# Patient Record
Sex: Female | Born: 1960
Health system: Southern US, Community
[De-identification: ages and names within clinical notes are randomized; demographics above are authoritative.]

## PROBLEM LIST (undated history)

## (undated) DIAGNOSIS — I4719 Other supraventricular tachycardia: Secondary | ICD-10-CM

## (undated) DIAGNOSIS — T7840XA Allergy, unspecified, initial encounter: Secondary | ICD-10-CM

## (undated) DIAGNOSIS — M545 Low back pain, unspecified: Secondary | ICD-10-CM

## (undated) DIAGNOSIS — E785 Hyperlipidemia, unspecified: Secondary | ICD-10-CM

## (undated) DIAGNOSIS — R739 Hyperglycemia, unspecified: Secondary | ICD-10-CM

## (undated) DIAGNOSIS — I341 Nonrheumatic mitral (valve) prolapse: Secondary | ICD-10-CM

## (undated) DIAGNOSIS — M2550 Pain in unspecified joint: Secondary | ICD-10-CM

## (undated) DIAGNOSIS — R7303 Prediabetes: Secondary | ICD-10-CM

## (undated) DIAGNOSIS — I77819 Aortic ectasia, unspecified site: Secondary | ICD-10-CM

## (undated) DIAGNOSIS — R252 Cramp and spasm: Secondary | ICD-10-CM

## (undated) DIAGNOSIS — G4730 Sleep apnea, unspecified: Secondary | ICD-10-CM

## (undated) DIAGNOSIS — I509 Heart failure, unspecified: Secondary | ICD-10-CM

## (undated) DIAGNOSIS — K219 Gastro-esophageal reflux disease without esophagitis: Secondary | ICD-10-CM

## (undated) DIAGNOSIS — R079 Chest pain, unspecified: Secondary | ICD-10-CM

## (undated) DIAGNOSIS — F32A Depression, unspecified: Secondary | ICD-10-CM

## (undated) DIAGNOSIS — I77810 Thoracic aortic ectasia: Secondary | ICD-10-CM

## (undated) DIAGNOSIS — E119 Type 2 diabetes mellitus without complications: Secondary | ICD-10-CM

## (undated) DIAGNOSIS — R0600 Dyspnea, unspecified: Secondary | ICD-10-CM

## (undated) DIAGNOSIS — M25569 Pain in unspecified knee: Secondary | ICD-10-CM

## (undated) DIAGNOSIS — F419 Anxiety disorder, unspecified: Secondary | ICD-10-CM

## (undated) DIAGNOSIS — I340 Nonrheumatic mitral (valve) insufficiency: Secondary | ICD-10-CM

## (undated) DIAGNOSIS — F329 Major depressive disorder, single episode, unspecified: Secondary | ICD-10-CM

## (undated) DIAGNOSIS — I10 Essential (primary) hypertension: Secondary | ICD-10-CM

## (undated) DIAGNOSIS — I359 Nonrheumatic aortic valve disorder, unspecified: Secondary | ICD-10-CM

## (undated) DIAGNOSIS — I272 Pulmonary hypertension, unspecified: Secondary | ICD-10-CM

## (undated) DIAGNOSIS — M1990 Unspecified osteoarthritis, unspecified site: Secondary | ICD-10-CM

## (undated) HISTORY — DX: Other supraventricular tachycardia: I47.19

## (undated) HISTORY — DX: Hyperglycemia, unspecified: R73.9

## (undated) HISTORY — PX: COLONOSCOPY: SHX174

## (undated) HISTORY — DX: Prediabetes: R73.03

## (undated) HISTORY — DX: Major depressive disorder, single episode, unspecified: F32.9

## (undated) HISTORY — PX: TUBAL LIGATION: SHX77

## (undated) HISTORY — DX: Pain in unspecified joint: M25.50

## (undated) HISTORY — DX: Low back pain: M54.5

## (undated) HISTORY — DX: Nonrheumatic mitral (valve) prolapse: I34.1

## (undated) HISTORY — PX: BREAST BIOPSY: SHX20

## (undated) HISTORY — DX: Type 2 diabetes mellitus without complications: E11.9

## (undated) HISTORY — DX: Gastro-esophageal reflux disease without esophagitis: K21.9

## (undated) HISTORY — DX: Allergy, unspecified, initial encounter: T78.40XA

## (undated) HISTORY — DX: Unspecified osteoarthritis, unspecified site: M19.90

## (undated) HISTORY — DX: Essential (primary) hypertension: I10

## (undated) HISTORY — DX: Hyperlipidemia, unspecified: E78.5

## (undated) HISTORY — DX: Aortic ectasia, unspecified site: I77.819

## (undated) HISTORY — DX: Heart failure, unspecified: I50.9

## (undated) HISTORY — DX: Anxiety disorder, unspecified: F41.9

## (undated) HISTORY — DX: Chest pain, unspecified: R07.9

## (undated) HISTORY — PX: BACK SURGERY: SHX140

## (undated) HISTORY — DX: Thoracic aortic ectasia: I77.810

## (undated) HISTORY — DX: Depression, unspecified: F32.A

## (undated) HISTORY — DX: Low back pain, unspecified: M54.50

## (undated) HISTORY — DX: Pulmonary hypertension, unspecified: I27.20

## (undated) HISTORY — DX: Pain in unspecified knee: M25.569

## (undated) HISTORY — DX: Nonrheumatic aortic valve disorder, unspecified: I35.9

## (undated) HISTORY — DX: Cramp and spasm: R25.2

## (undated) HISTORY — DX: Nonrheumatic mitral (valve) insufficiency: I34.0

---

## 1997-04-21 ENCOUNTER — Other Ambulatory Visit: Admission: RE | Admit: 1997-04-21 | Discharge: 1997-04-21 | Payer: Self-pay | Admitting: Obstetrics and Gynecology

## 1997-05-14 ENCOUNTER — Inpatient Hospital Stay (HOSPITAL_COMMUNITY): Admission: AD | Admit: 1997-05-14 | Discharge: 1997-05-14 | Payer: Self-pay | Admitting: Obstetrics & Gynecology

## 1997-05-17 ENCOUNTER — Inpatient Hospital Stay (HOSPITAL_COMMUNITY): Admission: AD | Admit: 1997-05-17 | Discharge: 1997-05-20 | Payer: Self-pay | Admitting: Obstetrics and Gynecology

## 1997-08-16 ENCOUNTER — Other Ambulatory Visit: Admission: RE | Admit: 1997-08-16 | Discharge: 1997-08-16 | Payer: Self-pay | Admitting: Obstetrics and Gynecology

## 2000-02-20 ENCOUNTER — Other Ambulatory Visit: Admission: RE | Admit: 2000-02-20 | Discharge: 2000-02-20 | Payer: Self-pay | Admitting: Obstetrics and Gynecology

## 2000-08-12 ENCOUNTER — Other Ambulatory Visit: Admission: RE | Admit: 2000-08-12 | Discharge: 2000-08-12 | Payer: Self-pay | Admitting: Obstetrics and Gynecology

## 2001-09-30 ENCOUNTER — Other Ambulatory Visit: Admission: RE | Admit: 2001-09-30 | Discharge: 2001-09-30 | Payer: Self-pay | Admitting: Obstetrics and Gynecology

## 2001-10-07 ENCOUNTER — Encounter: Payer: Self-pay | Admitting: Obstetrics and Gynecology

## 2001-10-07 ENCOUNTER — Encounter: Admission: RE | Admit: 2001-10-07 | Discharge: 2001-10-07 | Payer: Self-pay | Admitting: Obstetrics and Gynecology

## 2001-10-08 ENCOUNTER — Ambulatory Visit (HOSPITAL_COMMUNITY): Admission: RE | Admit: 2001-10-08 | Discharge: 2001-10-08 | Payer: Self-pay | Admitting: Obstetrics and Gynecology

## 2001-10-08 ENCOUNTER — Encounter: Payer: Self-pay | Admitting: Obstetrics and Gynecology

## 2002-10-09 ENCOUNTER — Encounter: Admission: RE | Admit: 2002-10-09 | Discharge: 2002-10-09 | Payer: Self-pay | Admitting: Obstetrics and Gynecology

## 2002-10-09 ENCOUNTER — Encounter: Payer: Self-pay | Admitting: Obstetrics and Gynecology

## 2003-04-07 ENCOUNTER — Other Ambulatory Visit: Admission: RE | Admit: 2003-04-07 | Discharge: 2003-04-07 | Payer: Self-pay | Admitting: Obstetrics and Gynecology

## 2003-11-05 ENCOUNTER — Encounter: Admission: RE | Admit: 2003-11-05 | Discharge: 2003-11-05 | Payer: Self-pay | Admitting: Obstetrics and Gynecology

## 2004-01-27 ENCOUNTER — Ambulatory Visit: Payer: Self-pay | Admitting: Internal Medicine

## 2004-02-10 ENCOUNTER — Ambulatory Visit: Payer: Self-pay | Admitting: Internal Medicine

## 2004-05-04 ENCOUNTER — Other Ambulatory Visit: Admission: RE | Admit: 2004-05-04 | Discharge: 2004-05-04 | Payer: Self-pay | Admitting: Obstetrics and Gynecology

## 2004-11-10 ENCOUNTER — Encounter: Admission: RE | Admit: 2004-11-10 | Discharge: 2004-11-10 | Payer: Self-pay | Admitting: Obstetrics and Gynecology

## 2004-11-17 ENCOUNTER — Ambulatory Visit: Payer: Self-pay | Admitting: Internal Medicine

## 2004-12-26 ENCOUNTER — Ambulatory Visit: Payer: Self-pay | Admitting: Internal Medicine

## 2005-01-18 ENCOUNTER — Ambulatory Visit: Payer: Self-pay | Admitting: Internal Medicine

## 2005-01-25 ENCOUNTER — Ambulatory Visit: Payer: Self-pay | Admitting: Internal Medicine

## 2005-05-08 ENCOUNTER — Other Ambulatory Visit: Admission: RE | Admit: 2005-05-08 | Discharge: 2005-05-08 | Payer: Self-pay | Admitting: Obstetrics and Gynecology

## 2005-11-07 ENCOUNTER — Emergency Department (HOSPITAL_COMMUNITY): Admission: EM | Admit: 2005-11-07 | Discharge: 2005-11-07 | Payer: Self-pay | Admitting: Family Medicine

## 2005-11-23 ENCOUNTER — Encounter: Admission: RE | Admit: 2005-11-23 | Discharge: 2005-11-23 | Payer: Self-pay | Admitting: Obstetrics and Gynecology

## 2006-04-19 ENCOUNTER — Ambulatory Visit (HOSPITAL_COMMUNITY): Admission: RE | Admit: 2006-04-19 | Discharge: 2006-04-20 | Payer: Self-pay | Admitting: Orthopedic Surgery

## 2006-09-05 DIAGNOSIS — G43909 Migraine, unspecified, not intractable, without status migrainosus: Secondary | ICD-10-CM | POA: Insufficient documentation

## 2006-09-05 DIAGNOSIS — J309 Allergic rhinitis, unspecified: Secondary | ICD-10-CM | POA: Insufficient documentation

## 2006-09-05 DIAGNOSIS — E669 Obesity, unspecified: Secondary | ICD-10-CM

## 2006-11-26 ENCOUNTER — Encounter: Admission: RE | Admit: 2006-11-26 | Discharge: 2006-11-26 | Payer: Self-pay | Admitting: Obstetrics and Gynecology

## 2006-12-02 LAB — CONVERTED CEMR LAB: Pap Smear: NORMAL

## 2007-01-13 ENCOUNTER — Ambulatory Visit: Payer: Self-pay | Admitting: Internal Medicine

## 2007-01-13 LAB — CONVERTED CEMR LAB
BUN: 7 mg/dL (ref 6–23)
Basophils Absolute: 0.1 10*3/uL (ref 0.0–0.1)
Basophils Relative: 1.4 % — ABNORMAL HIGH (ref 0.0–1.0)
Bilirubin, Direct: 0.2 mg/dL (ref 0.0–0.3)
Calcium: 9.5 mg/dL (ref 8.4–10.5)
Chloride: 106 meq/L (ref 96–112)
Cholesterol: 185 mg/dL (ref 0–200)
GFR calc Af Amer: 87 mL/min
HCT: 37.9 % (ref 36.0–46.0)
HDL: 43.2 mg/dL (ref >39.0)
Ketones, urine, test strip: NEGATIVE
LDL Cholesterol: 127 mg/dL — ABNORMAL HIGH (ref 0–99)
MCV: 87.7 fL (ref 78.0–100.0)
Monocytes Absolute: 0.3 10*3/uL (ref 0.2–0.7)
Neutro Abs: 3 10*3/uL (ref 1.4–7.7)
Neutrophils Relative %: 55.5 % (ref 43.0–77.0)
Nitrite: NEGATIVE
RBC: 4.32 M/uL (ref 3.87–5.11)
Specific Gravity, Urine: 1.025
TSH: 2.53 microintl units/mL (ref 0.35–5.50)
Total Bilirubin: 0.8 mg/dL (ref 0.3–1.2)
Total CHOL/HDL Ratio: 4.3
VLDL: 15 mg/dL (ref 0–40)
WBC: 5.4 10*3/uL (ref 4.5–10.5)
pH: 6

## 2007-01-20 ENCOUNTER — Ambulatory Visit: Payer: Self-pay | Admitting: Internal Medicine

## 2007-01-20 DIAGNOSIS — I10 Essential (primary) hypertension: Secondary | ICD-10-CM

## 2007-04-28 ENCOUNTER — Ambulatory Visit: Payer: Self-pay | Admitting: Internal Medicine

## 2007-08-25 ENCOUNTER — Ambulatory Visit: Payer: Self-pay | Admitting: Internal Medicine

## 2007-09-23 ENCOUNTER — Ambulatory Visit: Payer: Self-pay | Admitting: Internal Medicine

## 2007-09-23 DIAGNOSIS — M549 Dorsalgia, unspecified: Secondary | ICD-10-CM | POA: Insufficient documentation

## 2007-12-01 ENCOUNTER — Encounter: Admission: RE | Admit: 2007-12-01 | Discharge: 2007-12-01 | Payer: Self-pay | Admitting: Obstetrics and Gynecology

## 2007-12-09 ENCOUNTER — Telehealth: Payer: Self-pay | Admitting: Internal Medicine

## 2007-12-12 ENCOUNTER — Telehealth: Payer: Self-pay | Admitting: Internal Medicine

## 2008-02-17 ENCOUNTER — Ambulatory Visit: Payer: Self-pay | Admitting: Internal Medicine

## 2008-02-18 LAB — CONVERTED CEMR LAB
BUN: 10 mg/dL (ref 6–23)
Chloride: 109 meq/L (ref 96–112)
Glucose, Bld: 107 mg/dL — ABNORMAL HIGH (ref 70–99)
Sodium: 141 meq/L (ref 135–145)

## 2008-03-12 ENCOUNTER — Telehealth: Payer: Self-pay | Admitting: Internal Medicine

## 2008-03-23 ENCOUNTER — Ambulatory Visit: Payer: Self-pay | Admitting: Internal Medicine

## 2008-05-03 ENCOUNTER — Telehealth: Payer: Self-pay | Admitting: Internal Medicine

## 2008-08-04 ENCOUNTER — Encounter: Admission: RE | Admit: 2008-08-04 | Discharge: 2008-08-04 | Payer: Self-pay | Admitting: Interventional Radiology

## 2008-08-04 ENCOUNTER — Encounter: Payer: Self-pay | Admitting: Internal Medicine

## 2008-08-11 ENCOUNTER — Ambulatory Visit (HOSPITAL_COMMUNITY): Admission: RE | Admit: 2008-08-11 | Discharge: 2008-08-11 | Payer: Self-pay | Admitting: Interventional Radiology

## 2008-08-27 ENCOUNTER — Ambulatory Visit (HOSPITAL_COMMUNITY): Admission: RE | Admit: 2008-08-27 | Discharge: 2008-08-28 | Payer: Self-pay | Admitting: Interventional Radiology

## 2008-09-22 ENCOUNTER — Encounter: Admission: RE | Admit: 2008-09-22 | Discharge: 2008-09-22 | Payer: Self-pay | Admitting: Interventional Radiology

## 2008-12-01 ENCOUNTER — Encounter: Admission: RE | Admit: 2008-12-01 | Discharge: 2008-12-01 | Payer: Self-pay | Admitting: Obstetrics and Gynecology

## 2009-03-01 ENCOUNTER — Ambulatory Visit (HOSPITAL_COMMUNITY): Admission: RE | Admit: 2009-03-01 | Discharge: 2009-03-01 | Payer: Self-pay | Admitting: Interventional Radiology

## 2009-03-01 ENCOUNTER — Encounter: Admission: RE | Admit: 2009-03-01 | Discharge: 2009-03-01 | Payer: Self-pay | Admitting: Interventional Radiology

## 2009-10-03 ENCOUNTER — Ambulatory Visit: Payer: Self-pay | Admitting: Internal Medicine

## 2009-12-02 ENCOUNTER — Encounter: Admission: RE | Admit: 2009-12-02 | Discharge: 2009-12-02 | Payer: Self-pay | Admitting: Obstetrics and Gynecology

## 2010-01-21 ENCOUNTER — Encounter: Payer: Self-pay | Admitting: Internal Medicine

## 2010-01-22 ENCOUNTER — Encounter: Payer: Self-pay | Admitting: Obstetrics and Gynecology

## 2010-01-23 ENCOUNTER — Encounter: Payer: Self-pay | Admitting: Interventional Radiology

## 2010-01-31 NOTE — Assessment & Plan Note (Signed)
Summary: BACK PAIN // RS   Vital Signs:  Patient profile:   50 year old female Weight:      262 pounds Temp:     99.2 degrees F oral BP sitting:   140 / 100  (left arm) Cuff size:   large  Vitals Entered By: Sid Falcon LPN (October 03, 2009 11:26 AM)  History of Present Illness: LOW BACK PAIN---ONSET SEVERAL DAYS AGO yesterday pain was severe--much better today minmal r leg pain associated with LBP  no weakness she has a hx of LBP--- hx of back surgery 2008 (dr brooks)  All other systems reviewed and were negative   Current Problems (verified): 1)  Back Pain  (ICD-724.5) 2)  Elevated Blood Pressure Without Diagnosis of Hypertension  (ICD-796.2) 3)  Preventive Health Care  (ICD-V70.0) 4)  Migraine Headache  (ICD-346.90) 5)  Obesity  (ICD-278.00) 6)  Allergic Rhinitis  (ICD-477.9)  Current Medications (verified): 1)  None  Allergies (verified): No Known Drug Allergies  Past History:  Past Medical History: Last updated: 09/05/2006 Allergic rhinitis obese migraine headache SBE prophylaxis--rescued by CV  Past Surgical History: Last updated: 01/20/2007 Tubal ligation CB X1--C-section breast sx--biopsy, bilateral back surgery-4/1/8-pt's report (dr brooks).  Family History: Last updated: 01/20/2007 Family History High cholesterol-mother, brother Family History Hypertension mother , brother Family History of Stroke M 1st degree relative <50-father deceased age 86  Social History: Last updated: 03/23/2008 Married Former Smoker Alcohol use-yes works at [employer]  Risk Factors: Smoking Status: quit (01/20/2007)  Physical Exam  General:  alert and well-developed.   Head:  normocephalic and atraumatic.   Eyes:  pupils equal and pupils round.   Ears:  R ear normal and L ear normal.   Neck:  No deformities, masses, or tenderness noted. Lungs:  normal respiratory effort and no intercostal retractions.   Heart:  normal rate and regular rhythm.     Abdomen:  soft and non-tender.   Msk:  SLR negative  Neurologic:  dtrs at ankles and knees are -2/-2 but symmetric Skin:  turgor normal and color normal.   Psych:  good eye contact and not anxious appearing.     Impression & Recommendations:  Problem # 1:  BACK PAIN (ICD-724.5) suspect muscle spasm---will try NSAIDS muscle relaxer  call if sxs persist  instructions for back exercises  Complete Medication List: 1)  Meloxicam 15 Mg Tabs (Meloxicam) .... One by mouth daily with food 2)  Cyclobenzaprine Hcl 10 Mg Tabs (Cyclobenzaprine hcl) .Marland Kitchen.. 1 by mouth 2 times daily as needed for back pain  Patient Instructions: 1)  . Prescriptions: CYCLOBENZAPRINE HCL 10 MG  TABS (CYCLOBENZAPRINE HCL) 1 by mouth 2 times daily as needed for back pain  #20 x 0   Entered and Authorized by:   Birdie Sons MD   Signed by:   Birdie Sons MD on 10/03/2009   Method used:   [facility]        [facility] [address] [phone]* (retail)       [address]       [place], [place]  [address]       Ph: [phone] or [phone]       Fax: [phone]   RxID:   [id number] MELOXICAM 15 MG TABS (MELOXICAM) one by mouth daily with food  #30 x 0   Entered and Authorized by:   Birdie Sons MD   Signed by:   Birdie Sons MD  on 10/03/2009   Method used:   [facility]        [facility] [address] [phone]* (retail)       [address]       [place], [place]  [address]       Ph: [phone] or [phone]       Fax: [phone]   RxID:   [id number]

## 2010-04-08 LAB — CREATININE, SERUM
Creatinine, Ser: 0.81 mg/dL (ref 0.4–1.2)
GFR calc Af Amer: >60 mL/min (ref >60)
GFR calc non Af Amer: >60 mL/min (ref >60)

## 2010-04-08 LAB — HCG, SERUM, QUALITATIVE: Preg, Serum: NEGATIVE

## 2010-04-08 LAB — CBC
Hemoglobin: 12.6 g/dL (ref 12.0–15.0)
Platelets: 265 10*3/uL (ref 150–400)

## 2010-05-19 NOTE — Op Note (Signed)
[name], [name]                 ACCOUNT NO.:  [id number]   MEDICAL RECORD NO.:  [id number]          PATIENT TYPE:  OIB   LOCATION:  1518                         FACILITY:  [facility]   PHYSICIAN:  Alvy Beal, MD    DATE OF BIRTH:  17-May-1960   DATE OF PROCEDURE:  04/19/2006  DATE OF DISCHARGE:                               OPERATIVE REPORT   PREOPERATIVE DIAGNOSIS:  L5-S1 posterolateral right disc herniation, CPT  code 6030.   POSTOPERATIVE DIAGNOSIS:  L5-S1 posterolateral right disc herniation,  CPT code 6030.   HISTORY:  This is a very pleasant 50 year old woman who presented to the  office with complaints of severe right leg pain.  Her clinical exam was  consistent with an S1 radiculopathy and her MRI confirmed the L5-S1  right sided disc herniation.  After discussing treatment options, she  consented to surgery.  All appropriate risks, benefits, and alternatives  were discussed with the patient and her husband.  The patient was seen  in the preoperative holding area.  The back and right leg were signed by  myself as was the consent.  I then reviewed the risks, benefits, and  alternatives to the surgery with the patient, again, confirming she  signed the consent form.   OPERATIVE NOTE:  The patient was brought to the operating room and  placed supine on the operating table.  After the successful induction of  general anesthesia and endotracheal intubation, SCDs were applied.  The  patient was turned prone onto the Wilson frame.  Her arms were placed  overhead.  All bony prominences are well padded.  The back was prepped  and draped in a standard fashion.  Two 18 gauge needles were placed into  the skin and a pre-incision x-ray was taken for localization.  A 4 inch  incision was made in the midline of the spine.  Sharp dissection was  carried out down to and through the deep subcutaneous tissue.  I was  able to palpate the deep fascia.  I then incised the deep fascia with  the Bovie and then, using a Cobb elevator, stripped the paraspinous  muscles off the spinous process of L5 and S1 and exposed the L5-S1  intervertebral space.  I dissected out to the level of the facet  capsule.  I then placed a Penfield 4 underneath the lamina of L5 and  took an interoperative x-ray to confirm the appropriate level.  The  first level was at the L4 lamina, and so I extended my exposure  inferiorly and then placed the Penfield 4 at the L5 lamina.  I then took  another x-ray and confirmed I was at the L5-S1 disc space.   At this point in time, a Taylor retractor was placed onto the lateral  aspect of the facet complex and I was able to retract the soft tissue.  A high speed bur was then used to perform a laminotomy of L5 on the  right hand side.  I then used pickups and a 15 blade knife to incise the  ligamentum flavum. Once the  ligamentum flavum was incised, I used a  small curved curet to develop a plane between the ligamentum flavum and  the underlying dura.  Then, using 2 mm and 3 mm Kerrison rongeurs, I  removed the ligamentum flavum to expose the underlying epidural fat and  dura.  I then carried out this dissection laterally and just removed a  small portion of the medial aspect of the L5 inferior facet.  At this  point in time, I was able to visualize the traversing nerve root.  I  then swept the nerve root and dura medially to expose the large  posterolateral disc herniation.  At this point, I used a nerve root  retractor to protect the dura and nerve root and then removed the  fragment of disc material with a pituitary rongeur.  At this point, the  annular injury was visualized and I used a micro-pituitary rongeur to  gently insert into the actual disc space to remove any remaining free  fragments of disc material that were there.  I then swept  circumferentially with a [facility] as well as a nerve hook and  removed a second large inferior fragment of disc  material.  Once I had  removed the entire amount of disc material, I was able to sweep in a 360  degree fashion superiorly, medially, inferiorly, and laterally,  confirming that there were no further free fragments of disc material.  I then used a Public house manager to sweep out the neural foramen to insure  that the nerve root was under no ongoing pressure.   Once we had an adequate decompression and discectomy performed, I  copiously irrigated the wound with normal saline and used bipolar  electrocautery to obtain hemostasis.  To maintain it, I used FloSeal.  I  then closed the deep fascia with interrupted #1 Vicryl sutures, the  superficial with 2-0 Vicryl sutures, and the skin with 3-0 Monocryl.  Steri-Strips and a dry dressing were applied and the patient was  extubated and transferred to the PACU without incident.  At the end of  the case, all needle and sponge counts were correct.  In the PACU, the  patient was neurologically intact with no pain with straight leg  maneuvering on the right side.   The patient, after discharge, will follow up with me in the office for a  wound check in two weeks.      Alvy Beal, MD  Electronically Signed     DDB/MEDQ  D:  04/19/2006  T:  04/20/2006  Job:  [id number]

## 2010-07-02 LAB — HM PAP SMEAR

## 2010-07-31 ENCOUNTER — Encounter: Payer: Self-pay | Admitting: Internal Medicine

## 2010-08-01 ENCOUNTER — Ambulatory Visit (INDEPENDENT_AMBULATORY_CARE_PROVIDER_SITE_OTHER): Payer: BC Managed Care – PPO | Admitting: Internal Medicine

## 2010-08-01 ENCOUNTER — Encounter: Payer: Self-pay | Admitting: Internal Medicine

## 2010-08-01 VITALS — BP 138/108 | HR 76 | Temp 98.1°F | Ht 70.0 in | Wt 282.0 lb

## 2010-08-01 DIAGNOSIS — Z13220 Encounter for screening for lipoid disorders: Secondary | ICD-10-CM

## 2010-08-01 DIAGNOSIS — Z0000 Encounter for general adult medical examination without abnormal findings: Secondary | ICD-10-CM

## 2010-08-01 DIAGNOSIS — K219 Gastro-esophageal reflux disease without esophagitis: Secondary | ICD-10-CM

## 2010-08-01 DIAGNOSIS — R030 Elevated blood-pressure reading, without diagnosis of hypertension: Secondary | ICD-10-CM

## 2010-08-01 LAB — CBC WITH DIFFERENTIAL/PLATELET
Basophils Absolute: 0 10*3/uL (ref 0.0–0.1)
Eosinophils Absolute: 0.3 10*3/uL (ref 0.0–0.7)
Hemoglobin: 12.7 g/dL (ref 12.0–15.0)
MCHC: 32.9 g/dL (ref 30.0–36.0)
MCV: 89.3 fl (ref 78.0–100.0)
Monocytes Relative: 6.2 % (ref 3.0–12.0)
Neutro Abs: 3.1 10*3/uL (ref 1.4–7.7)
RBC: 4.32 Mil/uL (ref 3.87–5.11)
WBC: 5.7 10*3/uL (ref 4.5–10.5)

## 2010-08-01 LAB — HEPATIC FUNCTION PANEL
AST: 14 U/L (ref 0–37)
Albumin: 3.8 g/dL (ref 3.5–5.2)
Total Protein: 6.8 g/dL (ref 6.0–8.3)

## 2010-08-01 LAB — BASIC METABOLIC PANEL
Chloride: 107 mEq/L (ref 96–112)
Potassium: 3.7 mEq/L (ref 3.5–5.1)
Sodium: 139 mEq/L (ref 135–145)

## 2010-08-01 LAB — POCT URINALYSIS DIPSTICK
Bilirubin, UA: NEGATIVE
Glucose, UA: NEGATIVE
Leukocytes, UA: NEGATIVE
pH, UA: 7

## 2010-08-01 LAB — LIPID PANEL
Cholesterol: 160 mg/dL (ref 0–200)
HDL: 51 mg/dL (ref >39.00)
Total CHOL/HDL Ratio: 3
Triglycerides: 134 mg/dL (ref 0.0–149.0)

## 2010-08-01 MED ORDER — OMEPRAZOLE 20 MG PO CPDR: 20.0000 mg | DELAYED_RELEASE_CAPSULE | Freq: Every day | ORAL | Status: DC

## 2010-08-01 NOTE — Progress Notes (Signed)
  Subjective:    Patient ID: Jamie [name], female    DOB: 1960-10-02, 50 y.o.   MRN: [id number]  HPI cpx  New problem--gerd. She complians of 1-2 months hx of gerd sxs. "bad acid taste" associated with chest pain. Typically sxs occur at rest and nocturnal.  BP---she denies any sxs. Reviewed previous BPs---have not been this high before BP Readings from Last 3 Encounters:  08/01/10 164/104  10/03/09 140/100  03/23/08 122/80    Past Medical History  Diagnosis Date  . Allergy   . Migraine   . SBE (subacute bacterial endocarditis)    Past Surgical History  Procedure Date  . Tubal ligation   . Cesarean section   . Brain surgery     biopsy  . Back surgery     reports that she quit smoking about 14 years ago. Her smoking use included Cigarettes. She does not have any smokeless tobacco history on file. She reports that she drinks alcohol. Her drug history not on file. family history includes Hyperlipidemia in her brother and mother; Hypertension in her brother and mother; and Stroke in her father. No Known Allergies     Review of Systems  patient denies, shortness of breath, orthopnea. Denies lower extremity edema, abdominal pain, change in appetite, change in bowel movements. Patient denies rashes, musculoskeletal complaints. No other specific complaints in a complete review of systems.      Objective:   Physical Exam  Well-developed well-nourished female in no acute distress. HEENT exam atraumatic, normocephalic, extraocular muscles are intact. Neck is supple. No jugular venous distention no thyromegaly. Chest clear to auscultation without increased work of breathing. Cardiac exam S1 and S2 are regular. Abdominal exam active bowel sounds, soft, nontender, obese. Extremities no edema. Neurologic exam she is alert without any motor sensory deficits. Gait is normal.        Assessment & Plan:  Well visit Health maint utd

## 2010-08-01 NOTE — Assessment & Plan Note (Signed)
New complaint Start ppi Will check EKG -- doubt sxs are cardiac

## 2010-08-01 NOTE — Patient Instructions (Signed)
aciphex 1 daily until you run out and then start omeprazole [facility] mg daily ([facility])

## 2010-08-01 NOTE — Assessment & Plan Note (Signed)
i'm concerned with BP i've asked her to monitor BP regularly I'll see her in 4-6 weeks

## 2010-08-03 ENCOUNTER — Telehealth: Payer: Self-pay | Admitting: Internal Medicine

## 2010-08-03 NOTE — Telephone Encounter (Signed)
Requesting lab results and advise about her meds. Thanks!!!

## 2010-08-07 NOTE — Telephone Encounter (Signed)
Change to omeprazole 20 mg po qd. Send for stress only myoview...indication: chest pain with multiple cardiac risk facotrs

## 2010-08-07 NOTE — Telephone Encounter (Signed)
Pt aware, order placed 

## 2010-08-07 NOTE — Telephone Encounter (Signed)
Aciphex not working.  Wants something

## 2010-08-08 ENCOUNTER — Other Ambulatory Visit: Payer: Self-pay | Admitting: Internal Medicine

## 2010-08-09 ENCOUNTER — Ambulatory Visit (HOSPITAL_COMMUNITY): Payer: BC Managed Care – PPO | Attending: Internal Medicine | Admitting: Radiology

## 2010-08-09 DIAGNOSIS — R55 Syncope and collapse: Secondary | ICD-10-CM | POA: Insufficient documentation

## 2010-08-09 DIAGNOSIS — R9439 Abnormal result of other cardiovascular function study: Secondary | ICD-10-CM | POA: Insufficient documentation

## 2010-08-09 DIAGNOSIS — R030 Elevated blood-pressure reading, without diagnosis of hypertension: Secondary | ICD-10-CM | POA: Insufficient documentation

## 2010-08-09 DIAGNOSIS — R5383 Other fatigue: Secondary | ICD-10-CM | POA: Insufficient documentation

## 2010-08-09 DIAGNOSIS — R000 Tachycardia, unspecified: Secondary | ICD-10-CM | POA: Insufficient documentation

## 2010-08-09 DIAGNOSIS — R61 Generalized hyperhidrosis: Secondary | ICD-10-CM | POA: Insufficient documentation

## 2010-08-09 DIAGNOSIS — R0609 Other forms of dyspnea: Secondary | ICD-10-CM | POA: Insufficient documentation

## 2010-08-09 DIAGNOSIS — R079 Chest pain, unspecified: Secondary | ICD-10-CM | POA: Insufficient documentation

## 2010-08-09 DIAGNOSIS — R42 Dizziness and giddiness: Secondary | ICD-10-CM | POA: Insufficient documentation

## 2010-08-09 DIAGNOSIS — R0989 Other specified symptoms and signs involving the circulatory and respiratory systems: Secondary | ICD-10-CM | POA: Insufficient documentation

## 2010-08-09 DIAGNOSIS — R5381 Other malaise: Secondary | ICD-10-CM | POA: Insufficient documentation

## 2010-08-09 DIAGNOSIS — R0602 Shortness of breath: Secondary | ICD-10-CM | POA: Insufficient documentation

## 2010-08-09 DIAGNOSIS — Z87891 Personal history of nicotine dependence: Secondary | ICD-10-CM | POA: Insufficient documentation

## 2010-08-09 DIAGNOSIS — R110 Nausea: Secondary | ICD-10-CM | POA: Insufficient documentation

## 2010-08-09 DIAGNOSIS — R0789 Other chest pain: Secondary | ICD-10-CM

## 2010-08-09 MED ORDER — TECHNETIUM TC 99M TETROFOSMIN IV KIT
33.0000 | PACK | Freq: Once | INTRAVENOUS | Status: AC | PRN
  Administered 2010-08-09: 33 via INTRAVENOUS

## 2010-08-09 NOTE — Progress Notes (Signed)
[facility] SITE 3 NUCLEAR MED [address] [place] [address] [phone]  Cardiology Nuclear Med Study  Jamie [name] is a 50 y.o. female [id number] 1960-11-05   Nuclear Med Background Indication for Stress Test:  Evaluation for Ischemia History:  No previous documented CAD Cardiac Risk Factors: History of Smoking; New recent elevated High BP (no HTN medication)  Symptoms:  Chest Pain, Chest Pressure and Chest tightness  (last date of chest tightness 3/10 at present), Diaphoresis, Dizziness, DOE, Fatigue, Fatigue with Exertion, Light-Headedness, Nausea, Near Syncope, Palpitations, Rapid HR and SOB   Nuclear Pre-Procedure Caffeine/Decaff Intake:  None NPO After: 7:00pm   Lungs:  Clear IV 0.9% NS with Angio Cath:  20g  IV Site: R Antecubital  IV Started by:  Irean Hong, RN  Chest Size (in):  44 Cup Size: G  Height: 5\' 10"  (1.778 m)  Weight:  278 lb (126.1 kg)  BMI:  Body mass index is 39.89 kg/(m^2). Tech Comments:  n/a    Nuclear Med Study 1 or 2 day study: 2 day  Stress Test Type:  Stress  Reading MD:  Olga Millers MD Order Authorizing Provider:  Birdie Sons, MD  Resting Radionuclide: Technetium 89m Tetrofosmin  Resting Radionuclide Dose: 33 mCi   Stress Radionuclide:  Technetium 3m Tetrofosmin  Stress Radionuclide Dose: 33 mCi           Stress Protocol Rest HR: 71 Stress HR: 157  Rest BP: 127/82 Stress BP: 218/76  Exercise Time (min): 7:00 METS: 8.7   Predicted Max HR: 172 bpm % Max HR: 91.28 bpm Rate Pressure Product: 43329   Dose of Adenosine (mg):  n/a Dose of Lexiscan: n/a mg  Dose of Atropine (mg): n/a Dose of Dobutamine: n/a mcg/kg/min (at max HR)  Stress Test Technologist: Irean Hong, RN  Nuclear Technologist:  Leonia Corona, RT-N     Rest Procedure:  Myocardial perfusion imaging was performed at rest 45 minutes following the intravenous administration of Technetium 19m Tetrofosmin. Rest ECG: NSR  Stress Procedure:  The  patient exercised for 7 minutes, RPE=15.  The patient stopped due to DOE and had baseline chest tightness 3/10 since yesterday.  There were nonspecific ST-T wave changes with exercise, but no change in chest tightness 3/10. There was a marked hypertensive response to exercise. There was 5 beat VT with exercise.  Technetium 75m Tetrofosmin was injected at peak exercise and myocardial perfusion imaging was performed after a brief delay. Stress ECG: Insignificant upsloping ST segment depression.  QPS Raw Data Images:  There is a breast shadow. Stress Images:  There is decreased uptake in the inferolateral wall. Rest Images:  Normal homogeneous uptake in all areas of the myocardium. Subtraction (SDS):  These findings are consistent with mild inferolateral ischemia. Transient Ischemic Dilatation (Normal <1.22):  1.03 Lung/Heart Ratio (Normal <0.45):  .35  Quantitative Gated Spect Images QGS EDV:  102 ml QGS ESV:  35 ml QGS cine images:  NL LV Function; NL Wall Motion QGS EF: 65%  Impression Exercise Capacity:  Fair exercise capacity. BP Response:  Hypertensive blood pressure response. Clinical Symptoms:  No chest pain. ECG Impression:  Insignificant upsloping ST segment depression. Comparison with Prior Nuclear Study: No previous nuclear study performed  Overall Impression:  Abnormal stress nuclear study.    Olga Millers

## 2010-08-14 ENCOUNTER — Ambulatory Visit (HOSPITAL_COMMUNITY): Payer: BC Managed Care – PPO | Attending: Internal Medicine | Admitting: Radiology

## 2010-08-14 DIAGNOSIS — R0989 Other specified symptoms and signs involving the circulatory and respiratory systems: Secondary | ICD-10-CM

## 2010-08-14 MED ORDER — TECHNETIUM TC 99M TETROFOSMIN IV KIT
33.0000 | PACK | Freq: Once | INTRAVENOUS | Status: AC | PRN
  Administered 2010-08-14: 33 via INTRAVENOUS

## 2010-08-15 NOTE — Progress Notes (Signed)
nuc med report routed to Dr.Swords 08/15/10 Domenic Polite

## 2010-08-22 ENCOUNTER — Telehealth: Payer: Self-pay | Admitting: *Deleted

## 2010-08-22 NOTE — Telephone Encounter (Signed)
Requesting cardiolite results from last week

## 2010-08-23 ENCOUNTER — Telehealth: Payer: Self-pay

## 2010-08-23 ENCOUNTER — Other Ambulatory Visit: Payer: Self-pay | Admitting: *Deleted

## 2010-08-23 NOTE — Telephone Encounter (Signed)
Per dr swords less blood flow in heart - see cardiology- pt informed and order sent to terri and she received

## 2010-08-23 NOTE — Telephone Encounter (Signed)
Pt c/o mole on leg bleeding today. She notes that the mole was discussed at he cpe appointment and would like advise on what to do

## 2010-08-24 NOTE — Telephone Encounter (Signed)
Probably needs to be excised Refer to dr Terri Piedra

## 2010-08-24 NOTE — Telephone Encounter (Signed)
Pt declined referral to Dr. Terri Piedra and requested the office number to call if she changes her mind

## 2010-08-28 ENCOUNTER — Ambulatory Visit (INDEPENDENT_AMBULATORY_CARE_PROVIDER_SITE_OTHER): Payer: BC Managed Care – PPO | Admitting: Cardiovascular Disease

## 2010-08-28 ENCOUNTER — Encounter: Payer: Self-pay | Admitting: Cardiovascular Disease

## 2010-08-28 VITALS — BP 152/94 | HR 73 | Resp 12 | Ht 71.0 in | Wt 281.0 lb

## 2010-08-28 DIAGNOSIS — R030 Elevated blood-pressure reading, without diagnosis of hypertension: Secondary | ICD-10-CM

## 2010-08-28 DIAGNOSIS — R9431 Abnormal electrocardiogram [ECG] [EKG]: Secondary | ICD-10-CM

## 2010-08-28 DIAGNOSIS — E669 Obesity, unspecified: Secondary | ICD-10-CM

## 2010-08-28 DIAGNOSIS — K219 Gastro-esophageal reflux disease without esophagitis: Secondary | ICD-10-CM

## 2010-08-28 MED ORDER — LOSARTAN POTASSIUM 50 MG PO TABS: 50.0000 mg | ORAL_TABLET | Freq: Every day | ORAL | Status: DC

## 2010-08-28 NOTE — Progress Notes (Signed)
Obese black female referred by Swords for SSCP.  She describes indigestion.  Not totally improved with H2 blocker.  Had 2 day stress myovue.  Reviewed.  HTN response to exercise.  No ECG changes Some SSCP. Nulcear images totally normal with EF 65%.  Dont think indigestion is anginal equivalent.  Needs to be on BP med.  Will start ARB and reevaluate.  Possible cardiac CT if "indigestion persists with better BP control.  NO DM  Too much salt in diet.  Nonsmoker.  Feels palpitations when laying on left side.  No associated diaphoresis, or syncope.  Indigestion is improved.  No dysphagia history of ulcer but overweight and high cab diet likely leading to persistance  ROS: Denies fever, malais, weight loss, blurry vision, decreased visual acuity, cough, sputum, SOB, hemoptysis, pleuritic pain, palpitaitons, heartburn, abdominal pain, melena, lower extremity edema, claudication, or rash.  All other systems reviewed and negative   General: Affect appropriate Healthy:  appears stated age HEENT: normal Neck supple with no adenopathy JVP normal no bruits no thyromegaly Lungs clear with no wheezing and good diaphragmatic motion Heart:  S1/S2 no murmur,rub, gallop or click PMI normal Abdomen: benighn, BS positve, no tenderness, no AAA no bruit.  No HSM or HJR Distal pulses intact with no bruits No edema Neuro non-focal Skin warm and dry No muscular weakness  Medications Current Outpatient Prescriptions  Medication Sig Dispense Refill  . calcium carbonate (OS-CAL) 600 MG TABS Take 600 mg by mouth 2 (two) times daily with a meal.        . cyclobenzaprine (FLEXERIL) 10 MG tablet Take 10 mg by mouth 2 (two) times daily as needed.        Marland Kitchen glucosamine-chondroitin 500-400 MG tablet Take 1 tablet by mouth 3 (three) times daily.        Marland Kitchen ibuprofen (ADVIL,MOTRIN) 200 MG tablet Take 200 mg by mouth every 6 (six) hours as needed.        Marland Kitchen omeprazole (PRILOSEC) 20 MG capsule Take 1 capsule (20 mg total) by  mouth daily.  30 capsule  1  . losartan (COZAAR) 50 MG tablet Take 1 tablet (50 mg total) by mouth daily.  30 tablet  12    Allergies Review of patient's allergies indicates no known allergies.  Family History: Family History  Problem Relation Age of Onset  . Hyperlipidemia Mother   . Hypertension Mother   . Stroke Father   . Hyperlipidemia Brother   . Hypertension Brother     Social History: History   Social History  . Marital Status: Married    Spouse Name: N/A    Number of Children: N/A  . Years of Education: N/A   Occupational History  . Not on file.   Social History Main Topics  . Smoking status: Former Smoker    Types: Cigarettes    Quit date: 07/31/1996  . Smokeless tobacco: Not on file  . Alcohol Use: Yes  . Drug Use: Not on file  . Sexually Active: Not on file   Other Topics Concern  . Not on file   Social History Narrative  . No narrative on file    Electrocardiogram:  NSR normal ECG rate 73  Assessment and Plan

## 2010-08-28 NOTE — Patient Instructions (Signed)
Your physician recommends that you schedule a follow-up appointment in: 3 MONTHS  START LOSARTAN 50MG  ONCE DAILY

## 2010-08-28 NOTE — Assessment & Plan Note (Signed)
SSCP/GERD clinically more likely GI>  Needs to continue PPI and ? Endoscopy or EGD if still persistant  Will F/U heart but normal exam, normal ECG and normal myovue images

## 2010-08-28 NOTE — Assessment & Plan Note (Signed)
Start Cozaar F/U in 3 months.  Low sodium intake

## 2010-08-28 NOTE — Assessment & Plan Note (Signed)
Ok to exercise.  Low carb diet

## 2010-10-13 ENCOUNTER — Other Ambulatory Visit: Payer: Self-pay | Admitting: Internal Medicine

## 2010-10-16 ENCOUNTER — Other Ambulatory Visit: Payer: Self-pay | Admitting: Obstetrics and Gynecology

## 2010-10-16 DIAGNOSIS — Z1231 Encounter for screening mammogram for malignant neoplasm of breast: Secondary | ICD-10-CM

## 2010-11-09 ENCOUNTER — Ambulatory Visit: Payer: BC Managed Care – PPO | Admitting: Cardiovascular Disease

## 2010-12-13 ENCOUNTER — Ambulatory Visit: Payer: BC Managed Care – PPO

## 2011-01-22 ENCOUNTER — Ambulatory Visit (INDEPENDENT_AMBULATORY_CARE_PROVIDER_SITE_OTHER): Payer: BC Managed Care – PPO | Admitting: Family

## 2011-01-22 ENCOUNTER — Encounter: Payer: Self-pay | Admitting: Family

## 2011-01-22 VITALS — BP 138/96 | Temp 99.3°F | Wt 280.0 lb

## 2011-01-22 DIAGNOSIS — R05 Cough: Secondary | ICD-10-CM

## 2011-01-22 DIAGNOSIS — R51 Headache: Secondary | ICD-10-CM

## 2011-01-22 DIAGNOSIS — J069 Acute upper respiratory infection, unspecified: Secondary | ICD-10-CM

## 2011-01-22 MED ORDER — AMOXICILLIN-POT CLAVULANATE 875-125 MG PO TABS: 1.0000 | ORAL_TABLET | Freq: Two times a day (BID) | ORAL | Status: AC

## 2011-01-22 NOTE — Progress Notes (Signed)
Subjective:    Patient ID: Jamie [name], female    DOB: 10-Oct-1960, 51 y.o.   MRN: [id number]  HPI 51 year old [place] American female, patient of Dr. Waymond Cera is in today with complaints of sore throat, swollen glands, headache, sneezing, cough that's productive of green phlegm production that's been going on for 2 weeks. Her symptoms are worsening. She can take over-the-counter Mucinex with little to no relief at this point. She is a nonsmoker. Denies any sick contacts.   Review of Systems  Constitutional: Positive for fever and fatigue.  HENT: Positive for ear pain, congestion, rhinorrhea, postnasal drip and sinus pressure.   Eyes: Negative.   Respiratory: Positive for cough.   Cardiovascular: Negative.   Genitourinary: Negative.   Musculoskeletal: Negative.   Skin: Negative.   Neurological: Negative.   Hematological: Negative.   Psychiatric/Behavioral: Negative.        Past Medical History  Diagnosis Date  . Allergy   . Migraine   . SBE (subacute bacterial endocarditis)     History   Social History  . Marital Status: Married    Spouse Name: N/A    Number of Children: N/A  . Years of Education: N/A   Occupational History  . Not on file.   Social History Main Topics  . Smoking status: Former Smoker    Types: Cigarettes    Quit date: 07/31/1996  . Smokeless tobacco: Not on file  . Alcohol Use: Yes  . Drug Use: Not on file  . Sexually Active: Not on file   Other Topics Concern  . Not on file   Social History Narrative  . No narrative on file    Past Surgical History  Procedure Date  . Tubal ligation   . Cesarean section   . Brain surgery     biopsy  . Back surgery     Family History  Problem Relation Age of Onset  . Hyperlipidemia Mother   . Hypertension Mother   . Stroke Father   . Hyperlipidemia Brother   . Hypertension Brother     No Known Allergies  Current Outpatient Prescriptions on File Prior to Visit  Medication Sig Dispense Refill   . calcium carbonate (OS-CAL) 600 MG TABS Take 600 mg by mouth 2 (two) times daily with a meal.        . cyclobenzaprine (FLEXERIL) 10 MG tablet Take 10 mg by mouth 2 (two) times daily as needed.        Marland Kitchen glucosamine-chondroitin 500-400 MG tablet Take 1 tablet by mouth 3 (three) times daily.        Marland Kitchen ibuprofen (ADVIL,MOTRIN) 200 MG tablet Take 200 mg by mouth every 6 (six) hours as needed.        Marland Kitchen losartan (COZAAR) 50 MG tablet Take 1 tablet (50 mg total) by mouth daily.  30 tablet  12  . omeprazole (PRILOSEC) 20 MG capsule TAKE ONE CAPSULE BY MOUTH EVERY DAY  30 capsule  1    BP 138/96  Temp(Src) 99.3 F (37.4 C) (Oral)  Wt 280 lb (127.007 kg)chart Objective:   Physical Exam  Constitutional: She is oriented to person, place, and time. She appears well-developed and well-nourished.  HENT:  Right Ear: External ear normal.  Left Ear: External ear normal.  Mouth/Throat: Oropharynx is clear and moist.       Parents moderately red. Maxillary sinus tenderness to palpation. Right anterior cervical lymphadenopathy noted to palpation. Nasal turbinates are red and boggy.  Neck: Normal range of  motion. Neck supple.  Cardiovascular: Normal rate, regular rhythm and normal heart sounds.   Pulmonary/Chest: Effort normal and breath sounds normal.  Lymphadenopathy:    She has cervical adenopathy.  Neurological: She is alert and oriented to person, place, and time.  Skin: Skin is warm and dry.  Psychiatric: She has a normal mood and affect.          Assessment & Plan:  Assessment: Upper respiratory infection, cough  Plan: Augmentin 875 one by mouth twice a day x10 days. Coricidin HBP over-the-counter as directed. Rest. Drink plenty of fluids. Call the office if symptoms worsen or persist. Recheck as scheduled, and when necessary.

## 2011-01-22 NOTE — Patient Instructions (Signed)

## 2011-02-05 ENCOUNTER — Telehealth: Payer: Self-pay | Admitting: Cardiovascular Disease

## 2011-02-05 MED ORDER — LOSARTAN POTASSIUM 50 MG PO TABS: 50.0000 mg | ORAL_TABLET | Freq: Every day | ORAL | Status: DC

## 2011-02-05 NOTE — Telephone Encounter (Signed)
losartan 50 mg 30 day supply will cost her the same as a  90 day supply, would it called into [facility] for [facility] day supply, pt out, needed today

## 2011-03-02 ENCOUNTER — Telehealth: Payer: Self-pay | Admitting: Internal Medicine

## 2011-03-02 MED ORDER — OMEPRAZOLE 20 MG PO CPDR: 20.0000 mg | DELAYED_RELEASE_CAPSULE | Freq: Every day | ORAL | Status: DC

## 2011-03-02 NOTE — Telephone Encounter (Signed)
rx sent in electronically 

## 2011-03-02 NOTE — Telephone Encounter (Signed)
Patient called stating that she would like a 3 mth supply of her heart burn meds called into her pharmacy so that she may get it cheaper. Please advise.

## 2011-03-16 ENCOUNTER — Ambulatory Visit (INDEPENDENT_AMBULATORY_CARE_PROVIDER_SITE_OTHER): Payer: BC Managed Care – PPO | Admitting: Obstetrics and Gynecology

## 2011-03-16 DIAGNOSIS — Z01419 Encounter for gynecological examination (general) (routine) without abnormal findings: Secondary | ICD-10-CM

## 2011-03-16 DIAGNOSIS — N760 Acute vaginitis: Secondary | ICD-10-CM

## 2011-03-16 DIAGNOSIS — Z124 Encounter for screening for malignant neoplasm of cervix: Secondary | ICD-10-CM

## 2011-03-26 ENCOUNTER — Other Ambulatory Visit: Payer: Self-pay | Admitting: Internal Medicine

## 2011-03-26 MED ORDER — OMEPRAZOLE 20 MG PO CPDR: 20.0000 mg | DELAYED_RELEASE_CAPSULE | Freq: Every day | ORAL | Status: DC

## 2011-03-26 NOTE — Telephone Encounter (Signed)
Pt needs refill omeprazole 20 mg #[facility]

## 2011-03-26 NOTE — Telephone Encounter (Signed)
rx sent in electronically 

## 2011-07-09 ENCOUNTER — Telehealth: Payer: Self-pay | Admitting: Internal Medicine

## 2011-07-09 NOTE — Telephone Encounter (Signed)
Caller: Shantese/Patient; PCP: Birdie Sons; CB#: [phone];  Call regarding Hypertension; Onset 07/08/11.  Afebrile.  Noted pitting ankle edema in past 2 wks. Intermittent headaches and mild lightheadedness.  [facility] and 180/105 at [facility] 07/08/11.  LMP:07/05/11.  BTL.  Reports spots and blurred vision X 2 weeks.  Advised to go to ED now for new onset of visual disturbance per Diagnosed or Suspected HTN Guideline.  [place]; advised to go to [facility] ED now.

## 2011-07-17 ENCOUNTER — Encounter: Payer: Self-pay | Admitting: Internal Medicine

## 2011-07-17 ENCOUNTER — Ambulatory Visit (INDEPENDENT_AMBULATORY_CARE_PROVIDER_SITE_OTHER): Payer: BC Managed Care – PPO | Admitting: Internal Medicine

## 2011-07-17 VITALS — BP 134/90 | HR 70 | Temp 97.6°F | Wt 279.0 lb

## 2011-07-17 DIAGNOSIS — I10 Essential (primary) hypertension: Secondary | ICD-10-CM

## 2011-07-17 MED ORDER — LOSARTAN POTASSIUM 50 MG PO TABS: 100.0000 mg | ORAL_TABLET | Freq: Every day | ORAL | Status: DC

## 2011-07-17 NOTE — Progress Notes (Signed)
Patient ID: Jamie [name], female   DOB: [birth year], 51 y.o.   MRN: [id number]  [facility]. BP range from 140-180/90-110.  She was given triamteren/hctz-- had significant leg cramping-- still trying to use the medication.  Back Pain  Past Medical History  Diagnosis Date  . Allergy   . Migraine   . SBE (subacute bacterial endocarditis)     History   Social History  . Marital Status: Married    Spouse Name: N/A    Number of Children: N/A  . Years of Education: N/A   Occupational History  . Not on file.   Social History Main Topics  . Smoking status: Former Smoker    Types: Cigarettes    Quit date: 07/31/1996  . Smokeless tobacco: Not on file  . Alcohol Use: Yes  . Drug Use: Not on file  . Sexually Active: Not on file   Other Topics Concern  . Not on file   Social History Narrative  . No narrative on file    Past Surgical History  Procedure Date  . Tubal ligation   . Cesarean section   . Brain surgery     biopsy  . Back surgery     Family History  Problem Relation Age of Onset  . Hyperlipidemia Mother   . Hypertension Mother   . Stroke Father   . Hyperlipidemia Brother   . Hypertension Brother     No Known Allergies  Current Outpatient Prescriptions on File Prior to Visit  Medication Sig Dispense Refill  . calcium carbonate (OS-CAL) 600 MG TABS Take 600 mg by mouth 2 (two) times daily with a meal.        . glucosamine-chondroitin 500-400 MG tablet Take 1 tablet by mouth 3 (three) times daily.        Marland Kitchen losartan (COZAAR) 50 MG tablet Take 1 tablet (50 mg total) by mouth daily.  90 tablet  3  . omeprazole (PRILOSEC) 20 MG capsule Take 1 capsule (20 mg total) by mouth daily.  90 capsule  1  . triamterene-hydrochlorothiazide (MAXZIDE) 75-50 MG per tablet Take 1 tablet by mouth daily.         patient denies chest pain, shortness of breath, orthopnea. Denies lower extremity edema, abdominal pain, change in appetite, change in bowel  movements. Patient denies rashes, musculoskeletal complaints. No other specific complaints in a complete review of systems.   BP 126/82  Pulse 70  Temp 97.6 F (36.4 C) (Oral)  Wt 279 lb (126.554 kg)  Well-developed well-nourished female in no acute distress. HEENT exam atraumatic, normocephalic, extraocular muscles are intact. Neck is supple. No jugular venous distention no thyromegaly. Chest clear to auscultation without increased work of breathing. Cardiac exam S1 and S2 are regular. Abdominal exam active bowel sounds, soft, nontender. Extremities no edema.

## 2011-07-17 NOTE — Assessment & Plan Note (Signed)
She has side effects with triam/hctz-- will stop  Increase losartan to 100 mg

## 2011-07-24 ENCOUNTER — Ambulatory Visit (INDEPENDENT_AMBULATORY_CARE_PROVIDER_SITE_OTHER): Payer: BC Managed Care – PPO | Admitting: Internal Medicine

## 2011-07-24 VITALS — BP 162/108

## 2011-07-24 DIAGNOSIS — I10 Essential (primary) hypertension: Secondary | ICD-10-CM

## 2011-07-24 MED ORDER — AMLODIPINE BESYLATE 5 MG PO TABS: 5.0000 mg | ORAL_TABLET | Freq: Every day | ORAL | Status: DC

## 2011-07-24 MED ORDER — LOSARTAN POTASSIUM 50 MG PO TABS: 100.0000 mg | ORAL_TABLET | Freq: Every day | ORAL | Status: DC

## 2011-07-24 NOTE — Patient Instructions (Signed)
Schedule 1 month BP check with nurse

## 2011-08-17 ENCOUNTER — Ambulatory Visit
Admission: RE | Admit: 2011-08-17 | Discharge: 2011-08-17 | Disposition: A | Discharge status: Home / Self Care | Payer: BC Managed Care – PPO | Source: Ambulatory Visit | Attending: Obstetrics and Gynecology | Admitting: Obstetrics and Gynecology

## 2011-08-17 DIAGNOSIS — Z1231 Encounter for screening mammogram for malignant neoplasm of breast: Secondary | ICD-10-CM

## 2011-08-24 ENCOUNTER — Ambulatory Visit (INDEPENDENT_AMBULATORY_CARE_PROVIDER_SITE_OTHER): Payer: BC Managed Care – PPO | Admitting: Internal Medicine

## 2011-08-24 VITALS — BP 146/102 | Wt 277.0 lb

## 2011-08-24 DIAGNOSIS — I10 Essential (primary) hypertension: Secondary | ICD-10-CM

## 2011-09-07 ENCOUNTER — Ambulatory Visit: Payer: BC Managed Care – PPO | Admitting: *Deleted

## 2011-09-11 ENCOUNTER — Ambulatory Visit: Payer: BC Managed Care – PPO | Admitting: Internal Medicine

## 2011-09-11 ENCOUNTER — Ambulatory Visit (INDEPENDENT_AMBULATORY_CARE_PROVIDER_SITE_OTHER): Payer: BC Managed Care – PPO | Admitting: *Deleted

## 2011-09-11 VITALS — BP 166/92

## 2011-09-11 DIAGNOSIS — I10 Essential (primary) hypertension: Secondary | ICD-10-CM

## 2011-09-11 MED ORDER — HYDROCHLOROTHIAZIDE 25 MG PO TABS: 25.0000 mg | ORAL_TABLET | Freq: Every day | ORAL | Status: DC

## 2011-09-11 NOTE — Progress Notes (Signed)
Patient came in today for a bp check.  Note forwarded to Dr Cato Mulligan for recommendations.

## 2011-09-11 NOTE — Progress Notes (Signed)
Call patient-- needs more medications-- start taking hctz 25 mg po qd Recheck BP in one month

## 2011-09-11 NOTE — Addendum Note (Signed)
Addended by: Alfred Levins D on: 09/11/2011 02:13 PM   Modules accepted: Orders

## 2011-09-13 ENCOUNTER — Telehealth: Payer: Self-pay | Admitting: Internal Medicine

## 2011-09-13 NOTE — Telephone Encounter (Signed)
Caller: Aerie/Patient; Phone: [phone]; Reason for Call: Patient was seen in the office on Tuesday, states she spoke with the nurse about getting something for back pain.  Calling today to see if the nurse had a chance to speak with Dr.  Cato Mulligan about prescribing something for the pain.  Please call her back.  Thanks

## 2011-09-13 NOTE — Telephone Encounter (Signed)
Pls advise.  

## 2011-09-14 NOTE — Telephone Encounter (Signed)
Recommend, tylenol 650 mg po with each meal If that does not work then refer to PT

## 2011-09-17 NOTE — Telephone Encounter (Signed)
L/m on cell phone with Dr Cato Mulligan instructions

## 2011-09-25 ENCOUNTER — Telehealth: Payer: Self-pay | Admitting: Internal Medicine

## 2011-09-25 MED ORDER — AMLODIPINE BESYLATE 5 MG PO TABS: 10.0000 mg | ORAL_TABLET | Freq: Every day | ORAL | Status: DC

## 2011-09-25 NOTE — Telephone Encounter (Signed)
On med list it states to take bid so I sent in new rx electronically

## 2011-09-25 NOTE — Telephone Encounter (Signed)
Caller: Davonda/Patient; Patient Name: Jamie [name]; PCP: Birdie Sons (Adults only); Best Callback Phone Number: [phone].  Patient calling about prescription refill for Amlodipine 5mg ; states she is out.  States was told to double up on her tablets, and that made her run out early and pharmacy cannot fill more until new Rx written by MD.  States is to take two 5mg  tabs daily.   [facility] [address] May reach patient at [phone].

## 2011-10-04 ENCOUNTER — Encounter: Payer: Self-pay | Admitting: Family Medicine

## 2011-10-04 ENCOUNTER — Ambulatory Visit (INDEPENDENT_AMBULATORY_CARE_PROVIDER_SITE_OTHER): Payer: BC Managed Care – PPO | Admitting: Family Medicine

## 2011-10-04 ENCOUNTER — Telehealth: Payer: Self-pay | Admitting: Internal Medicine

## 2011-10-04 VITALS — BP 118/78 | Temp 98.7°F | Wt 276.0 lb

## 2011-10-04 DIAGNOSIS — G43909 Migraine, unspecified, not intractable, without status migrainosus: Secondary | ICD-10-CM

## 2011-10-04 DIAGNOSIS — I10 Essential (primary) hypertension: Secondary | ICD-10-CM

## 2011-10-04 NOTE — Telephone Encounter (Signed)
Pt req to change pcp from Dr Cato Mulligan to Dr. Selena Batten, due to avail. Pls advise if ok?

## 2011-10-04 NOTE — Telephone Encounter (Signed)
Ok with me if ok with PCP - though I did tell pt at her visit when she asked about this that my visit times will likely be just as short and possibly as limited as Dr. Cato Mulligan in the future once practice is established.

## 2011-10-04 NOTE — Telephone Encounter (Signed)
Ok with me 

## 2011-10-04 NOTE — Progress Notes (Addendum)
Chief Complaint  Patient presents with  . bp check and dizzy    HPI:   Here for concerns about blood pressure. -takes medications per med list - recent changes to BP meds per review of notes and per pt taking 10mg  norvasc, 100mg  daily, HCTZ 25 mg daily - her for recheck -for about 1 week does notice occ lightheaded feeling when she stands since changing medications -denies: CP, palpations, SOB, change in headache, swelling, vision changes  Migraines: -long hx of headaches and migraines -has had one of her headaches last few days -wonders if should keep taking motrin daily as she has been doing for years -feels like this is not helping much -headaches have not changed    ROS: See pertinent positives and negatives per HPI.  Past Medical History  Diagnosis Date  . Allergy   . Migraine   . SBE (subacute bacterial endocarditis)     Family History  Problem Relation Age of Onset  . Hyperlipidemia Mother   . Hypertension Mother   . Stroke Father   . Hyperlipidemia Brother   . Hypertension Brother     History   Social History  . Marital Status: Married    Spouse Name: N/A    Number of Children: N/A  . Years of Education: N/A   Social History Main Topics  . Smoking status: Former Smoker    Types: Cigarettes    Quit date: 07/31/1996  . Smokeless tobacco: None  . Alcohol Use: Yes  . Drug Use: None  . Sexually Active: None   Other Topics Concern  . None   Social History Narrative  . None    Current outpatient prescriptions:amLODipine (NORVASC) 5 MG tablet, Take 2 tablets (10 mg total) by mouth daily., Disp: 60 tablet, Rfl: 5;  calcium carbonate (OS-CAL) 600 MG TABS, Take 600 mg by mouth 2 (two) times daily with a meal.  , Disp: , Rfl: ;  glucosamine-chondroitin 500-400 MG tablet, Take 1 tablet by mouth 3 (three) times daily.  , Disp: , Rfl:  hydrochlorothiazide (HYDRODIURIL) 25 MG tablet, Take 1 tablet (25 mg total) by mouth daily., Disp: 90 tablet, Rfl: 1;   losartan (COZAAR) 50 MG tablet, Take 2 tablets (100 mg total) by mouth daily., Disp: 180 tablet, Rfl: 3;  omeprazole (PRILOSEC) 20 MG capsule, Take 1 capsule (20 mg total) by mouth daily., Disp: 90 capsule, Rfl: 1  EXAM:  Filed Vitals:   10/04/11 0807  BP: 118/78  Temp: 98.7 F (37.1 C)    There is no height on file to calculate BMI.  GENERAL: vitals reviewed and listed below, alert, oriented, appears well hydrated and in no acute distress  HEENT: atraumatic, conjucntiva clear, no obvious abnormalities on inspection of external nose and ears  NECK: no masses on inspection  LUNGS: clear to auscultation bilaterally, no wheezes, rales or rhonchi, good air movement  CV: HRRR, tr peripheral edema  MS: moves all extremities without noticeable abnormality  PSYCH: pleasant and cooperative, no obvious depression or anxiety  NEURO: CN II-XII grossly intact  ASSESSMENT AND PLAN:  Discussed the following assessment and plan:  1. MIGRAINE HEADACHE   2. Hypertension   Suggested nightime dosing of some BP medications if continued lightheadedness with standing. Discussed chronic daily rebound headaches associated with frequent analgesic use and treatments for frequent migraines/tension headaches. No alarm symptoms. Headaches chronically with no change. Opted for options below for now with follow up. >25 minutes spent in face to face counseling with this patient.  No orders of the defined types were placed in this encounter.    Patient Instructions  Continue blood pressure medications and be careful when standing up suddenly. Please see your doctor if you continue to have any trouble with feeling lightheaded in 2-3 weeks.  For your headaches: -please try to wean off of any types of pain medications such as motrin (do not take more then once per week) -use tiger balm or topical over the counter cream or balm that contains menthol instead - do not get in eyes -think about the  medications we discussed for migraine prophylaxis -you can try 1-5mg  of melatonin nightly or butterbur (only the type that is PA free - petadolex start at 50mg  twice daily - can go up to 10mg  twice daily)  Follow up in 1 month    Return to clinic immediately if symptoms worsen or persist or new concerns.  Return in about 1 month (around 11/04/2011) for headaches and HTN.  KIM, HANNAH R.

## 2011-10-04 NOTE — Patient Instructions (Signed)
Continue blood pressure medications and be careful when standing up suddenly. Please see your doctor if you continue to have any trouble with feeling lightheaded in 2-3 weeks.  For your headaches: -please try to wean off of any types of pain medications such as motrin (do not take more then once per week) -use tiger balm or topical over the counter cream or balm that contains menthol instead - do not get in eyes -think about the medications we discussed for migraine prophylaxis -you can try 1-5mg  of melatonin nightly or butterbur (only the type that is PA free - petadolex start at 50mg  twice daily - can go up to 10mg  twice daily)  Follow up in 1 month

## 2011-10-05 NOTE — Telephone Encounter (Signed)
Jamie [name], please see notes below. OK to change PCPC and schedule.

## 2011-10-05 NOTE — Telephone Encounter (Signed)
Called pt and notified her that both drs have agreed to pcp change. Pt coming in for ov with Dr Selena Batten on 11/05/11.

## 2011-10-05 NOTE — Telephone Encounter (Signed)
Please change the appropriate things

## 2011-11-05 ENCOUNTER — Ambulatory Visit (INDEPENDENT_AMBULATORY_CARE_PROVIDER_SITE_OTHER): Payer: BC Managed Care – PPO | Admitting: Family Medicine

## 2011-11-05 ENCOUNTER — Encounter: Payer: Self-pay | Admitting: Family Medicine

## 2011-11-05 ENCOUNTER — Telehealth: Payer: Self-pay | Admitting: Family Medicine

## 2011-11-05 ENCOUNTER — Ambulatory Visit: Payer: BC Managed Care – PPO | Admitting: Family Medicine

## 2011-11-05 VITALS — BP 112/80 | Temp 98.7°F | Ht 71.0 in | Wt 274.0 lb

## 2011-11-05 DIAGNOSIS — I10 Essential (primary) hypertension: Secondary | ICD-10-CM

## 2011-11-05 DIAGNOSIS — Z131 Encounter for screening for diabetes mellitus: Secondary | ICD-10-CM

## 2011-11-05 DIAGNOSIS — G43909 Migraine, unspecified, not intractable, without status migrainosus: Secondary | ICD-10-CM

## 2011-11-05 DIAGNOSIS — K219 Gastro-esophageal reflux disease without esophagitis: Secondary | ICD-10-CM

## 2011-11-05 DIAGNOSIS — Z13220 Encounter for screening for lipoid disorders: Secondary | ICD-10-CM

## 2011-11-05 LAB — BASIC METABOLIC PANEL
BUN: 13 mg/dL (ref 6–23)
Chloride: 103 mEq/L (ref 96–112)
Creatinine, Ser: 1 mg/dL (ref 0.4–1.2)

## 2011-11-05 LAB — LIPID PANEL
HDL: 46.6 mg/dL (ref >39.00)
Total CHOL/HDL Ratio: 4
Triglycerides: 127 mg/dL (ref 0.0–149.0)
VLDL: 25.4 mg/dL (ref 0.0–40.0)

## 2011-11-05 MED ORDER — OMEPRAZOLE 20 MG PO CPDR: 20.0000 mg | DELAYED_RELEASE_CAPSULE | Freq: Every day | ORAL | Status: DC

## 2011-11-05 NOTE — Telephone Encounter (Signed)
Labs look ok. Potassium is very mildly low.  Eat foods rich in potassium daily (white beans, leafy greens, orange juice, mushrooms, avocados, sweet potatoes, bananas, dried apricots).  Follow up with me as scheduled.

## 2011-11-05 NOTE — Progress Notes (Addendum)
Chief Complaint  Patient presents with  . Transfer/ Est Care    HPI: Jamie [name] is here to establish care.  Has the following chronic problems and concerns today:  HTN: -on amlodipine 5, HCTZ 25, losartan 50 per med list -stable -denies CP, SOB, Vision Changes, swelling, palpitations  GERD: -takes omeprazole 20 mg daily - ran out several months ago -occ symptoms off this medication which are relieved with tums  Migraines/possible analgesic related daily HAs: -seen 10/04/11, weaning off analgesics -taking motrin almost daily  Chronic Low Back Pain: -had disc surgery 4-5 years ago -only flares up occ and when it does she takes motrin -has HEP   Other Providers: -GYN, Dr. Pennie [name] - goes to her for yearly exams -    ROS: See pertinent positives and negatives per HPI.  Past Medical History  Diagnosis Date  . Allergy   . Migraine   . SBE (subacute bacterial endocarditis)     Family History  Problem Relation Age of Onset  . Hyperlipidemia Mother   . Hypertension Mother   . Stroke Father   . Hyperlipidemia Brother   . Hypertension Brother     History   Social History  . Marital Status: Married    Spouse Name: N/A    Number of Children: N/A  . Years of Education: N/A   Social History Main Topics  . Smoking status: Former Smoker    Types: Cigarettes    Quit date: 07/31/1996  . Smokeless tobacco: None  . Alcohol Use: Yes  . Drug Use: None  . Sexually Active: None   Other Topics Concern  . None   Social History Narrative  . None    Current outpatient prescriptions:amLODipine (NORVASC) 5 MG tablet, Take 2 tablets (10 mg total) by mouth daily., Disp: 60 tablet, Rfl: 5;  calcium carbonate (OS-CAL) 600 MG TABS, Take 600 mg by mouth 2 (two) times daily with a meal.  , Disp: , Rfl: ;  glucosamine-chondroitin 500-400 MG tablet, Take 1 tablet by mouth 3 (three) times daily.  , Disp: , Rfl:  hydrochlorothiazide (HYDRODIURIL) 25 MG tablet, Take 1 tablet (25 mg  total) by mouth daily., Disp: 90 tablet, Rfl: 1;  losartan (COZAAR) 50 MG tablet, Take 2 tablets (100 mg total) by mouth daily., Disp: 180 tablet, Rfl: 3  EXAM:  Filed Vitals:   11/05/11 0811  BP: 112/80  Temp: 98.7 F (37.1 C)    Body mass index is 38.22 kg/(m^2).  GENERAL: vitals reviewed and listed above, alert, oriented, appears well hydrated and in no acute distress  HEENT: atraumatic, conjunttiva clear, no obvious abnormalities on inspection of external nose and ears  NECK: no obvious masses on inspection  LUNGS: clear to auscultation bilaterally, no wheezes, rales or rhonchi, good air movement  CV: HRRR, no peripheral edema  MS: moves all extremities without noticeable abnormality  PSYCH: pleasant and cooperative, no obvious depression or anxiety  ASSESSMENT AND PLAN:  Discussed the following assessment and plan:  1. Hypertension  BMP with eGFR  2. GERD (gastroesophageal reflux disease)    3. MIGRAINE HEADACHE    4. Screening for diabetes mellitus  Hemoglobin A1c  5. Screening for hyperlipidemia  Lipid Panel   -We reviewed the PMH, PSH, FH, SH, Meds and Allergies. -GERD improved - advised DC prilosec -We provided refills for any medications we will prescribe as needed. -We addressed current concerns per orders and patient instructions. -We have asked for records for pertinent exams, studies, vaccines and  notes from previous providers. -We have advised patient to follow up per instructions below. -Influenza vaccine given today  -Patient advised to return or notify a doctor immediately if symptoms worsen or persist or new concerns arise.  Patient Instructions   -use heat and topical sport creams (biofreeze) for intermittent back pain - try to use ibuprofen as sparingly as possible due to potential adverse effects we talked about  -stop the prilosec  -We have ordered labs or studies at this visit. It can take up to 1-2 weeks for results and processing. We will  contact you with instructions IF your results are abnormal. Normal results will be released to your [facility]. If you have not heard from [place] or can not find your results in [facility] in 2 weeks please contact our office.  -PLEASE SIGN UP FOR MYCHART TODAY   We recommend the following healthy lifestyle measures: - eat a healthy diet consisting of lots of vegetables, fruits, beans, nuts, seeds, healthy meats such as white chicken and fish and whole grains.  - avoid fried foods, fast food, processed foods, sodas, red meet and other fattening foods.  - get a least 150 minutes of aerobic exercise per week.   Follow up in: 2-3 months      Jamie [name], Jamie [name].  ADDENDUM: from phone notes and discussion with CNA whom has spoken with pt seems pt had been taking 10mg  of her norvasc (2 tablets) daily for a long time. However, this was never updated on her med list.

## 2011-11-05 NOTE — Addendum Note (Signed)
Addended by: Rita Ohara R on: 11/05/2011 08:41 AM   Modules accepted: Orders

## 2011-11-05 NOTE — Patient Instructions (Addendum)
-  use heat and topical sport creams (biofreeze) for intermittent back pain - try to use ibuprofen as sparingly as possible due to potential adverse effects we talked about  -stop the prilosec  -We have ordered labs or studies at this visit. It can take up to 1-2 weeks for results and processing. We will contact you with instructions IF your results are abnormal. Normal results will be released to your [facility]. If you have not heard from [place] or can not find your results in [facility] in 2 weeks please contact our office.  -PLEASE SIGN UP FOR MYCHART TODAY   We recommend the following healthy lifestyle measures: - eat a healthy diet consisting of lots of vegetables, fruits, beans, nuts, seeds, healthy meats such as white chicken and fish and whole grains.  - avoid fried foods, fast food, processed foods, sodas, red meet and other fattening foods.  - get a least 150 minutes of aerobic exercise per week.   Follow up in: 2-3 months

## 2011-11-19 ENCOUNTER — Other Ambulatory Visit: Payer: Self-pay | Admitting: Family Medicine

## 2011-11-19 MED ORDER — AMLODIPINE BESYLATE 5 MG PO TABS: 10.0000 mg | ORAL_TABLET | Freq: Every day | ORAL | Status: DC

## 2011-11-19 MED ORDER — LOSARTAN POTASSIUM 50 MG PO TABS: 100.0000 mg | ORAL_TABLET | Freq: Every day | ORAL | Status: DC

## 2011-11-19 NOTE — Telephone Encounter (Signed)
Patient calling to get all of her blood pressure medication refilled. She would prefer to get this in a 90 day supply if she is able to. Please use pharmacy on file for patient. Contact her if you have any questions. Thanks

## 2011-11-19 NOTE — Telephone Encounter (Signed)
Rx sent to pharmacy for 90 day supply.  

## 2011-11-22 ENCOUNTER — Telehealth: Payer: Self-pay | Admitting: Family Medicine

## 2011-11-22 MED ORDER — AMLODIPINE BESYLATE 5 MG PO TABS: 5.0000 mg | ORAL_TABLET | Freq: Every day | ORAL | Status: DC

## 2011-11-22 NOTE — Telephone Encounter (Signed)
Called pharmacy and spoke with pharmacist and pt has been on one tablet daily.  Rx sent to pharmacy for qd.

## 2011-11-22 NOTE — Telephone Encounter (Signed)
Jamie [name] is calling in regarding her amLODipine (NORVASC) 5 MG tablet 180 tablet 1 11/19/2011 11/18/2012 Sig - Route: Take 2 tablets (10 mg total) by mouth daily. - Oral.  She states she is requesting 90 day supply. Her pharmacy called her and told her she cannot get it refilled until 12/01/11, due to the way the script was written.  Patient states it does not appear to the pharmacy she is taking the medication "twice daily" and is over dosing.  She needs someone to contact her pharmacy and confirm script.  90 days supply is $15.00 (same as thirty day supply).  [facility] [address]. PLEASE CONTACT PHARMACY TO CONFIRM PATIENT SCRIPT AND 90 DAYS SUPPLY.

## 2011-11-22 NOTE — Telephone Encounter (Signed)
Called and left a message that rx had been corrected.

## 2011-11-23 ENCOUNTER — Other Ambulatory Visit: Payer: Self-pay | Admitting: Family Medicine

## 2011-11-23 DIAGNOSIS — I10 Essential (primary) hypertension: Secondary | ICD-10-CM

## 2011-11-23 MED ORDER — HYDROCHLOROTHIAZIDE 25 MG PO TABS: 25.0000 mg | ORAL_TABLET | Freq: Every day | ORAL | Status: DC

## 2011-11-23 MED ORDER — AMLODIPINE BESYLATE 10 MG PO TABS: 10.0000 mg | ORAL_TABLET | Freq: Every day | ORAL | Status: DC

## 2011-11-23 NOTE — Telephone Encounter (Signed)
Called the pharmacy and spoke with Caryn Bee and he states the losartan was ready for pick.  Pharmacy states that pt had norvasc on 09/29/11 and is not due until the 30th.  On the hctz pt was last given this on 09/11/11 and is not due at this time.   Per patient Dr.Swords had pt on two norvasc a day.  Pt states she is having symptoms of blurred vision and headaches.  Pt states she has been out of medication since 1.5 weeks going on 2 weeks.  Pt states he hctz was cancelled out because pt was given the wrong medication by the pharmacy.  Pt states she was given back a refund and told pt she could be given the correct medication when she needed it.  Pt states she currently has 3 more days of this medication.    Advised Dr. Selena Batten of what is going on and she advises to call the pharmacy back.

## 2011-11-23 NOTE — Telephone Encounter (Signed)
Seems there was a mistake on med list in the past and not updated. Changed to 10mg  tablets of norvasc and sent to pharmacy. They are filling Norvasc 10mg  to take once daily - please let her know!  Patient is saying she picked up HCTZ but when the pills looked different she returned them to the pharmacy and was given a refund.   I called pharmacy. Pharmacy reports HCTZ and losartan are ready to pick up.   Please let her know - if HAs are same as those going on for months more her chronic daily HAs. If she is worried HAs are acute different she needs to be seen. [facility] in am or urgent care or ED.  I am unsure why patient did not mention any of this at her appointment.

## 2011-11-23 NOTE — Telephone Encounter (Signed)
Ok to refill to all see office visit note for current meds.

## 2011-11-23 NOTE — Addendum Note (Signed)
Addended by: Azucena Freed on: 11/23/2011 04:11 PM   Modules accepted: Orders

## 2011-11-23 NOTE — Telephone Encounter (Signed)
Called the pharmacy back and spoke with Nettie Elm who has sent pt's most recent medication list and history faxed to office.

## 2011-11-23 NOTE — Telephone Encounter (Signed)
Pt was calling to get a refill on her BP medications and diuretic.  Per EPIC, Cozaar and Norvasc were recently refilled.  [phone] that said they did not have any RX information.  RN gave RX directions per EPIC to [place], [place].  Pt states she also needs the Hydrodiuril refilled.  Pt requests 90 supply for insurance reasons.  OFFICE PLEASE FOLLOW UP WITH PRESCRIPTION REFILL REQUEST.  TY

## 2011-11-23 NOTE — Telephone Encounter (Signed)
Called and spoke with pt and pt is aware.  Pt states she will go the ED or urgent if needed but she has been going through this for some time and she can take a lot of pain.  Advised pt to come in for an ov next week if she is not feeling better.  Pt states she understands.  Pt is aware and understands to take 1 tablet of Norvasc 10 mg.  Pt aware all 3 rx are ready for pick up at pharmacy.

## 2011-11-23 NOTE — Addendum Note (Signed)
Addended by: Terressa Koyanagi on: 11/23/2011 05:01 PM   Modules accepted: Orders

## 2011-12-06 ENCOUNTER — Telehealth: Payer: Self-pay | Admitting: Family Medicine

## 2011-12-06 ENCOUNTER — Other Ambulatory Visit: Payer: Self-pay | Admitting: Family Medicine

## 2011-12-06 NOTE — Telephone Encounter (Signed)
[facility] Triage Call Report Triage Record Num: [id number] Operator: Albertine Grates Patient Name: Jamie [name] Call Date & Time: 12/05/2011 5:04:40PM Patient Phone: [phone] PCP: Evelena Peat Patient Gender: Female PCP Fax : [phone] Patient DOB: 1960-06-26 [facility] Name: [facility] Reason for Call: Caller: [name]; PCP: Kriste Basque (Family Practice); CB#: [phone]; States MD advised to wean off "heartburn" medicine 1 month ago. Has had indigestion since 11-30 and is wanting MD to refill medicine. Denies burning in chest but has intermittent "gas pain" in chest and back. Wants MD to prescribe medicine. Medicine not listed in EPIC. Advised to follow up with MD 12-5. Protocol(s) Used: Heartburn Recommended Outcome per Protocol: See Provider within 24 hours Reason for Outcome: Regularly taking nonsteroidal anti-inflammatory medications for other conditions Care Advice: Do not take aspirin, ibuprofen, ketoprofen, naproxen, etc., or other pain relieving medications until consulting with provider. ~ Heartburn Relief (Dietary): - Avoid overeating. - Eat smaller, more frequent meals and chew each bite thoroughly. - Avoid high fat, spicy or gas-producing foods. - Limit liquids/foods that are gastric irritants (fruit juices, caffeinated, carbonated or alcoholic beverages, chocolate and dairy products). - Eat at least three hours before bedtime. ~ 12/

## 2011-12-12 NOTE — Telephone Encounter (Signed)
Pt needs refill on PRILOSEC, NOT the losartan.. Pt has heartburn. Used to take, but needs to go on again.  [facility] [address]

## 2011-12-14 ENCOUNTER — Encounter: Payer: Self-pay | Admitting: Family Medicine

## 2011-12-14 ENCOUNTER — Ambulatory Visit (INDEPENDENT_AMBULATORY_CARE_PROVIDER_SITE_OTHER): Payer: BC Managed Care – PPO | Admitting: Family Medicine

## 2011-12-14 VITALS — BP 120/90 | HR 83

## 2011-12-14 DIAGNOSIS — M545 Low back pain: Secondary | ICD-10-CM

## 2011-12-14 MED ORDER — TRAMADOL HCL 50 MG PO TABS: 50.0000 mg | ORAL_TABLET | Freq: Three times a day (TID) | ORAL | Status: DC | PRN

## 2011-12-14 MED ORDER — CYCLOBENZAPRINE HCL 5 MG PO TABS: 5.0000 mg | ORAL_TABLET | Freq: Three times a day (TID) | ORAL | Status: DC | PRN

## 2011-12-14 NOTE — Patient Instructions (Signed)
-  heat for 15 minutes 2-3 times dialy  -topical sports creams can help  -use pain medication and muscle relaxer only as proscribed as needed  -home exercise program

## 2011-12-14 NOTE — Progress Notes (Signed)
Chief Complaint  Patient presents with  . back thrown out    HPI:  Acute visit for Back Pain: -started yesterday -overdid it yesterday bending and lifting a bunch at work -now with pain in R low back, mod-severe sharp pain -worse with standing and certain movements, better a little with heat and motrin -denies: weakness, numbness, radiation, bowel and bladder dysfunction -long hx of low back pain, hx of back surgery, report followed by Dr. Shon Baton in ortho  ROS: See pertinent positives and negatives per HPI.  Past Medical History  Diagnosis Date  . Allergy   . Migraine   . SBE (subacute bacterial endocarditis)     Family History  Problem Relation Age of Onset  . Hyperlipidemia Mother   . Hypertension Mother   . Stroke Father   . Hyperlipidemia Brother   . Hypertension Brother     History   Social History  . Marital Status: Married    Spouse Name: N/A    Number of Children: N/A  . Years of Education: N/A   Social History Main Topics  . Smoking status: Former Smoker    Types: Cigarettes    Quit date: 07/31/1996  . Smokeless tobacco: None  . Alcohol Use: Yes  . Drug Use: None  . Sexually Active: None   Other Topics Concern  . None   Social History Narrative  . None    Current outpatient prescriptions:amLODipine (NORVASC) 10 MG tablet, Take 1 tablet (10 mg total) by mouth daily., Disp: 90 tablet, Rfl: 3;  calcium carbonate (OS-CAL) 600 MG TABS, Take 600 mg by mouth 2 (two) times daily with a meal.  , Disp: , Rfl: ;  glucosamine-chondroitin 500-400 MG tablet, Take 1 tablet by mouth 3 (three) times daily.  , Disp: , Rfl:  hydrochlorothiazide (HYDRODIURIL) 25 MG tablet, Take 1 tablet (25 mg total) by mouth daily., Disp: 90 tablet, Rfl: 1;  losartan (COZAAR) 50 MG tablet, Take 2 tablets (100 mg total) by mouth daily., Disp: 180 tablet, Rfl: 1;  omeprazole (PRILOSEC) 20 MG capsule, TAKE 1 CAPSULE (20 MG TOTAL) BY MOUTH DAILY., Disp: 90 capsule, Rfl: 0 cyclobenzaprine  (FLEXERIL) 5 MG tablet, Take 1 tablet (5 mg total) by mouth 3 (three) times daily as needed for muscle spasms., Disp: 15 tablet, Rfl: 0;  traMADol (ULTRAM) 50 MG tablet, Take 1 tablet (50 mg total) by mouth every 8 (eight) hours as needed for pain., Disp: 20 tablet, Rfl: 0  EXAM:  Filed Vitals:   12/14/11 0856  BP: 120/90  Pulse: 83    There is no height or weight on file to calculate BMI.  GENERAL: vitals reviewed and listed above, alert, oriented, appears well hydrated and in no acute distress  HEENT: atraumatic, conjunttiva clear, no obvious abnormalities on inspection of external nose and ears  NECK: no obvious masses on inspection  LUNGS: clear to auscultation bilaterally, no wheezes, rales or rhonchi, good air movement  CV: HRRR, no peripheral edema  MS: moves all extremities without noticeable abnormality Antalgic gait Normal inspection of back, no obvious scoliosis or leg length descrepancy No bony TTP Soft tissue TTP at: R>L lower lumbar paraspinal musculature, R PSIS area -/+ tests: neg trendelenburg,-facet loading, -SLRT, -CLRT, +FABER bilat, -FADIR Normal muscle strength, sensation to light touch and DTRs in LEs bilaterally  PSYCH: pleasant and cooperative, no obvious depression or anxiety, but tearful on exam  ASSESSMENT AND PLAN:  Discussed the following assessment and plan:  1. Low back pain  traMADol (  ULTRAM) 50 MG tablet, cyclobenzaprine (FLEXERIL) 5 MG tablet   -likely lumbar strain given normal muscle strength, sensation and DTRs, but given degree of pain advised she also call her ortho doctor for an appointment in case not improving -advised supportive care and she has follow up with ortho if not improving as expected -Patient advised to return or notify a doctor immediately if symptoms worsen or persist or new concerns arise.  There are no Patient Instructions on file for this visit.   Kriste Basque R.

## 2012-01-07 ENCOUNTER — Encounter: Payer: Self-pay | Admitting: Family Medicine

## 2012-01-07 ENCOUNTER — Ambulatory Visit (INDEPENDENT_AMBULATORY_CARE_PROVIDER_SITE_OTHER): Payer: BC Managed Care – PPO | Admitting: Family Medicine

## 2012-01-07 VITALS — BP 120/86 | HR 95 | Temp 98.6°F | Wt 273.0 lb

## 2012-01-07 DIAGNOSIS — K922 Gastrointestinal hemorrhage, unspecified: Secondary | ICD-10-CM

## 2012-01-07 DIAGNOSIS — K219 Gastro-esophageal reflux disease without esophagitis: Secondary | ICD-10-CM

## 2012-01-07 DIAGNOSIS — G43909 Migraine, unspecified, not intractable, without status migrainosus: Secondary | ICD-10-CM

## 2012-01-07 DIAGNOSIS — M545 Low back pain, unspecified: Secondary | ICD-10-CM

## 2012-01-07 DIAGNOSIS — K5900 Constipation, unspecified: Secondary | ICD-10-CM

## 2012-01-07 DIAGNOSIS — I10 Essential (primary) hypertension: Secondary | ICD-10-CM

## 2012-01-07 DIAGNOSIS — E669 Obesity, unspecified: Secondary | ICD-10-CM

## 2012-01-07 DIAGNOSIS — Z23 Encounter for immunization: Secondary | ICD-10-CM

## 2012-01-07 MED ORDER — OMEPRAZOLE 40 MG PO CPDR: 40.0000 mg | DELAYED_RELEASE_CAPSULE | Freq: Every day | ORAL | Status: DC

## 2012-01-07 NOTE — Patient Instructions (Signed)
-  start walking for 30 minutes 3-4 times per week  -take 40mg  daily of prilosec  -eat more fruits and veggies  -take a fiber supplement dairy such as metaeucil  -We placed a referral for you as discussed  To the GASTROENTEROLOGIST. It usually takes about 1-2 weeks to process and schedule this referral. If you have not heard from [place] regarding this appointment in 2 weeks please contact our office.

## 2012-01-07 NOTE — Progress Notes (Signed)
Chief Complaint  Patient presents with  . 2 month follow up    bp     HPI:  Here for follow up:  HTN: -stable -takes norvasc, hctz and losartan -denies: CP, SOB, HA, swelling, palpitations -labs done 11/2011: mildly low K, borderline HgbA1c -Lifestyle:  Not doing good with this - wants to start walking 30 minute 3-4 times per week, strengthening exercises, drinking more water, eat more fruits and vegetables  GERD/Constipation: -prilosec DCd last visit as symptoms extremely rare eve off med -restarted prilosec as was having some heartburn - still having heartburn a few days per week, reflux, acid in mouth -denies: nausea, dysphagia, weight loss, fevers -sometimes strains and then hard stool with soreness in rectum and blood on stool, BM not every day - goes several days without going  Migraines/?daily analgesic HAs: -weaning of daily analgesics -very rarely uses motrin -has noticed headaches have improved  Chronic Low Back Pain: -seen for flare after extensive lifting at work on 12/14/11 -doing HEP - much better! -followed by Dr. Shon Baton in ortho  ROS: See pertinent positives and negatives per HPI.  Past Medical History  Diagnosis Date  . Allergy   . Migraine   . SBE (subacute bacterial endocarditis)     Family History  Problem Relation Age of Onset  . Hyperlipidemia Mother   . Hypertension Mother   . Stroke Father   . Hyperlipidemia Brother   . Hypertension Brother     History   Social History  . Marital Status: Married    Spouse Name: N/A    Number of Children: N/A  . Years of Education: N/A   Social History Main Topics  . Smoking status: Former Smoker    Types: Cigarettes    Quit date: 07/31/1996  . Smokeless tobacco: None  . Alcohol Use: Yes  . Drug Use: None  . Sexually Active: None   Other Topics Concern  . None   Social History Narrative  . None    Current outpatient prescriptions:amLODipine (NORVASC) 10 MG tablet, Take 1 tablet (10 mg  total) by mouth daily., Disp: 90 tablet, Rfl: 3;  calcium carbonate (OS-CAL) 600 MG TABS, Take 600 mg by mouth 2 (two) times daily with a meal.  , Disp: , Rfl: ;  hydrochlorothiazide (HYDRODIURIL) 25 MG tablet, Take 1 tablet (25 mg total) by mouth daily., Disp: 90 tablet, Rfl: 1 losartan (COZAAR) 50 MG tablet, Take 2 tablets (100 mg total) by mouth daily., Disp: 180 tablet, Rfl: 1;  omeprazole (PRILOSEC) 40 MG capsule, Take 1 capsule (40 mg total) by mouth daily., Disp: 90 capsule, Rfl: 1  EXAM:  Filed Vitals:   01/07/12 0832  BP: 120/86  Pulse: 95  Temp: 98.6 F (37 C)    There is no height on file to calculate BMI.  GENERAL: vitals reviewed and listed above, alert, oriented, appears well hydrated and in no acute distress  HEENT: atraumatic, conjunttiva clear, no obvious abnormalities on inspection of external nose and ears  NECK: no obvious masses on inspection  LUNGS: clear to auscultation bilaterally, no wheezes, rales or rhonchi, good air movement  CV: HRRR, no peripheral edema  ABD: soft, NTTP, BS +  MS: moves all extremities without noticeable abnormality  PSYCH: pleasant and cooperative, no obvious depression or anxiety  ASSESSMENT AND PLAN:  Discussed the following assessment and plan:  1. Hypertension  stable  2. GERD (gastroesophageal reflux disease)  omeprazole (PRILOSEC) 40 MG capsule, Ambulatory referral to Gastroenterology, lifestyle changes  3. OBESITY  Lifestyle changes  4. GI bleeding  Likely for straining with hard stools from constipation.Ambulatory referral to Gastroenterology  5. Constipation  GI referral - needs colonoscopy, uncontrolled GERD and constipation and GI bleeding. Daily fiber supplement.  6. MIGRAINE HEADACHE  Improved  7. Low back pain  resolved   -follow up in 3 months -Patient advised to return or notify a doctor immediately if symptoms worsen or persist or new concerns arise.  Patient Instructions  -start walking for 30 minutes 3-4  times per week  -take 40mg  daily of prilosec  -eat more fruits and veggies  -take a fiber supplement dairy such as metaeucil  -We placed a referral for you as discussed  To the GASTROENTEROLOGIST. It usually takes about 1-2 weeks to process and schedule this referral. If you have not heard from [place] regarding this appointment in 2 weeks please contact our office.          Kriste Basque R.

## 2012-02-13 ENCOUNTER — Telehealth: Payer: Self-pay | Admitting: Family Medicine

## 2012-02-13 DIAGNOSIS — M545 Low back pain: Secondary | ICD-10-CM

## 2012-02-13 MED ORDER — TRAMADOL HCL 50 MG PO TABS: 50.0000 mg | ORAL_TABLET | Freq: Three times a day (TID) | ORAL | Status: DC | PRN

## 2012-02-13 MED ORDER — CYCLOBENZAPRINE HCL 5 MG PO TABS: 5.0000 mg | ORAL_TABLET | Freq: Two times a day (BID) | ORAL | Status: DC | PRN

## 2012-02-13 NOTE — Telephone Encounter (Signed)
Called and spoke with pt and pt is aware.  

## 2012-02-13 NOTE — Telephone Encounter (Signed)
Pt would like refill of the following: traMADol (ULTRAM) 50 MG cyclobenzaprine (FLEXERIL) 5 MG  Pt is having back spasms again from turning the  wrong way Pharm: [facility]/ [address]

## 2012-02-13 NOTE — Telephone Encounter (Signed)
Sent in a few of each. If pain not resolving or worsening or persists she should see Dr. Shon Baton.

## 2012-02-13 NOTE — Telephone Encounter (Signed)
Pls advise.  

## 2012-03-12 ENCOUNTER — Other Ambulatory Visit: Payer: Self-pay | Admitting: Family Medicine

## 2012-03-13 NOTE — Telephone Encounter (Signed)
I thought she was taking 40mg . But if doing better can cut back to 20mg  - Rx sent. Should schedule follow up in the next few months.

## 2012-03-14 NOTE — Telephone Encounter (Signed)
Pt returned call and states that she sometimes feels she needs to take 2 or 3 omeprazole.  She states that heartburn had stopped but returned.  Advised pt to get an appt with GI.  Pt states at last visit Dr. Selena Batten told her to call and make an appt but she was hesitant due to needing a colonoscopy. Advised pt to get an appt with GI and follow up with is as scheduled on 4.7.14

## 2012-03-14 NOTE — Telephone Encounter (Signed)
Left a message for pt to return call 

## 2012-03-25 ENCOUNTER — Encounter: Payer: Self-pay | Admitting: Gastroenterology

## 2012-04-07 ENCOUNTER — Encounter: Payer: Self-pay | Admitting: Family Medicine

## 2012-04-07 ENCOUNTER — Ambulatory Visit (INDEPENDENT_AMBULATORY_CARE_PROVIDER_SITE_OTHER): Payer: BC Managed Care – PPO | Admitting: Family Medicine

## 2012-04-07 VITALS — BP 124/82 | HR 98 | Temp 99.9°F | Wt 273.0 lb

## 2012-04-07 DIAGNOSIS — I10 Essential (primary) hypertension: Secondary | ICD-10-CM

## 2012-04-07 DIAGNOSIS — G43909 Migraine, unspecified, not intractable, without status migrainosus: Secondary | ICD-10-CM

## 2012-04-07 DIAGNOSIS — E669 Obesity, unspecified: Secondary | ICD-10-CM

## 2012-04-07 DIAGNOSIS — M545 Low back pain: Secondary | ICD-10-CM

## 2012-04-07 DIAGNOSIS — K219 Gastro-esophageal reflux disease without esophagitis: Secondary | ICD-10-CM

## 2012-04-07 MED ORDER — TRAMADOL HCL 50 MG PO TABS: 50.0000 mg | ORAL_TABLET | Freq: Three times a day (TID) | ORAL | Status: DC | PRN

## 2012-04-07 MED ORDER — CYCLOBENZAPRINE HCL 5 MG PO TABS: 5.0000 mg | ORAL_TABLET | Freq: Two times a day (BID) | ORAL | Status: DC | PRN

## 2012-04-07 NOTE — Patient Instructions (Addendum)
BACK PAIN: -heat for 15 minutes twice daily  -topical products - capsacin or menthol -ibuprofen or tylenol prn per instructions -do the home exercises  Make sure to see the gastroenterologist as scheduled  We recommend the following healthy lifestyle measures: - eat a healthy diet consisting of lots of vegetables, fruits, beans, nuts, seeds, healthy meats such as white chicken and fish and whole grains.  - avoid fried foods, fast food, processed foods, sodas, red meet and other fattening foods.  - get a least 150 minutes of aerobic exercise per week.   Follow up in 4-6 months

## 2012-04-07 NOTE — Progress Notes (Signed)
Chief Complaint  Patient presents with  . Follow-up    HPI:  Follow up:  HTN/Prediabetes: -takes norvasc, hctz, losartan -denies: CP, SOB, swelling -lifestyle: goal to start walking 3-4 times per week and improve diet last visit  GERD/Constipation/sm amount BRBPR las visit: -recs for prilosec 40mg  daily and fiber sup last visit and referred to GI - scheduled for next week -continues to have terrible acid reflux despite PPI daily - seeing GI for this  Migraines: -improved last vist  Back Pain: -thru out last week but doing better -R lower back pain -no numbness, radiation, weakness, bowel or bladder dysfunction  ROS: See pertinent positives and negatives per HPI.  Past Medical History  Diagnosis Date  . Allergy   . Migraine   . SBE (subacute bacterial endocarditis)     Family History  Problem Relation Age of Onset  . Hyperlipidemia Mother   . Hypertension Mother   . Stroke Father   . Hyperlipidemia Brother   . Hypertension Brother     History   Social History  . Marital Status: Married    Spouse Name: N/A    Number of Children: N/A  . Years of Education: N/A   Social History Main Topics  . Smoking status: Former Smoker    Types: Cigarettes    Quit date: 07/31/1996  . Smokeless tobacco: None  . Alcohol Use: Yes  . Drug Use: None  . Sexually Active: None   Other Topics Concern  . None   Social History Narrative  . None    Current outpatient prescriptions:amLODipine (NORVASC) 10 MG tablet, Take 1 tablet (10 mg total) by mouth daily., Disp: 90 tablet, Rfl: 3;  calcium carbonate (OS-CAL) 600 MG TABS, Take 600 mg by mouth 2 (two) times daily with a meal.  , Disp: , Rfl: ;  cyclobenzaprine (FLEXERIL) 5 MG tablet, Take 1 tablet (5 mg total) by mouth 2 (two) times daily as needed for muscle spasms., Disp: 10 tablet, Rfl: 0 hydrochlorothiazide (HYDRODIURIL) 25 MG tablet, Take 1 tablet (25 mg total) by mouth daily., Disp: 90 tablet, Rfl: 1;  losartan (COZAAR)  50 MG tablet, Take 2 tablets (100 mg total) by mouth daily., Disp: 180 tablet, Rfl: 1;  omeprazole (PRILOSEC) 20 MG capsule, TAKE 1 CAPSULE (20 MG TOTAL) BY MOUTH DAILY., Disp: 90 capsule, Rfl: 0 traMADol (ULTRAM) 50 MG tablet, Take 1 tablet (50 mg total) by mouth every 8 (eight) hours as needed for pain., Disp: 10 tablet, Rfl: 0  EXAM:  Filed Vitals:   04/07/12 0840  BP: 124/82  Pulse: 98  Temp: 99.9 F (37.7 C)    Body mass index is 38.09 kg/(m^2).  GENERAL: vitals reviewed and listed above, alert, oriented, appears well hydrated and in no acute distress  HEENT: atraumatic, conjunttiva clear, no obvious abnormalities on inspection of external nose and ears  NECK: no obvious masses on inspection  LUNGS: clear to auscultation bilaterally, no wheezes, rales or rhonchi, good air movement  CV: HRRR, no peripheral edema  MS: moves all extremities without noticeable abnormality -normal gait -ttp R lumbar paraspinal muscles -neg facet loading, no loss of sensation, normal muscle strength in LE bilat  PSYCH: pleasant and cooperative, no obvious depression or anxiety  ASSESSMENT AND PLAN:  Discussed the following assessment and plan:  Low back pain - Plan: cyclobenzaprine (FLEXERIL) 5 MG tablet, traMADol (ULTRAM) 50 MG tablet  OBESITY  MIGRAINE HEADACHE  Hypertension  GERD (gastroesophageal reflux disease)  -Tx for back pain per orders  and instructions - risks discussed -BP stable -advised does need to see GI for ongoing GERD despite PPI - likely need EGD -follow up in 4-6 months -Patient advised to return or notify a doctor immediately if symptoms worsen or persist or new concerns arise.  Patient Instructions  BACK PAIN: -heat for 15 minutes twice daily  -topical products - capsacin or menthol -ibuprofen or tylenol prn per instructions -do the home exercises  Make sure to see the gastroenterologist as scheduled  We recommend the following healthy lifestyle  measures: - eat a healthy diet consisting of lots of vegetables, fruits, beans, nuts, seeds, healthy meats such as white chicken and fish and whole grains.  - avoid fried foods, fast food, processed foods, sodas, red meet and other fattening foods.  - get a least 150 minutes of aerobic exercise per week.   Follow up in 4-6 months     KIM, HANNAH R.

## 2012-04-16 ENCOUNTER — Encounter: Payer: Self-pay | Admitting: Gastroenterology

## 2012-04-16 ENCOUNTER — Ambulatory Visit (INDEPENDENT_AMBULATORY_CARE_PROVIDER_SITE_OTHER): Payer: BC Managed Care – PPO | Admitting: Gastroenterology

## 2012-04-16 VITALS — BP 124/80 | HR 94 | Ht 71.0 in | Wt 281.0 lb

## 2012-04-16 DIAGNOSIS — K5900 Constipation, unspecified: Secondary | ICD-10-CM

## 2012-04-16 DIAGNOSIS — R079 Chest pain, unspecified: Secondary | ICD-10-CM

## 2012-04-16 DIAGNOSIS — K921 Melena: Secondary | ICD-10-CM

## 2012-04-16 MED ORDER — PEG-KCL-NACL-NASULF-NA ASC-C 100 G PO SOLR: 1.0000 | Freq: Once | ORAL | Status: DC

## 2012-04-16 NOTE — Progress Notes (Addendum)
History of Present Illness: This is a 52 year old female who relates recurrent episodes of chest pain, upper back pain and shoulder pain that tend to last for one to 2 days at that time. They are not brought on by her impacted by meals exertion or certain time of day. She has had no response to [employer] and Prilosec. She also notes mild constipation since beginning medications for hypertension. Occasionally when she has a hard bowel movement she'll notice a small amount of rectal bleeding.  Review of Systems: Pertinent positive and negative review of systems were noted in the above HPI section. All other review of systems were otherwise negative.  Current Medications, Allergies, Past Medical History, Past Surgical History, Family History and Social History were reviewed in [employer] record.  Physical Exam: General: Well developed , well nourished, no acute distress Head: Normocephalic and atraumatic Eyes:  sclerae anicteric, EOMI Ears: Normal auditory acuity Mouth: No deformity or lesions Neck: Supple, no masses or thyromegaly Lungs: Clear throughout to auscultation, no chest wall tenderness, no back tenderness Heart: Regular rate and rhythm; no murmurs, rubs or bruits Abdomen: Soft, non tender and non distended. No masses, hepatosplenomegaly or hernias noted. Normal Bowel sounds Rectal: Deferred to colonoscopy at patient's request Musculoskeletal: Symmetrical with no gross deformities  Skin: No lesions on visible extremities Pulses:  Normal pulses noted Extremities: No clubbing, cyanosis, edema or deformities noted Neurological: Alert oriented x 4, grossly nonfocal Cervical Nodes:  No significant cervical adenopathy Inguinal Nodes: No significant inguinal adenopathy Psychological:  Alert and cooperative. Normal mood and affect  Assessment and Recommendations:  1. Intermittent chest, shoulder and back pain. Her symptoms do not appear to be related to a GI disorder.  Schedule abdominal ultrasound to rule out cholelithiasis. Schedule upper endoscopy to rule out GERD, ulcer disease and other disorders. The risks, benefits, and alternatives to endoscopy with possible biopsy and possible dilation were discussed with the patient and they consent to proceed.  2. Small-volume hematochezia and constipation. Increase dietary fiber and water intake. Schedule colonoscopy to rule out colorectal neoplasms, hemorrhoids, proctitis and other disorders. The risks, benefits, and alternatives to colonoscopy with possible biopsy and possible polypectomy were discussed with the patient and they consent to proceed.

## 2012-04-16 NOTE — Patient Instructions (Addendum)
You have been scheduled for an abdominal ultrasound at [facility] (1st floor of hospital) on 04/22/12 at 8:00am. Please arrive 15 minutes prior to your appointment for registration. Make certain not to have anything to eat or drink 6 hours prior to your appointment. Should you need to reschedule your appointment, please contact radiology at [phone]. This test typically takes about 30 minutes to perform.  You have been scheduled for an endoscopy and colonoscopy with propofol. Please follow the written instructions given to you at your visit today. Please pick up your prep at the pharmacy within the next 1-3 days. If you use inhalers (even only as needed), please bring them with you on the day of your procedure.  You have been given a High fiber diet and constipation instructions.   Thank you for choosing me and [facility].  Venita Lick. Pleas Koch., MD., [facility] Graham

## 2012-04-22 ENCOUNTER — Ambulatory Visit (HOSPITAL_COMMUNITY): Payer: BC Managed Care – PPO

## 2012-04-22 ENCOUNTER — Ambulatory Visit (HOSPITAL_COMMUNITY)
Admission: RE | Admit: 2012-04-22 | Discharge: 2012-04-22 | Disposition: A | Discharge status: Home / Self Care | Payer: BC Managed Care – PPO | Source: Ambulatory Visit | Attending: Gastroenterology | Admitting: Gastroenterology

## 2012-04-22 DIAGNOSIS — K5900 Constipation, unspecified: Secondary | ICD-10-CM

## 2012-04-22 DIAGNOSIS — M549 Dorsalgia, unspecified: Secondary | ICD-10-CM | POA: Insufficient documentation

## 2012-04-22 DIAGNOSIS — R079 Chest pain, unspecified: Secondary | ICD-10-CM

## 2012-04-22 DIAGNOSIS — K921 Melena: Secondary | ICD-10-CM

## 2012-05-27 ENCOUNTER — Encounter: Payer: BC Managed Care – PPO | Admitting: Gastroenterology

## 2012-06-12 ENCOUNTER — Other Ambulatory Visit: Payer: Self-pay | Admitting: Family Medicine

## 2012-06-23 ENCOUNTER — Other Ambulatory Visit: Payer: Self-pay | Admitting: Family Medicine

## 2012-07-14 ENCOUNTER — Telehealth: Payer: Self-pay | Admitting: *Deleted

## 2012-07-14 ENCOUNTER — Telehealth: Payer: Self-pay | Admitting: Gastroenterology

## 2012-07-14 NOTE — Telephone Encounter (Signed)
I spoke with the patient, and I told her to buy two bottles of mag citrate.  She is aware that she needs to take both before her prep tonight.   Then she needs to take her prep as directed tomorrow.  She was told again not to eat anything solid today nor tomorrow.  We reviewed her pre-visit instructions again.   Patient did agree to buy the mag citrate and do the prep as Dr. Russella Dar instructed.

## 2012-07-14 NOTE — Telephone Encounter (Signed)
Patient states that she has eaten pancakes and pizza today.  States that she forgot that she was supposed to be on only clear liquids.   Patient states that she can still come if she takes her prep.   I explained to her that the clear liquids were for a reason, and that she probably would not be cleaned out.   I told her that if she weren't cleaned out that we might miss something.   She also asked about the cancellation fee for late cancellation.  She said that she didn't have a job.  She was told not to eat anything else until I called her back.

## 2012-07-14 NOTE — Telephone Encounter (Signed)
She can take 2 bottles of Mag Citrate before the prep and push clear liquids.  That should be adequate to get a good prep.

## 2012-07-15 ENCOUNTER — Ambulatory Visit (AMBULATORY_SURGERY_CENTER): Payer: BC Managed Care – PPO | Admitting: Gastroenterology

## 2012-07-15 ENCOUNTER — Encounter: Payer: Self-pay | Admitting: Gastroenterology

## 2012-07-15 VITALS — BP 126/87 | HR 66 | Temp 98.1°F | Resp 13 | Ht 71.0 in | Wt 281.0 lb

## 2012-07-15 DIAGNOSIS — R079 Chest pain, unspecified: Secondary | ICD-10-CM

## 2012-07-15 DIAGNOSIS — D126 Benign neoplasm of colon, unspecified: Secondary | ICD-10-CM

## 2012-07-15 DIAGNOSIS — K921 Melena: Secondary | ICD-10-CM

## 2012-07-15 MED ORDER — SODIUM CHLORIDE 0.9 % IV SOLN: 500.0000 mL | INTRAVENOUS | Status: DC

## 2012-07-15 NOTE — Progress Notes (Signed)
Patient did not experience any of the following events: a burn prior to discharge; a fall within the facility; wrong site/side/patient/procedure/implant event; or a hospital transfer or hospital admission upon discharge from the facility. (G8907) Patient did not have preoperative order for IV antibiotic SSI prophylaxis. (G8918)  

## 2012-07-15 NOTE — Op Note (Signed)
[facility] [address] [place], [address]   ENDOSCOPY PROCEDURE REPORT  PATIENT: Jamie [name], Jamie [name]  MR#: [id number] BIRTHDATE: Mar 26, 1960 , 52  yrs. old GENDER: Female ENDOSCOPIST: Meryl Dare, MD, [facility] REFERRED BY:  Kriste Basque, D.O. PROCEDURE DATE:  07/15/2012 PROCEDURE:  EGD, diagnostic ASA CLASS:     Class II INDICATIONS:  Chest pain. MEDICATIONS: There was residual sedation effect present from prior procedure, MAC sedation, administered by CRNA, and propofol (Diprivan) 200mg  IV TOPICAL ANESTHETIC: none DESCRIPTION OF PROCEDURE: After the risks benefits and alternatives of the procedure were thoroughly explained, informed consent was obtained.  The LB [id number] endoscope was introduced through the mouth and advanced to the second portion of the duodenum without limitations.  The instrument was slowly withdrawn as the mucosa was fully examined.  ESOPHAGUS: The mucosa of the esophagus appeared normal. STOMACH: The mucosa of the stomach appeared normal. DUODENUM: The duodenal mucosa showed no abnormalities in the bulb and second portion of the duodenum. Retroflexed views revealed no abnormalities.  The scope was then withdrawn from the patient and the procedure completed.  COMPLICATIONS: There were no complications.  ENDOSCOPIC IMPRESSION: 1.   The EGD appeared normal  RECOMMENDATIONS: 1. Call to schedule a follow-up appointment with primary MD as planned   eSigned:  Meryl Dare, MD, [facility] 07/15/2012 9:37 AM

## 2012-07-15 NOTE — Op Note (Signed)
[facility] [address] [place], [address]   COLONOSCOPY PROCEDURE REPORT  PATIENT: Jamie [name], Jamie [name]  MR#: [id number] BIRTHDATE: Apr 04, 1960 , 52  yrs. old GENDER: Female ENDOSCOPIST: Meryl Dare, MD, [facility] REFERRED HQ:[facility] Selena Batten, D.O. PROCEDURE DATE:  07/15/2012 PROCEDURE:   Colonoscopy with snare polypectomy ASA CLASS:   Class II INDICATIONS:hematochezia. MEDICATIONS: MAC sedation, administered by CRNA and propofol (Diprivan) 350mg  IV DESCRIPTION OF PROCEDURE:   After the risks benefits and alternatives of the procedure were thoroughly explained, informed consent was obtained.  A digital rectal exam revealed no abnormalities of the rectum.   The LB [id number]  endoscope was introduced through the anus and advanced to the cecum, which was identified by both the appendix and ileocecal valve. No adverse events experienced.   The quality of the prep was excellent, using MoviPrep  The instrument was then slowly withdrawn as the colon was fully examined.  COLON FINDINGS: Two sessile polyps ranging between 5-7 mm in size were found in the transverse colon.  A polypectomy was performed with a cold snare.  The resection was complete and the polyp tissue was completely retrieved.   The colon was otherwise normal.  There was no diverticulosis, inflammation, polyps or cancers unless previously stated.  Retroflexed views revealed small internal hemorrhoids. The time to cecum=3 minutes 25 seconds.  Withdrawal time=10 minutes 45 seconds.  The scope was withdrawn and the procedure completed.  COMPLICATIONS: There were no complications.  ENDOSCOPIC IMPRESSION: 1.   Two sessile polyps 5-7 mm in the transverse colon; polypectomy performed with a cold snare 2.   Small internal hemorrhoids  RECOMMENDATIONS: 1.  Await pathology results 2.  Repeat colonoscopy in 5 years if polyp adenomatous; otherwise 10 years   eSigned:  Meryl Dare, MD, [facility] 07/15/2012  9:28 AM

## 2012-07-15 NOTE — Patient Instructions (Addendum)
YOU HAD AN ENDOSCOPIC PROCEDURE TODAY AT THE [facility]: Refer to the procedure report that was given to you for any specific questions about what was found during the examination.  If the procedure report does not answer your questions, please call your gastroenterologist to clarify.  If you requested that your care partner not be given the details of your procedure findings, then the procedure report has been included in a sealed envelope for you to review at your convenience later.  YOU SHOULD EXPECT: Some feelings of bloating in the abdomen. Passage of more gas than usual.  Walking can help get rid of the air that was put into your GI tract during the procedure and reduce the bloating. If you had a lower endoscopy (such as a colonoscopy or flexible sigmoidoscopy) you may notice spotting of blood in your stool or on the toilet paper. If you underwent a bowel prep for your procedure, then you may not have a normal bowel movement for a few days.  DIET: Your first meal following the procedure should be a light meal and then it is ok to progress to your normal diet.  A half-sandwich or bowl of soup is an example of a good first meal.  Heavy or fried foods are harder to digest and may make you feel nauseous or bloated.  Likewise meals heavy in dairy and vegetables can cause extra gas to form and this can also increase the bloating.  Drink plenty of fluids but you should avoid alcoholic beverages for 24 hours.  ACTIVITY: Your care partner should take you home directly after the procedure.  You should plan to take it easy, moving slowly for the rest of the day.  You can resume normal activity the day after the procedure however you should NOT DRIVE or use heavy machinery for 24 hours (because of the sedation medicines used during the test).    SYMPTOMS TO REPORT IMMEDIATELY: A gastroenterologist can be reached at any hour.  During normal business hours, 8:30 AM to 5:00 PM Monday through Friday,  call [phone].  After hours and on weekends, please call the GI answering service at [phone] who will take a message and have the physician on call contact you.   Following lower endoscopy (colonoscopy or flexible sigmoidoscopy):  Excessive amounts of blood in the stool  Significant tenderness or worsening of abdominal pains  Swelling of the abdomen that is new, acute  Fever of 100F or higher  Following upper endoscopy (EGD)  Vomiting of blood or coffee ground material  New chest pain or pain under the shoulder blades  Painful or persistently difficult swallowing  New shortness of breath  Fever of 100F or higher  Black, tarry-looking stools  FOLLOW UP: If any biopsies were taken you will be contacted by phone or by letter within the next 1-3 weeks.  Call your gastroenterologist if you have not heard about the biopsies in 3 weeks.  Our staff will call the home number listed on your records the next business day following your procedure to check on you and address any questions or concerns that you may have at that time regarding the information given to you following your procedure. This is a courtesy call and so if there is no answer at the home number and we have not heard from you through the emergency physician on call, we will assume that you have returned to your regular daily activities without incident.  SIGNATURES/CONFIDENTIALITY: You and/or your care   partner have signed paperwork which will be entered into your electronic medical record.  These signatures attest to the fact that that the information above on your After Visit Summary has been reviewed and is understood.  Full responsibility of the confidentiality of this discharge information lies with you and/or your care-partner.  Resume medications. Information given on polyps, hemorrhoids and high fiber diet with discharge instructions. 

## 2012-07-15 NOTE — Progress Notes (Signed)
Called to room to assist during endoscopic procedure.  Patient ID and intended procedure confirmed with present staff. Received instructions for my participation in the procedure from the performing physician.  

## 2012-07-15 NOTE — Progress Notes (Signed)
Lidocaine-40mg IV prior to Propofol InductionPropofol given over incremental dosages 

## 2012-07-16 ENCOUNTER — Telehealth: Payer: Self-pay | Admitting: *Deleted

## 2012-07-16 NOTE — Telephone Encounter (Signed)
  Follow up Call-  Call back number 07/15/2012  Post procedure Call Back phone  # [phone]  Permission to leave phone message Yes     Patient questions:  Do you have a fever, pain , or abdominal swelling? no Pain Score  0 *  Have you tolerated food without any problems? yes  Have you been able to return to your normal activities? yes  Do you have any questions about your discharge instructions: Diet   no Medications  no Follow up visit  no  Do you have questions or concerns about your Care? no  Actions: * If pain score is 4 or above: No action needed, pain <4.

## 2012-07-21 ENCOUNTER — Encounter: Payer: Self-pay | Admitting: Gastroenterology

## 2012-07-29 NOTE — Telephone Encounter (Signed)
See other note 07/14/12.

## 2012-08-07 ENCOUNTER — Other Ambulatory Visit: Payer: Self-pay

## 2012-08-07 DIAGNOSIS — Z1231 Encounter for screening mammogram for malignant neoplasm of breast: Secondary | ICD-10-CM

## 2012-08-21 ENCOUNTER — Other Ambulatory Visit: Payer: Self-pay | Admitting: Family Medicine

## 2012-08-22 ENCOUNTER — Ambulatory Visit: Payer: BC Managed Care – PPO

## 2012-09-02 ENCOUNTER — Ambulatory Visit
Admission: RE | Admit: 2012-09-02 | Discharge: 2012-09-02 | Disposition: A | Discharge status: Home / Self Care | Payer: BC Managed Care – PPO | Source: Ambulatory Visit

## 2012-09-02 DIAGNOSIS — Z1231 Encounter for screening mammogram for malignant neoplasm of breast: Secondary | ICD-10-CM

## 2012-09-08 ENCOUNTER — Ambulatory Visit (INDEPENDENT_AMBULATORY_CARE_PROVIDER_SITE_OTHER): Payer: BC Managed Care – PPO | Admitting: Family Medicine

## 2012-09-08 ENCOUNTER — Encounter: Payer: Self-pay | Admitting: Family Medicine

## 2012-09-08 VITALS — BP 110/82 | Temp 98.7°F | Wt 282.0 lb

## 2012-09-08 DIAGNOSIS — E669 Obesity, unspecified: Secondary | ICD-10-CM

## 2012-09-08 DIAGNOSIS — I10 Essential (primary) hypertension: Secondary | ICD-10-CM

## 2012-09-08 DIAGNOSIS — Z23 Encounter for immunization: Secondary | ICD-10-CM

## 2012-09-08 DIAGNOSIS — K219 Gastro-esophageal reflux disease without esophagitis: Secondary | ICD-10-CM

## 2012-09-08 DIAGNOSIS — R7309 Other abnormal glucose: Secondary | ICD-10-CM

## 2012-09-08 DIAGNOSIS — R7303 Prediabetes: Secondary | ICD-10-CM

## 2012-09-08 LAB — BASIC METABOLIC PANEL
BUN: 12 mg/dL (ref 6–23)
CO2: 27 mEq/L (ref 19–32)
Calcium: 9.1 mg/dL (ref 8.4–10.5)
Creatinine, Ser: 0.9 mg/dL (ref 0.4–1.2)
GFR: 82.87 mL/min (ref >60.00)
Glucose, Bld: 98 mg/dL (ref 70–99)

## 2012-09-08 LAB — LIPID PANEL
Cholesterol: 177 mg/dL (ref 0–200)
HDL: 44.2 mg/dL (ref >39.00)
Triglycerides: 136 mg/dL (ref 0.0–149.0)
VLDL: 27.2 mg/dL (ref 0.0–40.0)

## 2012-09-08 LAB — HEMOGLOBIN A1C: Hgb A1c MFr Bld: 6.2 % (ref 4.6–6.5)

## 2012-09-08 MED ORDER — LOSARTAN POTASSIUM 50 MG PO TABS: 50.0000 mg | ORAL_TABLET | Freq: Every day | ORAL | Status: DC

## 2012-09-08 NOTE — Addendum Note (Signed)
Addended by: Azucena Freed on: 09/08/2012 08:50 AM   Modules accepted: Orders

## 2012-09-08 NOTE — Progress Notes (Signed)
Chief Complaint  Patient presents with  . 4-6 month follow up    HPI:  Follow up:  1-2)HTN/Prediabetes: -takes HCTZ 25,norvasc 10 , losartan 100mg  -stable - but thinks on too much medicine - occ dizzy when stands up too fast -denies: CP, SOB, palpitations -lifestyle: no regular exercise; diet is poor -last labs 11/2011  GERD: -takes omeprazole 20mg  - and zantac and still has heartburn -saw GI  HM: -flu vaccine offered today  ROS: See pertinent positives and negatives per HPI.  Past Medical History  Diagnosis Date  . Allergy   . Migraine   . Mitral valve prolapse   . Arthritis     in back  . GERD (gastroesophageal reflux disease)   . Hyperlipidemia   . Hypertension     Past Surgical History  Procedure Laterality Date  . Tubal ligation    . Cesarean section    . Back surgery      Family History  Problem Relation Age of Onset  . Hyperlipidemia Mother   . Hypertension Mother   . Stroke Father   . Hyperlipidemia Brother     x2  . Hypertension Brother     x2  . Liver cancer Maternal Aunt     History   Social History  . Marital Status: Married    Spouse Name: N/A    Number of Children: 1  . Years of Education: N/A   Occupational History  .     Social History Main Topics  . Smoking status: Former Smoker    Types: Cigarettes    Quit date: 07/31/1996  . Smokeless tobacco: Never Used  . Alcohol Use: Yes     Comment: very little  . Drug Use: No  . Sexual Activity: None   Other Topics Concern  . None   Social History Narrative  . None    Current outpatient prescriptions:amLODipine (NORVASC) 10 MG tablet, Take 1 tablet (10 mg total) by mouth daily., Disp: 90 tablet, Rfl: 3;  calcium carbonate (OS-CAL) 600 MG TABS, Take 600 mg by mouth 2 (two) times daily with a meal.  , Disp: , Rfl: ;  cyclobenzaprine (FLEXERIL) 5 MG tablet, Take 1 tablet (5 mg total) by mouth 2 (two) times daily as needed for muscle spasms., Disp: 10 tablet, Rfl:  0 hydrochlorothiazide (HYDRODIURIL) 25 MG tablet, TAKE 1 TABLET (25 MG TOTAL) BY MOUTH DAILY., Disp: 90 tablet, Rfl: 1;  losartan (COZAAR) 50 MG tablet, Take 1 tablet (50 mg total) by mouth daily., Disp: 90 tablet, Rfl: 3;  omeprazole (PRILOSEC) 20 MG capsule, TAKE 1 CAPSULE (20 MG TOTAL) BY MOUTH DAILY., Disp: 90 capsule, Rfl: 3;  ranitidine (ZANTAC) 150 MG tablet, Take 150 mg by mouth 2 (two) times daily., Disp: , Rfl:  traMADol (ULTRAM) 50 MG tablet, Take 1 tablet (50 mg total) by mouth every 8 (eight) hours as needed for pain., Disp: 10 tablet, Rfl: 0;  valACYclovir (VALTREX) 500 MG tablet, , Disp: , Rfl:   EXAM:  Filed Vitals:   09/08/12 0804  BP: 110/82  Temp: 98.7 F (37.1 C)    Body mass index is 39.35 kg/(m^2).  GENERAL: vitals reviewed and listed above, alert, oriented, appears well hydrated and in no acute distress  HEENT: atraumatic, conjunttiva clear, no obvious abnormalities on inspection of external nose and ears  NECK: no obvious masses on inspection  LUNGS: clear to auscultation bilaterally, no wheezes, rales or rhonchi, good air movement  CV: HRRR, no peripheral edema  MS: moves  all extremities without noticeable abnormality  PSYCH: pleasant and cooperative, no obvious depression or anxiety  ASSESSMENT AND PLAN:  Discussed the following assessment and plan:  OBESITY - Plan: Hemoglobin A1c, Lipid Panel  Hypertension - Plan: Basic metabolic panel  GERD (gastroesophageal reflux disease)  Prediabetes  -flu vaccine -FASTING LABS -advised regular exercise and healthy diet -decreased losartan to 50mg  daily -increased omeprazole to 40mg  dailay and reflux diet - she is to follow up with GI if GERD persists -follow up with me in 3 months -rx for BP cuff given -Patient advised to return or notify a doctor immediately if symptoms worsen or persist or new concerns arise.  Patient Instructions  -increase omeprazole to 40mg  daily and follow reflux diet and start  exercise and diet  -We recommend the following healthy lifestyle measures: - eat a healthy diet consisting of lots of vegetables, fruits, beans, nuts, seeds, healthy meats such as white chicken and fish and whole grains.  - avoid fried foods, fast food, processed foods, sodas, red meet and other fattening foods.  - get a least 150 minutes of aerobic exercise per week.   -We have ordered labs or studies at this visit. It can take up to 1-2 weeks for results and processing. We will contact you with instructions IF your results are abnormal. Normal results will be released to your [facility]. If you have not heard from [place] or can not find your results in [facility] in 2 weeks please contact our office.  -decrease losartan to once daily  -check blood pressure a f few times per week after sitting for five minutes, feet flat on floor, no talking and relaxed when checking and keep log - if running over 150/90s let us know  -Follow up in December   Diet for Gastroesophageal Reflux Disease, Adult Reflux (acid reflux) is when acid from your stomach flows up into the esophagus. When acid comes in contact with the esophagus, the acid causes irritation and soreness (inflammation) in the esophagus. When reflux happens often or so severely that it causes damage to the esophagus, it is called gastroesophageal reflux disease (GERD). Nutrition therapy can help ease the discomfort of GERD. FOODS OR DRINKS TO AVOID OR LIMIT  Smoking or chewing tobacco. Nicotine is one of the most potent stimulants to acid production in the gastrointestinal tract.  Caffeinated and decaffeinated coffee and black tea.  Regular or low-calorie carbonated beverages or energy drinks (caffeine-free carbonated beverages are allowed).   Strong spices, such as black pepper, white pepper, red pepper, cayenne, curry powder, and chili powder.  Peppermint or spearmint.  Chocolate.  High-fat foods, including meats and fried foods. Extra added  fats including oils, butter, salad dressings, and nuts. Limit these to less than 8 tsp per day.  Fruits and vegetables if they are not tolerated, such as citrus fruits or tomatoes.  Alcohol.  Any food that seems to aggravate your condition. If you have questions regarding your diet, call your caregiver or a registered dietitian. OTHER THINGS THAT MAY HELP GERD INCLUDE:   Eating your meals slowly, in a relaxed setting.  Eating 5 to 6 small meals per day instead of 3 large meals.  Eliminating food for a period of time if it causes distress.  Not lying down until 3 hours after eating a meal.  Keeping the head of your bed raised 6 to 9 inches (15 to 23 cm) by using a foam wedge or blocks under the legs of the bed. Lying flat may  make symptoms worse.  Being physically active. Weight loss may be helpful in reducing reflux in overweight or obese adults.  Wear loose fitting clothing EXAMPLE MEAL PLAN This meal plan is approximately 2,000 calories based on https://www.[facility].org/ meal planning guidelines. Breakfast   cup cooked oatmeal.  1 cup strawberries.  1 cup low-fat milk.  1 oz almonds. Snack  1 cup cucumber slices.  6 oz yogurt (made from low-fat or fat-free milk). Lunch  2 slice whole-wheat bread.  2 oz sliced [place].  2 tsp mayonnaise.  1 cup blueberries.  1 cup snap peas. Snack  6 whole-wheat crackers.  1 oz string cheese. Dinner   cup brown rice.  1 cup mixed veggies.  1 tsp olive oil.  3 oz grilled fish. Document Released: 12/18/2004 Document Revised: 03/12/2011 Document Reviewed: 11/03/2010 [facility] Patient Information 2014 [place], Lona Kettle, Dahlia Client R.

## 2012-09-08 NOTE — Patient Instructions (Addendum)
-increase omeprazole to 40mg  daily and follow reflux diet and start exercise and diet  -We recommend the following healthy lifestyle measures: - eat a healthy diet consisting of lots of vegetables, fruits, beans, nuts, seeds, healthy meats such as white chicken and fish and whole grains.  - avoid fried foods, fast food, processed foods, sodas, red meet and other fattening foods.  - get a least 150 minutes of aerobic exercise per week.   -We have ordered labs or studies at this visit. It can take up to 1-2 weeks for results and processing. We will contact you with instructions IF your results are abnormal. Normal results will be released to your [facility]. If you have not heard from [place] or can not find your results in [facility] in 2 weeks please contact our office.  -decrease losartan to once daily  -check blood pressure a f few times per week after sitting for five minutes, feet flat on floor, no talking and relaxed when checking and keep log - if running over 150/90s let us know  -Follow up in December   Diet for Gastroesophageal Reflux Disease, Adult Reflux (acid reflux) is when acid from your stomach flows up into the esophagus. When acid comes in contact with the esophagus, the acid causes irritation and soreness (inflammation) in the esophagus. When reflux happens often or so severely that it causes damage to the esophagus, it is called gastroesophageal reflux disease (GERD). Nutrition therapy can help ease the discomfort of GERD. FOODS OR DRINKS TO AVOID OR LIMIT  Smoking or chewing tobacco. Nicotine is one of the most potent stimulants to acid production in the gastrointestinal tract.  Caffeinated and decaffeinated coffee and black tea.  Regular or low-calorie carbonated beverages or energy drinks (caffeine-free carbonated beverages are allowed).   Strong spices, such as black pepper, white pepper, red pepper, cayenne, curry powder, and chili powder.  Peppermint or  spearmint.  Chocolate.  High-fat foods, including meats and fried foods. Extra added fats including oils, butter, salad dressings, and nuts. Limit these to less than 8 tsp per day.  Fruits and vegetables if they are not tolerated, such as citrus fruits or tomatoes.  Alcohol.  Any food that seems to aggravate your condition. If you have questions regarding your diet, call your caregiver or a registered dietitian. OTHER THINGS THAT MAY HELP GERD INCLUDE:   Eating your meals slowly, in a relaxed setting.  Eating 5 to 6 small meals per day instead of 3 large meals.  Eliminating food for a period of time if it causes distress.  Not lying down until 3 hours after eating a meal.  Keeping the head of your bed raised 6 to 9 inches (15 to 23 cm) by using a foam wedge or blocks under the legs of the bed. Lying flat may make symptoms worse.  Being physically active. Weight loss may be helpful in reducing reflux in overweight or obese adults.  Wear loose fitting clothing EXAMPLE MEAL PLAN This meal plan is approximately 2,000 calories based on https://www.[facility]nard.org/ meal planning guidelines. Breakfast   cup cooked oatmeal.  1 cup strawberries.  1 cup low-fat milk.  1 oz almonds. Snack  1 cup cucumber slices.  6 oz yogurt (made from low-fat or fat-free milk). Lunch  2 slice whole-wheat bread.  2 oz sliced [place].  2 tsp mayonnaise.  1 cup blueberries.  1 cup snap peas. Snack  6 whole-wheat crackers.  1 oz string cheese. Dinner   cup brown rice.  1  cup mixed veggies.  1 tsp olive oil.  3 oz grilled fish. Document Released: 12/18/2004 Document Revised: 03/12/2011 Document Reviewed: 11/03/2010 [facility] Patient Information 2014 [place], [place].

## 2012-09-10 ENCOUNTER — Telehealth: Payer: Self-pay | Admitting: Family Medicine

## 2012-09-10 NOTE — Progress Notes (Signed)
Quick Note:  Left a message for pt to return call. Released to mychart as well. ______ 

## 2012-09-10 NOTE — Telephone Encounter (Signed)
Pt returned your call. pls call on that cell phone

## 2012-09-11 NOTE — Progress Notes (Signed)
Quick Note:  Called and spoke with pt and pt is aware. ______ 

## 2012-09-11 NOTE — Telephone Encounter (Signed)
Called and spoke with pt and pt is aware.  

## 2012-09-17 ENCOUNTER — Other Ambulatory Visit: Payer: Self-pay

## 2012-09-17 MED ORDER — OMEPRAZOLE 40 MG PO CPDR: 40.0000 mg | DELAYED_RELEASE_CAPSULE | Freq: Every day | ORAL | Status: DC

## 2012-09-17 NOTE — Telephone Encounter (Signed)
Dose of omeprazole increased to 40 mg.  New rx sent to pharmacy.

## 2012-11-20 ENCOUNTER — Other Ambulatory Visit: Payer: Self-pay | Admitting: Family Medicine

## 2012-12-05 ENCOUNTER — Encounter: Payer: Self-pay | Admitting: *Deleted

## 2012-12-08 ENCOUNTER — Ambulatory Visit (INDEPENDENT_AMBULATORY_CARE_PROVIDER_SITE_OTHER): Payer: BC Managed Care – PPO | Admitting: Family Medicine

## 2012-12-08 VITALS — BP 120/86 | Temp 98.8°F | Wt 288.0 lb

## 2012-12-08 DIAGNOSIS — R7303 Prediabetes: Secondary | ICD-10-CM

## 2012-12-08 DIAGNOSIS — K5900 Constipation, unspecified: Secondary | ICD-10-CM | POA: Insufficient documentation

## 2012-12-08 DIAGNOSIS — R7309 Other abnormal glucose: Secondary | ICD-10-CM

## 2012-12-08 DIAGNOSIS — I10 Essential (primary) hypertension: Secondary | ICD-10-CM

## 2012-12-08 DIAGNOSIS — K219 Gastro-esophageal reflux disease without esophagitis: Secondary | ICD-10-CM

## 2012-12-08 NOTE — Progress Notes (Signed)
Pre visit review using our clinic review tool, if applicable. No additional management support is needed unless otherwise documented below in the visit note. 

## 2012-12-08 NOTE — Patient Instructions (Signed)
For Constipation: -fiber supplement every morning -if not at goal take 1 capuful in liquid daily for 3 days in a row, if not successful then increase to twice a day until soft bowel movements   Follow up in 3-4 months

## 2012-12-08 NOTE — Progress Notes (Signed)
Chief Complaint  Patient presents with  . Follow-up    HPI:  Follow up:  HTN: -decreased losartan last visit due to dizziness -reports: reports feels better on decreased dose -denies:  GERD: -increased PPI and advised lifestyle recs last visit -still having some symptoms  Prediabetes/HLD -lifestyle recs advised -lifestyle: is going to start exercising, diet is ok  Chronic constipation: -straining, hard stools, pellets -chronic -denies: fevers, weight loss, vomiting, blood in stools -had colonoscopy recently was fine  ROS: See pertinent positives and negatives per HPI.  Past Medical History  Diagnosis Date  . Allergy   . Migraine   . Mitral valve prolapse   . Arthritis     in back  . GERD (gastroesophageal reflux disease)   . Hyperlipidemia   . Hypertension     Past Surgical History  Procedure Laterality Date  . Tubal ligation    . Cesarean section    . Back surgery      Family History  Problem Relation Age of Onset  . Hyperlipidemia Mother   . Hypertension Mother   . Stroke Father   . Hyperlipidemia Brother     x2  . Hypertension Brother     x2  . Liver cancer Maternal Aunt     History   Social History  . Marital Status: Married    Spouse Name: N/A    Number of Children: 1  . Years of Education: N/A   Occupational History  .     Social History Main Topics  . Smoking status: Former Smoker    Types: Cigarettes    Quit date: 07/31/1996  . Smokeless tobacco: Never Used  . Alcohol Use: Yes     Comment: very little  . Drug Use: No  . Sexual Activity: Not on file   Other Topics Concern  . Not on file   Social History Narrative  . No narrative on file    Current outpatient prescriptions:amLODipine (NORVASC) 10 MG tablet, TAKE 1 TABLET DAILY., Disp: 90 tablet, Rfl: 3;  calcium carbonate (OS-CAL) 600 MG TABS, Take 600 mg by mouth 2 (two) times daily with a meal.  , Disp: , Rfl: ;  hydrochlorothiazide (HYDRODIURIL) 25 MG tablet, TAKE 1  TABLET (25 MG TOTAL) BY MOUTH DAILY., Disp: 90 tablet, Rfl: 1;  losartan (COZAAR) 50 MG tablet, Take 1 tablet (50 mg total) by mouth daily., Disp: 90 tablet, Rfl: 3 omeprazole (PRILOSEC) 40 MG capsule, Take 1 capsule (40 mg total) by mouth daily., Disp: 90 capsule, Rfl: 2;  ranitidine (ZANTAC) 150 MG tablet, Take 150 mg by mouth 2 (two) times daily., Disp: , Rfl: ;  valACYclovir (VALTREX) 500 MG tablet, , Disp: , Rfl:   EXAM:  Filed Vitals:   12/08/12 0808  BP: 120/86  Temp: 98.8 F (37.1 C)    Body mass index is 40.19 kg/(m^2).  GENERAL: vitals reviewed and listed above, alert, oriented, appears well hydrated and in no acute distress  HEENT: atraumatic, conjunttiva clear, no obvious abnormalities on inspection of external nose and ears  NECK: no obvious masses on inspection  LUNGS: clear to auscultation bilaterally, no wheezes, rales or rhonchi, good air movement  CV: HRRR, no peripheral edema  MS: moves all extremities without noticeable abnormality  PSYCH: pleasant and cooperative, no obvious depression or anxiety  ASSESSMENT AND PLAN:  Discussed the following assessment and plan:  Hypertension  GERD (gastroesophageal reflux disease)  Prediabetes  Constipation  -doing well with BP -constipation recs provided and she is  to follow up if not improving -stil has some back pain from time to time- advised if persists to call and will have her see PMR -follow up in 3 months or as needed -Patient advised to return or notify a doctor immediately if symptoms worsen or persist or new concerns arise.  Patient Instructions  For Constipation: -fiber supplement every morning -if not at goal take 1 capuful in liquid daily for 3 days in a row, if not successful then increase to twice a day until soft bowel movements   Follow up in 3-4 months     KIM, HANNAH R.

## 2013-01-04 ENCOUNTER — Other Ambulatory Visit: Payer: Self-pay | Admitting: Family Medicine

## 2013-02-03 ENCOUNTER — Ambulatory Visit (INDEPENDENT_AMBULATORY_CARE_PROVIDER_SITE_OTHER): Payer: BC Managed Care – PPO | Admitting: Family Medicine

## 2013-02-03 ENCOUNTER — Encounter: Payer: Self-pay | Admitting: Family Medicine

## 2013-02-03 VITALS — BP 110/84 | Temp 98.1°F | Wt 281.0 lb

## 2013-02-03 DIAGNOSIS — M549 Dorsalgia, unspecified: Secondary | ICD-10-CM

## 2013-02-03 MED ORDER — TRAMADOL HCL 50 MG PO TABS: 50.0000 mg | ORAL_TABLET | Freq: Three times a day (TID) | ORAL | Status: DC | PRN

## 2013-02-03 NOTE — Progress Notes (Signed)
Pre visit review using our clinic review tool, if applicable. No additional management support is needed unless otherwise documented below in the visit note. 

## 2013-02-03 NOTE — Patient Instructions (Signed)
-  do the exercises 4 times per week  -tylenol and topical sports creams as directed and ultram if needed  -follow up with your back doctor or with [place] in 4 weeks if not resolved or if worsening

## 2013-02-03 NOTE — Progress Notes (Signed)
Chief Complaint  Patient presents with  . Back Pain    HPI:  Acute visit for:  Back Pain: -long hx intermittent back pain, reports hx of back disc surgery, followed by Dr. Rolena Infante in ortho in the past per her report - last episode of pain about 1 year ago -this flare started 3 days ago -can't think of inciting event -lower thoracic and lumbar paraspinal, constant, non-progressive achy pain -stiff in the morning then better during the day but if moves a certain way worsens -denies: fevers, malaise, bowel or bladder incontinence, numbness or weakness in legs -has been doing zumba and walking and has lost weight   ROS: See pertinent positives and negatives per HPI.  Past Medical History  Diagnosis Date  . Allergy   . Migraine   . Mitral valve prolapse   . Arthritis     in back  . GERD (gastroesophageal reflux disease)   . Hyperlipidemia   . Hypertension     Past Surgical History  Procedure Laterality Date  . Tubal ligation    . Cesarean section    . Back surgery      Family History  Problem Relation Age of Onset  . Hyperlipidemia Mother   . Hypertension Mother   . Stroke Father   . Hyperlipidemia Brother     x2  . Hypertension Brother     x2  . Liver cancer Maternal Aunt     History   Social History  . Marital Status: Married    Spouse Name: N/A    Number of Children: 1  . Years of Education: N/A   Occupational History  .     Social History Main Topics  . Smoking status: Former Smoker    Types: Cigarettes    Quit date: 07/31/1996  . Smokeless tobacco: Never Used  . Alcohol Use: Yes     Comment: very little  . Drug Use: No  . Sexual Activity: None   Other Topics Concern  . None   Social History Narrative  . None    Current outpatient prescriptions:amLODipine (NORVASC) 10 MG tablet, TAKE 1 TABLET DAILY., Disp: 90 tablet, Rfl: 3;  calcium carbonate (OS-CAL) 600 MG TABS, Take 600 mg by mouth 2 (two) times daily with a meal.  , Disp: , Rfl: ;   hydrochlorothiazide (HYDRODIURIL) 25 MG tablet, TAKE 1 TABLET (25 MG TOTAL) BY MOUTH DAILY., Disp: 90 tablet, Rfl: 1;  losartan (COZAAR) 50 MG tablet, Take 1 tablet (50 mg total) by mouth daily., Disp: 90 tablet, Rfl: 3 omeprazole (PRILOSEC) 40 MG capsule, Take 1 capsule (40 mg total) by mouth daily., Disp: 90 capsule, Rfl: 2;  ranitidine (ZANTAC) 150 MG tablet, Take 150 mg by mouth 2 (two) times daily., Disp: , Rfl: ;  valACYclovir (VALTREX) 500 MG tablet, , Disp: , Rfl: ;  traMADol (ULTRAM) 50 MG tablet, Take 1 tablet (50 mg total) by mouth every 8 (eight) hours as needed., Disp: 20 tablet, Rfl: 0  EXAM:  Filed Vitals:   02/03/13 1103  BP: 110/84  Temp: 98.1 F (36.7 C)    Body mass index is 39.21 kg/(m^2).  GENERAL: vitals reviewed and listed above, alert, oriented, appears well hydrated and in no acute distress  HEENT: atraumatic, conjunttiva clear, no obvious abnormalities on inspection of external nose and ears  NECK: no obvious masses on inspection  LUNGS: clear to auscultation bilaterally, no wheezes, rales or rhonchi, good air movement  CV: HRRR, no peripheral edema  MS: moves  all extremities without noticeable abnormality Normal Gait Normal inspection of back, no obvious scoliosis or leg length descrepancy No bony TTP Soft tissue TTP at: QL on L -/+ tests: neg trendelenburg,+facet loading on L, -SLRT, -CLRT, -FABER, -FADIR Normal muscle strength, sensation to light touch and DTRs in LEs bilaterally  PSYCH: pleasant and cooperative, no obvious depression or anxiety  ASSESSMENT AND PLAN:  Discussed the following assessment and plan:  Back pain - Plan: traMADol (ULTRAM) 50 MG tablet  -we discussed possible serious and likely etiologies, workup and treatment, treatment risks and return precautions -after this discussion, Jamie [name] opted for conservative tx and follow up as needed or if not resolved in 4 weeks with me or with her back doctor -of course, we advised Jamie [name]  to  return or notify a doctor immediately if symptoms worsen or persist or new concerns arise.  -Patient advised to return or notify a doctor immediately if symptoms worsen or persist or new concerns arise.  Patient Instructions  -do the exercises 4 times per week  -tylenol and topical sports creams as directed and ultram if needed  -follow up with your back doctor or with [place] in 4 weeks if not resolved or if worsening     KIM, HANNAH R.

## 2013-04-06 ENCOUNTER — Ambulatory Visit (INDEPENDENT_AMBULATORY_CARE_PROVIDER_SITE_OTHER): Payer: BC Managed Care – PPO | Admitting: Family Medicine

## 2013-04-06 ENCOUNTER — Ambulatory Visit (INDEPENDENT_AMBULATORY_CARE_PROVIDER_SITE_OTHER)
Admission: RE | Admit: 2013-04-06 | Discharge: 2013-04-06 | Disposition: A | Discharge status: Home / Self Care | Payer: BC Managed Care – PPO | Source: Ambulatory Visit | Attending: Family Medicine | Admitting: Family Medicine

## 2013-04-06 ENCOUNTER — Encounter: Payer: Self-pay | Admitting: Family Medicine

## 2013-04-06 VITALS — BP 110/74 | Temp 98.9°F | Wt 284.0 lb

## 2013-04-06 DIAGNOSIS — R7303 Prediabetes: Secondary | ICD-10-CM

## 2013-04-06 DIAGNOSIS — M25561 Pain in right knee: Secondary | ICD-10-CM

## 2013-04-06 DIAGNOSIS — K219 Gastro-esophageal reflux disease without esophagitis: Secondary | ICD-10-CM

## 2013-04-06 DIAGNOSIS — M25569 Pain in unspecified knee: Secondary | ICD-10-CM

## 2013-04-06 DIAGNOSIS — R7309 Other abnormal glucose: Secondary | ICD-10-CM

## 2013-04-06 DIAGNOSIS — M549 Dorsalgia, unspecified: Secondary | ICD-10-CM

## 2013-04-06 DIAGNOSIS — I10 Essential (primary) hypertension: Secondary | ICD-10-CM

## 2013-04-06 DIAGNOSIS — E669 Obesity, unspecified: Secondary | ICD-10-CM

## 2013-04-06 LAB — HEMOGLOBIN A1C: HEMOGLOBIN A1C: 6.2 % (ref 4.6–6.5)

## 2013-04-06 LAB — BASIC METABOLIC PANEL
BUN: 11 mg/dL (ref 6–23)
CHLORIDE: 104 meq/L (ref 96–112)
CO2: 29 meq/L (ref 19–32)
CREATININE: 0.9 mg/dL (ref 0.4–1.2)
Calcium: 8.8 mg/dL (ref 8.4–10.5)
GFR: 88.19 mL/min (ref >60.00)
GLUCOSE: 88 mg/dL (ref 70–99)
Potassium: 3.7 mEq/L (ref 3.5–5.1)
Sodium: 139 mEq/L (ref 135–145)

## 2013-04-06 LAB — LIPID PANEL
CHOLESTEROL: 158 mg/dL (ref 0–200)
HDL: 42.7 mg/dL (ref >39.00)
LDL CALC: 89 mg/dL (ref 0–99)
TRIGLYCERIDES: 130 mg/dL (ref 0.0–149.0)
Total CHOL/HDL Ratio: 4
VLDL: 26 mg/dL (ref 0.0–40.0)

## 2013-04-06 NOTE — Progress Notes (Signed)
Chief Complaint  Patient presents with  . Follow-up    HPI:  Follow up:  Low back pain: -hx disc surgery, Dr. Rolena Infante -opted for trial of HEP, heat, pain management last visit -reports: doing much better -denies: persistent pain, weakness, numbness  HTN: -stable on norvasc 10mg , losartan 50mg  and HCTZ 25mg   GERD: -takes omeprazole and ranitidine  Obesity/Prediabetes: -working on weight loss, knee pain is limiting the exercise and diet  R knee pain: -on and off chronically -worse in recent months since doing zumba -worse with walking, sitting, even bothers her at night  -sometimes swells -better with motrin and ice packs -has never had xrays -denies: giving out, weakness, numbness, redness    ROS: See pertinent positives and negatives per HPI.  Past Medical History  Diagnosis Date  . Allergy   . Migraine   . Mitral valve prolapse   . Arthritis     in back  . GERD (gastroesophageal reflux disease)   . Hyperlipidemia   . Hypertension     Past Surgical History  Procedure Laterality Date  . Tubal ligation    . Cesarean section    . Back surgery      Family History  Problem Relation Age of Onset  . Hyperlipidemia Mother   . Hypertension Mother   . Stroke Father   . Hyperlipidemia Brother     x2  . Hypertension Brother     x2  . Liver cancer Maternal Aunt     History   Social History  . Marital Status: Married    Spouse Name: N/A    Number of Children: 1  . Years of Education: N/A   Occupational History  .     Social History Main Topics  . Smoking status: Former Smoker    Types: Cigarettes    Quit date: 07/31/1996  . Smokeless tobacco: Never Used  . Alcohol Use: Yes     Comment: very little  . Drug Use: No  . Sexual Activity: None   Other Topics Concern  . None   Social History Narrative  . None    Current outpatient prescriptions:amLODipine (NORVASC) 10 MG tablet, TAKE 1 TABLET DAILY., Disp: 90 tablet, Rfl: 3;  calcium carbonate  (OS-CAL) 600 MG TABS, Take 600 mg by mouth 2 (two) times daily with a meal.  , Disp: , Rfl: ;  hydrochlorothiazide (HYDRODIURIL) 25 MG tablet, TAKE 1 TABLET (25 MG TOTAL) BY MOUTH DAILY., Disp: 90 tablet, Rfl: 1;  losartan (COZAAR) 50 MG tablet, Take 1 tablet (50 mg total) by mouth daily., Disp: 90 tablet, Rfl: 3 omeprazole (PRILOSEC) 40 MG capsule, Take 1 capsule (40 mg total) by mouth daily., Disp: 90 capsule, Rfl: 2;  ranitidine (ZANTAC) 150 MG tablet, Take 150 mg by mouth 2 (two) times daily., Disp: , Rfl: ;  traMADol (ULTRAM) 50 MG tablet, Take 1 tablet (50 mg total) by mouth every 8 (eight) hours as needed., Disp: 20 tablet, Rfl: 0  EXAM:  Filed Vitals:   04/06/13 0835  BP: 110/74  Temp: 98.9 F (37.2 C)    Body mass index is 39.63 kg/(m^2).  GENERAL: vitals reviewed and listed above, alert, oriented, appears well hydrated and in no acute distress  HEENT: atraumatic, conjunttiva clear, no obvious abnormalities on inspection of external nose and ears  NECK: no obvious masses on inspection  LUNGS: clear to auscultation bilaterally, no wheezes, rales or rhonchi, good air movement  CV: HRRR, no peripheral edema  MS: moves all extremities without  noticeable abnormality Normal Gait  Normal inspection of back, no obvious scoliosis or leg length descrepancy  No bony TTP  Soft tissue TTP at: QL on L  -/+ tests: neg trendelenburg,+facet loading on L, -SLRT, -CLRT, -FABER, -FADIR  Normal muscle strength, sensation to light touch and DTRs in LEs bilaterally R knee - no swelling or erythema -mild TTP along medial jt R knee and along lat edge of patella, neg j sign, neg mcmurry, nec lachman, neg val/var stress, + single leg squat, NV intact distally  PSYCH: pleasant and cooperative, no obvious depression or anxiety  ASSESSMENT AND PLAN:  Discussed the following assessment and plan:  Hypertension - Plan: Basic metabolic panel  Prediabetes - Plan: Hemoglobin A1c  GERD  (gastroesophageal reflux disease)  OBESITY - Plan: Lipid Panel  Back pain  Right knee pain - Plan: DG Knee Complete 4 Views Right  -for knee pain, suspect OA or PFS - discussed etiologies and options, opted for HEP, heat, plain films and follow up in one month if OA may do inj then -back pain resolved -FASTING labs today -Patient advised to return or notify a doctor immediately if symptoms worsen or persist or new concerns arise.  There are no Patient Instructions on file for this visit.   Colin Benton R.

## 2013-04-06 NOTE — Progress Notes (Signed)
Pre visit review using our clinic review tool, if applicable. No additional management support is needed unless otherwise documented below in the visit note. 

## 2013-04-06 NOTE — Patient Instructions (Signed)
-  We have ordered labs or studies at this visit. It can take up to 1-2 weeks for results and processing. We will contact you with instructions IF your results are abnormal. Normal results will be released to your [facility]. If you have not heard from [place] or can not find your results in [facility] in 2 weeks please contact our office.  -For knee:home exercises 4-5 days per week, water aerobics or cycling, follow up 1 month  We recommend the following healthy lifestyle measures: - eat a healthy diet consisting of lots of vegetables, fruits, beans, nuts, seeds, healthy meats such as white chicken and fish and whole grains.  - avoid fried foods, fast food, processed foods, sodas, red meet and other fattening foods.  - get a least 150 minutes of aerobic exercise per week.

## 2013-04-23 ENCOUNTER — Telehealth: Payer: Self-pay | Admitting: Family Medicine

## 2013-04-27 ENCOUNTER — Encounter: Payer: Self-pay | Admitting: Family Medicine

## 2013-04-27 NOTE — Progress Notes (Signed)
Pt request for home health for knee brace, signed and given to assistant to fax.

## 2013-05-06 ENCOUNTER — Encounter: Payer: Self-pay | Admitting: Family Medicine

## 2013-05-06 ENCOUNTER — Ambulatory Visit (INDEPENDENT_AMBULATORY_CARE_PROVIDER_SITE_OTHER): Payer: BC Managed Care – PPO | Admitting: Family Medicine

## 2013-05-06 VITALS — BP 120/84 | HR 82 | Temp 99.3°F | Ht 71.0 in | Wt 282.0 lb

## 2013-05-06 DIAGNOSIS — M1711 Unilateral primary osteoarthritis, right knee: Secondary | ICD-10-CM

## 2013-05-06 DIAGNOSIS — IMO0002 Reserved for concepts with insufficient information to code with codable children: Secondary | ICD-10-CM

## 2013-05-06 DIAGNOSIS — M1710 Unilateral primary osteoarthritis, unspecified knee: Secondary | ICD-10-CM

## 2013-05-06 MED ORDER — METHYLPREDNISOLONE ACETATE 40 MG/ML IJ SUSP
40.0000 mg | Freq: Once | INTRAMUSCULAR | Status: AC
  Administered 2013-05-06: 40 mg via INTRA_ARTICULAR

## 2013-05-06 NOTE — Patient Instructions (Signed)
Knee Injection Joint injections are shots. Your caregiver will place a needle into your knee joint. The needle is used to put medicine into the joint. These shots can be used to help treat different painful knee conditions such as osteoarthritis, bursitis, local flare-ups of rheumatoid arthritis, and pseudogout. Anti-inflammatory medicines such as corticosteroids and anesthetics are the most common medicines used for joint and soft tissue injections.  PROCEDURE  The skin over the kneecap will be cleaned with an antiseptic solution.  Your caregiver will inject a small amount of a local anesthetic (a medicine like Novocaine) just under the skin in the area that was cleaned.  After the area becomes numb, a second injection is done. This second injection usually includes an anesthetic and an anti-inflammatory medicine called a steroid or cortisone. The needle is carefully placed in between the kneecap and the knee, and the medicine is injected into the joint space.  After the injection is done, the needle is removed. Your caregiver may place a bandage over the injection site. The whole procedure takes no more than a couple of minutes. BEFORE THE PROCEDURE  Wash all of the skin around the entire knee area. Try to remove any loose, scaling skin. There is no other specific preparation necessary unless advised otherwise by your caregiver. LET YOUR CAREGIVER KNOW ABOUT:   Allergies.  Medications taken including herbs, eye drops, over the counter medications, and creams.  Use of steroids (by mouth or creams).  Possible pregnancy, if applicable.  Previous problems with anesthetics or Novocaine.  History of blood clots (thrombophlebitis).  History of bleeding or blood problems.  Previous surgery.  Other health problems. RISKS AND COMPLICATIONS Side effects from cortisone shots are rare. They include:   Slight bruising of the skin.  Shrinkage of the normal fatty tissue under the skin where  the shot was given.  Increase in pain after the shot.  Infection.  Weakening of tendons or tendon rupture.  Allergic reaction to the medicine.  Diabetics may have a temporary increase in their blood sugar after a shot.  Cortisone can temporarily weaken the immune system. While receiving these shots, you should not get certain vaccines. Also, avoid contact with anyone who has chickenpox or measles. Especially if you have never had these diseases or have not been previously immunized. Your immune system may not be strong enough to fight off the infection while the cortisone is in your system. AFTER THE PROCEDURE   You can go home after the procedure.  You may need to put ice on the joint 15-20 minutes every 3 or 4 hours until the pain goes away.  You may need to put an elastic bandage on the joint. HOME CARE INSTRUCTIONS   Only take over-the-counter or prescription medicines for pain, discomfort, or fever as directed by your caregiver.  You should avoid stressing the joint. Unless advised otherwise, avoid activities that put a lot of pressure on a knee joint, such as:  Jogging.  Bicycling.  Recreational climbing.  Hiking.  Laying down and elevating the leg/knee above the level of your heart can help to minimize swelling. SEEK MEDICAL CARE IF:   You have repeated or worsening swelling.  There is drainage from the puncture area.  You develop red streaking that extends above or below the site where the needle was inserted. SEEK IMMEDIATE MEDICAL CARE IF:   You develop a fever.  You have pain that gets worse even though you are taking pain medicine.  The area is   red and warm, and you have trouble moving the joint. MAKE SURE YOU:   Understand these instructions.  Will watch your condition.  Will get help right away if you are not doing well or get worse. Document Released: 03/11/2006 Document Revised: 03/12/2011 Document Reviewed: 12/06/2006 [facility] Patient  Information 2014 [place], [place]. Wear and Tear Disorders of the Knee (Arthritis, Osteoarthritis) Everyone will experience wear and tear injuries (arthritis, osteoarthritis) of the knee. These are the changes we all get as we age. They come from the joint stress of daily living. The amount of cartilage damage in your knee and your symptoms determine if you need surgery. Mild problems require approximately two months recovery time. More severe problems take several months to recover. With mild problems, your surgeon may find worn and rough cartilage surfaces. With severe changes, your surgeon may find cartilage that has completely worn away and exposed the bone. Loose bodies of bone and cartilage, bone spurs (excess bone growth), and injuries to the menisci (cushions between the large bones of your leg) are also common. All of these problems can cause pain. For a mild wear and tear problem, rough cartilage may simply need to be shaved and smoothed. For more severe problems with areas of exposed bone, your surgeon may use an instrument for roughing up the bone surfaces to stimulate new cartilage growth. Loose bodies are usually removed. Torn menisci may be trimmed or repaired. ABOUT THE ARTHROSCOPIC PROCEDURE Arthroscopy is a surgical technique. It allows your orthopedic surgeon to diagnose and treat your knee injury with accuracy. The surgeon looks into your knee through a small scope. The scope is like a small (pencil-sized) telescope. Arthroscopy is less invasive than open knee surgery. You can expect a more rapid recovery. After the procedure, you will be moved to a recovery area until most of the effects of the medication have worn off. Your caregiver will discuss the test results with you. RECOVERY The severity of the arthritis and the type of procedure performed will determine recovery time. Other important factors include age, physical condition, medical conditions, and the type of rehabilitation  program. Strengthening your muscles after arthroscopy helps guarantee a better recovery. Follow your caregiver's instructions. Use crutches, rest, elevate, ice, and do knee exercises as instructed. Your caregivers will help you and instruct you with exercises and other physical therapy required to regain your mobility, muscle strength, and functioning following surgery. Only take over-the-counter or prescription medicines for pain, discomfort, or fever as directed by your caregiver.  SEEK MEDICAL CARE IF:   There is increased bleeding (more than a small spot) from the wound.  You notice redness, swelling, or increasing pain in the wound.  Pus is coming from wound.  You develop an unexplained oral temperature above 102 F (38.9 C) , or as your caregiver suggests.  You notice a foul smell coming from the wound or dressing.  You have severe pain with motion of the knee. SEEK IMMEDIATE MEDICAL CARE IF:   You develop a rash.  You have difficulty breathing.  You have any allergic problems. MAKE SURE YOU:   Understand these instructions.  Will watch your condition.  Will get help right away if you are not doing well or get worse. Document Released: 12/16/1999 Document Revised: 03/12/2011 Document Reviewed: 05/14/2007 [facility] Patient Information 2014 [place], [place].

## 2013-05-06 NOTE — Addendum Note (Signed)
Addended by: Monico Blitz T on: 05/06/2013 10:06 AM   Modules accepted: Orders

## 2013-05-06 NOTE — Progress Notes (Signed)
Pre visit review using our clinic review tool, if applicable. No additional management support is needed unless otherwise documented below in the visit note. 

## 2013-05-06 NOTE — Progress Notes (Signed)
No chief complaint on file.   HPI:  OA R Knee: -has not really done the exercises -wants steroid injection -denies: fevers, redness, weakness, numbness  ROS: See pertinent positives and negatives per HPI.  Past Medical History  Diagnosis Date  . Allergy   . Migraine   . Mitral valve prolapse   . Arthritis     in back  . GERD (gastroesophageal reflux disease)   . Hyperlipidemia   . Hypertension     Past Surgical History  Procedure Laterality Date  . Tubal ligation    . Cesarean section    . Back surgery      Family History  Problem Relation Age of Onset  . Hyperlipidemia Mother   . Hypertension Mother   . Stroke Father   . Hyperlipidemia Brother     x2  . Hypertension Brother     x2  . Liver cancer Maternal Aunt     History   Social History  . Marital Status: Married    Spouse Name: N/A    Number of Children: 1  . Years of Education: N/A   Occupational History  .     Social History Main Topics  . Smoking status: Former Smoker    Types: Cigarettes    Quit date: 07/31/1996  . Smokeless tobacco: Never Used  . Alcohol Use: Yes     Comment: very little  . Drug Use: No  . Sexual Activity: None   Other Topics Concern  . None   Social History Narrative  . None    Current outpatient prescriptions:amLODipine (NORVASC) 10 MG tablet, TAKE 1 TABLET DAILY., Disp: 90 tablet, Rfl: 3;  calcium carbonate (OS-CAL) 600 MG TABS, Take 600 mg by mouth 2 (two) times daily with a meal.  , Disp: , Rfl: ;  hydrochlorothiazide (HYDRODIURIL) 25 MG tablet, TAKE 1 TABLET (25 MG TOTAL) BY MOUTH DAILY., Disp: 90 tablet, Rfl: 1;  losartan (COZAAR) 50 MG tablet, Take 1 tablet (50 mg total) by mouth daily., Disp: 90 tablet, Rfl: 3 omeprazole (PRILOSEC) 40 MG capsule, Take 1 capsule (40 mg total) by mouth daily., Disp: 90 capsule, Rfl: 2;  ranitidine (ZANTAC) 150 MG tablet, Take 150 mg by mouth 2 (two) times daily., Disp: , Rfl: ;  traMADol (ULTRAM) 50 MG tablet, Take 1 tablet (50  mg total) by mouth every 8 (eight) hours as needed., Disp: 20 tablet, Rfl: 0  EXAM:  Filed Vitals:   05/06/13 0808  BP: 120/84  Pulse: 82  Temp: 99.3 F (37.4 C)    Body mass index is 39.35 kg/(m^2).  GENERAL: vitals reviewed and listed above, alert, oriented, appears well hydrated and in no acute distress  HEENT: atraumatic, conjunttiva clear, no obvious abnormalities on inspection of external nose and ears  NECK: no obvious masses on inspection  MS: moves all extremities without noticeable abnormality, R knee with jt line TTP, no erythema, swelling or skin rash.  PSYCH: pleasant and cooperative, no obvious depression or anxiety  ASSESSMENT AND PLAN:  Discussed the following assessment and plan:  Osteoarthritis of right knee  Knee Injection: -discussed risks and procedure, informed consent obtained, time out. Tol well. Return precautions. -Patient advised to return or notify a doctor immediately if symptoms worsen or persist or new concerns arise.  Patient Instructions  Knee Injection Joint injections are shots. Your caregiver will place a needle into your knee joint. The needle is used to put medicine into the joint. These shots can be used to help  treat different painful knee conditions such as osteoarthritis, bursitis, local flare-ups of rheumatoid arthritis, and pseudogout. Anti-inflammatory medicines such as corticosteroids and anesthetics are the most common medicines used for joint and soft tissue injections.  PROCEDURE  The skin over the kneecap will be cleaned with an antiseptic solution.  Your caregiver will inject a small amount of a local anesthetic (a medicine like Novocaine) just under the skin in the area that was cleaned.  After the area becomes numb, a second injection is done. This second injection usually includes an anesthetic and an anti-inflammatory medicine called a steroid or cortisone. The needle is carefully placed in between the kneecap and the  knee, and the medicine is injected into the joint space.  After the injection is done, the needle is removed. Your caregiver may place a bandage over the injection site. The whole procedure takes no more than a couple of minutes. BEFORE THE PROCEDURE  Wash all of the skin around the entire knee area. Try to remove any loose, scaling skin. There is no other specific preparation necessary unless advised otherwise by your caregiver. LET YOUR CAREGIVER KNOW ABOUT:   Allergies.  Medications taken including herbs, eye drops, over the counter medications, and creams.  Use of steroids (by mouth or creams).  Possible pregnancy, if applicable.  Previous problems with anesthetics or Novocaine.  History of blood clots (thrombophlebitis).  History of bleeding or blood problems.  Previous surgery.  Other health problems. RISKS AND COMPLICATIONS Side effects from cortisone shots are rare. They include:   Slight bruising of the skin.  Shrinkage of the normal fatty tissue under the skin where the shot was given.  Increase in pain after the shot.  Infection.  Weakening of tendons or tendon rupture.  Allergic reaction to the medicine.  Diabetics may have a temporary increase in their blood sugar after a shot.  Cortisone can temporarily weaken the immune system. While receiving these shots, you should not get certain vaccines. Also, avoid contact with anyone who has chickenpox or measles. Especially if you have never had these diseases or have not been previously immunized. Your immune system may not be strong enough to fight off the infection while the cortisone is in your system. AFTER THE PROCEDURE   You can go home after the procedure.  You may need to put ice on the joint 15-20 minutes every 3 or 4 hours until the pain goes away.  You may need to put an elastic bandage on the joint. HOME CARE INSTRUCTIONS   Only take over-the-counter or prescription medicines for pain, discomfort,  or fever as directed by your caregiver.  You should avoid stressing the joint. Unless advised otherwise, avoid activities that put a lot of pressure on a knee joint, such as:  Jogging.  Bicycling.  Recreational climbing.  Hiking.  Laying down and elevating the leg/knee above the level of your heart can help to minimize swelling. SEEK MEDICAL CARE IF:   You have repeated or worsening swelling.  There is drainage from the puncture area.  You develop red streaking that extends above or below the site where the needle was inserted. SEEK IMMEDIATE MEDICAL CARE IF:   You develop a fever.  You have pain that gets worse even though you are taking pain medicine.  The area is red and warm, and you have trouble moving the joint. MAKE SURE YOU:   Understand these instructions.  Will watch your condition.  Will get help right away if you are  not doing well or get worse. Document Released: 03/11/2006 Document Revised: 03/12/2011 Document Reviewed: 12/06/2006 [facility] Patient Information 2014 [place], [place]. Wear and Tear Disorders of the Knee (Arthritis, Osteoarthritis) Everyone will experience wear and tear injuries (arthritis, osteoarthritis) of the knee. These are the changes we all get as we age. They come from the joint stress of daily living. The amount of cartilage damage in your knee and your symptoms determine if you need surgery. Mild problems require approximately two months recovery time. More severe problems take several months to recover. With mild problems, your surgeon may find worn and rough cartilage surfaces. With severe changes, your surgeon may find cartilage that has completely worn away and exposed the bone. Loose bodies of bone and cartilage, bone spurs (excess bone growth), and injuries to the menisci (cushions between the large bones of your leg) are also common. All of these problems can cause pain. For a mild wear and tear problem, rough cartilage may simply need  to be shaved and smoothed. For more severe problems with areas of exposed bone, your surgeon may use an instrument for roughing up the bone surfaces to stimulate new cartilage growth. Loose bodies are usually removed. Torn menisci may be trimmed or repaired. ABOUT THE ARTHROSCOPIC PROCEDURE Arthroscopy is a surgical technique. It allows your orthopedic surgeon to diagnose and treat your knee injury with accuracy. The surgeon looks into your knee through a small scope. The scope is like a small (pencil-sized) telescope. Arthroscopy is less invasive than open knee surgery. You can expect a more rapid recovery. After the procedure, you will be moved to a recovery area until most of the effects of the medication have worn off. Your caregiver will discuss the test results with you. RECOVERY The severity of the arthritis and the type of procedure performed will determine recovery time. Other important factors include age, physical condition, medical conditions, and the type of rehabilitation program. Strengthening your muscles after arthroscopy helps guarantee a better recovery. Follow your caregiver's instructions. Use crutches, rest, elevate, ice, and do knee exercises as instructed. Your caregivers will help you and instruct you with exercises and other physical therapy required to regain your mobility, muscle strength, and functioning following surgery. Only take over-the-counter or prescription medicines for pain, discomfort, or fever as directed by your caregiver.  SEEK MEDICAL CARE IF:   There is increased bleeding (more than a small spot) from the wound.  You notice redness, swelling, or increasing pain in the wound.  Pus is coming from wound.  You develop an unexplained oral temperature above 102 F (38.9 C) , or as your caregiver suggests.  You notice a foul smell coming from the wound or dressing.  You have severe pain with motion of the knee. SEEK IMMEDIATE MEDICAL CARE IF:   You develop a  rash.  You have difficulty breathing.  You have any allergic problems. MAKE SURE YOU:   Understand these instructions.  Will watch your condition.  Will get help right away if you are not doing well or get worse. Document Released: 12/16/1999 Document Revised: 03/12/2011 Document Reviewed: 05/14/2007 [facility] Patient Information 2014 [place], [place].      Jamie [name]

## 2013-06-02 ENCOUNTER — Encounter: Payer: Self-pay | Admitting: Family Medicine

## 2013-06-02 ENCOUNTER — Ambulatory Visit (INDEPENDENT_AMBULATORY_CARE_PROVIDER_SITE_OTHER): Payer: BC Managed Care – PPO | Admitting: Family Medicine

## 2013-06-02 VITALS — BP 116/80 | HR 84 | Temp 99.0°F | Ht 71.0 in | Wt 280.0 lb

## 2013-06-02 DIAGNOSIS — R0789 Other chest pain: Secondary | ICD-10-CM

## 2013-06-02 NOTE — Progress Notes (Addendum)
No chief complaint on file.   HPI:  Acute visit for:  1) Atypical Chest Pain: -saw GI for this last year and had EGD and colonoscopy for ? GERD - EGD was ok, GERD medications not helping -reports: pain in L chest and shoulder daily and constant for the last few weeks - has had this on and off for years and reports told gerd in the past, but ger medications don't work, occ tingling in L arm -she is worried about her heart  -not worse with activity, nothing makes it better -denies: nausea, vomiting, SOB, wheezing, jaw pain, weakness in arm, acid in throat, reflux, swallowing issues, fevers, malaise, cough  ROS: See pertinent positives and negatives per HPI.  Past Medical History  Diagnosis Date  . Allergy   . Migraine   . Mitral valve prolapse   . Arthritis     in back  . GERD (gastroesophageal reflux disease)   . Hyperlipidemia   . Hypertension     Past Surgical History  Procedure Laterality Date  . Tubal ligation    . Cesarean section    . Back surgery      Family History  Problem Relation Age of Onset  . Hyperlipidemia Mother   . Hypertension Mother   . Stroke Father   . Hyperlipidemia Brother     x2  . Hypertension Brother     x2  . Liver cancer Maternal Aunt     History   Social History  . Marital Status: Married    Spouse Name: N/A    Number of Children: 1  . Years of Education: N/A   Occupational History  .     Social History Main Topics  . Smoking status: Former Smoker    Types: Cigarettes    Quit date: 07/31/1996  . Smokeless tobacco: Never Used  . Alcohol Use: Yes     Comment: very little  . Drug Use: No  . Sexual Activity: None   Other Topics Concern  . None   Social History Narrative  . None    Current outpatient prescriptions:amLODipine (NORVASC) 10 MG tablet, TAKE 1 TABLET DAILY., Disp: 90 tablet, Rfl: 3;  hydrochlorothiazide (HYDRODIURIL) 25 MG tablet, TAKE 1 TABLET (25 MG TOTAL) BY MOUTH DAILY., Disp: 90 tablet, Rfl: 1;   losartan (COZAAR) 50 MG tablet, Take 1 tablet (50 mg total) by mouth daily., Disp: 90 tablet, Rfl: 3;  omeprazole (PRILOSEC) 40 MG capsule, Take 1 capsule (40 mg total) by mouth daily., Disp: 90 capsule, Rfl: 2 ranitidine (ZANTAC) 150 MG tablet, Take 150 mg by mouth 2 (two) times daily., Disp: , Rfl: ;  traMADol (ULTRAM) 50 MG tablet, Take 1 tablet (50 mg total) by mouth every 8 (eight) hours as needed., Disp: 20 tablet, Rfl: 0  EXAM:  Filed Vitals:   06/02/13 1606  BP: 116/80  Pulse: 84  Temp: 99 F (37.2 C)    Body mass index is 39.07 kg/(m^2).  GENERAL: vitals reviewed and listed above, alert, oriented, appears well hydrated and in no acute distress  HEENT: atraumatic, conjunttiva clear, no obvious abnormalities on inspection of external nose and ears  NECK: no obvious masses on inspection, normal ROM and no bony TTP, spurling neg  LUNGS: clear to auscultation bilaterally, no wheezes, rales or rhonchi, good air movement  CV: HRRR, no peripheral edema  MS: moves all extremities without noticeable abnormality - no chest wall or nect or shoulder abnormalities on exam and no TTP  PSYCH: pleasant and  cooperative, no obvious depression or anxiety  ASSESSMENT AND PLAN:  Discussed the following assessment and plan:  Atypical chest pain - Plan: Ambulatory referral to Cardiology, EKG 12-Lead -we discussed possible serious and likely etiologies, workup and treatment, treatment risks and return precautions -this does not sound like reflux, may be musculoskeletal/radicular -after this discussion, Azusena opted for EKG today which looks ok and referral to cardologist per her request, workup/tx for other causes after cardiology evaluation -follow up advised with me after eval with cardiology -of course, we advised Damonica  to return or notify a doctor immediately if symptoms worsen or persist or new concerns arise.  -Patient advised to return or notify a doctor immediately if symptoms worsen  or persist or new concerns arise.  Patient Instructions  -We placed a referral for you as discussed to the cardiologist for your chest pain. It usually takes about 1-2 weeks to process and schedule this referral. If you have not heard from [place] regarding this appointment in 2 weeks please contact our office.  -follow up with me in 1 month      Lucretia Kern

## 2013-06-02 NOTE — Patient Instructions (Signed)
-  We placed a referral for you as discussed to the cardiologist for your chest pain. It usually takes about 1-2 weeks to process and schedule this referral. If you have not heard from [place] regarding this appointment in 2 weeks please contact our office.  -follow up with me in 1 month

## 2013-06-02 NOTE — Progress Notes (Signed)
Pre visit review using our clinic review tool, if applicable. No additional management support is needed unless otherwise documented below in the visit note. 

## 2013-06-04 ENCOUNTER — Ambulatory Visit (INDEPENDENT_AMBULATORY_CARE_PROVIDER_SITE_OTHER): Payer: BC Managed Care – PPO | Admitting: Cardiovascular Disease

## 2013-06-04 ENCOUNTER — Encounter: Payer: Self-pay | Admitting: Cardiovascular Disease

## 2013-06-04 VITALS — BP 138/93 | HR 77 | Ht 71.0 in | Wt 276.4 lb

## 2013-06-04 DIAGNOSIS — R9431 Abnormal electrocardiogram [ECG] [EKG]: Secondary | ICD-10-CM

## 2013-06-04 DIAGNOSIS — K219 Gastro-esophageal reflux disease without esophagitis: Secondary | ICD-10-CM

## 2013-06-04 DIAGNOSIS — I10 Essential (primary) hypertension: Secondary | ICD-10-CM

## 2013-06-04 DIAGNOSIS — R079 Chest pain, unspecified: Secondary | ICD-10-CM | POA: Insufficient documentation

## 2013-06-04 NOTE — Assessment & Plan Note (Signed)
Continue H2 blocker  Discussed weight loss and low carb diet

## 2013-06-04 NOTE — Progress Notes (Signed)
Patient ID: Jamie [name], female   DOB: May 05, 1960, 53 y.o.   MRN: [id number]  53 yo referred by Dr Maudie Mercury.  Has not been seen in over 3 years  2012 had atypical chest pain with normal myovue . Had 2 day stress myovue. Reviewed. HTN response to exercise. No ECG changes Some SSCP. Nulcear images totally normal with EF 65%. Did not  think indigestion is anginal equivalent. Started on BP meds  No DM Too much salt in diet. Nonsmoker. Feels palpitations when laying on left side. No associated diaphoresis, or syncope. Has high carb diet  Chest pain has been almost constant since 5/23.  No trauma or arthritis.  Mamogram ok  She does have some radiation of numbness to left hand worse after sleep.  No known cervical spine issues.  Pain is not worse with exertion Usually compliant with meds but did not take them this am.      ROS: Denies fever, malais, weight loss, blurry vision, decreased visual acuity, cough, sputum, SOB, hemoptysis, pleuritic pain, palpitaitons, heartburn, abdominal pain, melena, lower extremity edema, claudication, or rash.  All other systems reviewed and negative   General: Affect appropriate Overweight black female  HEENT: normal Neck supple with no adenopathy JVP normal no bruits no thyromegaly Lungs clear with no wheezing and good diaphragmatic motion Heart:  S1/S2 no murmur,rub, gallop or click PMI normal Abdomen: benighn, BS positve, no tenderness, no AAA no bruit.  No HSM or HJR Distal pulses intact with no bruits No edema Neuro non-focal Skin warm and dry No muscular weakness  Medications Current Outpatient Prescriptions  Medication Sig Dispense Refill  . amLODipine (NORVASC) 10 MG tablet TAKE 1 TABLET DAILY.  90 tablet  3  . hydrochlorothiazide (HYDRODIURIL) 25 MG tablet TAKE 1 TABLET (25 MG TOTAL) BY MOUTH DAILY.  90 tablet  1  . losartan (COZAAR) 50 MG tablet Take 1 tablet (50 mg total) by mouth daily.  90 tablet  3  . omeprazole (PRILOSEC) 40 MG capsule Take 1  capsule (40 mg total) by mouth daily.  90 capsule  2  . ranitidine (ZANTAC) 150 MG tablet Take 150 mg by mouth 2 (two) times daily.      . traMADol (ULTRAM) 50 MG tablet Take 1 tablet (50 mg total) by mouth every 8 (eight) hours as needed.  20 tablet  0   No current facility-administered medications for this visit.    Allergies Review of patient's allergies indicates no known allergies.  Family History: Family History  Problem Relation Age of Onset  . Hyperlipidemia Mother   . Hypertension Mother   . Stroke Father   . Hyperlipidemia Brother     x2  . Hypertension Brother     x2  . Liver cancer Maternal Aunt     Social History: History   Social History  . Marital Status: Married    Spouse Name: N/A    Number of Children: 1  . Years of Education: N/A   Occupational History  .     Social History Main Topics  . Smoking status: Former Smoker    Types: Cigarettes    Quit date: 07/31/1996  . Smokeless tobacco: Never Used  . Alcohol Use: Yes     Comment: very little  . Drug Use: No  . Sexual Activity: Not on file   Other Topics Concern  . Not on file   Social History Narrative  . No narrative on file    Electrocardiogram:  SR nonspecific  ST/ Twave changes LVH limb lead voltage   Assessment and Plan

## 2013-06-04 NOTE — Telephone Encounter (Signed)
Message made in error

## 2013-06-04 NOTE — Assessment & Plan Note (Signed)
Well controlled.  Continue current medications and low sodium Dash type diet.    

## 2013-06-04 NOTE — Patient Instructions (Signed)
Your physician recommends that you schedule a follow-up appointment in: As needed  Your physician recommends that you continue on your current medications as directed. Please refer to the Current Medication list given to you today. Your physician has requested that you have en exercise stress myoview. For further information please visit [email or web address]. Please follow instruction sheet, as given.   CT  DEPARTMENT   [phone]

## 2013-06-04 NOTE — Assessment & Plan Note (Signed)
Atypical but HTN and abnormal ECG.  Stress myovue  We have baseline normal one from 3 years ago to compare.  Also suggested coronary calcium score to risk stratify to see if further risk factor modification is needed.

## 2013-06-16 ENCOUNTER — Ambulatory Visit (HOSPITAL_COMMUNITY): Payer: BC Managed Care – PPO | Attending: Cardiovascular Disease | Admitting: Radiology

## 2013-06-16 VITALS — BP 142/111 | Ht 71.0 in | Wt 275.0 lb

## 2013-06-16 DIAGNOSIS — I10 Essential (primary) hypertension: Secondary | ICD-10-CM | POA: Insufficient documentation

## 2013-06-16 DIAGNOSIS — R110 Nausea: Secondary | ICD-10-CM

## 2013-06-16 DIAGNOSIS — R002 Palpitations: Secondary | ICD-10-CM | POA: Insufficient documentation

## 2013-06-16 DIAGNOSIS — R9431 Abnormal electrocardiogram [ECG] [EKG]: Secondary | ICD-10-CM | POA: Insufficient documentation

## 2013-06-16 DIAGNOSIS — Z87891 Personal history of nicotine dependence: Secondary | ICD-10-CM | POA: Insufficient documentation

## 2013-06-16 DIAGNOSIS — R0602 Shortness of breath: Secondary | ICD-10-CM | POA: Insufficient documentation

## 2013-06-16 DIAGNOSIS — R079 Chest pain, unspecified: Secondary | ICD-10-CM

## 2013-06-16 MED ORDER — REGADENOSON 0.4 MG/5ML IV SOLN
0.4000 mg | Freq: Once | INTRAVENOUS | Status: AC
  Administered 2013-06-16: 0.4 mg via INTRAVENOUS

## 2013-06-16 MED ORDER — TECHNETIUM TC 99M SESTAMIBI GENERIC - CARDIOLITE
30.0000 | Freq: Once | INTRAVENOUS | Status: AC | PRN
  Administered 2013-06-16: 30 via INTRAVENOUS

## 2013-06-16 MED ORDER — AMINOPHYLLINE 25 MG/ML IV SOLN
75.0000 mg | Freq: Two times a day (BID) | INTRAVENOUS | Status: DC | PRN
  Administered 2013-06-16: 75 mg via INTRAVENOUS

## 2013-06-16 NOTE — Progress Notes (Signed)
[facility] [address] [place], [place] [address] [phone]    Cardiology Nuclear Med Study  Jamie [name] is a 53 y.o. female     MRN : [id number]     DOB: [birth year]  Procedure Date: 06/16/2013  Nuclear Med Background Indication for Stress Test:  Evaluation for Ischemia and Abnormal EKG History:  '12 MPI: EF: 65% mild inferior lateral ischemia Cardiac Risk Factors: History of Smoking and Hypertension  Symptoms:  Chest Pain, Palpitations and SOB   Nuclear Pre-Procedure Caffeine/Decaff Intake:  None NPO After: 9:00pm   Lungs:  clear O2 Sat: 95% on room air. IV 0.9% NS with Angio Cath:  22g  IV Site: R Antecubital  IV Started by:  Perrin Maltese, EMT-P  Chest Size (in):  44 Cup Size: G  Height: 5\' 11"  (1.803 m)  Weight:  275 lb (124.739 kg)  BMI:  Body mass index is 38.37 kg/(m^2). Tech Comments:  NO Rx this am. Aminophylline 150 mg IV given for symptoms. All were resolved before leaving. S.Williams EMTP    Nuclear Med Study 1 or 2 day study: 2 day  Stress Test Type:  Carlton Adam  Reading MD: n/a  Order Authorizing Provider:  P.Nishan MD  Resting Radionuclide: Technetium 8m Sestamibi  Resting Radionuclide Dose: 33.0 mCi  06/17/13  Stress Radionuclide:  Technetium 40m Sestamibi  Stress Radionuclide Dose: 33.0 mCi  06/16/13          Stress Protocol Rest HR: 63 Stress HR: 77  Rest BP: 142/111 Stress BP: 136/84  Exercise Time (min): n/a METS: n/a   Predicted Max HR: 169 bpm % Max HR: 45.56 bpm Rate Pressure Product: 10472   Dose of Adenosine (mg):  n/a Dose of Lexiscan: 0.4 mg  Dose of Atropine (mg): n/a Dose of Dobutamine: n/a mcg/kg/min (at max HR)  Stress Test Technologist: Perrin Maltese, EMT-P  Nuclear Technologist:  Charlton Amor, CNMT     Rest Procedure:  Myocardial perfusion imaging was performed at rest 45 minutes following the intravenous administration of Technetium 42m Sestamibi. Rest ECG: NSR with non-specific ST-T  wave changes  Stress Procedure:  The patient received IV Lexiscan 0.4 mg over 15-seconds.  Technetium 100m Sestamibi injected at 30-seconds. This patient had sob, chest tightness, a headache, pain in her ear, and nausea with the Lexiscan injection. Quantitative spect images were obtained after a 45 minute delay. Stress ECG: No significant change from baseline ECG  QPS Raw Data Images:  There is interference from nuclear activity from structures below the diaphragm. This does not affect the ability to read the study. Stress Images:  There is decreased uptake in the basal and mid inferolateral and basal inferior wall.  Rest Images:  Normal homogeneous uptake in all areas of the myocardium. Subtraction (SDS):  There is medium size, moderate severity reversible defect in the basal and mid inferolateral and basal inferior walls. (SDS6) Transient Ischemic Dilatation (Normal <1.22):  1.07 Lung/Heart Ratio (Normal <0.45):  0.29  Quantitative Gated Spect Images QGS EDV:  108 ml QGS ESV:  32 ml  Impression Exercise Capacity:  Lexiscan with no exercise. BP Response:  Normal blood pressure response. Clinical Symptoms:  Mild chest pain/dyspnea. ECG Impression:  No significant ST segment change suggestive of ischemia. Comparison with Prior Nuclear Study: No significant change from previous study  Overall Impression:  Intermediate risk stress nuclear study with medium size, moderate severity ischemia in the RCA/LCX territory..  LV Ejection Fraction: 70%.  LV Wall Motion:  NL LV Function; NL Wall Motion  Jamie [name] 06/17/2013

## 2013-06-17 ENCOUNTER — Ambulatory Visit (HOSPITAL_COMMUNITY): Payer: BC Managed Care – PPO | Attending: Cardiology

## 2013-06-17 DIAGNOSIS — R0989 Other specified symptoms and signs involving the circulatory and respiratory systems: Secondary | ICD-10-CM

## 2013-06-17 MED ORDER — TECHNETIUM TC 99M SESTAMIBI GENERIC - CARDIOLITE
30.0000 | Freq: Once | INTRAVENOUS | Status: AC | PRN
  Administered 2013-06-17: 30 via INTRAVENOUS

## 2013-06-24 ENCOUNTER — Other Ambulatory Visit: Payer: Self-pay | Admitting: Family Medicine

## 2013-06-25 ENCOUNTER — Other Ambulatory Visit: Payer: Self-pay | Admitting: *Deleted

## 2013-06-25 ENCOUNTER — Encounter: Payer: Self-pay | Admitting: *Deleted

## 2013-06-25 DIAGNOSIS — Z01810 Encounter for preprocedural cardiovascular examination: Secondary | ICD-10-CM

## 2013-06-26 ENCOUNTER — Other Ambulatory Visit: Payer: Self-pay | Admitting: Cardiovascular Disease

## 2013-06-26 DIAGNOSIS — R079 Chest pain, unspecified: Secondary | ICD-10-CM

## 2013-06-30 ENCOUNTER — Encounter (HOSPITAL_COMMUNITY): Payer: Self-pay | Admitting: Pharmacy Technician

## 2013-07-02 ENCOUNTER — Other Ambulatory Visit (INDEPENDENT_AMBULATORY_CARE_PROVIDER_SITE_OTHER): Payer: BC Managed Care – PPO

## 2013-07-02 DIAGNOSIS — Z01810 Encounter for preprocedural cardiovascular examination: Secondary | ICD-10-CM

## 2013-07-02 LAB — CBC WITH DIFFERENTIAL/PLATELET
Basophils Absolute: 0 10*3/uL (ref 0.0–0.1)
Basophils Relative: 0.5 % (ref 0.0–3.0)
EOS PCT: 5.8 % — AB (ref 0.0–5.0)
Eosinophils Absolute: 0.3 10*3/uL (ref 0.0–0.7)
HCT: 36.8 % (ref 36.0–46.0)
HEMOGLOBIN: 12.5 g/dL (ref 12.0–15.0)
LYMPHS ABS: 2.1 10*3/uL (ref 0.7–4.0)
Lymphocytes Relative: 35.6 % (ref 12.0–46.0)
MCHC: 33.9 g/dL (ref 30.0–36.0)
MCV: 85.1 fl (ref 78.0–100.0)
MONOS PCT: 6.6 % (ref 3.0–12.0)
Monocytes Absolute: 0.4 10*3/uL (ref 0.1–1.0)
NEUTROS ABS: 3 10*3/uL (ref 1.4–7.7)
Neutrophils Relative %: 51.5 % (ref 43.0–77.0)
PLATELETS: 286 10*3/uL (ref 150.0–400.0)
RBC: 4.32 Mil/uL (ref 3.87–5.11)
RDW: 15.9 % — ABNORMAL HIGH (ref 11.5–15.5)
WBC: 5.8 10*3/uL (ref 4.0–10.5)

## 2013-07-02 LAB — BASIC METABOLIC PANEL
BUN: 12 mg/dL (ref 6–23)
CHLORIDE: 102 meq/L (ref 96–112)
CO2: 32 meq/L (ref 19–32)
Calcium: 9.2 mg/dL (ref 8.4–10.5)
Creatinine, Ser: 0.9 mg/dL (ref 0.4–1.2)
GFR: 82.6 mL/min (ref >60.00)
Glucose, Bld: 81 mg/dL (ref 70–99)
Potassium: 3.5 mEq/L (ref 3.5–5.1)
SODIUM: 138 meq/L (ref 135–145)

## 2013-07-02 LAB — PROTIME-INR
INR: 1 ratio (ref 0.8–1.0)
PROTHROMBIN TIME: 10.6 s (ref 9.6–13.1)

## 2013-07-03 ENCOUNTER — Other Ambulatory Visit: Payer: Self-pay | Admitting: Family Medicine

## 2013-07-08 ENCOUNTER — Ambulatory Visit (HOSPITAL_COMMUNITY)
Admission: RE | Admit: 2013-07-08 | Discharge: 2013-07-08 | Disposition: A | Discharge status: Home / Self Care | Payer: BC Managed Care – PPO | Source: Ambulatory Visit | Attending: Cardiovascular Disease | Admitting: Cardiovascular Disease

## 2013-07-08 ENCOUNTER — Encounter (HOSPITAL_COMMUNITY)
Admission: RE | Disposition: A | Discharge status: Home / Self Care | Payer: Self-pay | Source: Ambulatory Visit | Attending: Cardiovascular Disease

## 2013-07-08 DIAGNOSIS — Z87891 Personal history of nicotine dependence: Secondary | ICD-10-CM | POA: Insufficient documentation

## 2013-07-08 DIAGNOSIS — R079 Chest pain, unspecified: Secondary | ICD-10-CM

## 2013-07-08 HISTORY — PX: LEFT HEART CATHETERIZATION WITH CORONARY ANGIOGRAM: SHX5451

## 2013-07-08 SURGERY — LEFT HEART CATHETERIZATION WITH CORONARY ANGIOGRAM
Anesthesia: LOCAL

## 2013-07-08 MED ORDER — FENTANYL CITRATE 0.05 MG/ML IJ SOLN
INTRAMUSCULAR | Status: AC
  Filled 2013-07-08: qty 2

## 2013-07-08 MED ORDER — MIDAZOLAM HCL 2 MG/2ML IJ SOLN
INTRAMUSCULAR | Status: AC
  Filled 2013-07-08: qty 2

## 2013-07-08 MED ORDER — VERAPAMIL HCL 2.5 MG/ML IV SOLN
INTRAVENOUS | Status: AC
  Filled 2013-07-08: qty 2

## 2013-07-08 MED ORDER — SODIUM CHLORIDE 0.45 % IV SOLN: INTRAVENOUS | Status: DC

## 2013-07-08 MED ORDER — SODIUM CHLORIDE 0.9 % IJ SOLN: 3.0000 mL | INTRAMUSCULAR | Status: DC | PRN

## 2013-07-08 MED ORDER — SODIUM CHLORIDE 0.9 % IV SOLN: 250.0000 mL | INTRAVENOUS | Status: DC | PRN

## 2013-07-08 MED ORDER — OXYCODONE-ACETAMINOPHEN 5-325 MG PO TABS
ORAL_TABLET | ORAL | Status: AC
  Filled 2013-07-08: qty 2

## 2013-07-08 MED ORDER — OXYCODONE-ACETAMINOPHEN 5-325 MG PO TABS
1.0000 | ORAL_TABLET | ORAL | Status: DC | PRN
  Administered 2013-07-08: 2 via ORAL

## 2013-07-08 MED ORDER — SODIUM CHLORIDE 0.9 % IV SOLN
INTRAVENOUS | Status: DC
  Administered 2013-07-08: 07:00:00 via INTRAVENOUS

## 2013-07-08 MED ORDER — LIDOCAINE HCL (PF) 1 % IJ SOLN
INTRAMUSCULAR | Status: AC
  Filled 2013-07-08: qty 30

## 2013-07-08 MED ORDER — ASPIRIN 81 MG PO CHEW
CHEWABLE_TABLET | ORAL | Status: AC
  Filled 2013-07-08: qty 1

## 2013-07-08 MED ORDER — ASPIRIN 81 MG PO CHEW
81.0000 mg | CHEWABLE_TABLET | ORAL | Status: AC
  Administered 2013-07-08: 81 mg via ORAL

## 2013-07-08 MED ORDER — HEPARIN (PORCINE) IN NACL 2-0.9 UNIT/ML-% IJ SOLN
INTRAMUSCULAR | Status: AC
  Filled 2013-07-08: qty 1000

## 2013-07-08 MED ORDER — SODIUM CHLORIDE 0.9 % IJ SOLN: 3.0000 mL | Freq: Two times a day (BID) | INTRAMUSCULAR | Status: DC

## 2013-07-08 NOTE — H&P (Signed)
52 yo referred by Dr Maudie Mercury. Has not been seen in over 3 years 2012 had atypical chest pain with normal myovue  . Had 2 day stress myovue. Reviewed. HTN response to exercise. No ECG changes Some SSCP. Nulcear images totally normal with EF 65%. Did not think indigestion is anginal equivalent. Started on BP meds No DM Too much salt in diet. Nonsmoker. Feels palpitations when laying on left side. No associated diaphoresis, or syncope. Has high carb diet  Chest pain has been almost constant since 5/23. No trauma or arthritis. Mamogram ok She does have some radiation of numbness to left hand worse after sleep. No known cervical spine issues. Pain is not worse with exertion Usually compliant with meds but did not take them this am.   Myovue done 6/17 abnormal read by Dr Meda Coffee Overall Impression: Intermediate risk stress nuclear study with medium size, moderate severity ischemia in the RCA/LCX territory..  LV Ejection Fraction: 70%. LV Wall Motion: NL LV Function; NL Wall Motion   ROS: Denies fever, malais, weight loss, blurry vision, decreased visual acuity, cough, sputum, SOB, hemoptysis, pleuritic pain, palpitaitons, heartburn, abdominal pain, melena, lower extremity edema, claudication, or rash. All other systems reviewed and negative  General:  Affect appropriate  Overweight black female  HEENT: normal  Neck supple with no adenopathy  JVP normal no bruits no thyromegaly  Lungs clear with no wheezing and good diaphragmatic motion  Heart: S1/S2 no murmur,rub, gallop or click  PMI normal  Abdomen: benighn, BS positve, no tenderness, no AAA  no bruit. No HSM or HJR  Distal pulses intact with no bruits  No edema  Neuro non-focal  Skin warm and dry  No muscular weakness  Medications  Current Outpatient Prescriptions   Medication  Sig  Dispense  Refill   .  amLODipine (NORVASC) 10 MG tablet  TAKE 1 TABLET DAILY.  90 tablet  3   .  hydrochlorothiazide (HYDRODIURIL) 25 MG tablet  TAKE 1 TABLET (25  MG TOTAL) BY MOUTH DAILY.  90 tablet  1   .  losartan (COZAAR) 50 MG tablet  Take 1 tablet (50 mg total) by mouth daily.  90 tablet  3   .  omeprazole (PRILOSEC) 40 MG capsule  Take 1 capsule (40 mg total) by mouth daily.  90 capsule  2   .  ranitidine (ZANTAC) 150 MG tablet  Take 150 mg by mouth 2 (two) times daily.     .  traMADol (ULTRAM) 50 MG tablet  Take 1 tablet (50 mg total) by mouth every 8 (eight) hours as needed.  20 tablet  0    No current facility-administered medications for this visit.   Allergies  Review of patient's allergies indicates no known allergies.  Family History:  Family History   Problem  Relation  Age of Onset   .  Hyperlipidemia  Mother    .  Hypertension  Mother    .  Stroke  Father    .  Hyperlipidemia  Brother      x2   .  Hypertension  Brother      x2   .  Liver cancer  Maternal Aunt    Social History:  History    Social History   .  Marital Status:  Married     Spouse Name:  N/A     Number of Children:  1   .  Years of Education:  N/A    Occupational History   .  Social History Main Topics   .  Smoking status:  Former Smoker     Types:  Cigarettes     Quit date:  07/31/1996   .  Smokeless tobacco:  Never Used   .  Alcohol Use:  Yes      Comment: very little   .  Drug Use:  No   .  Sexual Activity:  Not on file    Other Topics  Concern   .  Not on file    Social History Narrative   .  No narrative on file   Electrocardiogram: SR nonspecific ST/ Twave changes LVH limb lead voltage   Assessment and Plan Chest Pain:  With abnormal myovue suggesting ischemia in RCA and circumflex distribution  Cath today Risks discussed patient willing to proceed  Outpatient labs reviewed and ok   Jenkins Rouge

## 2013-07-08 NOTE — Discharge Instructions (Addendum)
F/U with Dr Johnsie Cancel next available Office will call F/U piimary for other causes of non cardiac chest pain   Arteriogram Care After These instructions give you information on caring for yourself after your procedure. Your doctor may also give you more specific instructions. Call your doctor if you have any problems or questions after your procedure. HOME CARE  Keep your leg straight for at least 6 hours.  Do not bathe, swim, or use a hot tub until directed by your doctor. You can shower.  Do not lift anything heavier than 10 pounds (about a gallon of milk) for 2 days.  Do not walk a lot, run, or drive for 2 days.  Return to normal activities in 2 days or as told by your doctor. Finding out the results of your test Ask when your test results will be ready. Make sure you get your test results. GET HELP RIGHT AWAY IF:   You have fever.  You have more pain in your leg.  The leg that was cut is:  Bleeding.  Puffy (swollen) or red.  Cold.  Pale or changes color.  Weak.  Tingly or numb. If you go to the Emergency Room, tell your nurse that you have had an arteriogram. Take this paper with you to show the nurse. MAKE SURE YOU:  Understand these instructions.  Will watch your condition.  Will get help right away if you are not doing well or get worse. Document Released: 03/16/2008 Document Revised: 12/23/2012 Document Reviewed: 03/16/2008 [facility] Patient Information 2015 [place], [place]. This information is not intended to replace advice given to you by your health care provider. Make sure you discuss any questions you have with your health care provider. Radial Site Care Refer to this sheet in the next few weeks. These instructions provide you with information on caring for yourself after your procedure. Your caregiver may also give you more specific instructions. Your treatment has been planned according to current medical practices, but problems sometimes occur. Call your  caregiver if you have any problems or questions after your procedure. HOME CARE INSTRUCTIONS  You may shower the day after the procedure.Remove the bandage (dressing) and gently wash the site with plain soap and water.Gently pat the site dry.  Do not apply powder or lotion to the site.  Do not submerge the affected site in water for 3 to 5 days.  Inspect the site at least twice daily.  Do not flex or bend the affected arm for 24 hours.  No lifting over 5 pounds (2.3 kg) for 5 days after your procedure.  Do not drive home if you are discharged the same day of the procedure. Have someone else drive you.  You may drive 24 hours after the procedure unless otherwise instructed by your caregiver.  Do not operate machinery or power tools for 24 hours.  A responsible adult should be with you for the first 24 hours after you arrive home. What to expect:  Any bruising will usually fade within 1 to 2 weeks.  Blood that collects in the tissue (hematoma) may be painful to the touch. It should usually decrease in size and tenderness within 1 to 2 weeks. SEEK IMMEDIATE MEDICAL CARE IF:  You have unusual pain at the radial site.  You have redness, warmth, swelling, or pain at the radial site.  You have drainage (other than a small amount of blood on the dressing).  You have chills.  You have a fever or persistent symptoms for  more than 72 hours.  You have a fever and your symptoms suddenly get worse.  Your arm becomes pale, cool, tingly, or numb.  You have heavy bleeding from the site. Hold pressure on the site. Document Released: 01/20/2010 Document Revised: 03/12/2011 Document Reviewed: 01/20/2010 [facility] Patient Information 2015 [place], [place]. This information is not intended to replace advice given to you by your health care provider. Make sure you discuss any questions you have with your health care provider.

## 2013-07-08 NOTE — CV Procedure (Signed)
   Cardiac Catheterization Procedure Note  Name: DELICIA BERENS MRN: [id number] DOB: 1960/06/05  Procedure: Left Heart Cath, Selective Coronary Angiography, LV angiography  Indication:  Abnormal myovue Chest Pain   Procedure.  We iniitally attempted to do the case from right radial.  After access we could not pass a JR4 catheter through the brachial space.  Angiography showed a radial loop.  We then decidied To proceed femorally.   6mg  versed  50 ug fentanyl  No heparin was given.  A radial band was placed on the right arm after femoral sheath inserted   Procedural details: The right groin was prepped, draped, and anesthetized with 1% lidocaine. Using modified Seldinger technique, a 5 French sheath was introduced into the right femoral artery. Standard Judkins catheters were used for coronary angiography and left ventriculography. Catheter exchanges were performed over a guidewire. There were no immediate procedural complications. The patient was transferred to the post catheterization recovery area for further monitoring.  Procedural Findings: Hemodynamics:  AO  130/77 LV  130/11   Coronary angiography: Coronary dominance: right  Left mainstem:  Normal  Left anterior descending (LAD):  Normal large artery that wraps the apex   D1:  Normal  D2: Normal  D3:  Small and normal  Left circumflex (LCx):  Normal and large   OM1:  Normal  OM2:  Normal  Right coronary artery (RCA):  Small  PDA territory more fed by wrap around LAD  Normal  Left ventriculography: Left ventricular systolic function is normal, LVEF is estimated at 55-65%, there is no significant mitral regurgitation   Final Conclusions:   False Positive Myovue  Recommendations:  Medical Rx Likely non cardiac chest pain   Jenkins Rouge 07/08/2013, 9:47 AM

## 2013-07-16 NOTE — Telephone Encounter (Signed)
Rx was sent by Dr Kim. 

## 2013-08-17 ENCOUNTER — Ambulatory Visit (INDEPENDENT_AMBULATORY_CARE_PROVIDER_SITE_OTHER): Payer: BC Managed Care – PPO | Admitting: Family Medicine

## 2013-08-17 ENCOUNTER — Encounter: Payer: Self-pay | Admitting: Family Medicine

## 2013-08-17 VITALS — BP 118/80 | HR 71 | Temp 98.8°F | Ht 71.0 in | Wt 282.0 lb

## 2013-08-17 DIAGNOSIS — M25569 Pain in unspecified knee: Secondary | ICD-10-CM

## 2013-08-17 DIAGNOSIS — M25561 Pain in right knee: Secondary | ICD-10-CM

## 2013-08-17 NOTE — Progress Notes (Signed)
Pre visit review using our clinic review tool, if applicable. No additional management support is needed unless otherwise documented below in the visit note. 

## 2013-08-17 NOTE — Patient Instructions (Signed)
-  We placed a referral for you as discussed to the orthopedic doctor regarding your knee. It usually takes about 1-2 weeks to process and schedule this referral. If you have not heard from [place] regarding this appointment in 2 weeks please contact our office.

## 2013-08-17 NOTE — Progress Notes (Signed)
No chief complaint on file.   HPI:  Acute visit for:  1) Knee Pain: -chronic -mostly in R knee now and worse at night - occ feels like gives away -OA on plain films and steroid inj helped -denies: malaise, weakness, numbness, unintentional weight loss  2)Atyipcal CP: -had extensive workup iwht cardiologist including neg cath due to ? False positive myoview and per notes cards feel not likely cardiac related -denies: SOB, DOE  ROS: See pertinent positives and negatives per HPI.  Past Medical History  Diagnosis Date  . Allergy   . Migraine   . Mitral valve prolapse   . Arthritis     in back  . GERD (gastroesophageal reflux disease)   . Hyperlipidemia   . Hypertension     Past Surgical History  Procedure Laterality Date  . Tubal ligation    . Cesarean section    . Back surgery      Family History  Problem Relation Age of Onset  . Hyperlipidemia Mother   . Hypertension Mother   . Stroke Father   . Hyperlipidemia Brother     x2  . Hypertension Brother     x2  . Liver cancer Maternal Aunt     History   Social History  . Marital Status: Married    Spouse Name: N/A    Number of Children: 1  . Years of Education: N/A   Occupational History  .     Social History Main Topics  . Smoking status: Former Smoker    Types: Cigarettes    Quit date: 07/31/1996  . Smokeless tobacco: Never Used  . Alcohol Use: Yes     Comment: very little  . Drug Use: No  . Sexual Activity: None   Other Topics Concern  . None   Social History Narrative  . None    Current outpatient prescriptions:amLODipine (NORVASC) 10 MG tablet, Take 10 mg by mouth daily., Disp: , Rfl: ;  GLUCOSAMINE HCL PO, Take 1 tablet by mouth 2 (two) times daily as needed (for knee pain)., Disp: , Rfl: ;  hydrochlorothiazide (HYDRODIURIL) 25 MG tablet, TAKE 1 TABLET (25 MG TOTAL) BY MOUTH DAILY., Disp: 90 tablet, Rfl: 1 ibuprofen (ADVIL,MOTRIN) 200 MG tablet, Take 600-800 mg by mouth every 8 (eight)  hours as needed for mild pain., Disp: , Rfl: ;  losartan (COZAAR) 50 MG tablet, Take 50 mg by mouth daily., Disp: , Rfl: ;  omeprazole (PRILOSEC) 40 MG capsule, TAKE 1 CAPSULE (40 MG TOTAL) BY MOUTH DAILY., Disp: 90 capsule, Rfl: 2;  ranitidine (ZANTAC) 150 MG tablet, Take 150 mg by mouth 2 (two) times daily as needed for heartburn. , Disp: , Rfl:   EXAM:  Filed Vitals:   08/17/13 1351  BP: 118/80  Pulse: 71  Temp: 98.8 F (37.1 C)    Body mass index is 39.35 kg/(m^2).  GENERAL: vitals reviewed and listed above, alert, oriented, appears well hydrated and in no acute distress  HEENT: atraumatic, conjunttiva clear, no obvious abnormalities on inspection of external nose and ears  NECK: no obvious masses on inspection  LUNGS: clear to auscultation bilaterally, no wheezes, rales or rhonchi, good air movement  CV: HRRR, no peripheral edema  KNEE: TTP along medial patella R knee, otherwise normal exam, gait normal, no effusion, neg lachman, neg ant/post drawer, neg mcmurry, NV intact distatl  MS: moves all extremities without noticeable abnormality  PSYCH: pleasant and cooperative, no obvious depression or anxiety  ASSESSMENT AND PLAN:  Discussed  the following assessment and plan:  Recurrent knee pain, right - Plan: Ambulatory referral to Orthopedic Surgery, CANCELED: Ambulatory referral to Orthopedic Surgery  -We placed a referral for you as discussed. It usually takes about 1-2 weeks to process and schedule this referral. If you have not heard from [place] regarding this appointment in 2 weeks please contact our office. -Patient advised to return or notify a doctor immediately if symptoms worsen or persist or new concerns arise.  Patient Instructions  -We placed a referral for you as discussed to the orthopedic doctor regarding your knee. It usually takes about 1-2 weeks to process and schedule this referral. If you have not heard from [place] regarding this appointment in 2 weeks please  contact our office.      Jamie Benton R.

## 2013-09-18 ENCOUNTER — Other Ambulatory Visit: Payer: Self-pay | Admitting: Family Medicine

## 2013-09-23 ENCOUNTER — Other Ambulatory Visit: Payer: Self-pay

## 2013-09-23 DIAGNOSIS — Z1231 Encounter for screening mammogram for malignant neoplasm of breast: Secondary | ICD-10-CM

## 2013-10-02 ENCOUNTER — Ambulatory Visit
Admission: RE | Admit: 2013-10-02 | Discharge: 2013-10-02 | Disposition: A | Discharge status: Home / Self Care | Payer: BC Managed Care – PPO | Source: Ambulatory Visit

## 2013-10-02 DIAGNOSIS — Z1231 Encounter for screening mammogram for malignant neoplasm of breast: Secondary | ICD-10-CM

## 2013-12-10 ENCOUNTER — Encounter (HOSPITAL_COMMUNITY): Payer: Self-pay | Admitting: Cardiovascular Disease

## 2013-12-17 ENCOUNTER — Other Ambulatory Visit: Payer: Self-pay | Admitting: Family Medicine

## 2014-01-14 ENCOUNTER — Other Ambulatory Visit: Payer: Self-pay | Admitting: Family Medicine

## 2014-03-14 ENCOUNTER — Other Ambulatory Visit: Payer: Self-pay | Admitting: Family Medicine

## 2014-04-20 ENCOUNTER — Ambulatory Visit (INDEPENDENT_AMBULATORY_CARE_PROVIDER_SITE_OTHER): Payer: BLUE CROSS/BLUE SHIELD | Admitting: Family Medicine

## 2014-04-20 ENCOUNTER — Encounter: Payer: Self-pay | Admitting: Family Medicine

## 2014-04-20 VITALS — BP 116/80 | HR 77 | Temp 98.1°F | Ht 71.0 in | Wt 266.0 lb

## 2014-04-20 DIAGNOSIS — M1711 Unilateral primary osteoarthritis, right knee: Secondary | ICD-10-CM

## 2014-04-20 DIAGNOSIS — Z6837 Body mass index (BMI) 37.0-37.9, adult: Secondary | ICD-10-CM | POA: Diagnosis not present

## 2014-04-20 DIAGNOSIS — M179 Osteoarthritis of knee, unspecified: Secondary | ICD-10-CM | POA: Diagnosis not present

## 2014-04-20 NOTE — Patient Instructions (Signed)
Gentle activities such as cycling  Tylonel 500-1000mg  up to 3 times per day   For flares use the motrin 2 tablets up to twice daily  Follow up with your orthopedic doctor

## 2014-04-20 NOTE — Progress Notes (Signed)
Pre visit review using our clinic review tool, if applicable. No additional management support is needed unless otherwise documented below in the visit note. 

## 2014-04-20 NOTE — Progress Notes (Signed)
HPI:  R knee OA: -reports she saw ortho but did not like him - because they did not use cold spray -reports she did get a shot with ortho but it didn't help much, thinks the shot I gave her help for a few weeks -reports pain in this knee is about the same, intermittent, mod pai in R knee - has improved with exercise - she is cycling -this week did more walking and pain worsened a little -denies: weakness, numbness, clicking popping, giving away  ROS: See pertinent positives and negatives per HPI.  Past Medical History  Diagnosis Date  . Allergy   . Migraine   . Mitral valve prolapse   . Arthritis     in back  . GERD (gastroesophageal reflux disease)   . Hyperlipidemia   . Hypertension     Past Surgical History  Procedure Laterality Date  . Tubal ligation    . Cesarean section    . Back surgery    . Left heart catheterization with coronary angiogram N/A 07/08/2013    Procedure: LEFT HEART CATHETERIZATION WITH CORONARY ANGIOGRAM;  Surgeon: Josue Hector, MD;  Location: [facility] CATH LAB;  Service: Cardiovascular;  Laterality: N/A;    Family History  Problem Relation Age of Onset  . Hyperlipidemia Mother   . Hypertension Mother   . Stroke Father   . Hyperlipidemia Brother     x2  . Hypertension Brother     x2  . Liver cancer Maternal Aunt     History   Social History  . Marital Status: Married    Spouse Name: N/A  . Number of Children: 1  . Years of Education: N/A   Occupational History  .     Social History Main Topics  . Smoking status: Former Smoker    Types: Cigarettes    Quit date: 07/31/1996  . Smokeless tobacco: Never Used  . Alcohol Use: Yes     Comment: very little  . Drug Use: No  . Sexual Activity: Not on file   Other Topics Concern  . None   Social History Narrative     Current outpatient prescriptions:  .  amLODipine (NORVASC) 10 MG tablet, TAKE 1 TABLET DAILY., Disp: 90 tablet, Rfl: 3 .  GLUCOSAMINE HCL PO, Take 1 tablet by mouth 2 (two)  times daily as needed (for knee pain)., Disp: , Rfl:  .  hydrochlorothiazide (HYDRODIURIL) 25 MG tablet, TAKE 1 TABLET (25 MG TOTAL) BY MOUTH DAILY., Disp: 90 tablet, Rfl: 1 .  ibuprofen (ADVIL,MOTRIN) 200 MG tablet, Take 600-800 mg by mouth every 8 (eight) hours as needed for mild pain., Disp: , Rfl:  .  losartan (COZAAR) 50 MG tablet, TAKE 1 TABLET (50 MG TOTAL) BY MOUTH DAILY., Disp: 90 tablet, Rfl: 0 .  omeprazole (PRILOSEC) 40 MG capsule, TAKE 1 CAPSULE (40 MG TOTAL) BY MOUTH DAILY., Disp: 90 capsule, Rfl: 2 .  ranitidine (ZANTAC) 150 MG tablet, Take 150 mg by mouth 2 (two) times daily as needed for heartburn. , Disp: , Rfl:   EXAM:  Filed Vitals:   04/20/14 0804  BP: 116/80  Pulse: 77  Temp: 98.1 F (36.7 C)    Body mass index is 37.12 kg/(m^2).  GENERAL: vitals reviewed and listed above, alert, oriented, appears well hydrated and in no acute distress  HEENT: atraumatic, conjunttiva clear, no obvious abnormalities on inspection of external nose and ears  NECK: no obvious masses on inspection  LUNGS: clear to auscultation bilaterally, no wheezes,  rales or rhonchi, good air movement  CV: HRRR, no peripheral edema  MS: moves all extremities without noticeable abnormality; minimal efusion R knee, medial jt line TTP, neg lachman, neg drawer, neg mcmurry  PSYCH: pleasant and cooperative, no obvious depression or anxiety  ASSESSMENT AND PLAN:  Discussed the following assessment and plan:  Osteoarthritis of right knee, unspecified osteoarthritis type  BMI 37.0-37.9, adult  -discussed options -advised follow up with ortho and she opted to stick with current doctor -follow up here for physical and labs -Patient advised to return or notify a doctor immediately if symptoms worsen or persist or new concerns arise.  Patient Instructions  Gentle activities such as cycling  Tylonel 500-1000mg  up to 3 times per day   For flares use the motrin 2 tablets up to twice  daily  Follow up with your orthopedic doctor     Lucretia Kern.

## 2014-05-03 ENCOUNTER — Other Ambulatory Visit: Payer: Self-pay | Admitting: Family Medicine

## 2014-06-09 ENCOUNTER — Other Ambulatory Visit: Payer: Self-pay | Admitting: Family Medicine

## 2014-07-02 ENCOUNTER — Other Ambulatory Visit: Payer: Self-pay | Admitting: Family Medicine

## 2014-07-10 ENCOUNTER — Other Ambulatory Visit: Payer: Self-pay | Admitting: Family Medicine

## 2014-07-12 ENCOUNTER — Other Ambulatory Visit: Payer: Self-pay | Admitting: Family Medicine

## 2014-08-04 ENCOUNTER — Encounter: Payer: Self-pay | Admitting: Family Medicine

## 2014-08-04 ENCOUNTER — Ambulatory Visit (INDEPENDENT_AMBULATORY_CARE_PROVIDER_SITE_OTHER): Payer: BLUE CROSS/BLUE SHIELD | Admitting: Family Medicine

## 2014-08-04 VITALS — BP 108/78 | HR 65 | Temp 98.5°F | Ht 71.0 in | Wt 269.2 lb

## 2014-08-04 DIAGNOSIS — K219 Gastro-esophageal reflux disease without esophagitis: Secondary | ICD-10-CM | POA: Diagnosis not present

## 2014-08-04 DIAGNOSIS — Z0000 Encounter for general adult medical examination without abnormal findings: Secondary | ICD-10-CM

## 2014-08-04 DIAGNOSIS — E785 Hyperlipidemia, unspecified: Secondary | ICD-10-CM

## 2014-08-04 DIAGNOSIS — R739 Hyperglycemia, unspecified: Secondary | ICD-10-CM | POA: Diagnosis not present

## 2014-08-04 DIAGNOSIS — E669 Obesity, unspecified: Secondary | ICD-10-CM

## 2014-08-04 DIAGNOSIS — I10 Essential (primary) hypertension: Secondary | ICD-10-CM | POA: Diagnosis not present

## 2014-08-04 LAB — BASIC METABOLIC PANEL
BUN: 16 mg/dL (ref 6–23)
CO2: 29 meq/L (ref 19–32)
Calcium: 9.6 mg/dL (ref 8.4–10.5)
Chloride: 105 mEq/L (ref 96–112)
Creatinine, Ser: 1.03 mg/dL (ref 0.40–1.20)
GFR: 72.2 mL/min (ref >60.00)
Glucose, Bld: 102 mg/dL — ABNORMAL HIGH (ref 70–99)
Potassium: 3.5 mEq/L (ref 3.5–5.1)
Sodium: 142 mEq/L (ref 135–145)

## 2014-08-04 LAB — LIPID PANEL
Cholesterol: 203 mg/dL — ABNORMAL HIGH (ref 0–200)
HDL: 50.9 mg/dL (ref >39.00)
LDL CALC: 136 mg/dL — AB (ref 0–99)
NONHDL: 152.25
Total CHOL/HDL Ratio: 4
Triglycerides: 79 mg/dL (ref 0.0–149.0)
VLDL: 15.8 mg/dL (ref 0.0–40.0)

## 2014-08-04 LAB — HEMOGLOBIN A1C: HEMOGLOBIN A1C: 5.9 % (ref 4.6–6.5)

## 2014-08-04 MED ORDER — RANITIDINE HCL 150 MG PO TABS: 150.0000 mg | ORAL_TABLET | Freq: Two times a day (BID) | ORAL | Status: DC

## 2014-08-04 NOTE — Progress Notes (Signed)
Pre visit review using our clinic review tool, if applicable. No additional management support is needed unless otherwise documented below in the visit note. 

## 2014-08-04 NOTE — Progress Notes (Signed)
HPI:  Here for CPE:  -Concerns and/or follow up today:   HTN: -meds:amlodipine 10, hctz 25, losartan 50 -she does have leg cramps, reports her whole life, worse when started strenuous exercise - wants to maybe try decrease -denies:  CP, SOB, DOE, swelling  GERD: -meds: prilosec  -despite take daily ppi is having heartburn almost daily  -she had an EGD in 2014 -denies: dysphagia, SOB, Wheezing, vomiting, weight loss  Obesity: -Diet: variety of foods, balance and well rounded, larger portion sizes - doing more veggies  -Exercise: no regular exercise  -Taking folic acid, vitamin D or calcium: no  -Diabetes and Dyslipidemia Screening: FASTING today  -Hx of HTN: no  -Vaccines: UTD  -pap history: did with Dr. Leo Grosser this year and good  -FDLMP: still having periods, but tapering off  -sexual activity: yes, female partner, no new partners  -wants STI testing (Hep C if born [birth year]): no  -FH breast, colon or ovarian ca: see FH Last mammogram: does [facility] with Dr. Leo Grosser Last colon cancer screening: in 2014   -Alcohol, Tobacco, drug use: see social history  Review of Systems - no fevers, unintentional weight loss, vision loss, hearing loss, chest pain, sob, hemoptysis, melena, hematochezia, hematuria, genital discharge, changing or concerning skin lesions, bleeding, bruising, loc, thoughts of self harm or SI  Past Medical History  Diagnosis Date  . Allergy   . Migraine   . Mitral valve prolapse   . Arthritis     in back  . GERD (gastroesophageal reflux disease)   . Hyperlipidemia   . Hypertension   . Muscle cramps     Past Surgical History  Procedure Laterality Date  . Tubal ligation    . Cesarean section    . Back surgery    . Left heart catheterization with coronary angiogram N/A 07/08/2013    Procedure: LEFT HEART CATHETERIZATION WITH CORONARY ANGIOGRAM;  Surgeon: Josue Hector, MD;  Location: [facility] CATH LAB;  Service: Cardiovascular;  Laterality: N/A;     Family History  Problem Relation Age of Onset  . Hyperlipidemia Mother   . Hypertension Mother   . Stroke Father   . Hyperlipidemia Brother     x2  . Hypertension Brother     x2  . Liver cancer Maternal Aunt     History   Social History  . Marital Status: Married    Spouse Name: N/A  . Number of Children: 1  . Years of Education: N/A   Occupational History  .     Social History Main Topics  . Smoking status: Former Smoker    Types: Cigarettes    Quit date: 07/31/1996  . Smokeless tobacco: Never Used  . Alcohol Use: Yes     Comment: very little  . Drug Use: No  . Sexual Activity: Not on file   Other Topics Concern  . None   Social History Narrative     Current outpatient prescriptions:  .  amLODipine (NORVASC) 10 MG tablet, TAKE 1 TABLET DAILY., Disp: 90 tablet, Rfl: 3 .  hydrochlorothiazide (HYDRODIURIL) 25 MG tablet, TAKE 1 TABLET (25 MG TOTAL) BY MOUTH DAILY., Disp: 90 tablet, Rfl: 0 .  ibuprofen (ADVIL,MOTRIN) 200 MG tablet, Take 600-800 mg by mouth every 8 (eight) hours as needed for mild pain., Disp: , Rfl:  .  losartan (COZAAR) 50 MG tablet, TAKE 1 TABLET (50 MG TOTAL) BY MOUTH DAILY., Disp: 30 tablet, Rfl: 0 .  ranitidine (ZANTAC) 150 MG tablet, Take 1 tablet (  150 mg total) by mouth 2 (two) times daily., Disp: 180 tablet, Rfl: 0  EXAM:  Filed Vitals:   08/04/14 0810  BP: 108/78  Pulse: 65  Temp: 98.5 F (36.9 C)    GENERAL: vitals reviewed and listed below, alert, oriented, appears well hydrated and in no acute distress  HEENT: head atraumatic, PERRLA, normal appearance of eyes, ears, nose and mouth. moist mucus membranes.  NECK: supple, no masses or lymphadenopathy  LUNGS: clear to auscultation bilaterally, no rales, rhonchi or wheeze  CV: HRRR, no peripheral edema or cyanosis, normal pedal pulses  BREAST: declined  ABDOMEN: bowel sounds normal, soft, non tender to palpation, no masses, no rebound or guarding  HC:[id number]  RECTAL:  refused  SKIN: no rash or abnormal lesions  MS: normal gait, moves all extremities normally  NEURO: CN II-XII grossly intact, normal muscle strength and sensation to light touch on extremities  PSYCH: normal affect, pleasant and cooperative  ASSESSMENT AND PLAN:  Discussed the following assessment and plan:  Visit for preventive health examination -Discussed and advised all [place] preventive services health task force level A and B recommendations for age, sex and risks. -Advised at least 150 minutes of exercise per week and a healthy diet low in saturated fats and sweets and consisting of fresh fruits and vegetables, lean meats such as fish and white chicken and whole grains. -FASTING labs, studies and vaccines per orders this encounter  Gastroesophageal reflux disease, esophagitis presence not specified -discussed options, zantac seemed to help in the past so she is going to stop prilosec and try this, then maybe trial of nexium along with advised lifestyle changes -GI eval if persists  Essential hypertension - Plan: Basic metabolic panel -may try trail of decreasing hctz to 1/2 tablet daily to see if helps with chronic leg cramps  Hyperglycemia - Plan: Hemoglobin A1c Hyperlipidemia - Plan: Lipid Panel Obesity -lifestyle recs, labs   Orders Placed This Encounter  Procedures  . Lipid Panel  . Basic metabolic panel  . Hemoglobin A1c    Patient advised to return to clinic immediately if symptoms worsen or persist or new concerns.  Patient Instructions  BEFORE YOU LEAVE: -labs -follow up in 4 months  Stop the prilosec  Try the zantac instead for the acid and the lifestyle changes, call in 2 weeks, if this does not work please try nexium instead (available over the counter) for two weeks  Can try tonic water for leg cramps and can try 1/2 dose hydrochlorthiazide to see if this helps cramps  Please get your flu shot sometime in the next few months and call us to let us  know if you get it done somewhere else    Food Choices for Gastroesophageal Reflux Disease When you have gastroesophageal reflux disease (GERD), the foods you eat and your eating habits are very important. Choosing the right foods can help ease the discomfort of GERD. WHAT GENERAL GUIDELINES DO I NEED TO FOLLOW?  Choose fruits, vegetables, whole grains, low-fat dairy products, and low-fat meat, fish, and poultry.  Limit fats such as oils, salad dressings, butter, nuts, and avocado.  Keep a food diary to identify foods that cause symptoms.  Avoid foods that cause reflux. These may be different for different people.  Eat frequent small meals instead of three large meals each day.  Eat your meals slowly, in a relaxed setting.  Limit fried foods.  Cook foods using methods other than frying.  Avoid drinking alcohol.  Avoid drinking large  amounts of liquids with your meals.  Avoid bending over or lying down until 2-3 hours after eating. WHAT FOODS ARE NOT RECOMMENDED? The following are some foods and drinks that may worsen your symptoms: Vegetables Tomatoes. Tomato juice. Tomato and spaghetti sauce. Chili peppers. Onion and garlic. Horseradish. Fruits Oranges, grapefruit, and lemon (fruit and juice). Meats High-fat meats, fish, and poultry. This includes hot dogs, ribs, ham, sausage, salami, and bacon. Dairy Whole milk and chocolate milk. Sour cream. Cream. Butter. Ice cream. Cream cheese.  Beverages Coffee and tea, with or without caffeine. Carbonated beverages or energy drinks. Condiments Hot sauce. Barbecue sauce.  Sweets/Desserts Chocolate and cocoa. Donuts. Peppermint and spearmint. Fats and Oils High-fat foods, including [place] fries and potato chips. Other Vinegar. Strong spices, such as black pepper, white pepper, red pepper, cayenne, curry powder, cloves, ginger, and chili powder. The items listed above may not be a complete list of foods and beverages to avoid.  Contact your dietitian for more information. Document Released: 12/18/2004 Document Revised: 12/23/2012 Document Reviewed: 10/22/2012 [facility] Patient Information 2015 [place], [place]. This information is not intended to replace advice given to you by your health care provider. Make sure you discuss any questions you have with your health care provider.     Return in about 3 months (around 11/04/2014) for follow up.  Colin Benton R.

## 2014-08-04 NOTE — Patient Instructions (Addendum)
BEFORE YOU LEAVE: -labs -follow up in 4 months  Stop the prilosec  Try the zantac instead for the acid and the lifestyle changes, call in 2 weeks, if this does not work please try nexium instead (available over the counter) for two weeks  Can try tonic water for leg cramps and can try 1/2 dose hydrochlorthiazide to see if this helps cramps  Please get your flu shot sometime in the next few months and call us to let us know if you get it done somewhere else    Food Choices for Gastroesophageal Reflux Disease When you have gastroesophageal reflux disease (GERD), the foods you eat and your eating habits are very important. Choosing the right foods can help ease the discomfort of GERD. WHAT GENERAL GUIDELINES DO I NEED TO FOLLOW?  Choose fruits, vegetables, whole grains, low-fat dairy products, and low-fat meat, fish, and poultry.  Limit fats such as oils, salad dressings, butter, nuts, and avocado.  Keep a food diary to identify foods that cause symptoms.  Avoid foods that cause reflux. These may be different for different people.  Eat frequent small meals instead of three large meals each day.  Eat your meals slowly, in a relaxed setting.  Limit fried foods.  Cook foods using methods other than frying.  Avoid drinking alcohol.  Avoid drinking large amounts of liquids with your meals.  Avoid bending over or lying down until 2-3 hours after eating. WHAT FOODS ARE NOT RECOMMENDED? The following are some foods and drinks that may worsen your symptoms: Vegetables Tomatoes. Tomato juice. Tomato and spaghetti sauce. Chili peppers. Onion and garlic. Horseradish. Fruits Oranges, grapefruit, and lemon (fruit and juice). Meats High-fat meats, fish, and poultry. This includes hot dogs, ribs, ham, sausage, salami, and bacon. Dairy Whole milk and chocolate milk. Sour cream. Cream. Butter. Ice cream. Cream cheese.  Beverages Coffee and tea, with or without caffeine. Carbonated  beverages or energy drinks. Condiments Hot sauce. Barbecue sauce.  Sweets/Desserts Chocolate and cocoa. Donuts. Peppermint and spearmint. Fats and Oils High-fat foods, including [place] fries and potato chips. Other Vinegar. Strong spices, such as black pepper, white pepper, red pepper, cayenne, curry powder, cloves, ginger, and chili powder. The items listed above may not be a complete list of foods and beverages to avoid. Contact your dietitian for more information. Document Released: 12/18/2004 Document Revised: 12/23/2012 Document Reviewed: 10/22/2012 [facility] Patient Information 2015 [place], [place]. This information is not intended to replace advice given to you by your health care provider. Make sure you discuss any questions you have with your health care provider.

## 2014-08-06 MED ORDER — PRAVASTATIN SODIUM 40 MG PO TABS: 40.0000 mg | ORAL_TABLET | Freq: Every day | ORAL | Status: DC

## 2014-08-06 NOTE — Addendum Note (Signed)
Addended by: Agnes Lawrence on: 08/06/2014 02:03 PM   Modules accepted: Orders

## 2014-08-07 ENCOUNTER — Other Ambulatory Visit: Payer: Self-pay | Admitting: Family Medicine

## 2014-08-18 ENCOUNTER — Ambulatory Visit (INDEPENDENT_AMBULATORY_CARE_PROVIDER_SITE_OTHER): Payer: BLUE CROSS/BLUE SHIELD | Admitting: Family Medicine

## 2014-08-18 ENCOUNTER — Encounter: Payer: Self-pay | Admitting: *Deleted

## 2014-08-18 VITALS — BP 124/86 | HR 65

## 2014-08-18 DIAGNOSIS — I10 Essential (primary) hypertension: Secondary | ICD-10-CM

## 2014-08-18 NOTE — Progress Notes (Signed)
Nurse visit

## 2014-09-17 ENCOUNTER — Other Ambulatory Visit: Payer: Self-pay

## 2014-09-17 DIAGNOSIS — Z1231 Encounter for screening mammogram for malignant neoplasm of breast: Secondary | ICD-10-CM

## 2014-09-24 ENCOUNTER — Encounter: Payer: Self-pay | Admitting: Family Medicine

## 2014-09-24 ENCOUNTER — Ambulatory Visit (INDEPENDENT_AMBULATORY_CARE_PROVIDER_SITE_OTHER): Payer: BLUE CROSS/BLUE SHIELD | Admitting: Family Medicine

## 2014-09-24 VITALS — BP 104/76 | HR 82 | Temp 98.2°F | Ht 71.0 in | Wt 277.5 lb

## 2014-09-24 DIAGNOSIS — J069 Acute upper respiratory infection, unspecified: Secondary | ICD-10-CM | POA: Diagnosis not present

## 2014-09-24 DIAGNOSIS — Z23 Encounter for immunization: Secondary | ICD-10-CM | POA: Diagnosis not present

## 2014-09-24 MED ORDER — BENZONATATE 100 MG PO CAPS: 100.0000 mg | ORAL_CAPSULE | Freq: Two times a day (BID) | ORAL | Status: DC | PRN

## 2014-09-24 NOTE — Progress Notes (Signed)
Pre visit review using our clinic review tool, if applicable. No additional management support is needed unless otherwise documented below in the visit note. 

## 2014-09-24 NOTE — Patient Instructions (Signed)
BEFORE YOU LEAVE: -flu shot  I sent the cough medication to the pharmacy to try  INSTRUCTIONS FOR UPPER RESPIRATORY INFECTION:  -plenty of rest and fluids  -nasal saline wash 2-3 times daily (use prepackaged nasal saline or bottled/distilled water if making your own)   -can use AFRIN nasal spray for drainage and nasal congestion - but do NOT use longer then 3-4 days  -can use tylenol (in no history of liver disease)  as directed for aches and sorethroat  -in the winter time, using a humidifier at night is helpful (please follow cleaning instructions)  -if you are taking a cough medication - use only as directed, may also try a teaspoon of honey to coat the throat and throat lozenges. If given a cough medication with codeine or hydrocodone or other narcotic please be advised that this contains a strong and  potentially addicting medication. Please follow instructions carefully, take as little as possible and only use AS NEEDED for severe cough. Discuss potential side effects with your pharmacy. Please do not drive or operate machinery while taking these types of medications. Please do not take other sedating medications, drugs or alcohol while taking this medication without discussing with your doctor.  -for sore throat, salt water gargles can help  -follow up if you have fevers, facial pain, tooth pain, difficulty breathing or are worsening or symptoms persist longer then expected  Upper Respiratory Infection, Adult An upper respiratory infection (URI) is also known as the common cold. It is often caused by a type of germ (virus). Colds are easily spread (contagious). You can pass it to others by kissing, coughing, sneezing, or drinking out of the same glass. Usually, you get better in 1 to 3  weeks.  However, the cough can last for even longer. HOME CARE   Only take medicine as told by your doctor. Follow instructions provided above.  Drink enough water and fluids to keep your pee  (urine) clear or pale yellow.  Get plenty of rest.  Return to work when your temperature is < 100 for 24 hours or as told by your doctor. You may use a face mask and wash your hands to stop your cold from spreading. GET HELP RIGHT AWAY IF:   After the first 7-10 days, you feel you are getting worse.  You have questions about your medicine.  You have chills, shortness of breath, or red spit (mucus).  You have pain in the face for more then 1-2 days, especially when you bend forward.  You have a fever, puffy (swollen) neck, pain when you swallow, or white spots in the back of your throat.  You have a bad headache, ear pain, sinus pain, or chest pain.  You have a high-pitched whistling sound when you breathe in and out (wheezing).  You cough up blood.  You have sore muscles or a stiff neck. MAKE SURE YOU:   Understand these instructions.  Will watch your condition.  Will get help right away if you are not doing well or get worse. Document Released: 06/06/2007 Document Revised: 03/12/2011 Document Reviewed: 03/25/2013 [facility] Patient Information 2015 [place], [place]. This information is not intended to replace advice given to you by your health care Sherian Valenza. Make sure you discuss any questions you have with your health care Xaivier Malay.

## 2014-09-24 NOTE — Progress Notes (Signed)
HPI:  -started: -symptoms:nasal congestion, sore throat, cough, cough is making it difficult to sleep at night and has had some mild body aches -denies:fever, SOB, NVD, tooth pain, sinus pain -has tried: nothing -sick contacts/travel/risks: multiple co-workers with the same -Hx of: allergies ROS: See pertinent positives and negatives per HPI.  Past Medical History  Diagnosis Date  . Allergy   . Migraine   . Mitral valve prolapse   . Arthritis     in back  . GERD (gastroesophageal reflux disease)   . Hyperlipidemia   . Hypertension   . Muscle cramps     Past Surgical History  Procedure Laterality Date  . Tubal ligation    . Cesarean section    . Back surgery    . Left heart catheterization with coronary angiogram N/A 07/08/2013    Procedure: LEFT HEART CATHETERIZATION WITH CORONARY ANGIOGRAM;  Surgeon: Josue Hector, MD;  Location: [facility] CATH LAB;  Service: Cardiovascular;  Laterality: N/A;    Family History  Problem Relation Age of Onset  . Hyperlipidemia Mother   . Hypertension Mother   . Stroke Father   . Hyperlipidemia Brother     x2  . Hypertension Brother     x2  . Liver cancer Maternal Aunt     Social History   Social History  . Marital Status: Married    Spouse Name: N/A  . Number of Children: 1  . Years of Education: N/A   Occupational History  .     Social History Main Topics  . Smoking status: Former Smoker    Types: Cigarettes    Quit date: 07/31/1996  . Smokeless tobacco: Never Used  . Alcohol Use: Yes     Comment: very little  . Drug Use: No  . Sexual Activity: Not Asked   Other Topics Concern  . None   Social History Narrative     Current outpatient prescriptions:  .  amLODipine (NORVASC) 10 MG tablet, TAKE 1 TABLET DAILY., Disp: 90 tablet, Rfl: 3 .  hydrochlorothiazide (HYDRODIURIL) 25 MG tablet, TAKE 1 TABLET (25 MG TOTAL) BY MOUTH DAILY., Disp: 90 tablet, Rfl: 0 .  ibuprofen (ADVIL,MOTRIN) 200 MG tablet, Take 600-800 mg by  mouth every 8 (eight) hours as needed for mild pain., Disp: , Rfl:  .  losartan (COZAAR) 50 MG tablet, TAKE 1 TABLET (50 MG TOTAL) BY MOUTH DAILY., Disp: 30 tablet, Rfl: 3 .  pravastatin (PRAVACHOL) 40 MG tablet, Take 1 tablet (40 mg total) by mouth daily., Disp: 30 tablet, Rfl: 3 .  ranitidine (ZANTAC) 150 MG tablet, Take 1 tablet (150 mg total) by mouth 2 (two) times daily., Disp: 180 tablet, Rfl: 0 .  benzonatate (TESSALON) 100 MG capsule, Take 1 capsule (100 mg total) by mouth 2 (two) times daily as needed for cough., Disp: 20 capsule, Rfl: 0  EXAM:  Filed Vitals:   09/24/14 1406  BP: 104/76  Pulse: 82  Temp: 98.2 F (36.8 C)    Body mass index is 38.72 kg/(m^2).  GENERAL: vitals reviewed and listed above, alert, oriented, appears well hydrated and in no acute distress  HEENT: atraumatic, conjunttiva clear, no obvious abnormalities on inspection of external nose and ears, normal appearance of ear canals and TMs, clear nasal congestion, mild post oropharyngeal erythema with PND, no tonsillar edema or exudate, no sinus TTP  NECK: no obvious masses on inspection  LUNGS: clear to auscultation bilaterally, no wheezes, rales or rhonchi, good air movement  CV: HRRR, no  peripheral edema  MS: moves all extremities without noticeable abnormality  PSYCH: pleasant and cooperative, no obvious depression or anxiety  ASSESSMENT AND PLAN:  Discussed the following assessment and plan:  Acute upper respiratory infection - Plan: benzonatate (TESSALON) 100 MG capsule  -given HPI and exam findings today, a serious infection or illness is unlikely. We discussed potential etiologies, with VURI being most likely, and advised supportive care and monitoring. We discussed treatment side effects, likely course, antibiotic misuse, transmission, and signs of developing a serious illness. -trial tessalon perles for cough -of course, we advised to return or notify a doctor immediately if symptoms worsen  or persist or new concerns arise.    Patient Instructions  BEFORE YOU LEAVE: -flu shot  I sent the cough medication to the pharmacy to try  INSTRUCTIONS FOR UPPER RESPIRATORY INFECTION:  -plenty of rest and fluids  -nasal saline wash 2-3 times daily (use prepackaged nasal saline or bottled/distilled water if making your own)   -can use AFRIN nasal spray for drainage and nasal congestion - but do NOT use longer then 3-4 days  -can use tylenol (in no history of liver disease)  as directed for aches and sorethroat  -in the winter time, using a humidifier at night is helpful (please follow cleaning instructions)  -if you are taking a cough medication - use only as directed, may also try a teaspoon of honey to coat the throat and throat lozenges. If given a cough medication with codeine or hydrocodone or other narcotic please be advised that this contains a strong and  potentially addicting medication. Please follow instructions carefully, take as little as possible and only use AS NEEDED for severe cough. Discuss potential side effects with your pharmacy. Please do not drive or operate machinery while taking these types of medications. Please do not take other sedating medications, drugs or alcohol while taking this medication without discussing with your doctor.  -for sore throat, salt water gargles can help  -follow up if you have fevers, facial pain, tooth pain, difficulty breathing or are worsening or symptoms persist longer then expected  Upper Respiratory Infection, Adult An upper respiratory infection (URI) is also known as the common cold. It is often caused by a type of germ (virus). Colds are easily spread (contagious). You can pass it to others by kissing, coughing, sneezing, or drinking out of the same glass. Usually, you get better in 1 to 3  weeks.  However, the cough can last for even longer. HOME CARE   Only take medicine as told by your doctor. Follow instructions  provided above.  Drink enough water and fluids to keep your pee (urine) clear or pale yellow.  Get plenty of rest.  Return to work when your temperature is < 100 for 24 hours or as told by your doctor. You may use a face mask and wash your hands to stop your cold from spreading. GET HELP RIGHT AWAY IF:   After the first 7-10 days, you feel you are getting worse.  You have questions about your medicine.  You have chills, shortness of breath, or red spit (mucus).  You have pain in the face for more then 1-2 days, especially when you bend forward.  You have a fever, puffy (swollen) neck, pain when you swallow, or white spots in the back of your throat.  You have a bad headache, ear pain, sinus pain, or chest pain.  You have a high-pitched whistling sound when you breathe in and out (  wheezing).  You cough up blood.  You have sore muscles or a stiff neck. MAKE SURE YOU:   Understand these instructions.  Will watch your condition.  Will get help right away if you are not doing well or get worse. Document Released: 06/06/2007 Document Revised: 03/12/2011 Document Reviewed: 03/25/2013 [facility] Patient Information 2015 [place], [place]. This information is not intended to replace advice given to you by your health care provider. Make sure you discuss any questions you have with your health care provider.      Colin Benton R.

## 2014-10-06 ENCOUNTER — Other Ambulatory Visit: Payer: Self-pay | Admitting: Family Medicine

## 2014-10-26 ENCOUNTER — Ambulatory Visit
Admission: RE | Admit: 2014-10-26 | Discharge: 2014-10-26 | Disposition: A | Discharge status: Home / Self Care | Payer: BLUE CROSS/BLUE SHIELD | Source: Ambulatory Visit

## 2014-10-26 DIAGNOSIS — Z1231 Encounter for screening mammogram for malignant neoplasm of breast: Secondary | ICD-10-CM

## 2014-12-06 ENCOUNTER — Ambulatory Visit (INDEPENDENT_AMBULATORY_CARE_PROVIDER_SITE_OTHER): Payer: BLUE CROSS/BLUE SHIELD | Admitting: Family Medicine

## 2014-12-06 ENCOUNTER — Encounter: Payer: Self-pay | Admitting: Family Medicine

## 2014-12-06 VITALS — BP 124/82 | HR 79 | Temp 98.6°F | Ht 71.0 in | Wt 278.7 lb

## 2014-12-06 DIAGNOSIS — E785 Hyperlipidemia, unspecified: Secondary | ICD-10-CM

## 2014-12-06 DIAGNOSIS — K219 Gastro-esophageal reflux disease without esophagitis: Secondary | ICD-10-CM

## 2014-12-06 DIAGNOSIS — E669 Obesity, unspecified: Secondary | ICD-10-CM

## 2014-12-06 DIAGNOSIS — I10 Essential (primary) hypertension: Secondary | ICD-10-CM

## 2014-12-06 MED ORDER — AMLODIPINE BESYLATE 10 MG PO TABS: 10.0000 mg | ORAL_TABLET | Freq: Every day | ORAL | Status: DC

## 2014-12-06 MED ORDER — LOSARTAN POTASSIUM 50 MG PO TABS: 50.0000 mg | ORAL_TABLET | Freq: Every day | ORAL | Status: DC

## 2014-12-06 NOTE — Addendum Note (Signed)
Addended by: Agnes Lawrence on: 12/06/2014 08:49 AM   Modules accepted: Orders

## 2014-12-06 NOTE — Progress Notes (Signed)
HPI:  HTN: -meds:amlodipine 10, hctz 25, losartan 50 -she does have leg cramps, reports her whole life, worse when started strenuous exercise - wants to maybe try decrease -denies: CP, SOB, DOE, swelling  GERD: -meds: now using ranitidine  -she had an EGD in 2014 -denies: dysphagia, SOB, Wheezing, vomiting, weight loss  Obesity/HLD: -no regular exercise -diet ok -stopped taking statin - ran out and forgot to pick up   ROS: See pertinent positives and negatives per HPI.  Past Medical History  Diagnosis Date  . Allergy   . Migraine   . Mitral valve prolapse   . Arthritis     in back  . GERD (gastroesophageal reflux disease)   . Hyperlipidemia   . Hypertension   . Muscle cramps     Past Surgical History  Procedure Laterality Date  . Tubal ligation    . Cesarean section    . Back surgery    . Left heart catheterization with coronary angiogram N/A 07/08/2013    Procedure: LEFT HEART CATHETERIZATION WITH CORONARY ANGIOGRAM;  Surgeon: Josue Hector, MD;  Location: [facility] CATH LAB;  Service: Cardiovascular;  Laterality: N/A;    Family History  Problem Relation Age of Onset  . Hyperlipidemia Mother   . Hypertension Mother   . Stroke Father   . Hyperlipidemia Brother     x2  . Hypertension Brother     x2  . Liver cancer Maternal Aunt     Social History   Social History  . Marital Status: Married    Spouse Name: N/A  . Number of Children: 1  . Years of Education: N/A   Occupational History  .     Social History Main Topics  . Smoking status: Former Smoker    Types: Cigarettes    Quit date: 07/31/1996  . Smokeless tobacco: Never Used  . Alcohol Use: Yes     Comment: very little  . Drug Use: No  . Sexual Activity: Not Asked   Other Topics Concern  . None   Social History Narrative     Current outpatient prescriptions:  .  amLODipine (NORVASC) 10 MG tablet, TAKE 1 TABLET DAILY., Disp: 90 tablet, Rfl: 3 .  hydrochlorothiazide (HYDRODIURIL) 25 MG  tablet, TAKE 1 TABLET (25 MG TOTAL) BY MOUTH DAILY., Disp: 90 tablet, Rfl: 0 .  ibuprofen (ADVIL,MOTRIN) 200 MG tablet, Take 600-800 mg by mouth every 8 (eight) hours as needed for mild pain., Disp: , Rfl:  .  losartan (COZAAR) 50 MG tablet, TAKE 1 TABLET (50 MG TOTAL) BY MOUTH DAILY., Disp: 30 tablet, Rfl: 3 .  pravastatin (PRAVACHOL) 40 MG tablet, Take 1 tablet (40 mg total) by mouth daily., Disp: 30 tablet, Rfl: 3 .  ranitidine (ZANTAC) 150 MG tablet, Take 1 tablet (150 mg total) by mouth 2 (two) times daily., Disp: 180 tablet, Rfl: 0  EXAM:  Filed Vitals:   12/06/14 0810  BP: 124/82  Pulse: 79  Temp: 98.6 F (37 C)    Body mass index is 38.89 kg/(m^2).  GENERAL: vitals reviewed and listed above, alert, oriented, appears well hydrated and in no acute distress  HEENT: atraumatic, conjunttiva clear, no obvious abnormalities on inspection of external nose and ears  NECK: no obvious masses on inspection  LUNGS: clear to auscultation bilaterally, no wheezes, rales or rhonchi, good air movement  CV: HRRR, no peripheral edema  MS: moves all extremities without noticeable abnormality  PSYCH: pleasant and cooperative, no obvious depression or anxiety  ASSESSMENT AND  PLAN:  Discussed the following assessment and plan:  Essential hypertension  Hyperlipemia  Gastroesophageal reflux disease, esophagitis presence not specified  Obesity  -Patient advised to return or notify a doctor immediately if symptoms worsen or persist or new concerns arise.  Patient Instructions  BEFORE YOU LEAVE: -schedule follow up in 4-6 months  RESTART the cholesterol medication (Pravastatin)  We recommend the following healthy lifestyle measures: - eat a healthy whole foods diet consisting of regular small meals composed of vegetables, fruits, beans, nuts, seeds, healthy meats such as white chicken and fish and whole grains.  - avoid sweets, white starchy foods, fried foods, fast food, processed  foods, sodas, red meet and other fattening foods.  - get a least 150-300 minutes of aerobic exercise per week.       Colin Benton R.

## 2014-12-06 NOTE — Progress Notes (Signed)
Pre visit review using our clinic review tool, if applicable. No additional management support is needed unless otherwise documented below in the visit note. 

## 2014-12-06 NOTE — Patient Instructions (Signed)
BEFORE YOU LEAVE: -schedule follow up in 4-6 months  RESTART the cholesterol medication (Pravastatin)  We recommend the following healthy lifestyle measures: - eat a healthy whole foods diet consisting of regular small meals composed of vegetables, fruits, beans, nuts, seeds, healthy meats such as white chicken and fish and whole grains.  - avoid sweets, white starchy foods, fried foods, fast food, processed foods, sodas, red meet and other fattening foods.  - get a least 150-300 minutes of aerobic exercise per week.

## 2014-12-07 ENCOUNTER — Other Ambulatory Visit: Payer: Self-pay | Admitting: Family Medicine

## 2015-01-12 ENCOUNTER — Telehealth: Payer: Self-pay | Admitting: Family Medicine

## 2015-01-12 NOTE — Telephone Encounter (Signed)
I don't understand from the notes the outcome of nurse triage on this pt. Jamie [name], could you speak with pt and further triage and assist? Help her with sooner appt if new symptoms, concerning.Marland KitchenMarland KitchenThank you. I am out of the office today.

## 2015-01-12 NOTE — Telephone Encounter (Signed)
Patient Name: Jamie [name] DOB: 26-Nov-1960 Initial Comment Caller states has been feeling light headed and dizzy Nurse Assessment Nurse: Marcelline Deist, RN, Lynda Date/Time (Eastern Time): 01/12/2015 12:19:16 PM Confirm and document reason for call. If symptomatic, describe symptoms. ---Caller states has been feeling light headed and dizzy since Sat. Has had some off & on headache. Has the patient traveled out of the country within the last 30 days? ---Not Applicable Does the patient have any new or worsening symptoms? ---Yes Will a triage be completed? ---Yes Related visit to physician within the last 2 weeks? ---No Does the PT have any chronic conditions? (i.e. diabetes, asthma, etc.) ---Yes List chronic conditions. ---on BP rx, water pill, back issues Did the patient indicate they were pregnant? ---No Is this a behavioral health or substance abuse call? ---No Guidelines Guideline Title Affirmed Question Affirmed Notes Dizziness - Lightheadedness [1] MODERATE dizziness (e.g., interferes with normal activities) AND [2] has NOT been evaluated by physician for this (Exception: dizziness caused by heat exposure, sudden standing, or poor fluid intake) Final Disposition User See Physician within [place], RN, [employer] states she has an appt. on Monday. Has not been checking BP readings lately. Is staying hydrated. No recent changes to her BP rx. Referrals REFERRED TO PCP OFFICE Disagree/Comply: Comply

## 2015-01-12 NOTE — Telephone Encounter (Signed)
Several Same-Day spots opened tomorrow. Left message for patient to call me back to further triage. Awaiting call back

## 2015-01-13 NOTE — Telephone Encounter (Signed)
Left another message for patient to return call.

## 2015-01-14 NOTE — Telephone Encounter (Signed)
Unable to contact patient. Will see patient on 1/16 with PCP

## 2015-01-17 ENCOUNTER — Encounter: Payer: Self-pay | Admitting: Family Medicine

## 2015-01-17 ENCOUNTER — Ambulatory Visit (INDEPENDENT_AMBULATORY_CARE_PROVIDER_SITE_OTHER): Payer: BLUE CROSS/BLUE SHIELD | Admitting: Family Medicine

## 2015-01-17 VITALS — BP 118/76 | HR 86 | Temp 98.7°F | Ht 71.0 in | Wt 278.1 lb

## 2015-01-17 DIAGNOSIS — R7303 Prediabetes: Secondary | ICD-10-CM

## 2015-01-17 DIAGNOSIS — I10 Essential (primary) hypertension: Secondary | ICD-10-CM

## 2015-01-17 DIAGNOSIS — R51 Headache: Secondary | ICD-10-CM

## 2015-01-17 DIAGNOSIS — G43809 Other migraine, not intractable, without status migrainosus: Secondary | ICD-10-CM

## 2015-01-17 DIAGNOSIS — R519 Headache, unspecified: Secondary | ICD-10-CM

## 2015-01-17 DIAGNOSIS — E669 Obesity, unspecified: Secondary | ICD-10-CM

## 2015-01-17 LAB — CBC WITH DIFFERENTIAL/PLATELET
Basophils Absolute: 0 10*3/uL (ref 0.0–0.1)
Basophils Relative: 0.5 % (ref 0.0–3.0)
EOS PCT: 3.1 % (ref 0.0–5.0)
Eosinophils Absolute: 0.2 10*3/uL (ref 0.0–0.7)
HCT: 41.6 % (ref 36.0–46.0)
HEMOGLOBIN: 13.7 g/dL (ref 12.0–15.0)
Lymphocytes Relative: 36.3 % (ref 12.0–46.0)
Lymphs Abs: 2.4 10*3/uL (ref 0.7–4.0)
MCHC: 32.9 g/dL (ref 30.0–36.0)
MCV: 87.4 fl (ref 78.0–100.0)
Monocytes Absolute: 0.4 10*3/uL (ref 0.1–1.0)
Monocytes Relative: 6.6 % (ref 3.0–12.0)
Neutro Abs: 3.6 10*3/uL (ref 1.4–7.7)
Neutrophils Relative %: 53.5 % (ref 43.0–77.0)
Platelets: 266 10*3/uL (ref 150.0–400.0)
RBC: 4.76 Mil/uL (ref 3.87–5.11)
RDW: 15.2 % (ref 11.5–15.5)
WBC: 6.7 10*3/uL (ref 4.0–10.5)

## 2015-01-17 LAB — BASIC METABOLIC PANEL
BUN: 16 mg/dL (ref 6–23)
CHLORIDE: 107 meq/L (ref 96–112)
CO2: 27 meq/L (ref 19–32)
Calcium: 9.7 mg/dL (ref 8.4–10.5)
Creatinine, Ser: 1.06 mg/dL (ref 0.40–1.20)
GFR: 69.73 mL/min (ref >60.00)
Glucose, Bld: 94 mg/dL (ref 70–99)
Potassium: 4.1 mEq/L (ref 3.5–5.1)
Sodium: 144 mEq/L (ref 135–145)

## 2015-01-17 LAB — HEMOGLOBIN A1C: Hgb A1c MFr Bld: 6.2 % (ref 4.6–6.5)

## 2015-01-17 LAB — TSH: TSH: 2.01 u[IU]/mL (ref 0.35–4.50)

## 2015-01-17 NOTE — Progress Notes (Signed)
Pre visit review using our clinic review tool, if applicable. No additional management support is needed unless otherwise documented below in the visit note. 

## 2015-01-17 NOTE — Patient Instructions (Addendum)
BEFORE YOU LEAVE: -labs -schedule regular follow up in 1 month, if you can come fasting to that visit we will check cholesterol then  -We placed a referral for you as discussed for imaging of the brain regarding you headaches and dizziness. It usually takes about 1-2 weeks to process and schedule this referral. If you have not heard from [place] regarding this appointment in 2 weeks please contact our office.  -[facility] into the [facility] program [facility] and let me know if you want to do this  -Please schedule an appointment with your eye doctor

## 2015-01-17 NOTE — Addendum Note (Signed)
Addended by: Lucretia Kern on: 01/17/2015 02:14 PM   Modules accepted: Orders

## 2015-01-17 NOTE — Progress Notes (Addendum)
HPI:  Jamie [name] is a 55 yo F with a PMH HTN, prediabetes, Migraine, HLD, CERD, Obesity and allergies here for an acute visit for numerous symptoms. She report a two week history of several headaches per week that are different from her prior migraines accompanied by occ mild intermittent dizziness and ? Visual changes though she can not describe this - reports eyes feel heavy. She reports she is going through the change so has hot flashes occ. Denies fevers, NVD, sinus congestion, cough, SOB, rashes, weakness, numbness, speech or hearing issues. However a lot of folks at work sick with viral illnesses. She also has been exercising, but this hurts her knees and hips and she wants and RX for water aerobics. Recent and prior negative cardiac evaluations for atypical CP. She actually underwent a cardiac cath in 07/2014 for a false positive Myovue.  ROS: See pertinent positives and negatives per HPI.  Past Medical History  Diagnosis Date  . Allergy   . Migraine   . Mitral valve prolapse   . Arthritis     in back  . GERD (gastroesophageal reflux disease)   . Hyperlipidemia   . Hypertension   . Muscle cramps     Past Surgical History  Procedure Laterality Date  . Tubal ligation    . Cesarean section    . Back surgery    . Left heart catheterization with coronary angiogram N/A 07/08/2013    Procedure: LEFT HEART CATHETERIZATION WITH CORONARY ANGIOGRAM;  Surgeon: Josue Hector, MD;  Location: [facility] CATH LAB;  Service: Cardiovascular;  Laterality: N/A;    Family History  Problem Relation Age of Onset  . Hyperlipidemia Mother   . Hypertension Mother   . Stroke Father   . Hyperlipidemia Brother     x2  . Hypertension Brother     x2  . Liver cancer Maternal Aunt     Social History   Social History  . Marital Status: Married    Spouse Name: N/A  . Number of Children: 1  . Years of Education: N/A   Occupational History  .     Social History Main Topics  . Smoking status: Former  Smoker    Types: Cigarettes    Quit date: 07/31/1996  . Smokeless tobacco: Never Used  . Alcohol Use: Yes     Comment: very little  . Drug Use: No  . Sexual Activity: Not Asked   Other Topics Concern  . None   Social History Narrative     Current outpatient prescriptions:  .  amLODipine (NORVASC) 10 MG tablet, Take 1 tablet (10 mg total) by mouth daily., Disp: 90 tablet, Rfl: 1 .  hydrochlorothiazide (HYDRODIURIL) 25 MG tablet, TAKE 1 TABLET (25 MG TOTAL) BY MOUTH DAILY., Disp: 90 tablet, Rfl: 0 .  ibuprofen (ADVIL,MOTRIN) 200 MG tablet, Take 600-800 mg by mouth every 8 (eight) hours as needed for mild pain., Disp: , Rfl:  .  losartan (COZAAR) 50 MG tablet, Take 1 tablet (50 mg total) by mouth daily., Disp: 90 tablet, Rfl: 1 .  pravastatin (PRAVACHOL) 40 MG tablet, Take 1 tablet (40 mg total) by mouth daily., Disp: 30 tablet, Rfl: 3 .  ranitidine (ZANTAC) 150 MG tablet, Take 1 tablet (150 mg total) by mouth 2 (two) times daily., Disp: 180 tablet, Rfl: 0  EXAM:  Filed Vitals:   01/17/15 1339  BP: 118/76  Pulse: 86  Temp: 98.7 F (37.1 C)    Body mass index is 38.8 kg/(m^2).  GENERAL: vitals reviewed and listed above, alert, oriented, appears well hydrated and in no acute distress  HEENT: atraumatic, conjunttiva clear, no obvious abnormalities on inspection of external nose and ears, normal appearance of ear canals and TMs, clear nasal congestion, mild post oropharyngeal erythema with PND, no tonsillar edema or exudate, no sinus TTP  NECK: no obvious masses on inspection  LUNGS: clear to auscultation bilaterally, no wheezes, rales or rhonchi, good air movement  CV: HRRR, no peripheral edema  MS: moves all extremities without noticeable abnormality  PSYCH: pleasant and cooperative, no obvious depression or anxiety  NEURO: CN II-XII grossly intact, finger to nose normal, gait normal, speech and thought processing grossly intact  ASSESSMENT AND PLAN:  Discussed the  following assessment and plan:  Other migraine without status migrainosus, not intractable  Frequent headaches - Plan: Basic metabolic panel, TSH, CBC with Differential, MR Brain W Wo Contrast  Prediabetes  Essential hypertension  Obesity  -we discussed possible serious and likely etiologies, workup and treatment, treatment risks and return precautions - query viral illness given many sick contacts vs perimenopause symptoms with change in migraines -after this discussion, Carolle opted for MRI given change in HAs, labs -follow up advised pending results and in 1 month -of course, we advised Santanna  to return or notify a doctor immediately if symptoms worsen or persist or new concerns arise.  -Patient advised to return or notify a doctor immediately if symptoms worsen or persist or new concerns arise.  Patient Instructions  BEFORE YOU LEAVE: -labs -schedule regular follow up in 1 month, if you can come fasting to that visit we will check cholesterol then  -We placed a referral for you as discussed for imaging of the brain regarding you headaches and dizziness. It usually takes about 1-2 weeks to process and schedule this referral. If you have not heard from [place] regarding this appointment in 2 weeks please contact our office.  -[facility] the [facility] and let me know if you want to do this  -Please schedule an appointment with your eye doctor     Lucretia Kern.

## 2015-01-30 ENCOUNTER — Other Ambulatory Visit: Payer: Self-pay | Admitting: Family Medicine

## 2015-02-15 ENCOUNTER — Ambulatory Visit: Payer: BLUE CROSS/BLUE SHIELD | Admitting: Family Medicine

## 2015-04-10 ENCOUNTER — Other Ambulatory Visit: Payer: Self-pay | Admitting: Family Medicine

## 2015-05-06 ENCOUNTER — Ambulatory Visit (INDEPENDENT_AMBULATORY_CARE_PROVIDER_SITE_OTHER): Payer: BLUE CROSS/BLUE SHIELD | Admitting: Family Medicine

## 2015-05-06 ENCOUNTER — Encounter: Payer: Self-pay | Admitting: Family Medicine

## 2015-05-06 VITALS — BP 128/82 | HR 81 | Temp 98.3°F | Ht 71.0 in | Wt 287.7 lb

## 2015-05-06 DIAGNOSIS — E785 Hyperlipidemia, unspecified: Secondary | ICD-10-CM | POA: Diagnosis not present

## 2015-05-06 DIAGNOSIS — R252 Cramp and spasm: Secondary | ICD-10-CM

## 2015-05-06 DIAGNOSIS — E669 Obesity, unspecified: Secondary | ICD-10-CM

## 2015-05-06 DIAGNOSIS — G8929 Other chronic pain: Secondary | ICD-10-CM

## 2015-05-06 DIAGNOSIS — I10 Essential (primary) hypertension: Secondary | ICD-10-CM | POA: Diagnosis not present

## 2015-05-06 DIAGNOSIS — K219 Gastro-esophageal reflux disease without esophagitis: Secondary | ICD-10-CM

## 2015-05-06 LAB — MAGNESIUM: MAGNESIUM: 2 mg/dL (ref 1.5–2.5)

## 2015-05-06 LAB — BASIC METABOLIC PANEL
BUN: 12 mg/dL (ref 6–23)
CO2: 29 mEq/L (ref 19–32)
Calcium: 9.6 mg/dL (ref 8.4–10.5)
Chloride: 106 mEq/L (ref 96–112)
Creatinine, Ser: 0.83 mg/dL (ref 0.40–1.20)
GFR: 92.36 mL/min (ref >60.00)
GLUCOSE: 102 mg/dL — AB (ref 70–99)
Potassium: 3.9 mEq/L (ref 3.5–5.1)
SODIUM: 143 meq/L (ref 135–145)

## 2015-05-06 NOTE — Patient Instructions (Addendum)
BEFORE YOU LEAVE: -schedule physical exam in September or October; Fasting if possible, if not that is ok  For chronic pain: -please follow up with your specialist - I advised using combination exercise, healthy clean diet, heat, topical sports creams, tylenol or aleve, yoga, medication, etc to address chronic pain. -I advise against the use of opoid or narcotic pain medications for chronic musculoskeletal pain as they can actually lead to worsening pain over time and are very high risk medications in terms of side effects  We recommend the following healthy lifestyle measures: - eat a healthy whole foods diet consisting of regular small meals composed of vegetables, fruits, beans, nuts, seeds, healthy meats such as white chicken and fish and whole grains.  - avoid sweets, white starchy foods, fried foods, fast food, processed foods, sodas, red meet and other fattening foods.  - get a least 150-300 minutes of aerobic exercise per week.   -We have ordered labs or studies at this visit. It can take up to 1-2 weeks for results and processing. We will contact you with instructions IF your results are abnormal. Normal results will be released to your [facility]. If you have not heard from [place] or can not find your results in [facility] in 2 weeks please contact our office.

## 2015-05-06 NOTE — Progress Notes (Signed)
HPI:  Follow up:  HTN: -meds:amlodipine 10, hctz 25, losartan 50 -she does have leg cramps, reports her whole life - more at night -she tried decreasing her diuretic and did not help - so now wants to increase as has mild swelling with lower dose -denies: CP, SOB, DOE, swelling  GERD: -meds: now using ranitidine  -she had an EGD in 2014 -denies: dysphagia, SOB, Wheezing, vomiting, weight loss  Obesity/HLD/Hyperglycemia: -no regular exercise - doing water therapy prescribed by her back specialist -diet ok -on statin  Chronic low back pain, hip pain, occ radicular symptoms: -seeing Dr. Rolena Infante, doing water therapy -[facility], but wonders if should start oxycodone - her husband takes this for pain   ROS: See pertinent positives and negatives per HPI.  Past Medical History  Diagnosis Date  . Allergy   . Migraine   . Mitral valve prolapse   . Arthritis     in back  . GERD (gastroesophageal reflux disease)   . Hyperlipidemia   . Hypertension   . Muscle cramps   . Chest pain     neg cath 2016 after false positive myoview    Past Surgical History  Procedure Laterality Date  . Tubal ligation    . Cesarean section    . Back surgery    . Left heart catheterization with coronary angiogram N/A 07/08/2013    Procedure: LEFT HEART CATHETERIZATION WITH CORONARY ANGIOGRAM;  Surgeon: Josue Hector, MD;  Location: [facility] CATH LAB;  Service: Cardiovascular;  Laterality: N/A;    Family History  Problem Relation Age of Onset  . Hyperlipidemia Mother   . Hypertension Mother   . Stroke Father   . Hyperlipidemia Brother     x2  . Hypertension Brother     x2  . Liver cancer Maternal Aunt    liflif Social History   Social History  . Marital Status: Married    Spouse Name: N/A  . Number of Children: 1  . Years of Education: N/A   Occupational History  .     Social History Main Topics  . Smoking status: Former Smoker     Types: Cigarettes    Quit date: 07/31/1996  . Smokeless tobacco: Never Used  . Alcohol Use: Yes     Comment: very little  . Drug Use: No  . Sexual Activity: Not Asked   Other Topics Concern  . None   Social History Narrative     Current outpatient prescriptions:  .  amLODipine (NORVASC) 10 MG tablet, Take 1 tablet (10 mg total) by mouth daily., Disp: 90 tablet, Rfl: 1 .  hydrochlorothiazide (HYDRODIURIL) 25 MG tablet, TAKE 1 TABLET (25 MG TOTAL) BY MOUTH DAILY., Disp: 90 tablet, Rfl: 0 .  ibuprofen (ADVIL,MOTRIN) 200 MG tablet, Take 600-800 mg by mouth every 8 (eight) hours as needed for mild pain., Disp: , Rfl:  .  losartan (COZAAR) 50 MG tablet, Take 1 tablet (50 mg total) by mouth daily., Disp: 90 tablet, Rfl: 1 .  pravastatin (PRAVACHOL) 40 MG tablet, TAKE 1 TABLET (40 MG TOTAL) BY MOUTH DAILY., Disp: 30 tablet, Rfl: 2  EXAM:  Filed Vitals:   05/06/15 0757  BP: 128/82  Pulse: 81  Temp: 98.3 F (36.8 C)    Body mass index is 40.14 kg/(m^2).  GENERAL: vitals reviewed and listed above, alert, oriented, appears well hydrated and in no acute distress  HEENT: atraumatic, conjunttiva clear, no obvious abnormalities on  inspection of external nose and ears  NECK: no obvious masses on inspection  LUNGS: clear to auscultation bilaterally, no wheezes, rales or rhonchi, good air movement  CV: HRRR, no peripheral edema  MS: moves all extremities without noticeable abnormality  PSYCH: pleasant and cooperative, no obvious depression or anxiety  ASSESSMENT AND PLAN:  Discussed the following assessment and plan:  Hyperlipemia  Cramp of both lower extremities - Plan: Basic metabolic panel, Magnesium  Essential hypertension  Obesity  Gastroesophageal reflux disease, esophagitis presence not specified  Chronic pain - low back, hipsseeing Dr. Rolena Infante (2017)  -lifestyle recs -will get bmp and mag due to leg cramps, but these are chronic, trial tonic water at  bed -discussed complex issues of chronic musculoskeletal pain and treatment. -supported and counseled. Advised I do not recommend or prescribe opiods for chronic pain and advised of risks and limitations. Advised follow up with her treating specialist and comprehensive approach with healthy diet, regular exercise, safer analgesics, topical products, heat, meditation, acupuncture, weight reduction, etc -ok to go back to regular dose of diuretic -CPE 3-4 months  -Patient advised to return or notify a doctor immediately if symptoms worsen or persist or new concerns arise.  Patient Instructions  BEFORE YOU LEAVE: -schedule physical exam in September or October; Fasting if possible, if not that is ok  For chronic pain: -please follow up with your specialist - I advised using combination exercise, healthy clean diet, heat, topical sports creams, tylenol or aleve, yoga, medication, etc to address chronic pain. -I advise against the use of opoid or narcotic pain medications for chronic musculoskeletal pain as they can actually lead to worsening pain over time and are very high risk medications in terms of side effects  We recommend the following healthy lifestyle measures: - eat a healthy whole foods diet consisting of regular small meals composed of vegetables, fruits, beans, nuts, seeds, healthy meats such as white chicken and fish and whole grains.  - avoid sweets, white starchy foods, fried foods, fast food, processed foods, sodas, red meet and other fattening foods.  - get a least 150-300 minutes of aerobic exercise per week.   -We have ordered labs or studies at this visit. It can take up to 1-2 weeks for results and processing. We will contact you with instructions IF your results are abnormal. Normal results will be released to your [facility]. If you have not heard from [place] or can not find your results in [facility] in 2 weeks please contact our office.             Colin Benton R.

## 2015-05-06 NOTE — Progress Notes (Signed)
Pre visit review using our clinic review tool, if applicable. No additional management support is needed unless otherwise documented below in the visit note. 

## 2015-06-11 ENCOUNTER — Other Ambulatory Visit: Payer: Self-pay | Admitting: Family Medicine

## 2015-09-22 ENCOUNTER — Encounter: Payer: BLUE CROSS/BLUE SHIELD | Admitting: Family Medicine

## 2015-09-27 ENCOUNTER — Telehealth: Payer: Self-pay | Admitting: Family Medicine

## 2015-09-27 NOTE — Telephone Encounter (Signed)
Advise appt if feeling very sick. If feels is a cold can try short course afrin nasal spray (don't use longer then 4 days) and ibuprofen or tylenol per instructions. Appt if not feeling better or concerns or worsening.

## 2015-09-27 NOTE — Telephone Encounter (Signed)
Pt has flu like symtoms body aches, running nose, no fever or chills and has been taking OTC Coricidin HP (Night) and would like to know if there is anything else OTC that is stronger she can take.

## 2015-09-27 NOTE — Telephone Encounter (Signed)
I called the pt and informed her of the message below, offered an appt for today and she stated she will continue OTC meds and call back for an appt if needed.

## 2015-10-06 ENCOUNTER — Other Ambulatory Visit: Payer: Self-pay | Admitting: Obstetrics and Gynecology

## 2015-10-06 DIAGNOSIS — Z1231 Encounter for screening mammogram for malignant neoplasm of breast: Secondary | ICD-10-CM

## 2015-10-11 ENCOUNTER — Ambulatory Visit (INDEPENDENT_AMBULATORY_CARE_PROVIDER_SITE_OTHER): Payer: BLUE CROSS/BLUE SHIELD | Admitting: Family Medicine

## 2015-10-11 ENCOUNTER — Encounter: Payer: Self-pay | Admitting: Family Medicine

## 2015-10-11 VITALS — BP 118/82 | HR 78 | Temp 98.8°F | Ht 70.0 in | Wt 285.6 lb

## 2015-10-11 DIAGNOSIS — R739 Hyperglycemia, unspecified: Secondary | ICD-10-CM | POA: Diagnosis not present

## 2015-10-11 DIAGNOSIS — E785 Hyperlipidemia, unspecified: Secondary | ICD-10-CM

## 2015-10-11 DIAGNOSIS — I10 Essential (primary) hypertension: Secondary | ICD-10-CM

## 2015-10-11 DIAGNOSIS — Z0000 Encounter for general adult medical examination without abnormal findings: Secondary | ICD-10-CM | POA: Diagnosis not present

## 2015-10-11 DIAGNOSIS — Z23 Encounter for immunization: Secondary | ICD-10-CM | POA: Diagnosis not present

## 2015-10-11 LAB — CBC
HEMATOCRIT: 37.3 % (ref 36.0–46.0)
Hemoglobin: 12.6 g/dL (ref 12.0–15.0)
MCHC: 33.7 g/dL (ref 30.0–36.0)
MCV: 86.8 fl (ref 78.0–100.0)
Platelets: 271 10*3/uL (ref 150.0–400.0)
RBC: 4.3 Mil/uL (ref 3.87–5.11)
RDW: 14.9 % (ref 11.5–15.5)
WBC: 5.3 10*3/uL (ref 4.0–10.5)

## 2015-10-11 LAB — BASIC METABOLIC PANEL
BUN: 8 mg/dL (ref 6–23)
CHLORIDE: 105 meq/L (ref 96–112)
CO2: 30 mEq/L (ref 19–32)
CREATININE: 0.8 mg/dL (ref 0.40–1.20)
Calcium: 8.9 mg/dL (ref 8.4–10.5)
GFR: 96.21 mL/min (ref >60.00)
Glucose, Bld: 93 mg/dL (ref 70–99)
Potassium: 3.8 mEq/L (ref 3.5–5.1)
Sodium: 140 mEq/L (ref 135–145)

## 2015-10-11 LAB — LIPID PANEL
CHOL/HDL RATIO: 3
Cholesterol: 163 mg/dL (ref 0–200)
HDL: 49.1 mg/dL (ref >39.00)
LDL CALC: 97 mg/dL (ref 0–99)
NonHDL: 114.15
TRIGLYCERIDES: 88 mg/dL (ref 0.0–149.0)
VLDL: 17.6 mg/dL (ref 0.0–40.0)

## 2015-10-11 LAB — HEMOGLOBIN A1C: Hgb A1c MFr Bld: 6 % (ref 4.6–6.5)

## 2015-10-11 NOTE — Progress Notes (Signed)
HPI:  Here for CPE:  -Concerns and/or follow up today:   HTN/LE edema - chronic: -meds:amlodipine 10, hctz 12.5, losartan 50 -she does have leg cramps, reports her whole life - more at night - better since lowering diuretic -denies: CP, SOB, DOE, increased swelling  GERD: -meds: ranitidine prn -she had an EGD in 2014 -denies: dysphagia, SOB, Wheezing, vomiting, weight loss  Obesity/HLD/Hyperglycemia: -has started exercising 3 days per week -is trying to improve diet - we reviewed her typical diet (quick sweetened oats, sweetened granola, doing better with lean meats and increasing veggies, like corn, simple starches) -on statin  Chronic low back pain, hip pain, occ radicular symptoms: -seeing Dr. Rolena Infante  -Diet: variety of foods, balance and well rounded, larger portion sizes  -Exercise: no regular exercise  -Vaccines: UTD - getting flu shot today  -pap history: sees gyn for women's exams  -sexual activity: yes, female partner, no new partners  -wants STI testing (Hep C if born [birth year]): no  -FH breast, colon or ovarian ca: see FH Last mammogram: sees gyn and reports is scheduled for later this month for mammo Last colon cancer screening: UTD  Breast Ca Risk Assessment: -sees gyn for women's health   -Alcohol, Tobacco, drug use: see social history  Review of Systems - no fevers, unintentional weight loss, vision loss, hearing loss, chest pain, sob, hemoptysis, melena, hematochezia, hematuria, genital discharge, changing or concerning skin lesions, bleeding, bruising, loc, thoughts of self harm or SI  Past Medical History:  Diagnosis Date  . Allergy   . Arthritis    in back  . Chest pain    neg cath 2016 after false positive myoview  . GERD (gastroesophageal reflux disease)   . Hyperlipidemia   . Hypertension   . Migraine   . Mitral valve prolapse   . Muscle cramps     Past Surgical History:  Procedure Laterality Date  . BACK SURGERY    . CESAREAN  SECTION    . LEFT HEART CATHETERIZATION WITH CORONARY ANGIOGRAM N/A 07/08/2013   Procedure: LEFT HEART CATHETERIZATION WITH CORONARY ANGIOGRAM;  Surgeon: Josue Hector, MD;  Location: [facility] CATH LAB;  Service: Cardiovascular;  Laterality: N/A;  . TUBAL LIGATION      Family History  Problem Relation Age of Onset  . Hyperlipidemia Mother   . Hypertension Mother   . Stroke Father   . Hyperlipidemia Brother     x2  . Hypertension Brother     x2  . Liver cancer Maternal Aunt     Social History   Social History  . Marital status: Married    Spouse name: N/A  . Number of children: 1  . Years of education: N/A   Occupational History  .  [employer]   Social History Main Topics  . Smoking status: Former Smoker    Types: Cigarettes    Quit date: 07/31/1996  . Smokeless tobacco: Never Used  . Alcohol use Yes     Comment: very little  . Drug use: No  . Sexual activity: Not Asked   Other Topics Concern  . None   Social History Narrative  . None     Current Outpatient Prescriptions:  .  amLODipine (NORVASC) 10 MG tablet, TAKE 1 TABLET (10 MG TOTAL) BY MOUTH DAILY., Disp: 90 tablet, Rfl: 1 .  hydrochlorothiazide (HYDRODIURIL) 25 MG tablet, TAKE 1 TABLET (25 MG TOTAL) BY MOUTH DAILY., Disp: 90 tablet, Rfl: 0 .  ibuprofen (ADVIL,MOTRIN) 200 MG tablet, Take  600-800 mg by mouth every 8 (eight) hours as needed for mild pain., Disp: , Rfl:  .  losartan (COZAAR) 50 MG tablet, TAKE 1 TABLET (50 MG TOTAL) BY MOUTH DAILY., Disp: 90 tablet, Rfl: 1 .  pravastatin (PRAVACHOL) 40 MG tablet, TAKE 1 TABLET (40 MG TOTAL) BY MOUTH DAILY., Disp: 30 tablet, Rfl: 2  EXAM:  Vitals:   10/11/15 0852  BP: 118/82  Pulse: 78  Temp: 98.8 F (37.1 C)   Body mass index is 40.98 kg/m.  GENERAL: vitals reviewed and listed below, alert, oriented, appears well hydrated and in no acute distress  HEENT: head atraumatic, PERRLA, normal appearance of eyes, ears, nose and mouth. moist mucus  membranes.  NECK: supple, no masses or lymphadenopathy  LUNGS: clear to auscultation bilaterally, no rales, rhonchi or wheeze  CV: HRRR, no peripheral edema or cyanosis, normal pedal pulses  BREAST: Declined, does with GYN  ABDOMEN: bowel sounds normal, soft, non tender to palpation, no masses, no rebound or guarding  GU: Declined, does with GYN  SKIN: no rash or abnormal lesions  MS: normal gait, moves all extremities normally  NEURO: normal gait, speech and thought processing grossly intact, muscle tone grossly intact throughout  PSYCH: normal affect, pleasant and cooperative  ASSESSMENT AND PLAN:  Discussed the following assessment and plan:  Encounter for preventive health examination  Hyperglycemia - Plan: Hemoglobin A1c  Hyperlipidemia, unspecified hyperlipidemia type - Plan: Lipid Panel  Essential hypertension - Plan: Basic metabolic panel, CBC (no diff)  BMI >= 40 (HCC)  Encounter for immunization - Plan: Flu Vaccine QUAD 36+ mos IM  -spent a long time reviewing her typical diet and she consumes quite a bit of sugar/simple starches. We discussed alternatives for each of these. I really do think if she could stick to lifelong lifestyle changes including a healthy very low sugar, whole foods diet and regular aerobic exercise that she would see a reduction in weight and sig health benefits. This would be the healthiest way to achieve wt reduction. Other options such as medications and surgeries are also valid treatment, but results likely are not longstanding without the lifestyle changes and these carry greater risks. She plans to work on diet an exercise. She may consider weight watchers to help.  -Discussed and advised all [place] preventive services health task force level A and B recommendations for age, sex and risks.  -FASTING labs, studies and vaccines per orders this encounter  -fu shot  Orders Placed This Encounter  Procedures  . Flu Vaccine QUAD 36+ mos IM   . Hemoglobin A1c  . Lipid Panel  . Basic metabolic panel  . CBC (no diff)    Patient advised to return to clinic immediately if symptoms worsen or persist or new concerns.  Patient Instructions  BEFORE YOU LEAVE: -follow up: 4 months -flu shot -labs  We have ordered labs or studies at this visit. It can take up to 1-2 weeks for results and processing. IF results require follow up or explanation, we will call you with instructions. Clinically stable results will be released to your [facility]. If you have not heard from [place] or cannot find your results in [facility] in 2 weeks please contact our office at [phone].  If you are not yet signed up for [facility], please consider signing up.   We recommend the following healthy lifestyle for LIFE: 1) Small portions.   Tip: eat off of a salad plate instead of a dinner plate.  Tip: It is ok  to feel hungry after a meal - that likely means you ate an appropriate portion.  Tip: if you need more or a snack choose fruits, veggies and/or a handful of nuts or seeds.  2) Eat a healthy clean diet.  * Tip: Avoid (less then 1 serving per week): processed foods, sweets, sweetened drinks, white starches (rice, flour, bread, potatoes, pasta, etc), red meat, fast foods, butter  *Tip: CHOOSE instead   * 5-9 servings per day of fresh or frozen fruits and vegetables (but not corn, potatoes, bananas, canned or dried fruit)   *nuts and seeds, beans   *olives and olive oil   *small portions of lean meats such as fish and white chicken    *small portions of whole grains  3)Get at least 150 minutes of sweaty aerobic exercise per week.  4)Reduce stress - consider counseling, meditation and relaxation to balance other aspects of your life.          No Follow-up on file.  Colin Benton R., DO

## 2015-10-11 NOTE — Progress Notes (Signed)
Pre visit review using our clinic review tool, if applicable. No additional management support is needed unless otherwise documented below in the visit note. 

## 2015-10-11 NOTE — Patient Instructions (Signed)
BEFORE YOU LEAVE: -follow up: 4 months -flu shot -labs  We have ordered labs or studies at this visit. It can take up to 1-2 weeks for results and processing. IF results require follow up or explanation, we will call you with instructions. Clinically stable results will be released to your [facility]. If you have not heard from [place] or cannot find your results in [facility] in 2 weeks please contact our office at [phone].  If you are not yet signed up for [facility], please consider signing up.   We recommend the following healthy lifestyle for LIFE: 1) Small portions.   Tip: eat off of a salad plate instead of a dinner plate.  Tip: It is ok to feel hungry after a meal - that likely means you ate an appropriate portion.  Tip: if you need more or a snack choose fruits, veggies and/or a handful of nuts or seeds.  2) Eat a healthy clean diet.  * Tip: Avoid (less then 1 serving per week): processed foods, sweets, sweetened drinks, white starches (rice, flour, bread, potatoes, pasta, etc), red meat, fast foods, butter  *Tip: CHOOSE instead   * 5-9 servings per day of fresh or frozen fruits and vegetables (but not corn, potatoes, bananas, canned or dried fruit)   *nuts and seeds, beans   *olives and olive oil   *small portions of lean meats such as fish and white chicken    *small portions of whole grains  3)Get at least 150 minutes of sweaty aerobic exercise per week.  4)Reduce stress - consider counseling, meditation and relaxation to balance other aspects of your life.

## 2015-10-13 ENCOUNTER — Telehealth: Payer: Self-pay | Admitting: Family Medicine

## 2015-10-13 NOTE — Telephone Encounter (Signed)
Jamie [name] pt returning your call. °

## 2015-10-28 ENCOUNTER — Ambulatory Visit
Admission: RE | Admit: 2015-10-28 | Discharge: 2015-10-28 | Disposition: A | Discharge status: Home / Self Care | Payer: BLUE CROSS/BLUE SHIELD | Source: Ambulatory Visit | Attending: Obstetrics and Gynecology | Admitting: Obstetrics and Gynecology

## 2015-10-28 DIAGNOSIS — Z1231 Encounter for screening mammogram for malignant neoplasm of breast: Secondary | ICD-10-CM

## 2015-10-31 ENCOUNTER — Encounter: Payer: Self-pay | Admitting: Family Medicine

## 2015-10-31 ENCOUNTER — Ambulatory Visit (INDEPENDENT_AMBULATORY_CARE_PROVIDER_SITE_OTHER): Payer: BLUE CROSS/BLUE SHIELD | Admitting: Family Medicine

## 2015-10-31 VITALS — BP 122/82 | HR 77 | Temp 98.5°F | Ht 70.0 in | Wt 285.6 lb

## 2015-10-31 DIAGNOSIS — J069 Acute upper respiratory infection, unspecified: Secondary | ICD-10-CM

## 2015-10-31 DIAGNOSIS — B9789 Other viral agents as the cause of diseases classified elsewhere: Secondary | ICD-10-CM

## 2015-10-31 MED ORDER — BENZONATATE 100 MG PO CAPS: 100.0000 mg | ORAL_CAPSULE | Freq: Two times a day (BID) | ORAL | 0 refills | Status: DC | PRN

## 2015-10-31 MED ORDER — PREDNISONE 20 MG PO TABS: 40.0000 mg | ORAL_TABLET | Freq: Every day | ORAL | 0 refills | Status: DC

## 2015-10-31 NOTE — Progress Notes (Signed)
HPI:  URI: -started: 9 days ago -symptoms:nasal congestion, sore throat, cough, occ wheeze, feels like cough is worse - but no thick mucus production -denies:fever, SOB, NVD, tooth pain -has tried:  nothing -sick contacts/travel/risks: no reported flu, strep or tick exposure - mother with the same  ROS: See pertinent positives and negatives per HPI.  Past Medical History:  Diagnosis Date  . Allergy   . Arthritis    in back  . Chest pain    neg cath 2016 after false positive myoview  . GERD (gastroesophageal reflux disease)   . Hyperlipidemia   . Hypertension   . Migraine   . Mitral valve prolapse   . Muscle cramps     Past Surgical History:  Procedure Laterality Date  . BACK SURGERY    . CESAREAN SECTION    . LEFT HEART CATHETERIZATION WITH CORONARY ANGIOGRAM N/A 07/08/2013   Procedure: LEFT HEART CATHETERIZATION WITH CORONARY ANGIOGRAM;  Surgeon: Josue Hector, MD;  Location: [facility] CATH LAB;  Service: Cardiovascular;  Laterality: N/A;  . TUBAL LIGATION      Family History  Problem Relation Age of Onset  . Hyperlipidemia Mother   . Hypertension Mother   . Stroke Father   . Hyperlipidemia Brother     x2  . Hypertension Brother     x2  . Liver cancer Maternal Aunt     Social History   Social History  . Marital status: Married    Spouse name: N/A  . Number of children: 1  . Years of education: N/A   Occupational History  .  [employer]   Social History Main Topics  . Smoking status: Former Smoker    Types: Cigarettes    Quit date: 07/31/1996  . Smokeless tobacco: Never Used  . Alcohol use Yes     Comment: very little  . Drug use: No  . Sexual activity: Not Asked   Other Topics Concern  . None   Social History Narrative  . None     Current Outpatient Prescriptions:  .  amLODipine (NORVASC) 10 MG tablet, TAKE 1 TABLET (10 MG TOTAL) BY MOUTH DAILY., Disp: 90 tablet, Rfl: 1 .  hydrochlorothiazide (HYDRODIURIL) 25 MG tablet, TAKE 1 TABLET  (25 MG TOTAL) BY MOUTH DAILY., Disp: 90 tablet, Rfl: 0 .  ibuprofen (ADVIL,MOTRIN) 200 MG tablet, Take 600-800 mg by mouth every 8 (eight) hours as needed for mild pain., Disp: , Rfl:  .  losartan (COZAAR) 50 MG tablet, TAKE 1 TABLET (50 MG TOTAL) BY MOUTH DAILY., Disp: 90 tablet, Rfl: 1 .  pravastatin (PRAVACHOL) 40 MG tablet, TAKE 1 TABLET (40 MG TOTAL) BY MOUTH DAILY., Disp: 30 tablet, Rfl: 2 .  benzonatate (TESSALON) 100 MG capsule, Take 1 capsule (100 mg total) by mouth 2 (two) times daily as needed for cough., Disp: 20 capsule, Rfl: 0 .  predniSONE (DELTASONE) 20 MG tablet, Take 2 tablets (40 mg total) by mouth daily with breakfast., Disp: 8 tablet, Rfl: 0  EXAM:  Vitals:   10/31/15 1409  BP: 122/82  Pulse: 77  Temp: 98.5 F (36.9 C)    Body mass index is 40.98 kg/m.  GENERAL: vitals reviewed and listed above, alert, oriented, appears well hydrated and in no acute distress  HEENT: atraumatic, conjunttiva clear, no obvious abnormalities on inspection of external nose and ears, normal appearance of ear canals and TMs, clear nasal congestion, mild post oropharyngeal erythema with PND, no tonsillar edema or exudate, no sinus TTP  NECK: no obvious masses on inspection  LUNGS: clear to auscultation bilaterally, no wheezes, rales or rhonchi, good air movement  CV: HRRR, no peripheral edema  MS: moves all extremities without noticeable abnormality  PSYCH: pleasant and cooperative, no obvious depression or anxiety  ASSESSMENT AND PLAN:  Discussed the following assessment and plan:  Viral upper respiratory tract infection  -given HPI and exam findings today, a serious infection or illness is unlikely. We discussed potential etiologies, with VURI with possible bronchitis being most likely. We discussed treatment side effects, likely course, antibiotic misuse, transmission, and signs of developing a serious illness.Opted to try short course nasal decongestant and tessalon.  Prednisone if persists. -of course, we advised to return or notify a doctor immediately if symptoms worsen or persist or new concerns arise.    Patient Instructions  INSTRUCTIONS FOR UPPER RESPIRATORY INFECTION:  -Start with AFRIN nasal spray for 4 days (do not use longer) and tessalon. If this is not helping the cough can take the prednisone.  -nasal saline wash 2-3 times daily (use prepackaged nasal saline or bottled/distilled water if making your own)   -can use tylenol (in no history of liver disease) or ibuprofen (if no history of kidney disease, bowel bleeding or significant heart disease) as directed for aches and sorethroat  -in the winter time, using a humidifier at night is helpful (please follow cleaning instructions)  -if you are taking a cough medication - use only as directed, may also try a teaspoon of honey to coat the throat and throat lozenges. If given a cough medication with codeine or hydrocodone or other narcotic please be advised that this contains a strong and  potentially addicting medication. Please follow instructions carefully, take as little as possible and only use AS NEEDED for severe cough. Discuss potential side effects with your pharmacy. Please do not drive or operate machinery while taking these types of medications. Please do not take other sedating medications, drugs or alcohol while taking this medication without discussing with your doctor.  -for sore throat, salt water gargles can help  -follow up if you have fevers, facial pain, tooth pain, difficulty breathing or are worsening or symptoms persist longer then expected  Upper Respiratory Infection, Adult An upper respiratory infection (URI) is also known as the common cold. It is often caused by a type of germ (virus). Colds are easily spread (contagious). You can pass it to others by kissing, coughing, sneezing, or drinking out of the same glass. Usually, you get better in 1 to 3  weeks.  However, the  cough can last for even longer. HOME CARE   Only take medicine as told by your doctor. Follow instructions provided above.  Drink enough water and fluids to keep your pee (urine) clear or pale yellow.  Get plenty of rest.  Return to work when your temperature is < 100 for 24 hours or as told by your doctor. You may use a face mask and wash your hands to stop your cold from spreading. GET HELP RIGHT AWAY IF:   After the first few days, you feel you are getting worse.  You have questions about your medicine.  You have chills, shortness of breath, or red spit (mucus).  You have pain in the face for more then 1-2 days, especially when you bend forward.  You have a fever, puffy (swollen) neck, pain when you swallow, or white spots in the back of your throat.  You have a bad headache, ear pain, sinus  pain, or chest pain.  You have a high-pitched whistling sound when you breathe in and out (wheezing).  You cough up blood.  You have sore muscles or a stiff neck. MAKE SURE YOU:   Understand these instructions.  Will watch your condition.  Will get help right away if you are not doing well or get worse. Document Released: 06/06/2007 Document Revised: 03/12/2011 Document Reviewed: 03/25/2013 [facility] Patient Information 2015 [place], [place]. This information is not intended to replace advice given to you by your health care provider. Make sure you discuss any questions you have with your health care provider.    Colin Benton R., DO

## 2015-10-31 NOTE — Progress Notes (Signed)
Pre visit review using our clinic review tool, if applicable. No additional management support is needed unless otherwise documented below in the visit note. 

## 2015-10-31 NOTE — Patient Instructions (Signed)
INSTRUCTIONS FOR UPPER RESPIRATORY INFECTION:  -Start with AFRIN nasal spray for 4 days (do not use longer) and tessalon. If this is not helping the cough can take the prednisone.  -nasal saline wash 2-3 times daily (use prepackaged nasal saline or bottled/distilled water if making your own)   -can use tylenol (in no history of liver disease) or ibuprofen (if no history of kidney disease, bowel bleeding or significant heart disease) as directed for aches and sorethroat  -in the winter time, using a humidifier at night is helpful (please follow cleaning instructions)  -if you are taking a cough medication - use only as directed, may also try a teaspoon of honey to coat the throat and throat lozenges. If given a cough medication with codeine or hydrocodone or other narcotic please be advised that this contains a strong and  potentially addicting medication. Please follow instructions carefully, take as little as possible and only use AS NEEDED for severe cough. Discuss potential side effects with your pharmacy. Please do not drive or operate machinery while taking these types of medications. Please do not take other sedating medications, drugs or alcohol while taking this medication without discussing with your doctor.  -for sore throat, salt water gargles can help  -follow up if you have fevers, facial pain, tooth pain, difficulty breathing or are worsening or symptoms persist longer then expected  Upper Respiratory Infection, Adult An upper respiratory infection (URI) is also known as the common cold. It is often caused by a type of germ (virus). Colds are easily spread (contagious). You can pass it to others by kissing, coughing, sneezing, or drinking out of the same glass. Usually, you get better in 1 to 3  weeks.  However, the cough can last for even longer. HOME CARE   Only take medicine as told by your doctor. Follow instructions provided above.  Drink enough water and fluids to keep your  pee (urine) clear or pale yellow.  Get plenty of rest.  Return to work when your temperature is < 100 for 24 hours or as told by your doctor. You may use a face mask and wash your hands to stop your cold from spreading. GET HELP RIGHT AWAY IF:   After the first few days, you feel you are getting worse.  You have questions about your medicine.  You have chills, shortness of breath, or red spit (mucus).  You have pain in the face for more then 1-2 days, especially when you bend forward.  You have a fever, puffy (swollen) neck, pain when you swallow, or white spots in the back of your throat.  You have a bad headache, ear pain, sinus pain, or chest pain.  You have a high-pitched whistling sound when you breathe in and out (wheezing).  You cough up blood.  You have sore muscles or a stiff neck. MAKE SURE YOU:   Understand these instructions.  Will watch your condition.  Will get help right away if you are not doing well or get worse. Document Released: 06/06/2007 Document Revised: 03/12/2011 Document Reviewed: 03/25/2013 [facility] Patient Information 2015 [place], [place]. This information is not intended to replace advice given to you by your health care provider. Make sure you discuss any questions you have with your health care provider.

## 2016-01-21 ENCOUNTER — Other Ambulatory Visit: Payer: Self-pay | Admitting: Family Medicine

## 2016-01-28 ENCOUNTER — Other Ambulatory Visit: Payer: Self-pay | Admitting: Family Medicine

## 2016-02-05 NOTE — Progress Notes (Signed)
HPI:  Jamie [name] is a pleasant 56 yo with a PMH significant for HTN, LE edema, GERD, Obesity, Hyperglycemia, Hyperlipidemia and chronic low back pain here for follow up. Reports doing well except for her chronic back pain. Sees specialist for this. Does not like the way various medications make her feel but wants something for bad days - get spasm in low back . Exercising at [employer] 3 days per week but no core exercises. Not doing HEP provided. Take some ibuprofen. Wants to stop blood pressure meds if possible. Diet so so. Denies CP, SOB, DOE, weakness, numbness. Due for BP labs   HTN/LE edema - chronic: -meds:amlodipine 10, hctz 12.5, losartan 50 -she does have leg cramps, reports her whole life - more at night - better since lowering diuretic  GERD: -meds: ranitidine prn -she had an EGD in 2014  Obesity/HLD/Hyperglycemia: -on statin  Chronic low back pain, hip pain, occ radicular symptoms: -seeing Dr. Rolena Infante  ROS: See pertinent positives and negatives per HPI.  Past Medical History:  Diagnosis Date  . Allergy   . Arthritis    in back  . Chest pain    neg cath 2016 after false positive myoview  . GERD (gastroesophageal reflux disease)   . Hyperlipidemia   . Hypertension   . Migraine   . Mitral valve prolapse   . Muscle cramps     Past Surgical History:  Procedure Laterality Date  . BACK SURGERY    . CESAREAN SECTION    . LEFT HEART CATHETERIZATION WITH CORONARY ANGIOGRAM N/A 07/08/2013   Procedure: LEFT HEART CATHETERIZATION WITH CORONARY ANGIOGRAM;  Surgeon: Josue Hector, MD;  Location: [facility] CATH LAB;  Service: Cardiovascular;  Laterality: N/A;  . TUBAL LIGATION      Family History  Problem Relation Age of Onset  . Hyperlipidemia Mother   . Hypertension Mother   . Stroke Father   . Hyperlipidemia Brother     x2  . Hypertension Brother     x2  . Liver cancer Maternal Aunt     Social History   Social History  . Marital status: Married    Spouse name: N/A   . Number of children: 1  . Years of education: N/A   Occupational History  .  [employer]   Social History Main Topics  . Smoking status: Former Smoker    Types: Cigarettes    Quit date: 07/31/1996  . Smokeless tobacco: Never Used  . Alcohol use Yes     Comment: very little  . Drug use: No  . Sexual activity: Not Asked   Other Topics Concern  . None   Social History Narrative  . None     Current Outpatient Prescriptions:  .  amLODipine (NORVASC) 10 MG tablet, TAKE 1 TABLET (10 MG TOTAL) BY MOUTH DAILY., Disp: 90 tablet, Rfl: 1 .  hydrochlorothiazide (HYDRODIURIL) 25 MG tablet, TAKE 1 TABLET (25 MG TOTAL) BY MOUTH DAILY., Disp: 90 tablet, Rfl: 0 .  ibuprofen (ADVIL,MOTRIN) 200 MG tablet, Take 600-800 mg by mouth every 8 (eight) hours as needed for mild pain., Disp: , Rfl:  .  losartan (COZAAR) 50 MG tablet, TAKE 1 TABLET (50 MG TOTAL) BY MOUTH DAILY., Disp: 90 tablet, Rfl: 1 .  pravastatin (PRAVACHOL) 40 MG tablet, TAKE 1 TABLET (40 MG TOTAL) BY MOUTH DAILY., Disp: 30 tablet, Rfl: 2 .  tiZANidine (ZANAFLEX) 4 MG tablet, Take 1 tablet (4 mg total) by mouth at bedtime as needed for muscle  spasms., Disp: 30 tablet, Rfl: 0  EXAM:  Vitals:   02/06/16 0756  BP: 118/82  Pulse: 78  Temp: 98.7 F (37.1 C)    Body mass index is 41.09 kg/m.  GENERAL: vitals reviewed and listed above, alert, oriented, appears well hydrated and in no acute distress  HEENT: atraumatic, conjunttiva clear, no obvious abnormalities on inspection of external nose and ears  NECK: no obvious masses on inspection  LUNGS: clear to auscultation bilaterally, no wheezes, rales or rhonchi, good air movement  CV: HRRR, no peripheral edema  MS: moves all extremities without noticeable abnormality  PSYCH: pleasant and cooperative, no obvious depression or anxiety  ASSESSMENT AND PLAN:  Discussed the following assessment and plan:  Essential hypertension - Plan: Basic metabolic panel, CBC  (no diff) -labs -do not think it would be a good idea to stop meds now -advise healthy diet, wt reduction and exercise  BMI 40.0-44.9, adult (HCC) Hyperlipidemia, unspecified hyperlipidemia type Hyperglycemia -lifestyle recs  Chronic low back pain without sciatica, unspecified back pain laterality -managed by specialist -trial prn muscle relaxer, tizanidine, at night for bad nights -advised wt reduction, core exercises and regular aerobic exercise  -Patient advised to return or notify a doctor immediately if symptoms worsen or persist or new concerns arise.  Patient Instructions  BEFORE YOU LEAVE: -follow up: 3-4 months -labs  We have ordered labs or studies at this visit. It can take up to 1-2 weeks for results and processing. IF results require follow up or explanation, we will call you with instructions. Clinically stable results will be released to your [facility]. If you have not heard from [place] or cannot find your results in [facility] in 2 weeks please contact our office at [phone].  If you are not yet signed up for [facility], please consider signing up.   We recommend the following healthy lifestyle for LIFE: 1) Small portions.   Tip: eat off of a salad plate instead of a dinner plate.   Tip: if you need more or a snack choose fruits, veggies and/or a handful of nuts or seeds.  2) Eat a healthy clean diet.  * Tip: Avoid (less then 1 serving per week): processed foods, sweets, sweetened drinks, white starches (rice, flour, bread, potatoes, pasta, etc), red meat, fast foods, butter  *Tip: CHOOSE instead   * 5-9 servings per day of fresh or frozen fruits and vegetables (but not corn, potatoes, bananas, canned or dried fruit)   *nuts and seeds, beans   *olives and olive oil   *small portions of lean meats such as fish and white chicken    *small portions of whole grains  3)Get at least 150 minutes of sweaty aerobic exercise per week.  4)Reduce stress - consider counseling,  meditation and relaxation to balance other aspects of your life.          Colin Benton R., DO

## 2016-02-06 ENCOUNTER — Ambulatory Visit (INDEPENDENT_AMBULATORY_CARE_PROVIDER_SITE_OTHER): Payer: 59 | Admitting: Family Medicine

## 2016-02-06 ENCOUNTER — Encounter: Payer: Self-pay | Admitting: Family Medicine

## 2016-02-06 VITALS — BP 118/82 | HR 78 | Temp 98.7°F | Ht 70.0 in | Wt 286.4 lb

## 2016-02-06 DIAGNOSIS — Z6841 Body Mass Index (BMI) 40.0 and over, adult: Secondary | ICD-10-CM

## 2016-02-06 DIAGNOSIS — I10 Essential (primary) hypertension: Secondary | ICD-10-CM

## 2016-02-06 DIAGNOSIS — M545 Low back pain, unspecified: Secondary | ICD-10-CM

## 2016-02-06 DIAGNOSIS — E785 Hyperlipidemia, unspecified: Secondary | ICD-10-CM | POA: Diagnosis not present

## 2016-02-06 DIAGNOSIS — R739 Hyperglycemia, unspecified: Secondary | ICD-10-CM | POA: Diagnosis not present

## 2016-02-06 DIAGNOSIS — G8929 Other chronic pain: Secondary | ICD-10-CM

## 2016-02-06 LAB — BASIC METABOLIC PANEL
BUN: 15 mg/dL (ref 6–23)
CALCIUM: 9.6 mg/dL (ref 8.4–10.5)
CO2: 29 meq/L (ref 19–32)
Chloride: 107 mEq/L (ref 96–112)
Creatinine, Ser: 0.84 mg/dL (ref 0.40–1.20)
GFR: 90.83 mL/min (ref >60.00)
GLUCOSE: 102 mg/dL — AB (ref 70–99)
POTASSIUM: 3.8 meq/L (ref 3.5–5.1)
Sodium: 144 mEq/L (ref 135–145)

## 2016-02-06 LAB — CBC
HCT: 39.3 % (ref 36.0–46.0)
HEMOGLOBIN: 13.1 g/dL (ref 12.0–15.0)
MCHC: 33.4 g/dL (ref 30.0–36.0)
MCV: 87.5 fl (ref 78.0–100.0)
Platelets: 272 10*3/uL (ref 150.0–400.0)
RBC: 4.49 Mil/uL (ref 3.87–5.11)
RDW: 15.1 % (ref 11.5–15.5)
WBC: 4.6 10*3/uL (ref 4.0–10.5)

## 2016-02-06 MED ORDER — TIZANIDINE HCL 4 MG PO TABS: 4.0000 mg | ORAL_TABLET | Freq: Every evening | ORAL | 0 refills | Status: DC | PRN

## 2016-02-06 NOTE — Patient Instructions (Signed)
BEFORE YOU LEAVE: -follow up: 3-4 months -labs  We have ordered labs or studies at this visit. It can take up to 1-2 weeks for results and processing. IF results require follow up or explanation, we will call you with instructions. Clinically stable results will be released to your MYCHART. If you have not heard from us or cannot find your results in MYCHART in 2 weeks please contact our office at [phone].  If you are not yet signed up for MYCH[facility], please consider signing up.   We recommend the following healthy lifestyle for LIFE: 1) Small portions.   Tip: eat off of a salad plate instead of a dinner plate.  Tip: if you need more or a snack choose fruits, veggies and/or a handful of nuts or seeds.  2) Eat a healthy clean diet.  * Tip: Avoid (less then 1 serving per week): processed foods, sweets, sweetened drinks, white starches (rice, flour, bread, potatoes, pasta, etc), red meat, fast foods, butter  *Tip: CHOOSE instead   * 5-9 servings per day of fresh or frozen fruits and vegetables (but not corn, potatoes, bananas, canned or dried fruit)   *nuts and seeds, beans   *olives and olive oil   *small portions of lean meats such as fish and white chicken    *small portions of whole grains  3)Get at least 150 minutes of sweaty aerobic exercise per week.  4)Reduce stress - consider counseling, meditation and relaxation to balance other aspects of your life.          

## 2016-02-06 NOTE — Progress Notes (Signed)
Pre visit review using our clinic review tool, if applicable. No additional management support is needed unless otherwise documented below in the visit note. 

## 2016-02-07 ENCOUNTER — Telehealth: Payer: Self-pay | Admitting: Family Medicine

## 2016-02-07 NOTE — Telephone Encounter (Signed)
°  Patient Name: Jamie [name]  DOB: [birth year]    Initial Comment Caller says, was given an Rx for a muscle relaxer which is making her feel nauseated -- she does not want to replace the current , she wants an Rx to help with the side effects.    Nurse Assessment  Nurse: Julien Girt, RN, Almyra Free Date/Time Eilene Ghazi Time): 02/07/2016 1:56:09 PM  Confirm and document reason for call. If symptomatic, describe symptoms. ---Caller states she was seen on Monday for back pain and was given an rx for Tizanidine. States it helped her back but she is having side effects of nausea and lightheaded. She is at work today, drove herself in and feels tired. States she does not want to replace this medication because it is helping, is asking for nausea medication.  Does the patient have any new or worsening symptoms? ---Yes  Will a triage be completed? ---No  Select reason for no triage. ---Patient declined  Please document clinical information provided and list any resource used. ---NKDA, Pharmacy information in EPIC. Advised the caller that I would forward her request and to cb as needed. Stated she would like to have a call back to let her know if this has been sent to her pharmacy. [name] will cb as needed.     Guidelines    Guideline Title Affirmed Question Affirmed Notes       Final Disposition User   Clinical Call Julien Girt, RN, Almyra Free    Comments  [email or web address]

## 2016-02-08 NOTE — Telephone Encounter (Signed)
Spoke with pt and advised. She will try half dose with food. Advised her to contact office if nausea continues.

## 2016-02-08 NOTE — Telephone Encounter (Signed)
Pt states that Zanaflex makes her nauseous. She does not want to stop taking the medication, she would like something to help with the nausea.   Dr. Maudie Mercury - Please advise. Thanks!

## 2016-02-08 NOTE — Telephone Encounter (Signed)
I think that taking this AND something for nausea would actually potentially make her feel worse. I would advise trying a half dose and only taking at in the evening with with food.

## 2016-02-22 DIAGNOSIS — Z01411 Encounter for gynecological examination (general) (routine) with abnormal findings: Secondary | ICD-10-CM | POA: Diagnosis not present

## 2016-03-03 ENCOUNTER — Other Ambulatory Visit: Payer: Self-pay | Admitting: Family Medicine

## 2016-03-05 NOTE — Telephone Encounter (Signed)
Ok to refill x1. Would not advise using on a regular nightly basis - advise trying to limit to twice per week.follow up in 1 month if feeling continued need for this medication.

## 2016-04-10 ENCOUNTER — Other Ambulatory Visit: Payer: Self-pay | Admitting: Family Medicine

## 2016-04-19 ENCOUNTER — Telehealth: Payer: Self-pay | Admitting: *Deleted

## 2016-04-19 NOTE — Telephone Encounter (Signed)
This was not intended for regular use, but rather for rare occassions. Advise she follow up with specialist if requiring for regular use. Thanks.

## 2016-04-19 NOTE — Telephone Encounter (Signed)
[facility] 90 day supply on Tizanidine 4mg -take 1 tablet at bedtime as needed for muscle spasms.  Last refill was sent by you on 4/11 for #15.

## 2016-04-20 NOTE — Telephone Encounter (Signed)
I called the pt and informed her of the message below and she stated she cancelled this refill request at her pharmacy as this medication "made her blow up".  She stated she still has back pain at times and I advised she call the specialist for an appt and call back if needed and she agreed.

## 2016-04-27 DIAGNOSIS — N62 Hypertrophy of breast: Secondary | ICD-10-CM | POA: Diagnosis not present

## 2016-05-09 DIAGNOSIS — M25561 Pain in right knee: Secondary | ICD-10-CM | POA: Diagnosis not present

## 2016-05-09 DIAGNOSIS — M25562 Pain in left knee: Secondary | ICD-10-CM | POA: Diagnosis not present

## 2016-05-09 DIAGNOSIS — M170 Bilateral primary osteoarthritis of knee: Secondary | ICD-10-CM | POA: Diagnosis not present

## 2016-06-04 NOTE — Progress Notes (Signed)
HPI:  Jamie [name] is a pleasant 56 y.o. here for follow up. Chronic medical problems summarized below were reviewed for changes and stability and were updated as needed below. These issues and their treatment remain stable for the most part. She does have chronic LE edema, she feels is a bit worse. Sees orthof for her back and knees and is considering breast reduction surgery. Is frustrated with wt loss. Exercising on regular basis. Admits to carbs being and issue in diet. Wants something for her knees has had injections which help sometimes and sometimes don't help - is going to the beach. Denies CP, SOB, DOE, treatment intolerance or new symptoms. Due for labs: BMP, CBC; preventive visit Oct  HTN/LE edema - chronic: -meds:amlodipine 10, hctz 12.5, losartan 50 -she does have leg cramps, reports her whole life - more at night - better since lowering diuretic  GERD: -meds: ranitidine prn -she had an EGD in 2014  Obesity/HLD/Hyperglycemia: -on statin  Chronic low back pain, hip pain, occ radicular symptoms, knee OA: -seeing Dr. Lannette Donath  ROS: See pertinent positives and negatives per HPI.  Past Medical History:  Diagnosis Date  . Allergy   . Arthritis    in back  . Chest pain    neg cath 2016 after false positive myoview  . GERD (gastroesophageal reflux disease)   . Hyperlipidemia   . Hypertension   . Migraine   . Mitral valve prolapse   . Muscle cramps     Past Surgical History:  Procedure Laterality Date  . BACK SURGERY    . CESAREAN SECTION    . LEFT HEART CATHETERIZATION WITH CORONARY ANGIOGRAM N/A 07/08/2013   Procedure: LEFT HEART CATHETERIZATION WITH CORONARY ANGIOGRAM;  Surgeon: Josue Hector, MD;  Location: [facility] CATH LAB;  Service: Cardiovascular;  Laterality: N/A;  . TUBAL LIGATION      Family History  Problem Relation Age of Onset  . Hyperlipidemia Mother   . Hypertension Mother   . Stroke Father   . Hyperlipidemia Brother        x2  . Hypertension  Brother        x2  . Liver cancer Maternal Aunt     Social History   Social History  . Marital status: Married    Spouse name: N/A  . Number of children: 1  . Years of education: N/A   Occupational History  .  [employer]   Social History Main Topics  . Smoking status: Former Smoker    Types: Cigarettes    Quit date: 07/31/1996  . Smokeless tobacco: Never Used  . Alcohol use Yes     Comment: very little  . Drug use: No  . Sexual activity: Not Asked   Other Topics Concern  . None   Social History Narrative  . None     Current Outpatient Prescriptions:  .  amLODipine (NORVASC) 5 MG tablet, Take 1.5 tablets (7.5 mg total) by mouth daily., Disp: 45 tablet, Rfl: 3 .  hydrochlorothiazide (HYDRODIURIL) 25 MG tablet, TAKE 1 TABLET (25 MG TOTAL) BY MOUTH DAILY., Disp: 90 tablet, Rfl: 0 .  ibuprofen (ADVIL,MOTRIN) 200 MG tablet, Take 600-800 mg by mouth every 8 (eight) hours as needed for mild pain., Disp: , Rfl:  .  losartan (COZAAR) 100 MG tablet, Take 1 tablet (100 mg total) by mouth daily., Disp: 90 tablet, Rfl: 3 .  pravastatin (PRAVACHOL) 40 MG tablet, TAKE 1 TABLET (40 MG TOTAL) BY MOUTH DAILY., Disp: 30 tablet,  Rfl: 2 .  tiZANidine (ZANAFLEX) 4 MG tablet, TAKE 1 TABLET (4 MG TOTAL) BY MOUTH AT BEDTIME AS NEEDED FOR MUSCLE SPASMS., Disp: 15 tablet, Rfl: 0 .  diclofenac sodium (VOLTAREN) 1 % GEL, Apply 4 g topically 2 (two) times daily as needed., Disp: 112 g, Rfl: 1  EXAM:  Vitals:   06/05/16 0741  BP: 122/90  Pulse: 71  Temp: 98.1 F (36.7 C)    Body mass index is 41.76 kg/m.  GENERAL: vitals reviewed and listed above, alert, oriented, appears well hydrated and in no acute distress  HEENT: atraumatic, conjunttiva clear, no obvious abnormalities on inspection of external nose and ears  NECK: no obvious masses on inspection  LUNGS: clear to auscultation bilaterally, no wheezes, rales or rhonchi, good air movement  CV: HRRR, no peripheral  edema  MS: moves all extremities without noticeable abnormality  PSYCH: pleasant and cooperative, no obvious depression or anxiety  ASSESSMENT AND PLAN:  Discussed the following assessment and plan:  Hypertension, unspecified type - Plan: Basic metabolic panel, CBC Leg edema -will try lowering norvasc and increasing losartan to maybe help the edema -check electrolytes and if ok consideration lasix or increasing diuretic, though she feels worsens leg cramps, elevate, comptression  Chronic pain of both knees -sees ortho -discussed options -plans to try topical voltaren gel for a few weeks  BMI 40.0-44.9, adult (HCC) Hyperlipidemia, unspecified hyperlipidemia type -lifestyle recs, discussed that diet is the most important factor for weight loss and advise small portion of healthy whole foods -advised continued exercise for health and toning  -Patient advised to return or notify a doctor immediately if symptoms worsen or persist or new concerns arise.  Patient Instructions  BEFORE YOU LEAVE: -follow up: 1 month  -labs  Decrease norvasc (amlodipine ) to 7.5mg  daily ( I sent a new prescription)  Increase losartan to 100mg  daily (I sent a new prescription)  Please follow pharmacy instructions for the voltaren gel for the knee pain  Advise regular aerobic exercise (at least 150 minutes per week of sweaty exercise) and a healthy diet. Try to eat at least 5-9 servings of vegetables and fruits per day (not corn, potatoes or bananas.) Avoid sweets, red meat, pork, butter, fried foods, fast food, processed food, excessive dairy, eggs and coconut. Replace bad fats with good fats - fish, nuts and seeds, canola oil, olive oil.   We have ordered labs or studies at this visit. It can take up to 1-2 weeks for results and processing. IF results require follow up or explanation, we will call you with instructions. Clinically stable results will be released to your [facility]. If you have not heard  from [place] or cannot find your results in [facility] in 2 weeks please contact our office at [phone].  If you are not yet signed up for [facility], please consider signing up.           Colin Benton R., DO

## 2016-06-05 ENCOUNTER — Ambulatory Visit (INDEPENDENT_AMBULATORY_CARE_PROVIDER_SITE_OTHER): Payer: 59 | Admitting: Family Medicine

## 2016-06-05 ENCOUNTER — Encounter: Payer: Self-pay | Admitting: Family Medicine

## 2016-06-05 VITALS — BP 122/90 | HR 71 | Temp 98.1°F | Ht 69.5 in | Wt 286.9 lb

## 2016-06-05 DIAGNOSIS — E785 Hyperlipidemia, unspecified: Secondary | ICD-10-CM | POA: Diagnosis not present

## 2016-06-05 DIAGNOSIS — I10 Essential (primary) hypertension: Secondary | ICD-10-CM

## 2016-06-05 DIAGNOSIS — G8929 Other chronic pain: Secondary | ICD-10-CM

## 2016-06-05 DIAGNOSIS — Z6841 Body Mass Index (BMI) 40.0 and over, adult: Secondary | ICD-10-CM

## 2016-06-05 DIAGNOSIS — R600 Localized edema: Secondary | ICD-10-CM | POA: Diagnosis not present

## 2016-06-05 DIAGNOSIS — M25561 Pain in right knee: Secondary | ICD-10-CM | POA: Diagnosis not present

## 2016-06-05 DIAGNOSIS — M25562 Pain in left knee: Secondary | ICD-10-CM | POA: Diagnosis not present

## 2016-06-05 LAB — BASIC METABOLIC PANEL
BUN: 13 mg/dL (ref 6–23)
CHLORIDE: 106 meq/L (ref 96–112)
CO2: 27 mEq/L (ref 19–32)
Calcium: 9.6 mg/dL (ref 8.4–10.5)
Creatinine, Ser: 0.89 mg/dL (ref 0.40–1.20)
GFR: 84.87 mL/min (ref >60.00)
Glucose, Bld: 103 mg/dL — ABNORMAL HIGH (ref 70–99)
POTASSIUM: 3.8 meq/L (ref 3.5–5.1)
SODIUM: 141 meq/L (ref 135–145)

## 2016-06-05 LAB — CBC
HEMATOCRIT: 40.7 % (ref 36.0–46.0)
HEMOGLOBIN: 13.6 g/dL (ref 12.0–15.0)
MCHC: 33.5 g/dL (ref 30.0–36.0)
MCV: 88 fl (ref 78.0–100.0)
Platelets: 262 10*3/uL (ref 150.0–400.0)
RBC: 4.63 Mil/uL (ref 3.87–5.11)
RDW: 15.3 % (ref 11.5–15.5)
WBC: 5.3 10*3/uL (ref 4.0–10.5)

## 2016-06-05 MED ORDER — DICLOFENAC SODIUM 1 % TD GEL: 4.0000 g | Freq: Two times a day (BID) | TRANSDERMAL | 1 refills | Status: DC | PRN

## 2016-06-05 MED ORDER — AMLODIPINE BESYLATE 5 MG PO TABS: 7.5000 mg | ORAL_TABLET | Freq: Every day | ORAL | 3 refills | Status: DC

## 2016-06-05 MED ORDER — LOSARTAN POTASSIUM 100 MG PO TABS: 100.0000 mg | ORAL_TABLET | Freq: Every day | ORAL | 3 refills | Status: DC

## 2016-06-05 NOTE — Patient Instructions (Signed)
BEFORE YOU LEAVE: -follow up: 1 month  -labs  Decrease norvasc (amlodipine ) to 7.5mg  daily ( I sent a new prescription)  Increase losartan to 100mg  daily (I sent a new prescription)  Please follow pharmacy instructions for the voltaren gel for the knee pain  Advise regular aerobic exercise (at least 150 minutes per week of sweaty exercise) and a healthy diet. Try to eat at least 5-9 servings of vegetables and fruits per day (not corn, potatoes or bananas.) Avoid sweets, red meat, pork, butter, fried foods, fast food, processed food, excessive dairy, eggs and coconut. Replace bad fats with good fats - fish, nuts and seeds, canola oil, olive oil.   We have ordered labs or studies at this visit. It can take up to 1-2 weeks for results and processing. IF results require follow up or explanation, we will call you with instructions. Clinically stable results will be released to your [facility]. If you have not heard from [place] or cannot find your results in [facility] in 2 weeks please contact our office at [phone].  If you are not yet signed up for [facility], please consider signing up.

## 2016-06-20 DIAGNOSIS — B009 Herpesviral infection, unspecified: Secondary | ICD-10-CM | POA: Insufficient documentation

## 2016-07-12 ENCOUNTER — Other Ambulatory Visit: Payer: Self-pay | Admitting: Family Medicine

## 2016-07-12 MED ORDER — AMLODIPINE BESYLATE 5 MG PO TABS: 7.5000 mg | ORAL_TABLET | Freq: Every day | ORAL | 1 refills | Status: DC

## 2016-07-12 NOTE — Telephone Encounter (Signed)
[facility].  Pharmacy now requesting 90 day supply

## 2016-07-18 NOTE — Progress Notes (Signed)
HPI:  Follow up HTN: -had some increased LE edema last visit - increased losartan to 100 and decreased norvasc to 7.5, consideration lasix - though she feels diuretic worsens her leg cramps -doing much better and feels this is a good regimen for her -frustrated about her weight - admits to lots of sugar in diet and admits knows what to do - just not doing it -no SOb, CP, increased swelling, palpitations, increased leg cramps  ROS: See pertinent positives and negatives per HPI.  Past Medical History:  Diagnosis Date  . Allergy   . Arthritis    in back  . Chest pain    neg cath 2016 after false positive myoview  . GERD (gastroesophageal reflux disease)   . Hyperlipidemia   . Hypertension   . Migraine   . Mitral valve prolapse   . Muscle cramps     Past Surgical History:  Procedure Laterality Date  . BACK SURGERY    . CESAREAN SECTION    . LEFT HEART CATHETERIZATION WITH CORONARY ANGIOGRAM N/A 07/08/2013   Procedure: LEFT HEART CATHETERIZATION WITH CORONARY ANGIOGRAM;  Surgeon: Josue Hector, MD;  Location: [facility] CATH LAB;  Service: Cardiovascular;  Laterality: N/A;  . TUBAL LIGATION      Family History  Problem Relation Age of Onset  . Hyperlipidemia Mother   . Hypertension Mother   . Stroke Father   . Hyperlipidemia Brother        x2  . Hypertension Brother        x2  . Liver cancer Maternal Aunt     Social History   Social History  . Marital status: Married    Spouse name: N/A  . Number of children: 1  . Years of education: N/A   Occupational History  .  [employer]   Social History Main Topics  . Smoking status: Former Smoker    Types: Cigarettes    Quit date: 07/31/1996  . Smokeless tobacco: Never Used  . Alcohol use Yes     Comment: very little  . Drug use: No  . Sexual activity: Not Asked   Other Topics Concern  . None   Social History Narrative  . None     Current Outpatient Prescriptions:  .  amLODipine (NORVASC) 5 MG tablet,  Take 1.5 tablets (7.5 mg total) by mouth daily., Disp: 135 tablet, Rfl: 1 .  diclofenac sodium (VOLTAREN) 1 % GEL, Apply 4 g topically 2 (two) times daily as needed., Disp: 112 g, Rfl: 1 .  hydrochlorothiazide (HYDRODIURIL) 25 MG tablet, TAKE 1 TABLET (25 MG TOTAL) BY MOUTH DAILY., Disp: 90 tablet, Rfl: 3 .  ibuprofen (ADVIL,MOTRIN) 200 MG tablet, Take 600-800 mg by mouth every 8 (eight) hours as needed for mild pain., Disp: , Rfl:  .  losartan (COZAAR) 100 MG tablet, Take 1 tablet (100 mg total) by mouth daily., Disp: 90 tablet, Rfl: 3 .  pravastatin (PRAVACHOL) 40 MG tablet, TAKE 1 TABLET (40 MG TOTAL) BY MOUTH DAILY., Disp: 90 tablet, Rfl: 3 .  tiZANidine (ZANAFLEX) 4 MG tablet, TAKE 1 TABLET (4 MG TOTAL) BY MOUTH AT BEDTIME AS NEEDED FOR MUSCLE SPASMS., Disp: 15 tablet, Rfl: 0  EXAM:  Vitals:   07/19/16 0726  BP: 118/82  Pulse: 85  Temp: 98.4 F (36.9 C)    Body mass index is 41.7 kg/m.  GENERAL: vitals reviewed and listed above, alert, oriented, appears well hydrated and in no acute distress  HEENT: atraumatic, conjunttiva clear, no  obvious abnormalities on inspection of external nose and ears  NECK: no obvious masses on inspection  LUNGS: clear to auscultation bilaterally, no wheezes, rales or rhonchi, good air movement  CV: HRRR, bilat 1+ ankle edema  MS: moves all extremities without noticeable abnormality  PSYCH: pleasant and cooperative, no obvious depression or anxiety  ASSESSMENT AND PLAN:  Discussed the following assessment and plan:  Hypertension, unspecified type  Leg edema  Class 3 obesity with serious comorbidity and body mass index (BMI) of 40.0 to 44.9 in adult, unspecified obesity type ([place])  -cont current med -lifestyle recs dicussed at length and advise healthy, regular, small low sugar meals - avoid added sugar and simple carbs -advised regular exercise -refills as needed -pt wants to check thyroid at next visit when does labs - we checked this  several times in the past including last year and it was normal -Patient advised to return sooner if new concerns arise.  Patient Instructions  BEFORE YOU LEAVE: -follow up: 3 months - will plan to check labs then.  Continue current medications.   We recommend the following healthy lifestyle for LIFE: 1) Small portions.   Tip: eat off of a salad plate instead of a dinner plate.  Tip: It is ok to feel hungry after a meal - that likely means you ate an appropriate portion.  Tip: if you need more or a snack choose fruits, veggies and/or a handful of nuts or seeds.  2) Eat a healthy clean diet.   TRY TO EAT: -at least 5-7 servings of low sugar vegetables per day (not corn, potatoes or bananas.) -berries are the best choice if you wish to eat fruit.   -lean meets (fish, chicken or [place] breasts) -vegan proteins for some meals - beans or tofu, whole grains, nuts and seeds -Replace bad fats with good fats - good fats include: fish, nuts and seeds, canola oil, olive oil -small amounts of low fat or non fat dairy -small amounts of100 % whole grains - check the lables  AVOID: -SUGAR, sweets, anything with added sugar, corn syrup or sweeteners -if you must have a sweetener, small amounts of stevia may be best -sweetened beverages -simple starches (rice, bread, potatoes, pasta, chips, etc - small amounts of 100% whole grains are ok) -red meat, pork, butter -fried foods, fast food, processed food, excessive dairy, eggs and coconut.  3)Get at least 150 minutes of sweaty aerobic exercise per week.  4)Reduce stress - consider counseling, meditation and relaxation to balance other aspects of your life.     Colin Benton R., DO

## 2016-07-19 ENCOUNTER — Encounter: Payer: Self-pay | Admitting: Family Medicine

## 2016-07-19 ENCOUNTER — Ambulatory Visit (INDEPENDENT_AMBULATORY_CARE_PROVIDER_SITE_OTHER): Payer: 59 | Admitting: Family Medicine

## 2016-07-19 VITALS — BP 118/82 | HR 85 | Temp 98.4°F | Ht 69.5 in | Wt 286.5 lb

## 2016-07-19 DIAGNOSIS — I10 Essential (primary) hypertension: Secondary | ICD-10-CM

## 2016-07-19 DIAGNOSIS — R600 Localized edema: Secondary | ICD-10-CM

## 2016-07-19 DIAGNOSIS — Z6841 Body Mass Index (BMI) 40.0 and over, adult: Secondary | ICD-10-CM

## 2016-07-19 DIAGNOSIS — IMO0001 Reserved for inherently not codable concepts without codable children: Secondary | ICD-10-CM

## 2016-07-19 DIAGNOSIS — E669 Obesity, unspecified: Secondary | ICD-10-CM

## 2016-07-19 MED ORDER — HYDROCHLOROTHIAZIDE 25 MG PO TABS: ORAL_TABLET | ORAL | 3 refills | Status: DC

## 2016-07-19 MED ORDER — PRAVASTATIN SODIUM 40 MG PO TABS: ORAL_TABLET | ORAL | 3 refills | Status: DC

## 2016-07-19 NOTE — Patient Instructions (Signed)
BEFORE YOU LEAVE: -follow up: 3 months - will plan to check labs then.  Continue current medications.   We recommend the following healthy lifestyle for LIFE: 1) Small portions.   Tip: eat off of a salad plate instead of a dinner plate.  Tip: It is ok to feel hungry after a meal - that likely means you ate an appropriate portion.  Tip: if you need more or a snack choose fruits, veggies and/or a handful of nuts or seeds.  2) Eat a healthy clean diet.   TRY TO EAT: -at least 5-7 servings of low sugar vegetables per day (not corn, potatoes or bananas.) -berries are the best choice if you wish to eat fruit.   -lean meets (fish, chicken or [place] breasts) -vegan proteins for some meals - beans or tofu, whole grains, nuts and seeds -Replace bad fats with good fats - good fats include: fish, nuts and seeds, canola oil, olive oil -small amounts of low fat or non fat dairy -small amounts of100 % whole grains - check the lables  AVOID: -SUGAR, sweets, anything with added sugar, corn syrup or sweeteners -if you must have a sweetener, small amounts of stevia may be best -sweetened beverages -simple starches (rice, bread, potatoes, pasta, chips, etc - small amounts of 100% whole grains are ok) -red meat, pork, butter -fried foods, fast food, processed food, excessive dairy, eggs and coconut.  3)Get at least 150 minutes of sweaty aerobic exercise per week.  4)Reduce stress - consider counseling, meditation and relaxation to balance other aspects of your life.

## 2016-07-26 DIAGNOSIS — S43492A Other sprain of left shoulder joint, initial encounter: Secondary | ICD-10-CM | POA: Diagnosis not present

## 2016-07-28 ENCOUNTER — Other Ambulatory Visit: Payer: Self-pay | Admitting: Family Medicine

## 2016-08-13 ENCOUNTER — Telehealth: Payer: Self-pay | Admitting: Family Medicine

## 2016-08-13 NOTE — Telephone Encounter (Signed)
I called the pt and advised her she should be taking Amlodipine 7.5mg  a day according to the last office note by Dr Maudie Mercury.  Patient stated she has an Rx for a 10mg  pill and was confused.  I called the pharmacy and spoke with Santiago Glad the pharmacist and she was informed of the dose the pt should be taking.  Santiago Glad stated the Rx from 7/12 was on hold and she will get this ready for the pt and I called the pt and informed her of this.

## 2016-08-13 NOTE — Telephone Encounter (Signed)
Pt would like to have a call back to clarify Rx amlodipine.

## 2016-09-19 ENCOUNTER — Other Ambulatory Visit: Payer: Self-pay | Admitting: Obstetrics and Gynecology

## 2016-09-19 DIAGNOSIS — Z1231 Encounter for screening mammogram for malignant neoplasm of breast: Secondary | ICD-10-CM

## 2016-09-20 ENCOUNTER — Encounter: Payer: Self-pay | Admitting: Family Medicine

## 2016-10-29 ENCOUNTER — Ambulatory Visit
Admission: RE | Admit: 2016-10-29 | Discharge: 2016-10-29 | Disposition: A | Discharge status: Home / Self Care | Payer: 59 | Source: Ambulatory Visit | Attending: Obstetrics and Gynecology | Admitting: Obstetrics and Gynecology

## 2016-10-29 DIAGNOSIS — Z1231 Encounter for screening mammogram for malignant neoplasm of breast: Secondary | ICD-10-CM

## 2017-01-01 LAB — HM COLONOSCOPY

## 2017-01-10 ENCOUNTER — Encounter: Payer: Self-pay | Admitting: Family Medicine

## 2017-01-16 ENCOUNTER — Ambulatory Visit: Payer: Self-pay | Admitting: *Deleted

## 2017-01-16 NOTE — Telephone Encounter (Signed)
Pt comlains of back pain that shoots into her right hip bone and buttocks at times; she reports that she has been take 3-4 motrin 5-6 times daily; per nurse triage recommendation made that pt see physician immediately but she states that she is too busy to do that today or tomorrow; also discussed with pt the option of urgent care or ED; she verbalizes understanding and states that she "will figure something out; will route to [facility] pool because she would like for her MD to call something in; pt can be contacted at [phone]; will route to [facility] for notification of this encounter.  Reason for Disposition . [1] Pain radiates into the thigh or further down the leg AND [2] both legs  Answer Assessment - Initial Assessment Questions 1. ONSET: "When did the pain begin?"      01/02/17 2. LOCATION: "Where does it hurt?" (upper, mid or lower back)     Moves all around and spasms in different areas and different times 3. SEVERITY: "How bad is the pain?"  (e.g., Scale 1-10; mild, moderate, or severe)   - MILD (1-3): doesn't interfere with normal activities    - MODERATE (4-7): interferes with normal activities or awakens from sleep    - SEVERE (8-10): excruciating pain, unable to do any normal activities      12 out of 10 at times; 7-8 now 4. PATTERN: "Is the pain constant?" (e.g., yes, no; constant, intermittent)      constant 5. RADIATION: "Does the pain shoot into your legs or elsewhere?"     Starting to shoot into right hip bone and buttocks  6. CAUSE:  "What do you think is causing the back pain?"     unsure 7. BACK OVERUSE:  "Any recent lifting of heavy objects, strenuous work or exercise?"     no 8. MEDICATIONS: "What have you taken so far for the pain?" (e.g., nothing, acetaminophen, NSAIDS)   3-4 tablets 5-6 times a day 9. NEUROLOGIC SYMPTOMS: "Do you have any weakness, numbness, or problems with bowel/bladder control?"     no 10. OTHER SYMPTOMS: "Do you have any other  symptoms?" (e.g., fever, abdominal pain, burning with urination, blood in urine)    no 11. PREGNANCY: "Is there any chance you are pregnant?" (e.g., yes, no; LMP)       No; LMP 2 years ago  Protocols used: BACK PAIN-A-AH

## 2017-01-16 NOTE — Telephone Encounter (Signed)
See previous encounters dated 01/16/17 at 0830 and 0846.

## 2017-01-16 NOTE — Telephone Encounter (Signed)
I spoke with pt and she has made an appointment with back doctor.

## 2017-01-22 ENCOUNTER — Ambulatory Visit (INDEPENDENT_AMBULATORY_CARE_PROVIDER_SITE_OTHER): Payer: 59 | Admitting: *Deleted

## 2017-01-22 DIAGNOSIS — M5416 Radiculopathy, lumbar region: Secondary | ICD-10-CM | POA: Diagnosis not present

## 2017-01-22 DIAGNOSIS — M5136 Other intervertebral disc degeneration, lumbar region: Secondary | ICD-10-CM | POA: Diagnosis not present

## 2017-01-22 DIAGNOSIS — Z23 Encounter for immunization: Secondary | ICD-10-CM

## 2017-01-22 DIAGNOSIS — M545 Low back pain: Secondary | ICD-10-CM | POA: Diagnosis not present

## 2017-01-28 DIAGNOSIS — M545 Low back pain: Secondary | ICD-10-CM | POA: Diagnosis not present

## 2017-01-30 DIAGNOSIS — M545 Low back pain: Secondary | ICD-10-CM | POA: Diagnosis not present

## 2017-02-04 DIAGNOSIS — M48061 Spinal stenosis, lumbar region without neurogenic claudication: Secondary | ICD-10-CM | POA: Insufficient documentation

## 2017-02-04 DIAGNOSIS — M5416 Radiculopathy, lumbar region: Secondary | ICD-10-CM | POA: Diagnosis not present

## 2017-02-04 DIAGNOSIS — M5136 Other intervertebral disc degeneration, lumbar region: Secondary | ICD-10-CM | POA: Diagnosis not present

## 2017-02-04 DIAGNOSIS — M545 Low back pain, unspecified: Secondary | ICD-10-CM | POA: Insufficient documentation

## 2017-02-12 ENCOUNTER — Other Ambulatory Visit: Payer: Self-pay | Admitting: Family Medicine

## 2017-02-22 NOTE — Progress Notes (Signed)
Chief Complaint  Patient presents with  . Cough    HPI: Jamie [name] 57 y.o. Patient comes in today for [facility] Saturday clinic for  Couple of  Concerns   Onset 3+ dayas ago  .  Sore throat and worse and then headache and ear and cough congestion weak feeling   And   Increase urination  ?   No documental .  Some phelghm.   Little sob .  Flu vaccine  Yes . Exposed to adult public    Has low  BP for a few weeks different than  Pain with her spine problem rx by dr Rolena Infante. No blood  Not sure if could have uti change in urinary pattern.   ROS: See pertinent positives and negatives per HPI. No hematuria vomiting   Past Medical History:  Diagnosis Date  . Allergy   . Arthritis    in back  . Chest pain    neg cath 2016 after false positive myoview  . GERD (gastroesophageal reflux disease)   . Hyperlipidemia   . Hypertension   . Migraine   . Mitral valve prolapse   . Muscle cramps     Family History  Problem Relation Age of Onset  . Hyperlipidemia Mother   . Hypertension Mother   . Stroke Father   . Hyperlipidemia Brother        x2  . Hypertension Brother        x2  . Liver cancer Maternal Aunt     Social History   Socioeconomic History  . Marital status: Married    Spouse name: None  . Number of children: 1  . Years of education: None  . Highest education level: None  Social Needs  . Financial resource strain: None  . Food insecurity - worry: None  . Food insecurity - inability: None  . Transportation needs - medical: None  . Transportation needs - non-medical: None  Occupational History    Employer: [employer]  Tobacco Use  . Smoking status: Former Smoker    Types: Cigarettes    Last attempt to quit: 07/31/1996    Years since quitting: 20.5  . Smokeless tobacco: Never Used  Substance and Sexual Activity  . Alcohol use: Yes    Comment: very little  . Drug use: No  . Sexual activity: None  Other Topics Concern  . None  Social History Narrative    . None    Outpatient Medications Prior to Visit  Medication Sig Dispense Refill  . amLODipine (NORVASC) 10 MG tablet TAKE 1 TABLET (10 MG TOTAL) BY MOUTH DAILY. 90 tablet 0  . amLODipine (NORVASC) 5 MG tablet TAKE 1.5 TABLETS (7.5 MG TOTAL) BY MOUTH DAILY. 135 tablet 0  . diclofenac sodium (VOLTAREN) 1 % GEL Apply 4 g topically 2 (two) times daily as needed. 112 g 1  . gabapentin (NEURONTIN) 300 MG capsule Neurontin 300 mg capsule  Take 1 capsule every day by oral route.  take 1 tab at bedtime    . hydrochlorothiazide (HYDRODIURIL) 25 MG tablet TAKE 1 TABLET (25 MG TOTAL) BY MOUTH DAILY. 90 tablet 3  . ibuprofen (ADVIL,MOTRIN) 200 MG tablet Take 600-800 mg by mouth every 8 (eight) hours as needed for mild pain.    Marland Kitchen losartan (COZAAR) 100 MG tablet Take 1 tablet (100 mg total) by mouth daily. 90 tablet 3  . losartan (COZAAR) 50 MG tablet TAKE 1 TABLET (50 MG TOTAL) BY MOUTH DAILY. 90 tablet 0  .  pravastatin (PRAVACHOL) 40 MG tablet TAKE 1 TABLET (40 MG TOTAL) BY MOUTH DAILY. 90 tablet 3  . tiZANidine (ZANAFLEX) 4 MG tablet TAKE 1 TABLET (4 MG TOTAL) BY MOUTH AT BEDTIME AS NEEDED FOR MUSCLE SPASMS. 15 tablet 0   No facility-administered medications prior to visit.      EXAM:  BP 132/76   Pulse 78   Temp 98.1 F (36.7 C) (Oral)   Ht 5' 9.5" (1.765 m)   Wt 282 lb 1.9 oz (128 kg)   LMP 10/04/2014   SpO2 99%   BMI 41.06 kg/m   Body mass index is 41.06 kg/m. WDWN in NAD  quiet respirations; mildly congested  somewhat hoarse. Non toxic . HEENT: Normocephalic ;atraumatic , Eyes;  PERRL, EOMs  Full, lids and conjunctiva clear,,Ears: no deformities, canals nl, TM landmarks normal, Nose: no deformity or discharge but congested;face minimally tender Mouth : OP clear without lesion or edema . Neck: Supple without adenopathy or masses or bruits Chest:  Clear to A&P without wheezes rales or rhonchi CV:  S1-S2 no gallops or murmurs peripheral perfusion is normal Skin :nl perfusion and no  acute rashes  Abdomen:  Sof,t normal bowel sounds without hepatosplenomegaly, no guarding rebound or masses no CVA tenderness Back  lbp no pint pain   Well healed lb scar.   PSYCH: pleasant and cooperative, no obvious depression or anxiety  BP Readings from Last 3 Encounters:  02/23/17 132/76  07/19/16 118/82  06/05/16 122/90   poct neg flu and ua   ASSESSMENT AND PLAN:  Discussed the following assessment and plan:  Acute upper respiratory infection of multiple sites - Plan: POC Influenza A&B(BINAX/QUICKVUE)  Urinary frequency - Plan: POCT urinalysis dipstick  Bilateral low back pain without sciatica, unspecified chronicity - hx of back surgery and  under care dr Rolena Infante  History of back surgery  -Patient advised to return or notify health care team  if  new concerns arise.  Patient Instructions   This acts like a viral  Respiratory illness  And no evidence of  pneumonia or bacterial infection. Flu screen in negative  You could have  A mild case of flu since you had vaccine  But his would run its course.  There is also a croup virus  That needs to run its course  With cough and  Laryngitis . Urine  Not  consistent with  for urine infection .   Rest fluids hot   Liquid  and   Symptomatic rx .   mucinex   Plain to   Help loosen the mucous in day and  mucinex dm or similar   For cough suppression at  Night .         Standley Brooking. Panosh M.D.

## 2017-02-23 ENCOUNTER — Encounter: Payer: Self-pay | Admitting: Internal Medicine

## 2017-02-23 ENCOUNTER — Ambulatory Visit (INDEPENDENT_AMBULATORY_CARE_PROVIDER_SITE_OTHER): Payer: 59 | Admitting: Internal Medicine

## 2017-02-23 VITALS — BP 132/76 | HR 78 | Temp 98.1°F | Ht 69.5 in | Wt 282.1 lb

## 2017-02-23 DIAGNOSIS — M545 Low back pain, unspecified: Secondary | ICD-10-CM

## 2017-02-23 DIAGNOSIS — J069 Acute upper respiratory infection, unspecified: Secondary | ICD-10-CM

## 2017-02-23 DIAGNOSIS — Z9889 Other specified postprocedural states: Secondary | ICD-10-CM

## 2017-02-23 DIAGNOSIS — R350 Frequency of micturition: Secondary | ICD-10-CM | POA: Diagnosis not present

## 2017-02-23 LAB — POCT URINALYSIS DIPSTICK
BILIRUBIN UA: NEGATIVE
Blood, UA: NEGATIVE
GLUCOSE UA: NEGATIVE
KETONES UA: NEGATIVE
Leukocytes, UA: NEGATIVE
Nitrite, UA: NEGATIVE
PH UA: 6 (ref 5.0–8.0)
Protein, UA: 15
Spec Grav, UA: 1.02 (ref 1.010–1.025)
Urobilinogen, UA: 1 E.U./dL

## 2017-02-23 LAB — POC INFLUENZA A&B (BINAX/QUICKVUE)
INFLUENZA A, POC: NEGATIVE
Influenza B, POC: NEGATIVE

## 2017-02-23 NOTE — Patient Instructions (Addendum)
  This acts like a viral  Respiratory illness  And no evidence of  pneumonia or bacterial infection. Flu screen in negative  You could have  A mild case of flu since you had vaccine  But his would run its course.  There is also a croup virus  That needs to run its course  With cough and  Laryngitis . Urine  Not  consistent with  for urine infection .   Rest fluids hot   Liquid  and   Symptomatic rx .   mucinex   Plain to   Help loosen the mucous in day and  mucinex dm or similar   For cough suppression at  Night .

## 2017-02-25 DIAGNOSIS — R87610 Atypical squamous cells of undetermined significance on cytologic smear of cervix (ASC-US): Secondary | ICD-10-CM | POA: Diagnosis not present

## 2017-02-25 DIAGNOSIS — D259 Leiomyoma of uterus, unspecified: Secondary | ICD-10-CM | POA: Diagnosis not present

## 2017-02-25 DIAGNOSIS — Z01411 Encounter for gynecological examination (general) (routine) with abnormal findings: Secondary | ICD-10-CM | POA: Diagnosis not present

## 2017-02-25 LAB — HM PAP SMEAR: HM Pap smear: NORMAL

## 2017-02-28 DIAGNOSIS — M48061 Spinal stenosis, lumbar region without neurogenic claudication: Secondary | ICD-10-CM | POA: Diagnosis not present

## 2017-03-18 DIAGNOSIS — M48061 Spinal stenosis, lumbar region without neurogenic claudication: Secondary | ICD-10-CM | POA: Diagnosis not present

## 2017-04-19 ENCOUNTER — Ambulatory Visit: Payer: Self-pay

## 2017-04-19 ENCOUNTER — Encounter (HOSPITAL_COMMUNITY): Payer: Self-pay | Admitting: Emergency Medicine

## 2017-04-19 ENCOUNTER — Emergency Department (HOSPITAL_COMMUNITY)
Admission: EM | Admit: 2017-04-19 | Discharge: 2017-04-19 | Discharge status: Against Medical Advice | Payer: 59 | Attending: Emergency Medicine | Admitting: Emergency Medicine

## 2017-04-19 DIAGNOSIS — R1031 Right lower quadrant pain: Secondary | ICD-10-CM | POA: Diagnosis not present

## 2017-04-19 DIAGNOSIS — Z5321 Procedure and treatment not carried out due to patient leaving prior to being seen by health care provider: Secondary | ICD-10-CM | POA: Diagnosis not present

## 2017-04-19 LAB — I-STAT BETA HCG BLOOD, ED (MC, WL, AP ONLY)

## 2017-04-19 LAB — CBC
HCT: 38.1 % (ref 36.0–46.0)
Hemoglobin: 12.6 g/dL (ref 12.0–15.0)
MCH: 29.2 pg (ref 26.0–34.0)
MCHC: 33.1 g/dL (ref 30.0–36.0)
MCV: 88.4 fL (ref 78.0–100.0)
PLATELETS: 250 10*3/uL (ref 150–400)
RBC: 4.31 MIL/uL (ref 3.87–5.11)
RDW: 15.1 % (ref 11.5–15.5)
WBC: 7.5 10*3/uL (ref 4.0–10.5)

## 2017-04-19 LAB — LIPASE, BLOOD: LIPASE: 23 U/L (ref 11–51)

## 2017-04-19 LAB — URINALYSIS, ROUTINE W REFLEX MICROSCOPIC
Bacteria, UA: NONE SEEN
Bilirubin Urine: NEGATIVE
Glucose, UA: NEGATIVE mg/dL
Hgb urine dipstick: NEGATIVE
Ketones, ur: NEGATIVE mg/dL
Nitrite: NEGATIVE
Protein, ur: NEGATIVE mg/dL
SPECIFIC GRAVITY, URINE: 1.015 (ref 1.005–1.030)
pH: 7 (ref 5.0–8.0)

## 2017-04-19 LAB — COMPREHENSIVE METABOLIC PANEL
ALBUMIN: 4.2 g/dL (ref 3.5–5.0)
ALT: 14 U/L (ref 14–54)
AST: 13 U/L — AB (ref 15–41)
Alkaline Phosphatase: 64 U/L (ref 38–126)
Anion gap: 10 (ref 5–15)
BILIRUBIN TOTAL: 0.9 mg/dL (ref 0.3–1.2)
BUN: 8 mg/dL (ref 6–20)
CHLORIDE: 107 mmol/L (ref 101–111)
CO2: 25 mmol/L (ref 22–32)
CREATININE: 0.83 mg/dL (ref 0.44–1.00)
Calcium: 9.2 mg/dL (ref 8.9–10.3)
GFR calc Af Amer: >60 mL/min (ref >60)
GLUCOSE: 124 mg/dL — AB (ref 65–99)
Potassium: 3.3 mmol/L — ABNORMAL LOW (ref 3.5–5.1)
Sodium: 142 mmol/L (ref 135–145)
Total Protein: 7.8 g/dL (ref 6.5–8.1)

## 2017-04-19 NOTE — ED Notes (Signed)
Pt called for room, no response from lobby 

## 2017-04-19 NOTE — ED Notes (Signed)
Pt not in lobby for recheck v/s

## 2017-04-19 NOTE — ED Triage Notes (Signed)
Pt c/o RLQ pains since yesterday. Denies n/v/d or urinary problems.

## 2017-04-19 NOTE — Telephone Encounter (Signed)
Pt having complaints of sharp constant abdominal pain in the right lower quadrant since yesterday and is currently rating pain 8 on a 1-10 scale.Pt is stating she also has a history of back pain but now feels a different type of pain in her back as well.  Pt states pain gets worse with movement. Pt states she has been able to eat and drink without any problems. Last BM was this morning and was a small amount. Pt states she did note that last week her BMs were black in color. Pt states she has taken Pepto Bismol, Tylenol 500mg (3 tabs- pt advised to only take 2 at a time every 8 hours) and some medication she was prescribed for back pain with no relief. Pt is now at work and does not get off until 3pm. Pt states the pain is not causing her to not be able to function and wouldn't describe it as excruciating. Pt advised to seek care in the ED/ Urgent Care. Pt verbalized understanding.  Reason for Disposition . [1] MILD-MODERATE pain AND [2] constant AND [3] present > 2 hours  Answer Assessment - Initial Assessment Questions 1. LOCATION: "Where does it hurt?"      Right lower abdomen 2. RADIATION: "Does the pain shoot anywhere else?" (e.g., chest, back)     Feels like it is going to lower back, feels a different type of pain 3. ONSET: "When did the pain begin?" (e.g., minutes, hours or days ago)      Yesterday about 14:30pm 4. SUDDEN: "Gradual or sudden onset?"     sudden 5. PATTERN "Does the pain come and go, or is it constant?"    - If constant: "Is it getting better, staying the same, or worsening?"      (Note: Constant means the pain never goes away completely; most serious pain is constant and it progresses)     - If intermittent: "How long does it last?" "Do you have pain now?"     (Note: Intermittent means the pain goes away completely between bouts)     constant 6. SEVERITY: "How bad is the pain?"  (e.g., Scale 1-10; mild, moderate, or severe)   - MILD (1-3): doesn't interfere with normal  activities, abdomen soft and not tender to touch    - MODERATE (4-7): interferes with normal activities or awakens from sleep, tender to touch    - SEVERE (8-10): excruciating pain, doubled over, unable to do any normal activities      Pain 8 7. RECURRENT SYMPTOM: "Have you ever had this type of abdominal pain before?" If so, ask: "When was the last time?" and "What happened that time?"      No 8. CAUSE: "What do you think is causing the abdominal pain?"     Not sure 9. RELIEVING/AGGRAVATING FACTORS: "What makes it better or worse?" (e.g., movement, antacids, bowel movement)     Pepto bismol, tylenol, and back pain prescribed 10. OTHER SYMPTOMS: "Has there been any vomiting, diarrhea, constipation, or urine problems?"       Constipation- feels like she needs to do something but can't, Last BM this morning but it wasn't  alot 11. PREGNANCY: "Is there any chance you are pregnant?" "When was your last menstrual period?"       No  Protocols used: ABDOMINAL PAIN - [facility]

## 2017-04-22 NOTE — Telephone Encounter (Signed)
Patient seen in ED as advised.

## 2017-05-21 ENCOUNTER — Other Ambulatory Visit: Payer: Self-pay | Admitting: Family Medicine

## 2017-05-24 ENCOUNTER — Encounter: Payer: Self-pay | Admitting: Gastroenterology

## 2017-05-25 ENCOUNTER — Other Ambulatory Visit: Payer: Self-pay | Admitting: Family Medicine

## 2017-06-13 ENCOUNTER — Telehealth: Payer: Self-pay | Admitting: Family Medicine

## 2017-06-13 MED ORDER — LOSARTAN POTASSIUM 100 MG PO TABS: 100.0000 mg | ORAL_TABLET | Freq: Every day | ORAL | 0 refills | Status: DC

## 2017-06-13 MED ORDER — AMLODIPINE BESYLATE 5 MG PO TABS: 7.5000 mg | ORAL_TABLET | Freq: Every day | ORAL | 0 refills | Status: DC

## 2017-06-13 NOTE — Telephone Encounter (Signed)
I have open scheduling, so I am not sure even why this is a question? But, Yes, totally fine to do CPE wherever works on my schedule for her as long as block 30 minutes - we even have 4:30 or 445 on thursdays so she could come after work. Also, a lot of my working patients come on a Thursday at 7 or 730 on tuesdays or thursdays. I hope that helps!

## 2017-06-13 NOTE — Telephone Encounter (Signed)
I called the pt and scheduled an appt for 7/11 at 4:45pm and she is aware refills were sent to her pharmacy.

## 2017-06-13 NOTE — Telephone Encounter (Signed)
Copied from [place] [phone]. Topic: Appointment Scheduling - Scheduling Inquiry for Clinic >> Jun 13, 2017  7:48 AM Scherrie Gerlach wrote: Reason for CRM: pt needs a cpe (last 10/2015) and needs her bp meds too.  Pt wants to know if she can schedule a 4 pm cpe one day in July. Pt only has Wed and Fridays available to be off work, and Dr Maudie Mercury is out those days!! Please advise if ok for pt to schedule. Thanks Ok to leave message for call back

## 2017-07-11 ENCOUNTER — Ambulatory Visit (INDEPENDENT_AMBULATORY_CARE_PROVIDER_SITE_OTHER): Payer: 59 | Admitting: Family Medicine

## 2017-07-11 ENCOUNTER — Encounter: Payer: Self-pay | Admitting: Family Medicine

## 2017-07-11 VITALS — BP 118/82 | HR 68 | Temp 98.5°F | Ht 69.75 in | Wt 282.5 lb

## 2017-07-11 DIAGNOSIS — R079 Chest pain, unspecified: Secondary | ICD-10-CM

## 2017-07-11 DIAGNOSIS — I10 Essential (primary) hypertension: Secondary | ICD-10-CM | POA: Diagnosis not present

## 2017-07-11 DIAGNOSIS — F330 Major depressive disorder, recurrent, mild: Secondary | ICD-10-CM | POA: Diagnosis not present

## 2017-07-11 NOTE — Patient Instructions (Addendum)
BEFORE YOU LEAVE: -EKG -PHQ9 -follow up: CPE in 1 month  -We placed a referral for you as discussed for the stress test. It usually takes about 1-2 weeks to process and schedule this referral. If you have not heard from [place] regarding this appointment in 2 weeks please contact our office.  -please get counseling for the stress  -We have ordered labs or studies at this visit. It can take up to 1-2 weeks for results and processing. IF results require follow up or explanation, we will call you with instructions. Clinically stable results will be released to your [facility]. If you have not heard from [place] or cannot find your results in [facility] in 2 weeks please contact our office at [phone].  If you are not yet signed up for [facility], please consider signing up.  -heat and can try aleve as needed per instructions for the chest wall pain  We have ordered labs or studies at this visit. It can take up to 1-2 weeks for results and processing. IF results require follow up or explanation, we will call you with instructions. Clinically stable results will be released to your [facility]. If you have not heard from [place] or cannot find your results in [facility] in 2 weeks please contact our office at [phone].  If you are not yet signed up for [facility], please consider signing up.  I hope you are feeling better soon! Seek care promptly if your symptoms worsen, new concerns arise or you are not improving with treatment.
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
## 2017-07-12 DIAGNOSIS — M48061 Spinal stenosis, lumbar region without neurogenic claudication: Secondary | ICD-10-CM | POA: Diagnosis not present

## 2017-07-12 LAB — BASIC METABOLIC PANEL
BUN: 10 mg/dL (ref 6–23)
CALCIUM: 9.6 mg/dL (ref 8.4–10.5)
CO2: 28 mEq/L (ref 19–32)
CREATININE: 0.85 mg/dL (ref 0.40–1.20)
Chloride: 104 mEq/L (ref 96–112)
GFR: 89.13 mL/min (ref >60.00)
GLUCOSE: 83 mg/dL (ref 70–99)
Potassium: 3.7 mEq/L (ref 3.5–5.1)
Sodium: 142 mEq/L (ref 135–145)

## 2017-07-12 LAB — CHOLESTEROL, TOTAL: Cholesterol: 200 mg/dL (ref 0–200)

## 2017-07-12 LAB — HEMOGLOBIN A1C: Hgb A1c MFr Bld: 6 % (ref 4.6–6.5)

## 2017-07-12 LAB — HDL CHOLESTEROL: HDL: 52.4 mg/dL (ref >39.00)

## 2017-07-12 LAB — CBC
HEMATOCRIT: 38.2 % (ref 36.0–46.0)
HEMOGLOBIN: 12.9 g/dL (ref 12.0–15.0)
MCHC: 33.7 g/dL (ref 30.0–36.0)
MCV: 88.4 fl (ref 78.0–100.0)
PLATELETS: 288 10*3/uL (ref 150.0–400.0)
RBC: 4.33 Mil/uL (ref 3.87–5.11)
RDW: 15.6 % — ABNORMAL HIGH (ref 11.5–15.5)
WBC: 4.8 10*3/uL (ref 4.0–10.5)

## 2017-08-15 ENCOUNTER — Encounter: Payer: 59 | Admitting: Family Medicine

## 2017-08-25 ENCOUNTER — Other Ambulatory Visit: Payer: Self-pay | Admitting: Family Medicine

## 2017-09-09 ENCOUNTER — Encounter: Payer: Self-pay | Admitting: Cardiology

## 2017-09-19 ENCOUNTER — Encounter (INDEPENDENT_AMBULATORY_CARE_PROVIDER_SITE_OTHER): Payer: Self-pay

## 2017-09-24 ENCOUNTER — Other Ambulatory Visit: Payer: Self-pay | Admitting: Family Medicine

## 2017-09-26 ENCOUNTER — Other Ambulatory Visit: Payer: Self-pay | Admitting: Obstetrics and Gynecology

## 2017-09-26 ENCOUNTER — Encounter: Payer: Self-pay | Admitting: Family Medicine

## 2017-09-26 ENCOUNTER — Ambulatory Visit (INDEPENDENT_AMBULATORY_CARE_PROVIDER_SITE_OTHER): Payer: 59 | Admitting: Family Medicine

## 2017-09-26 VITALS — BP 122/84 | HR 76 | Temp 98.7°F | Ht 69.5 in | Wt 283.3 lb

## 2017-09-26 DIAGNOSIS — Z23 Encounter for immunization: Secondary | ICD-10-CM

## 2017-09-26 DIAGNOSIS — Z0000 Encounter for general adult medical examination without abnormal findings: Secondary | ICD-10-CM | POA: Diagnosis not present

## 2017-09-26 DIAGNOSIS — Z1231 Encounter for screening mammogram for malignant neoplasm of breast: Secondary | ICD-10-CM

## 2017-09-26 NOTE — Patient Instructions (Addendum)
BEFORE YOU LEAVE: -flu shot -number to call GI and schedule -follow up: 3-4 months  See your gastroenterologist for your colonoscopy.  Check with your insurance regarding the new shingles vaccine.  See your gynecologist yearly.   Preventive Care 40-64 Years, Female Preventive care refers to lifestyle choices and visits with your health care provider that can promote health and wellness. What does preventive care include?  A yearly physical exam. This is also called an annual well check.  Dental exams once or twice a year.  Routine eye exams. Ask your health care provider how often you should have your eyes checked.  Personal lifestyle choices, including: ? Daily care of your teeth and gums. ? Regular physical activity. ? Eating a healthy diet. ? Avoiding tobacco and drug use. ? Limiting alcohol use. ? Practicing safe sex. ? Taking vitamin and mineral supplements as recommended by your health care provider. What happens during an annual well check? The services and screenings done by your health care provider during your annual well check will depend on your age, overall health, lifestyle risk factors, and family history of disease. Counseling Your health care provider may ask you questions about your:  Alcohol use.  Tobacco use.  Drug use.  Emotional well-being.  Home and relationship well-being.  Sexual activity.  Eating habits.  Work and work Statistician.  Method of birth control.  Menstrual cycle.  Pregnancy history.  Screening You may have the following tests or measurements:  Height, weight, and BMI.  Blood pressure.  Lipid and cholesterol levels. These may be checked every 5 years, or more frequently if you are over 79 years old.  Skin check.  Lung cancer screening. You may have this screening every year starting at age 86 if you have a 30-pack-year history of smoking and currently smoke or have quit within the past 15 years.  Fecal occult  blood test (FOBT) of the stool. You may have this test every year starting at age 73.  Flexible sigmoidoscopy or colonoscopy. You may have a sigmoidoscopy every 5 years or a colonoscopy every 10 years starting at age 19.  Hepatitis C blood test.  Hepatitis B blood test.  Sexually transmitted disease (STD) testing.  Diabetes screening. This is done by checking your blood sugar (glucose) after you have not eaten for a while (fasting). You may have this done every 1-3 years.  Mammogram. This may be done every 1-2 years. Talk to your health care provider about when you should start having regular mammograms. This may depend on whether you have a family history of breast cancer.  BRCA-related cancer screening. This may be done if you have a family history of breast, ovarian, tubal, or peritoneal cancers.  Pelvic exam and Pap test. This may be done every 3 years starting at age 52. Starting at age 63, this may be done every 5 years if you have a Pap test in combination with an HPV test.  Bone density scan. This is done to screen for osteoporosis. You may have this scan if you are at high risk for osteoporosis.  Discuss your test results, treatment options, and if necessary, the need for more tests with your health care provider. Vaccines Your health care provider may recommend certain vaccines, such as:  Influenza vaccine. This is recommended every year.  Tetanus, diphtheria, and acellular pertussis (Tdap, Td) vaccine. You may need a Td booster every 10 years.  Varicella vaccine. You may need this if you have not been vaccinated.  Zoster vaccine. You may need this after age 4.  Measles, mumps, and rubella (MMR) vaccine. You may need at least one dose of MMR if you were born in 1957 or later. You may also need a second dose.  Pneumococcal 13-valent conjugate (PCV13) vaccine. You may need this if you have certain conditions and were not previously vaccinated.  Pneumococcal polysaccharide  (PPSV23) vaccine. You may need one or two doses if you smoke cigarettes or if you have certain conditions.  Meningococcal vaccine. You may need this if you have certain conditions.  Hepatitis A vaccine. You may need this if you have certain conditions or if you travel or work in places where you may be exposed to hepatitis A.  Hepatitis B vaccine. You may need this if you have certain conditions or if you travel or work in places where you may be exposed to hepatitis B.  Haemophilus influenzae type b (Hib) vaccine. You may need this if you have certain conditions.  Talk to your health care provider about which screenings and vaccines you need and how often you need them. This information is not intended to replace advice given to you by your health care provider. Make sure you discuss any questions you have with your health care provider. Document Released: 01/14/2015 Document Revised: 09/07/2015 Document Reviewed: 10/19/2014 Elsevier Interactive Patient Education  [employer].

## 2017-09-26 NOTE — Progress Notes (Signed)
HPI:  Using dictation device. Unfortunately this device frequently misinterprets words/phrases.  Here for CPE:  -Concerns and/or follow up today:   Chronic medical problems summarized below were reviewed for changes.  She has a past medical history of morbid obesity, hyperlipidemia, GERD, hypertension, migraines, and chest pain, stress mild depression.  Reports her mood issues have resolved.  Reports she is in the process of scheduling stress test, but her chest pain has resolved.  She is walking on a regular basis and now is set up with the [facility] weight management program for help with the diet.  She is seeing Dr. Rolena Infante about her back.  -Diet: variety of foods, balance and well rounded, larger portion sizes -Exercise: no regular exercise -Taking folic acid, vitamin D or calcium: no -Diabetes and Dyslipidemia Screening: Done recently, discussed -Vaccines: see vaccine section EPIC -pap history: Sees GYN, reports up-to-date -FDLMP: see nursing notes -sexual activity: yes, female partner, no new partners -wants STI testing (Hep C if born [birth year]-65): no -FH breast, colon or ovarian ca: see FH Last mammogram: Sees GYN Last colon cancer screening: Past due for repeat colonoscopy, she was not aware of this, she also was not aware of the history of the polyps -explained the last colonoscopy report and path results and advised her to contact her GI office to schedule her colonoscopy.  She agreed to do so.  My assistant will give her the information to contact their office.  She saw Dr. Fuller Plan. Breast Ca Risk Assessment: see family history and pt history DEXA (>/= 64): Sees GYN, not applicable  -Alcohol, Tobacco, drug use: see social history  Review of Systems - no fevers, unintentional weight loss, vision loss, hearing loss, chest pain, sob, hemoptysis, melena, hematochezia, hematuria, genital discharge, changing or concerning skin lesions, bleeding, bruising, loc, thoughts of self harm or SI  Past  Medical History:  Diagnosis Date  . Allergy   . Arthritis    in back  . Chest pain    neg cath 2016 after false positive myoview  . GERD (gastroesophageal reflux disease)   . Hyperlipidemia   . Hypertension   . Migraine   . Mitral valve prolapse   . Muscle cramps     Past Surgical History:  Procedure Laterality Date  . BACK SURGERY    . CESAREAN SECTION    . LEFT HEART CATHETERIZATION WITH CORONARY ANGIOGRAM N/A 07/08/2013   Procedure: LEFT HEART CATHETERIZATION WITH CORONARY ANGIOGRAM;  Surgeon: Josue Hector, MD;  Location: [facility] CATH LAB;  Service: Cardiovascular;  Laterality: N/A;  . TUBAL LIGATION      Family History  Problem Relation Age of Onset  . Hyperlipidemia Mother   . Hypertension Mother   . Stroke Father   . Hyperlipidemia Brother        x2  . Hypertension Brother        x2  . Liver cancer Maternal Aunt     Social History   Socioeconomic History  . Marital status: Married    Spouse name: Not on file  . Number of children: 1  . Years of education: Not on file  . Highest education level: Not on file  Occupational History    Employer: [employer]  . Financial resource strain: Not on file  . Food insecurity:    Worry: Not on file    Inability: Not on file  . Transportation needs:    Medical: Not on file    Non-medical: Not on  file  Tobacco Use  . Smoking status: Former Smoker    Types: Cigarettes    Last attempt to quit: 07/31/1996    Years since quitting: 21.1  . Smokeless tobacco: Never Used  Substance and Sexual Activity  . Alcohol use: Yes    Comment: very little  . Drug use: No  . Sexual activity: Not on file  Lifestyle  . Physical activity:    Days per week: Not on file    Minutes per session: Not on file  . Stress: Not on file  Relationships  . Social connections:    Talks on phone: Not on file    Gets together: Not on file    Attends religious service: Not on file    Active member of club or organization:  Not on file    Attends meetings of clubs or organizations: Not on file    Relationship status: Not on file  Other Topics Concern  . Not on file  Social History Narrative  . Not on file     Current Outpatient Medications:  .  acetaminophen (TYLENOL) 500 MG tablet, Take 1,500 mg by mouth at bedtime., Disp: , Rfl:  .  amLODipine (NORVASC) 5 MG tablet, TAKE 1.5 TABLETS (7.5 MG TOTAL) BY MOUTH DAILY., Disp: 135 tablet, Rfl: 0 .  diclofenac sodium (VOLTAREN) 1 % GEL, Apply 4 g topically 2 (two) times daily as needed., Disp: 112 g, Rfl: 1 .  gabapentin (NEURONTIN) 300 MG capsule, Neurontin 300 mg capsule  Take 1 capsule every day by oral route.  take 1 tab at bedtime, Disp: , Rfl:  .  hydrochlorothiazide (HYDRODIURIL) 25 MG tablet, TAKE 1 TABLET BY MOUTH EVERY DAY, Disp: 90 tablet, Rfl: 1 .  Ibuprofen-Famotidine (DUEXIS) 800-26.6 MG TABS, Take by mouth daily., Disp: , Rfl:  .  losartan (COZAAR) 100 MG tablet, TAKE 1 TABLET BY MOUTH EVERY DAY, Disp: 90 tablet, Rfl: 0 .  pravastatin (PRAVACHOL) 40 MG tablet, TAKE 1 TABLET (40 MG TOTAL) BY MOUTH DAILY., Disp: 90 tablet, Rfl: 3 .  tiZANidine (ZANAFLEX) 4 MG tablet, TAKE 1 TABLET (4 MG TOTAL) BY MOUTH AT BEDTIME AS NEEDED FOR MUSCLE SPASMS., Disp: 15 tablet, Rfl: 0  EXAM:  Vitals:   09/26/17 1452  BP: 122/84  Pulse: 76  Temp: 98.7 F (37.1 C)  SpO2: 97%    GENERAL: vitals reviewed and listed below, alert, oriented, appears well hydrated and in no acute distress  HEENT: head atraumatic, PERRLA, normal appearance of eyes, ears, nose and mouth. moist mucus membranes.  NECK: supple, no masses or lymphadenopathy  LUNGS: clear to auscultation bilaterally, no rales, rhonchi or wheeze  CV: HRRR, no peripheral edema or cyanosis, normal pedal pulses  ABDOMEN: bowel sounds normal, soft, non tender to palpation, no masses, no rebound or guarding  GU/BREAST: Sees GYN  SKIN: no rash or abnormal lesions  MS: normal gait, moves all extremities  normally  NEURO: normal gait, speech and thought processing grossly intact, muscle tone grossly intact throughout  PSYCH: normal affect, pleasant and cooperative  ASSESSMENT AND PLAN:  Discussed the following assessment and plan:  PREVENTIVE EXAM: -Discussed and advised all [place] preventive services health task force level A and B recommendations for age, sex and risks. -Advised at least 150 minutes of exercise per week and a healthy diet with avoidance of (less then 1 serving per week) processed foods, white starches, red meat, fast foods and sweets and consisting of: * 5-9 servings of fresh fruits and vegetables (  not corn or potatoes) *nuts and seeds, beans *olives and olive oil *lean meats such as fish and white chicken  *whole grains -labs, studies and vaccines per orders this encounter - Flu Vaccine QUAD 6+ mos PF IM (Fluarix Quad PF)   Patient advised to return to clinic immediately if symptoms worsen or persist or new concerns.  There are no Patient Instructions on file for this visit.  No follow-ups on file.  Lucretia Kern, DO

## 2017-09-30 ENCOUNTER — Encounter: Payer: Self-pay | Admitting: Gastroenterology

## 2017-10-01 ENCOUNTER — Ambulatory Visit (INDEPENDENT_AMBULATORY_CARE_PROVIDER_SITE_OTHER): Payer: 59 | Admitting: Family Medicine

## 2017-10-01 ENCOUNTER — Encounter (INDEPENDENT_AMBULATORY_CARE_PROVIDER_SITE_OTHER): Payer: Self-pay | Admitting: Family Medicine

## 2017-10-01 VITALS — BP 142/83 | HR 57 | Temp 97.6°F | Ht 70.0 in | Wt 273.0 lb

## 2017-10-01 DIAGNOSIS — Z1331 Encounter for screening for depression: Secondary | ICD-10-CM

## 2017-10-01 DIAGNOSIS — R0602 Shortness of breath: Secondary | ICD-10-CM

## 2017-10-01 DIAGNOSIS — I10 Essential (primary) hypertension: Secondary | ICD-10-CM

## 2017-10-01 DIAGNOSIS — Z6839 Body mass index (BMI) 39.0-39.9, adult: Secondary | ICD-10-CM

## 2017-10-01 DIAGNOSIS — R5383 Other fatigue: Secondary | ICD-10-CM | POA: Diagnosis not present

## 2017-10-01 DIAGNOSIS — Z9189 Other specified personal risk factors, not elsewhere classified: Secondary | ICD-10-CM | POA: Diagnosis not present

## 2017-10-01 DIAGNOSIS — Z0289 Encounter for other administrative examinations: Secondary | ICD-10-CM

## 2017-10-01 DIAGNOSIS — R7303 Prediabetes: Secondary | ICD-10-CM

## 2017-10-01 NOTE — Progress Notes (Signed)
Office: [phone]  /  Fax: [phone]   Dear Dr. Rolena [name],   Thank you for referring Jamie [name] to our clinic. The following note includes my evaluation and treatment recommendations.  HPI:   Chief Complaint: OBESITY    Jamie [name] has been referred by Jamie [name]. Jamie Infante, MD for consultation regarding her obesity and obesity related comorbidities.    Jamie [name] (MR# [id number]) is a 57 y.o. female who presents on 10/01/2017 for obesity evaluation and treatment. Current BMI is Body mass index is 39.17 kg/m.Marland Kitchen Jamie [name] has been struggling with her weight for many years and has been unsuccessful in either losing weight, maintaining weight loss, or reaching her healthy weight goal.     Jamie [name] attended our information session and states she is currently in the action stage of change and ready to dedicate time achieving and maintaining a healthier weight. Jamie [name] is interested in becoming our Jamie [name] and working on intensive lifestyle modifications including (but not limited to) diet, exercise and weight loss.    Jamie [name] states her family eats meals together she thinks her family will eat healthier with  her her desired weight loss is 83 lbs she started gaining weight after having a baby her heaviest weight ever was 285 lbs. she has significant food cravings issues  she skips meals frequently she is frequently drinking liquids with calories she has binge eating behaviors   Jamie [name] feels her energy is lower than it should be. This has worsened with weight gain and has not worsened recently. Jamie [name] admits to daytime somnolence and admits to waking up still tired. Jamie [name] is at risk for obstructive sleep apnea. Patent has a history of symptoms of daytime Jamie, morning Jamie, morning headache and hypertension. Jamie [name] generally gets 3 or 4 hours of sleep per night, and states they generally have restless sleep. Snoring is present. Apneic episodes are not present. Epworth Sleepiness Score  is 9  EKG was ordered today and shows normal sinus rhythm.  Dyspnea on exertion Jamie [name] notes increasing shortness of breath with exercising and seems to be worsening over time with weight gain. She notes getting out of breath sooner with activity than she used to. This has not gotten worse recently. EKG was ordered today and shows normal sinus rhythm. Jamie [name] denies orthopnea.  Hypertension Jamie [name] is a 57 y.o. female with hypertension. Her blood pressure is slightly elevated, but she didn't take her medications. She has been on medications for years. Jamie [name] denies chest pain, chest pressure or headache. She is attempting to work on weight loss to help control her blood pressure with the goal of decreasing her risk of heart attack and stroke. Jamie [name] blood pressure is currently controlled.  Pre-Diabetes Jamie [name] has a diagnosis of pre-diabetes based on her last Hgb A1c of 6.0 and she was informed this puts her at greater risk of developing diabetes. Jamie [name] has had the diagnosis of pre-diabetes since at least 2013. She is not taking metformin currently. Jamie [name] is attempting to work on diet and exercise to decrease risk of diabetes. She denies nausea or hypoglycemia.  At risk for diabetes Jamie [name] is at higher than average risk for developing diabetes due to her obesity and prediabetes. She currently denies polyuria or polydipsia.  Depression Screen Jamie [name]'s Food and Mood (modified PHQ-9) score was  Depression screen PHQ 2/9 10/01/2017  Decreased Interest 1  Down, Depressed, Hopeless 1  PHQ - 2 Score 2  Altered sleeping 1  Tired, decreased energy 1  Change in appetite 0  Feeling bad or failure about yourself  0  Trouble concentrating 1  Moving slowly or fidgety/restless 0  Suicidal thoughts 0  PHQ-9 Score 5  Difficult doing work/chores Not difficult at all    ALLERGIES: No Known Allergies  MEDICATIONS: Current Outpatient Medications on File Prior to Visit  Medication Sig Dispense  Refill  . acetaminophen (TYLENOL) 500 MG tablet Take 1,500 mg by mouth at bedtime.    Marland Kitchen amLODipine (NORVASC) 5 MG tablet TAKE 1.5 TABLETS (7.5 MG TOTAL) BY MOUTH DAILY. 135 tablet 0  . gabapentin (NEURONTIN) 300 MG capsule Neurontin 300 mg capsule  Take 1 capsule every day by oral route.  take 1 tab at bedtime    . GLUCOSAMINE-CHONDROITIN PO Take 1 tablet by mouth daily.    Marland Kitchen guaiFENesin (MUCINEX) 600 MG 12 hr tablet Take by mouth 2 (two) times daily.    . hydrochlorothiazide (HYDRODIURIL) 25 MG tablet TAKE 1 TABLET BY MOUTH EVERY DAY 90 tablet 1  . Ibuprofen-Famotidine (DUEXIS) 800-26.6 MG TABS Take by mouth daily.    Marland Kitchen losartan (COZAAR) 100 MG tablet TAKE 1 TABLET BY MOUTH EVERY DAY 90 tablet 0  . Potassium 99 MG TABS Take 1 tablet by mouth daily.    . ranitidine (ZANTAC) 150 MG tablet Take 150 mg by mouth 2 (two) times daily.    . valACYclovir (VALTREX) 500 MG tablet Take 500 mg by mouth 2 (two) times daily.    . pravastatin (PRAVACHOL) 40 MG tablet TAKE 1 TABLET (40 MG TOTAL) BY MOUTH DAILY. 90 tablet 3  . tiZANidine (ZANAFLEX) 4 MG tablet TAKE 1 TABLET (4 MG TOTAL) BY MOUTH AT BEDTIME AS NEEDED FOR MUSCLE SPASMS. 15 tablet 0   No current facility-administered medications on file prior to visit.     PAST MEDICAL HISTORY: Past Medical History:  Diagnosis Date  . Allergy   . Arthritis    in back  . Chest pain    neg cath 2016 after false positive myoview  . Chest pain   . Depression   . GERD (gastroesophageal reflux disease)   . High blood pressure   . Hyperlipidemia   . Hypertension   . Joint pain   . Knee pain   . Low back pain   . Migraine   . Mitral valve prolapse   . Muscle cramps   . Prediabetes     PAST SURGICAL HISTORY: Past Surgical History:  Procedure Laterality Date  . BACK SURGERY    . CESAREAN SECTION    . LEFT HEART CATHETERIZATION WITH CORONARY ANGIOGRAM N/A 07/08/2013   Procedure: LEFT HEART CATHETERIZATION WITH CORONARY ANGIOGRAM;  Surgeon: Josue Hector, MD;  Location[facility] CATH LAB;  Service: Cardiovascular;  Laterality: N/A;  . TUBAL LIGATION      SOCIAL HISTORY: Social History   Tobacco Use  . Smoking status: Former Smoker    Types: Cigarettes    Last attempt to quit: 07/31/1996    Years since quitting: 21.1  . Smokeless tobacco: Never Used  Substance Use Topics  . Alcohol use: Yes    Comment: very little  . Drug use: No    FAMILY HISTORY: Family History  Problem Relation Age of Onset  . Hyperlipidemia Mother   . Hypertension Mother   . AAA (abdominal aortic aneurysm) Mother   . Stroke Father   . Hypertension Father   . Hyperlipidemia Brother        x2  . Hypertension Brother  x2  . Liver cancer Maternal Aunt     ROS: Review of Systems  Constitutional: Positive for malaise/Jamie.  HENT: Positive for congestion (nasal stuffiness).        + Dry Mouth + Hoarseness  Eyes:       + Wear Glasses or Contacts + Blurry or Double Vision + Floaters  Respiratory: Positive for shortness of breath (on exertion).   Cardiovascular: Negative for chest pain and orthopnea.       + Leg Cramping Negative for chest pressure  Gastrointestinal: Positive for heartburn. Negative for nausea.  Genitourinary: Positive for frequency.  Musculoskeletal: Positive for back pain.       + Neck Stiffness + Muscle or Joint Pain + Muscle Stiffness + Red or Swollen Joints  Neurological: Positive for dizziness and headaches.  Endo/Heme/Allergies: Negative for polydipsia.       Negative for hypoglycemia  Psychiatric/Behavioral: Positive for depression. The Jamie [name] has insomnia.        + Stress    PHYSICAL EXAM: Blood pressure (!) 142/83, pulse (!) 57, temperature 97.6 F (36.4 C), temperature source Oral, height 5\' 10"  (1.778 m), weight 273 lb (123.8 kg), last menstrual period 10/04/2014, SpO2 98 %. Body mass index is 39.17 kg/m. Physical Exam  Constitutional: She is oriented to person, place, and time. She appears  well-developed and well-nourished.  HENT:  Head: Normocephalic and atraumatic.  Nose: Nose normal.  Eyes: EOM are normal. No scleral icterus.  Neck: Normal range of motion. Neck supple. No thyromegaly present.  Cardiovascular: Normal rate and regular rhythm.  Pulmonary/Chest: Effort normal. No respiratory distress.  Abdominal: Soft. There is no tenderness.  + Obesity  Musculoskeletal: Normal range of motion.  Range of Motion normal in all 4 extremities  Neurological: She is alert and oriented to person, place, and time. Coordination normal.  Skin: Skin is warm and dry.  Psychiatric: She has a normal mood and affect. Her behavior is normal.  Vitals reviewed.   RECENT LABS AND TESTS: BMET    Component Value Date/Time   NA 142 07/11/2017 1700   K 3.7 07/11/2017 1700   CL 104 07/11/2017 1700   CO2 28 07/11/2017 1700   GLUCOSE 83 07/11/2017 1700   BUN 10 07/11/2017 1700   CREATININE 0.85 07/11/2017 1700   CALCIUM 9.6 07/11/2017 1700   GFRNONAA >60 04/19/2017 1557   GFRAA >60 04/19/2017 1557   Lab Results  Component Value Date   HGBA1C 6.0 07/11/2017   No results found for: INSULIN CBC    Component Value Date/Time   WBC 4.8 07/11/2017 1700   RBC 4.33 07/11/2017 1700   HGB 12.9 07/11/2017 1700   HCT 38.2 07/11/2017 1700   PLT 288.0 07/11/2017 1700   MCV 88.4 07/11/2017 1700   MCH 29.2 04/19/2017 1557   MCHC 33.7 07/11/2017 1700   RDW 15.6 (H) 07/11/2017 1700   LYMPHSABS 2.4 01/17/2015 1421   MONOABS 0.4 01/17/2015 1421   EOSABS 0.2 01/17/2015 1421   BASOSABS 0.0 01/17/2015 1421   Iron/TIBC/Ferritin/ %Sat No results found for: IRON, TIBC, FERRITIN, IRONPCTSAT Lipid Panel     Component Value Date/Time   CHOL 200 07/11/2017 1700   TRIG 88.0 10/11/2015 0956   HDL 52.40 07/11/2017 1700   CHOLHDL 3 10/11/2015 0956   VLDL 17.6 10/11/2015 0956   LDLCALC 97 10/11/2015 0956   Hepatic Function Panel     Component Value Date/Time   PROT 7.8 04/19/2017 1557    ALBUMIN 4.2 04/19/2017 1557  AST 13 (L) 04/19/2017 1557   ALT 14 04/19/2017 1557   ALKPHOS 64 04/19/2017 1557   BILITOT 0.9 04/19/2017 1557   BILIDIR 0.1 08/01/2010 1004      Component Value Date/Time   TSH 2.01 01/17/2015 1421   TSH 1.42 08/01/2010 1004   TSH 2.53 01/13/2007 0908    ECG  shows NSR with a rate of 63 BPM INDIRECT CALORIMETER done today shows a VO2 of 216 and a REE of 1502.  Her calculated basal metabolic rate is 0350 thus her basal metabolic rate is worse than expected.    ASSESSMENT AND PLAN: Other Jamie - Plan: EKG 12-Lead, Lipid Panel With LDL/HDL Ratio, VITAMIN D 25 Hydroxy (Vit-D Deficiency, Fractures), Vitamin B12, Folate, T3, T4, free, TSH  Shortness of breath on exertion  Essential hypertension - Plan: Comprehensive metabolic panel  Prediabetes - Plan: Comprehensive metabolic panel, Insulin, random  Depression screening  At risk for diabetes mellitus  Class 2 severe obesity with serious comorbidity and body mass index (BMI) of 39.0 to 39.9 in adult, unspecified obesity type (HCC)  PLAN: Jamie Jamie [name] was informed that her Jamie may be related to obesity, depression or many other causes. Labs will be ordered, and in the meanwhile Mayzee has agreed to work on diet, exercise and weight loss to help with Jamie. Proper sleep hygiene was discussed including the need for 7-8 hours of quality sleep each night. A sleep study was not ordered based on symptoms and Epworth score. We will order indirect calorimetry, EKG and echocardiogram today.  Dyspnea on exertion Jamie [name]'s shortness of breath appears to be obesity related and exercise induced. She has agreed to work on weight loss and gradually increase exercise to treat her exercise induced shortness of breath. If Jamie [name] follows our instructions and loses weight without improvement of her shortness of breath, we will plan to refer to pulmonology. We will order indirect calorimetry, EKG, echocardiogram and  labs today. We will monitor this condition regularly. Jamie [name] agrees to this plan.  Hypertension We discussed sodium restriction, working on healthy weight loss, and a regular exercise program as the means to achieve improved blood pressure control. Jamie [name] agreed with this plan and agreed to follow up as directed. We will continue to monitor her blood pressure as well as her progress with the above lifestyle modifications. She will continue her medications as prescribed and will watch for signs of hypotension as she continues her lifestyle modifications. We will check CMP and fasting lipid panel today.  Pre-Diabetes Jamie [name] will work on weight loss, exercise, and decreasing simple carbohydrates in her diet to help decrease the risk of diabetes.She was informed that eating too many simple carbohydrates or too many calories at one sitting increases the likelihood of GI side effects. We will check Hgb A1c and insulin level today and Jamie [name] agreed to follow up with [place] as directed to monitor her progress.  Diabetes risk counseling Jamie [name] was given extended (15 minutes) diabetes prevention counseling today. She is 57 y.o. female and has risk factors for diabetes including obesity and prediabetes. We discussed intensive lifestyle modifications today with an emphasis on weight loss as well as increasing exercise and decreasing simple carbohydrates in her diet.  Depression Screen Zhavia had a mildly positive depression screening. Depression is commonly associated with obesity and often results in emotional eating behaviors. We will monitor this closely and work on CBT to help improve the non-hunger eating patterns. Referral to Psychology may be required if no improvement is seen as  she continues in our clinic.  Obesity Eiza is currently in the action stage of change and her goal is to continue with weight loss efforts. I recommend Makylie begin the structured treatment plan as follows:  She has agreed to follow the  Category 2 plan +100 calories Tamia has been instructed to eventually work up to a goal of 150 minutes of combined cardio and strengthening exercise per week for weight loss and overall health benefits. We discussed the following Behavioral Modification Strategies today: planning for success, increasing lean protein intake, increasing vegetables and work on meal planning and easy cooking plans   She was informed of the importance of frequent follow up visits to maximize her success with intensive lifestyle modifications for her multiple health conditions. She was informed we would discuss her lab results at her next visit unless there is a critical issue that needs to be addressed sooner. Camile agreed to keep her next visit at the agreed upon time to discuss these results.    OBESITY BEHAVIORAL INTERVENTION VISIT  Today's visit was # 1   Starting weight: 273 lbs Starting date: 10/01/17 Today's weight : 273 lbs Today's date: 10/01/2017 Total lbs lost to date: 0   ASK: We discussed the diagnosis of obesity with Jamie [name] today and Jamie [name] agreed to give [place] permission to discuss obesity behavioral modification therapy today.  ASSESS: Maryalice has the diagnosis of obesity and her BMI today is 39.17 Juel is in the action stage of change   ADVISE: Tamari was educated on the multiple health risks of obesity as well as the benefit of weight loss to improve her health. She was advised of the need for long term treatment and the importance of lifestyle modifications to improve her current health and to decrease her risk of future health problems.  AGREE: Multiple dietary modification options and treatment options were discussed and  Roneshia agreed to follow the recommendations documented in the above note.  ARRANGE: Pammie was educated on the importance of frequent visits to treat obesity as outlined per CMS and USPSTF guidelines and agreed to schedule her next follow up appointment today.  I,  Doreene Nest, am acting as transcriptionist for Eber Jones, MD   I have reviewed the above documentation for accuracy and completeness, and I agree with the above. - Ilene Qua, MD

## 2017-10-02 LAB — COMPREHENSIVE METABOLIC PANEL
A/G RATIO: 1.7 (ref 1.2–2.2)
ALBUMIN: 4.5 g/dL (ref 3.5–5.5)
ALK PHOS: 71 IU/L (ref 39–117)
ALT: 16 IU/L (ref 0–32)
AST: 12 IU/L (ref 0–40)
BUN / CREAT RATIO: 13 (ref 9–23)
BUN: 10 mg/dL (ref 6–24)
Bilirubin Total: 0.4 mg/dL (ref 0.0–1.2)
CO2: 24 mmol/L (ref 20–29)
CREATININE: 0.77 mg/dL (ref 0.57–1.00)
Calcium: 9.6 mg/dL (ref 8.7–10.2)
Chloride: 100 mmol/L (ref 96–106)
GFR calc Af Amer: 101 mL/min/{1.73_m2} (ref >59)
GFR calc non Af Amer: 87 mL/min/{1.73_m2} (ref >59)
GLOBULIN, TOTAL: 2.7 g/dL (ref 1.5–4.5)
Glucose: 81 mg/dL (ref 65–99)
POTASSIUM: 3.9 mmol/L (ref 3.5–5.2)
SODIUM: 142 mmol/L (ref 134–144)
Total Protein: 7.2 g/dL (ref 6.0–8.5)

## 2017-10-02 LAB — LIPID PANEL WITH LDL/HDL RATIO
CHOLESTEROL TOTAL: 193 mg/dL (ref 100–199)
HDL: 51 mg/dL (ref >39)
LDL CALC: 121 mg/dL — AB (ref 0–99)
LDL/HDL RATIO: 2.4 ratio (ref 0.0–3.2)
TRIGLYCERIDES: 107 mg/dL (ref 0–149)
VLDL CHOLESTEROL CAL: 21 mg/dL (ref 5–40)

## 2017-10-02 LAB — TSH: TSH: 1.61 u[IU]/mL (ref 0.450–4.500)

## 2017-10-02 LAB — T4, FREE: Free T4: 1.2 ng/dL (ref 0.82–1.77)

## 2017-10-02 LAB — FOLATE: Folate: 9.5 ng/mL (ref >3.0)

## 2017-10-02 LAB — INSULIN, RANDOM: INSULIN: 10.5 u[IU]/mL (ref 2.6–24.9)

## 2017-10-02 LAB — VITAMIN D 25 HYDROXY (VIT D DEFICIENCY, FRACTURES): Vit D, 25-Hydroxy: 10.9 ng/mL — ABNORMAL LOW (ref 30.0–100.0)

## 2017-10-02 LAB — VITAMIN B12: VITAMIN B 12: 414 pg/mL (ref 232–1245)

## 2017-10-02 LAB — T3: T3, Total: 122 ng/dL (ref 71–180)

## 2017-10-02 NOTE — Addendum Note (Signed)
Addended by: Wilfrid Lund on: 10/02/2017 03:47 PM   Modules accepted: Orders

## 2017-10-06 ENCOUNTER — Encounter (HOSPITAL_COMMUNITY): Payer: Self-pay

## 2017-10-06 ENCOUNTER — Other Ambulatory Visit: Payer: Self-pay

## 2017-10-06 ENCOUNTER — Observation Stay (HOSPITAL_COMMUNITY)
Admission: EM | Admit: 2017-10-06 | Discharge: 2017-10-07 | Disposition: A | Discharge status: Home / Self Care | Payer: 59 | Attending: Family Medicine | Admitting: Family Medicine

## 2017-10-06 ENCOUNTER — Emergency Department (HOSPITAL_COMMUNITY): Payer: 59

## 2017-10-06 DIAGNOSIS — M62838 Other muscle spasm: Secondary | ICD-10-CM | POA: Insufficient documentation

## 2017-10-06 DIAGNOSIS — R7303 Prediabetes: Secondary | ICD-10-CM | POA: Insufficient documentation

## 2017-10-06 DIAGNOSIS — Z87891 Personal history of nicotine dependence: Secondary | ICD-10-CM | POA: Insufficient documentation

## 2017-10-06 DIAGNOSIS — Z79899 Other long term (current) drug therapy: Secondary | ICD-10-CM | POA: Insufficient documentation

## 2017-10-06 DIAGNOSIS — Z8249 Family history of ischemic heart disease and other diseases of the circulatory system: Secondary | ICD-10-CM | POA: Insufficient documentation

## 2017-10-06 DIAGNOSIS — E785 Hyperlipidemia, unspecified: Secondary | ICD-10-CM | POA: Diagnosis not present

## 2017-10-06 DIAGNOSIS — Z6841 Body Mass Index (BMI) 40.0 and over, adult: Secondary | ICD-10-CM | POA: Insufficient documentation

## 2017-10-06 DIAGNOSIS — I341 Nonrheumatic mitral (valve) prolapse: Secondary | ICD-10-CM | POA: Insufficient documentation

## 2017-10-06 DIAGNOSIS — R55 Syncope and collapse: Secondary | ICD-10-CM | POA: Diagnosis not present

## 2017-10-06 DIAGNOSIS — E876 Hypokalemia: Secondary | ICD-10-CM | POA: Diagnosis not present

## 2017-10-06 DIAGNOSIS — K219 Gastro-esophageal reflux disease without esophagitis: Secondary | ICD-10-CM | POA: Diagnosis not present

## 2017-10-06 DIAGNOSIS — R0789 Other chest pain: Secondary | ICD-10-CM | POA: Diagnosis not present

## 2017-10-06 DIAGNOSIS — M1990 Unspecified osteoarthritis, unspecified site: Secondary | ICD-10-CM | POA: Insufficient documentation

## 2017-10-06 DIAGNOSIS — I10 Essential (primary) hypertension: Secondary | ICD-10-CM | POA: Insufficient documentation

## 2017-10-06 DIAGNOSIS — G8929 Other chronic pain: Secondary | ICD-10-CM | POA: Diagnosis not present

## 2017-10-06 DIAGNOSIS — I272 Pulmonary hypertension, unspecified: Secondary | ICD-10-CM | POA: Diagnosis not present

## 2017-10-06 DIAGNOSIS — N179 Acute kidney failure, unspecified: Secondary | ICD-10-CM | POA: Diagnosis not present

## 2017-10-06 DIAGNOSIS — G43909 Migraine, unspecified, not intractable, without status migrainosus: Secondary | ICD-10-CM | POA: Diagnosis not present

## 2017-10-06 DIAGNOSIS — R9431 Abnormal electrocardiogram [ECG] [EKG]: Secondary | ICD-10-CM

## 2017-10-06 DIAGNOSIS — M549 Dorsalgia, unspecified: Secondary | ICD-10-CM | POA: Insufficient documentation

## 2017-10-06 DIAGNOSIS — E669 Obesity, unspecified: Secondary | ICD-10-CM | POA: Insufficient documentation

## 2017-10-06 LAB — URINALYSIS, ROUTINE W REFLEX MICROSCOPIC
BACTERIA UA: NONE SEEN
BILIRUBIN URINE: NEGATIVE
Glucose, UA: NEGATIVE mg/dL
Hgb urine dipstick: NEGATIVE
Ketones, ur: NEGATIVE mg/dL
LEUKOCYTES UA: NEGATIVE
Nitrite: NEGATIVE
Protein, ur: NEGATIVE mg/dL
SPECIFIC GRAVITY, URINE: 1.013 (ref 1.005–1.030)
pH: 7 (ref 5.0–8.0)

## 2017-10-06 LAB — BASIC METABOLIC PANEL
Anion gap: 10 (ref 5–15)
BUN: 13 mg/dL (ref 6–20)
CHLORIDE: 109 mmol/L (ref 98–111)
CO2: 27 mmol/L (ref 22–32)
Calcium: 9.5 mg/dL (ref 8.9–10.3)
Creatinine, Ser: 1 mg/dL (ref 0.44–1.00)
GFR calc Af Amer: >60 mL/min (ref >60)
GFR calc non Af Amer: >60 mL/min (ref >60)
GLUCOSE: 97 mg/dL (ref 70–99)
Potassium: 3.5 mmol/L (ref 3.5–5.1)
Sodium: 146 mmol/L — ABNORMAL HIGH (ref 135–145)

## 2017-10-06 LAB — CBC
HCT: 39.6 % (ref 36.0–46.0)
Hemoglobin: 12.6 g/dL (ref 12.0–15.0)
MCH: 28.5 pg (ref 26.0–34.0)
MCHC: 31.8 g/dL (ref 30.0–36.0)
MCV: 89.6 fL (ref 78.0–100.0)
Platelets: 284 10*3/uL (ref 150–400)
RBC: 4.42 MIL/uL (ref 3.87–5.11)
RDW: 14.8 % (ref 11.5–15.5)
WBC: 5.4 10*3/uL (ref 4.0–10.5)

## 2017-10-06 LAB — D-DIMER, QUANTITATIVE: D-Dimer, Quant: 0.45 ug/mL-FEU (ref 0.00–0.50)

## 2017-10-06 LAB — CBG MONITORING, ED: Glucose-Capillary: 86 mg/dL (ref 70–99)

## 2017-10-06 LAB — I-STAT TROPONIN, ED: TROPONIN I, POC: 0.01 ng/mL (ref 0.00–0.08)

## 2017-10-06 MED ORDER — METOCLOPRAMIDE HCL 5 MG/ML IJ SOLN
10.0000 mg | Freq: Once | INTRAMUSCULAR | Status: AC
  Administered 2017-10-06: 10 mg via INTRAVENOUS
  Filled 2017-10-06: qty 2

## 2017-10-06 MED ORDER — ONDANSETRON HCL 4 MG PO TABS: 4.0000 mg | ORAL_TABLET | Freq: Once | ORAL | Status: DC

## 2017-10-06 MED ORDER — ENOXAPARIN SODIUM 40 MG/0.4ML ~~LOC~~ SOLN
40.0000 mg | SUBCUTANEOUS | Status: DC
  Administered 2017-10-07: 40 mg via SUBCUTANEOUS
  Filled 2017-10-06: qty 0.4

## 2017-10-06 MED ORDER — IBUPROFEN 200 MG PO TABS
600.0000 mg | ORAL_TABLET | Freq: Once | ORAL | Status: AC
  Administered 2017-10-06: 600 mg via ORAL
  Filled 2017-10-06: qty 3

## 2017-10-06 MED ORDER — LOSARTAN POTASSIUM 50 MG PO TABS
50.0000 mg | ORAL_TABLET | Freq: Every day | ORAL | Status: DC
  Administered 2017-10-07: 50 mg via ORAL
  Filled 2017-10-06: qty 1

## 2017-10-06 MED ORDER — TIZANIDINE HCL 4 MG PO TABS: 4.0000 mg | ORAL_TABLET | Freq: Every evening | ORAL | Status: DC | PRN

## 2017-10-06 MED ORDER — POTASSIUM CHLORIDE CRYS ER 20 MEQ PO TBCR
40.0000 meq | EXTENDED_RELEASE_TABLET | Freq: Once | ORAL | Status: AC
  Administered 2017-10-06: 40 meq via ORAL
  Filled 2017-10-06: qty 2

## 2017-10-06 MED ORDER — GABAPENTIN 300 MG PO CAPS
300.0000 mg | ORAL_CAPSULE | Freq: Every day | ORAL | Status: DC
  Administered 2017-10-06: 300 mg via ORAL
  Filled 2017-10-06: qty 1

## 2017-10-06 MED ORDER — ACETAMINOPHEN 500 MG PO TABS
1000.0000 mg | ORAL_TABLET | Freq: Once | ORAL | Status: AC
  Administered 2017-10-06: 1000 mg via ORAL
  Filled 2017-10-06: qty 2

## 2017-10-06 MED ORDER — SODIUM CHLORIDE 0.9 % IV BOLUS
1000.0000 mL | Freq: Once | INTRAVENOUS | Status: AC
  Administered 2017-10-06: 1000 mL via INTRAVENOUS

## 2017-10-06 MED ORDER — FAMOTIDINE 20 MG PO TABS
20.0000 mg | ORAL_TABLET | Freq: Once | ORAL | Status: AC
  Administered 2017-10-06: 20 mg via ORAL
  Filled 2017-10-06: qty 1

## 2017-10-06 MED ORDER — DIPHENHYDRAMINE HCL 50 MG/ML IJ SOLN
25.0000 mg | Freq: Once | INTRAMUSCULAR | Status: AC
  Administered 2017-10-06: 25 mg via INTRAVENOUS
  Filled 2017-10-06: qty 1

## 2017-10-06 NOTE — ED Notes (Signed)
ED TO INPATIENT HANDOFF REPORT  Name/Age/Gender Jamie [name] 57 y.o. female  Code Status    Code Status Orders  (From admission, onward)         Start     Ordered   10/06/17 2128  Full code  Continuous     10/06/17 2132        Code Status History    Date Active Date Inactive Code Status Order ID Comments User Context   07/08/2013 1038 07/08/2013 1724 Full Code [id number]  Josue Hector, MD Inpatient      Home/SNF/Other Home  Chief Complaint Lightheadness and Faint   Level of Care/Admitting Diagnosis ED Disposition    ED Disposition Condition [place]: [facility] [[id number]]  Level of Care: Telemetry [5]  Admit to tele based on following criteria: Eval of Syncope  Diagnosis: Syncope [597416]  Admitting Physician: Colbert Ewing [[id number]]  Attending Physician: Colbert Ewing [[id number]]  PT Class (Do Not Modify): Observation [104]  PT Acc Code (Do Not Modify): Observation [10022]       Medical History Past Medical History:  Diagnosis Date  . Allergy   . Arthritis    in back  . Chest pain    neg cath 2016 after false positive myoview  . Chest pain   . Depression   . GERD (gastroesophageal reflux disease)   . High blood pressure   . Hyperlipidemia   . Hypertension   . Joint pain   . Knee pain   . Low back pain   . Migraine   . Mitral valve prolapse   . Muscle cramps   . Prediabetes     Allergies No Known Allergies  IV Location/Drains/Wounds Patient Lines/Drains/Airways Status   Active Line/Drains/Airways    Name:   Placement date:   Placement time:   Site:   Days:   Peripheral IV 10/06/17 Right Antecubital   10/06/17    1740    Antecubital   less than 1          Labs/Imaging Results for orders placed or performed during the hospital encounter of 10/06/17 (from the past 48 hour(s))  CBG monitoring, ED     Status: None   Collection Time: 10/06/17  5:10 PM  Result Value Ref Range   Glucose-Capillary  86 70 - 99 mg/dL  Basic metabolic panel     Status: Abnormal   Collection Time: 10/06/17  5:40 PM  Result Value Ref Range   Sodium 146 (H) 135 - 145 mmol/L   Potassium 3.5 3.5 - 5.1 mmol/L   Chloride 109 98 - 111 mmol/L   CO2 27 22 - 32 mmol/L   Glucose, Bld 97 70 - 99 mg/dL   BUN 13 6 - 20 mg/dL   Creatinine, Ser 1.00 0.44 - 1.00 mg/dL   Calcium 9.5 8.9 - 10.3 mg/dL   GFR calc non Af Amer >60 >60 mL/min   GFR calc Af Amer >60 >60 mL/min    Comment: (NOTE) The eGFR has been calculated using the CKD EPI equation. This calculation has not been validated in all clinical situations. eGFR's persistently <60 mL/min signify possible Chronic Kidney Disease.    Anion gap 10 5 - 15    Comment: Performed at [facility], [place] [address], [place],  [place] [address]  CBC     Status: None   Collection Time: 10/06/17  5:40 PM  Result Value Ref Range   WBC  5.4 4.0 - 10.5 K/uL   RBC 4.42 3.87 - 5.11 MIL/uL   Hemoglobin 12.6 12.0 - 15.0 g/dL   HCT 39.6 36.0 - 46.0 %   MCV 89.6 78.0 - 100.0 fL   MCH 28.5 26.0 - 34.0 pg   MCHC 31.8 30.0 - 36.0 g/dL   RDW 14.8 11.5 - 15.5 %   Platelets 284 150 - 400 K/uL    Comment: Performed at [facility], [place] [address], [place], [place] [address]  Urinalysis, Routine w reflex microscopic     Status: None   Collection Time: 10/06/17  6:37 PM  Result Value Ref Range   Color, Urine YELLOW YELLOW   APPearance CLEAR CLEAR   Specific Gravity, Urine 1.013 1.005 - 1.030   pH 7.0 5.0 - 8.0   Glucose, UA NEGATIVE NEGATIVE mg/dL   Hgb urine dipstick NEGATIVE NEGATIVE   Bilirubin Urine NEGATIVE NEGATIVE   Ketones, ur NEGATIVE NEGATIVE mg/dL   Protein, ur NEGATIVE NEGATIVE mg/dL   Nitrite NEGATIVE NEGATIVE   Leukocytes, UA NEGATIVE NEGATIVE   RBC / HPF 0-5 0 - 5 RBC/hpf   WBC, UA 0-5 0 - 5 WBC/hpf   Bacteria, UA NONE SEEN NONE SEEN   Squamous Epithelial / LPF 0-5 0 - 5   Mucus PRESENT     Comment: Performed at [facility], [place] [address], [place], [place] [address]  I-Stat Troponin, ED (not at [facility])     Status: None   Collection Time: 10/06/17  8:07 PM  Result Value Ref Range   Troponin i, poc 0.01 0.00 - 0.08 ng/mL   Comment 3            Comment: Due to the release kinetics of cTnI, a negative result within the first hours of the onset of symptoms does not rule out myocardial infarction with certainty. If myocardial infarction is still suspected, repeat the test at appropriate intervals.    Dg Chest 2 View  Result Date: 10/06/2017 CLINICAL DATA:  Syncope EXAM: CHEST - 2 VIEW COMPARISON:  None. FINDINGS: The lungs are clear without focal pneumonia, edema, pneumothorax or pleural effusion. The cardiopericardial silhouette is within normal limits for size. The visualized bony structures of the thorax are intact. Telemetry leads overlie the chest. IMPRESSION: No active cardiopulmonary disease. Electronically Signed   By: Misty Stanley M.D.   On: 10/06/2017 20:32   Ct Head Wo Contrast  Result Date: 10/06/2017 CLINICAL DATA:  Syncope. EXAM: CT HEAD WITHOUT CONTRAST TECHNIQUE: Contiguous axial images were obtained from the base of the skull through the vertex without intravenous contrast. COMPARISON:  None. FINDINGS: Brain: There is no evidence for acute hemorrhage, hydrocephalus, mass lesion, or abnormal extra-axial fluid collection. No definite CT evidence for acute infarction. Vascular: No hyperdense vessel or unexpected calcification. Skull: No evidence for fracture. No worrisome lytic or sclerotic lesion. Sinuses/Orbits: The visualized paranasal sinuses and mastoid air cells are clear. Visualized portions of the globes and intraorbital fat are unremarkable. Other: None. IMPRESSION: Normal exam. Electronically Signed   By: Misty Stanley M.D.   On: 10/06/2017 18:25    Pending Labs Unresulted Labs (From admission, onward)    Start     Ordered   10/13/17 0500  Creatinine, serum  (enoxaparin  (LOVENOX)    CrCl >/= 30 ml/min)  Weekly,   R    Comments:  while on enoxaparin therapy    10/06/17 2132   10/07/17 2831  Basic metabolic panel  Daily,  R     10/06/17 2132   10/07/17 0500  Magnesium  Daily,   R     10/06/17 2132   10/06/17 2130  D-dimer, quantitative (not at [facility])  Add-on,   R     10/06/17 2132          Vitals/Pain Today's Vitals   10/06/17 1712 10/06/17 1830 10/06/17 1900 10/06/17 2103  BP: (!) 124/103 140/81  123/82  Pulse: 76 (!) 59  67  Resp: '16 18  16  ' Temp: 98.2 F (36.8 C)   98.1 F (36.7 C)  TempSrc: Oral   Oral  SpO2: 100% 100%  100%  Weight:      Height:      PainSc:  6  4      Isolation Precautions No active isolations  Medications Medications  gabapentin (NEURONTIN) capsule 300 mg (has no administration in time range)  tiZANidine (ZANAFLEX) tablet 4 mg (has no administration in time range)  enoxaparin (LOVENOX) injection 40 mg (has no administration in time range)  losartan (COZAAR) tablet 50 mg (has no administration in time range)  sodium chloride 0.9 % bolus 1,000 mL (0 mLs Intravenous Stopped 10/06/17 2145)  acetaminophen (TYLENOL) tablet 1,000 mg (1,000 mg Oral Given 10/06/17 1842)  metoCLOPramide (REGLAN) injection 10 mg (10 mg Intravenous Given 10/06/17 1842)  diphenhydrAMINE (BENADRYL) injection 25 mg (25 mg Intravenous Given 10/06/17 1842)  potassium chloride SA (K-DUR,KLOR-CON) CR tablet 40 mEq (40 mEq Oral Given 10/06/17 2153)    Mobility walks

## 2017-10-06 NOTE — ED Provider Notes (Signed)
[place] [facility] DEPT Provider Note   CSN: [id number] Arrival date & time: 10/06/17  1659     History   Chief Complaint Chief Complaint  Patient presents with  . Loss of Consciousness  . Chest Pain    HPI Jamie [name] is a 57 y.o. female.  HPI   Patient is a 80 female with history of hypertension, hyperlipidemia, headaches, allergic rhinitis presenting for syncopal episode and chest pain.  Patient reports that around 12:30 PM today she experienced a sudden "pinching sensation in the center of her chest, nonradiating, and then her next realization was waking up on the floor.  Patient reports that her mother called out her name, and this awoke her to the realization that she had "dropped out".  Patient denies any prolonged loss of consciousness, confusion after the episode, biting of tongue or loss of bladder control.  Patient reports that shut when she awoke, she did not have any chest pain.  Patient reports that she has some mild shortness of breath at baseline with exertion, however this is not increased today.  Patient reports she has subsequently had a left parietal and left occipital headache, dull in nature without visual disturbance, weakness or numbness, changes in speech.  Patient denies any nausea or vomiting.  Patient denies retrograde amnesia.  No blood thinning medications.  No family history of intracranial, thoracic, or other aneurysms.  No family history of early MI.  No family history of sudden cardiac death.  Patient reports she has never syncopized before.  Past Medical History:  Diagnosis Date  . Allergy   . Arthritis    in back  . Chest pain    neg cath 2016 after false positive myoview  . Chest pain   . Depression   . GERD (gastroesophageal reflux disease)   . High blood pressure   . Hyperlipidemia   . Hypertension   . Joint pain   . Knee pain   . Low back pain   . Migraine   . Mitral valve prolapse   . Muscle cramps   .  Prediabetes     Patient Active Problem List   Diagnosis Date Noted  . Other fatigue 10/01/2017  . Shortness of breath on exertion 10/01/2017  . Hyperlipemia 05/06/2015  . GERD (gastroesophageal reflux disease) 08/01/2010  . Hypertension 01/20/2007  . Obesity 09/05/2006  . Migraine headache 09/05/2006    Past Surgical History:  Procedure Laterality Date  . BACK SURGERY    . CESAREAN SECTION    . LEFT HEART CATHETERIZATION WITH CORONARY ANGIOGRAM N/A 07/08/2013   Procedure: LEFT HEART CATHETERIZATION WITH CORONARY ANGIOGRAM;  Surgeon: Josue Hector, MD;  Location: [facility] CATH LAB;  Service: Cardiovascular;  Laterality: N/A;  . TUBAL LIGATION       OB History    Gravida  1   Para  1   Term      Preterm      AB      Living  1     SAB      TAB      Ectopic      Multiple      Live Births               Home Medications    Prior to Admission medications   Medication Sig Start Date End Date Taking? Authorizing Provider  acetaminophen (TYLENOL) 500 MG tablet Take 1,500 mg by mouth at bedtime.    [provider]  amLODipine (NORVASC) 5 MG tablet TAKE 1.5 TABLETS (7.5 MG TOTAL) BY MOUTH DAILY. 09/24/17   Lucretia Kern, DO  gabapentin (NEURONTIN) 300 MG capsule Neurontin 300 mg capsule  Take 1 capsule every day by oral route.  take 1 tab at bedtime    [provider]  GLUCOSAMINE-CHONDROITIN PO Take 1 tablet by mouth daily.    [provider]  guaiFENesin (MUCINEX) 600 MG 12 hr tablet Take by mouth 2 (two) times daily.    [provider]  hydrochlorothiazide (HYDRODIURIL) 25 MG tablet TAKE 1 TABLET BY MOUTH EVERY DAY 08/26/17   Lucretia Kern, DO  Ibuprofen-Famotidine (DUEXIS) 800-26.6 MG TABS Take by mouth daily.    [provider]  losartan (COZAAR) 100 MG tablet TAKE 1 TABLET BY MOUTH EVERY DAY 09/24/17   Lucretia Kern, DO  Potassium 99 MG TABS Take 1 tablet by mouth daily.    [provider]  pravastatin  (PRAVACHOL) 40 MG tablet TAKE 1 TABLET (40 MG TOTAL) BY MOUTH DAILY. 07/19/16   Lucretia Kern, DO  ranitidine (ZANTAC) 150 MG tablet Take 150 mg by mouth 2 (two) times daily.    [provider]  tiZANidine (ZANAFLEX) 4 MG tablet TAKE 1 TABLET (4 MG TOTAL) BY MOUTH AT BEDTIME AS NEEDED FOR MUSCLE SPASMS. 04/11/16   Lucretia Kern, DO  valACYclovir (VALTREX) 500 MG tablet Take 500 mg by mouth 2 (two) times daily.    [provider]    Family History Family History  Problem Relation Age of Onset  . Hyperlipidemia Mother   . Hypertension Mother   . AAA (abdominal aortic aneurysm) Mother   . Stroke Father   . Hypertension Father   . Hyperlipidemia Brother        x2  . Hypertension Brother        x2  . Liver cancer Maternal Aunt     Social History Social History   Tobacco Use  . Smoking status: Former Smoker    Types: Cigarettes    Last attempt to quit: 07/31/1996    Years since quitting: 21.1  . Smokeless tobacco: Never Used  Substance Use Topics  . Alcohol use: Yes    Comment: very little  . Drug use: No     Allergies   Patient has no known allergies.   Review of Systems Review of Systems  Constitutional: Negative for chills and fever.  HENT: Negative for congestion and sore throat.   Respiratory: Negative for cough, chest tightness and shortness of breath.   Cardiovascular: Positive for chest pain. Negative for palpitations and leg swelling.  Gastrointestinal: Negative for abdominal pain, nausea and vomiting.  Genitourinary: Negative for dysuria and flank pain.  Musculoskeletal: Negative for back pain and myalgias.  Skin: Negative for rash.  Allergic/Immunologic: Negative for immunocompromised state.  Neurological: Positive for syncope and headaches. Negative for dizziness and light-headedness.     Physical Exam Updated Vital Signs BP 140/81 (BP Location: Left Arm)   Pulse (!) 59   Temp 98.2 F (36.8 C) (Oral)   Resp 18   Ht 5\' 10"  (1.778 m)    Wt 127 kg   LMP 10/04/2014   SpO2 100%   BMI 40.18 kg/m   Physical Exam  Constitutional: She appears well-developed and well-nourished. No distress.  HENT:  Head: Normocephalic and atraumatic.  Mouth/Throat: Oropharynx is clear and moist.  Eyes: Pupils are equal, round, and reactive to light. Conjunctivae and EOM are normal.  Neck: Normal  range of motion. Neck supple.  Cardiovascular: Normal rate, regular rhythm, S1 normal and S2 normal.  No murmur heard. Pulses:      Radial pulses are 2+ on the right side, and 2+ on the left side.       Dorsalis pedis pulses are 2+ on the right side, and 2+ on the left side.  No lower exam edema.  No calf tenderness.  Pulmonary/Chest: Effort normal and breath sounds normal. She has no wheezes. She has no rales.  Abdominal: Soft. She exhibits no distension. There is no tenderness. There is no guarding.  Musculoskeletal: Normal range of motion. She exhibits no edema or deformity.  Lymphadenopathy:    She has no cervical adenopathy.  Neurological: She is alert. GCS eye subscore is 4. GCS verbal subscore is 5. GCS motor subscore is 6.  Mental Status:  Alert, oriented, thought content appropriate, able to give a coherent history. Speech fluent without evidence of aphasia. Able to follow 2 step commands without difficulty.  Cranial Nerves:  II:  Peripheral visual fields grossly normal, pupils equal, round, reactive to light III,IV, VI: ptosis not present, extra-ocular motions intact bilaterally  V,VII: smile symmetric, facial light touch sensation equal VIII: hearing grossly normal to voice  X: uvula elevates symmetrically  XI: bilateral shoulder shrug symmetric and strong XII: midline tongue extension without fassiculations Motor:  Normal tone. 5/5 in upper and lower extremities bilaterally including strong and equal grip strength and dorsiflexion/plantar flexion Cerebellar: normal finger-to-nose with bilateral upper extremities Gait: normal gait  and balance Stance: No pronator drift and good coordination, strength, and position sense with tapping of bilateral arms (performed in sitting position). CV: distal pulses palpable throughout    Skin: Skin is warm and dry. No rash noted. No erythema.  Psychiatric: She has a normal mood and affect. Her behavior is normal. Judgment and thought content normal.  Nursing note and vitals reviewed.    ED Treatments / Results  Labs (all labs ordered are listed, but only abnormal results are displayed) Labs Reviewed  BASIC METABOLIC PANEL - Abnormal; Notable for the following components:      Result Value   Sodium 146 (*)    All other components within normal limits  CBC  URINALYSIS, ROUTINE W REFLEX MICROSCOPIC  CBG MONITORING, ED  CBG MONITORING, ED  I-STAT BETA HCG BLOOD, ED ([facility], [facility], [facility] ONLY)  I-STAT TROPONIN, ED    EKG EKG Interpretation  Date/Time:  Sunday October 06 2017 17:14:53 EDT Ventricular Rate:  65 PR Interval:    QRS Duration: 111 QT Interval:  418 QTC Calculation: 435 R Axis:   31 Text Interpretation:  Sinus rhythm Minimal ST elevation, anterior leads No significant change since last tracing Confirmed by Blanchie Dessert [phone]) on 10/06/2017 5:52:27 PM   Radiology Ct Head Wo Contrast  Result Date: 10/06/2017 CLINICAL DATA:  Syncope. EXAM: CT HEAD WITHOUT CONTRAST TECHNIQUE: Contiguous axial images were obtained from the base of the skull through the vertex without intravenous contrast. COMPARISON:  None. FINDINGS: Brain: There is no evidence for acute hemorrhage, hydrocephalus, mass lesion, or abnormal extra-axial fluid collection. No definite CT evidence for acute infarction. Vascular: No hyperdense vessel or unexpected calcification. Skull: No evidence for fracture. No worrisome lytic or sclerotic lesion. Sinuses/Orbits: The visualized paranasal sinuses and mastoid air cells are clear. Visualized portions of the globes and intraorbital fat are unremarkable. Other:  None. IMPRESSION: Normal exam. Electronically Signed   By: Misty Stanley M.D.   On: 10/06/2017 18:25  Procedures Procedures (including critical care time)  Medications Ordered in ED Medications  sodium chloride 0.9 % bolus 1,000 mL (1,000 mLs Intravenous New Bag/Given 10/06/17 1857)  acetaminophen (TYLENOL) tablet 1,000 mg (1,000 mg Oral Given 10/06/17 1842)  metoCLOPramide (REGLAN) injection 10 mg (10 mg Intravenous Given 10/06/17 1842)  diphenhydrAMINE (BENADRYL) injection 25 mg (25 mg Intravenous Given 10/06/17 1842)     Initial Impression / Assessment and Plan / ED Course  I have reviewed the triage vital signs and the nursing notes.  Pertinent labs & imaging results that were available during my care of the patient were reviewed by me and considered in my medical decision making (see chart for details).  Clinical Course as of Oct 06 2112  Nancy Fetter Oct 06, 2017  2020 Not crossing over in computer. Result is negative.   I-Stat Troponin, ED (not at [facility]) [AM]  2104 Spoke with Dr. Hampton Abbot of [facility] will admit patient.  I appreciate her involvement in the care of this patient.   [AM]    Clinical Course User Index [AM] Albesa Seen, PA-C   DDx includes: Orthostatic hypotension Stroke Vertebral artery dissection/stenosis Dysrhythmia PE Vasovagal/neurocardiogenic syncope Aortic stenosis Valvular disorder/Cardiomyopathy Anemia  Patient nontoxic-appearing and neurologically intact.  Patient hemodynamically stable.  Patient without tachycardia, tachypnea or shortness of breath emergency department patient is chest pain-free at present, but does have a headache.  Will order CT head given that we are within 6-hour window to rule out subarachnoid hemorrhage as cause of patient's syncope.  Work-up thus far revealing negative troponin, EKG consistent with prior and shows minimal ST elevation in anterior leads, but not meeting criteria for acute ischemia.  No anemia.  Normal  electrolytes.  Chest x-ray is without widened mediastinum or other acute cardiopulmonary abnormality.  Pulses are equal in all extremities, patient is nontoxic, therefore doubt thoracic aortic dissection, syncope.  Additionally, pulmonary embolism was considered, however patient is not having active chest pain or shortness of breath, and is not tachycardic and is 100% on room air.   Given the sudden nature of patient's syncope, do have concerns for cardiogenic syncope.  Will discuss admission for syncope rule out.   Final Clinical Impressions(s) / ED Diagnoses   Final diagnoses:  Syncope, unspecified syncope type  Abnormal EKG    ED Discharge Orders    None       Tamala Julian 10/06/17 2129    Blanchie Dessert, MD 10/06/17 2213

## 2017-10-06 NOTE — H&P (Signed)
History and Physical  Jamie [name] DXI:[id number] DOB: [birth year] DOA: 10/06/2017 1717  Referring physician: Paulla Fore [facility] ED) PCP: Lucretia Kern, DO   HISTORY   Chief Complaint: syncope  HPI: Jamie [name] is a 57 y.o. female with prior hx of atypical chest pain (clean coronaries on cath 2015, HTN, HLD, obesity who presented after syncopal episode today. She reports she was cooking in the kitchen around 11am this morning and then felt a transient wave of heat in her face and slight clamminess as well a twinge of upper mid chest pain. Of note, she has had prior episodes of chest pain with extensive cardiac workup including cath in 2015 that was normal. She took some deep breaths and the chest tightness subsided. Then the last event she recalls is bending over to reach a pan on a low shelf. At that point she thinks she likely lost consciousness because she heard her mother calling her name -- patient's mother had reportedly heard her falling down and hitting the kitchen floor. She woke up lying on the floor with a left-sided headache. Estimated that she likely had LOC for a few seconds. No post-ictal sx or incontinence. No vision changes or focal neuro deficits.   Patient reports that although she has not had syncope in the past, she has been having episodes of lightheadedess on occasion when getting up from sitting to standing position. No recent medication changes but she is compliant with amlodipine, HCTZ, and losartan. Reports frequent cramping with HCTZ and having to urinate frequently at night with HCTZ use. Also takes potassium supplement.    Review of Systems:  + syncope as above + transient chest tightness + chronic mild non pitting pedal edema - no fevers/chills - no cough - no dyspnea on exertion - no PND, orthopnea - no nausea/vomiting; no tarry, melanotic or bloody stools - no dysuria  - no weight changes - no focal neuro deficits  Rest of systems reviewed are negative,  except as per above history.   ED course:  Vitals Blood pressure 123/82, pulse 67, temperature 98.1 F (36.7 C), temperature source Oral, resp. rate 16, height 5\' 10"  (1.778 m), weight 127 kg, last menstrual period 10/04/2014, SpO2 100 %. Received 1L NS; tylenol 1gm x 1; benadryl 25mg  IV x 1; reglan 10mg  IVx 1    Past Medical History:  Diagnosis Date  . Allergy   . Arthritis    in back  . Chest pain    neg cath 2016 after false positive myoview  . Chest pain   . Depression   . GERD (gastroesophageal reflux disease)   . High blood pressure   . Hyperlipidemia   . Hypertension   . Joint pain   . Knee pain   . Low back pain   . Migraine   . Mitral valve prolapse   . Muscle cramps   . Prediabetes    Past Surgical History:  Procedure Laterality Date  . BACK SURGERY    . CESAREAN SECTION    . LEFT HEART CATHETERIZATION WITH CORONARY ANGIOGRAM N/A 07/08/2013   Procedure: LEFT HEART CATHETERIZATION WITH CORONARY ANGIOGRAM;  Surgeon: Josue Hector, MD;  Location: [facility] CATH LAB;  Service: Cardiovascular;  Laterality: N/A;  . TUBAL LIGATION      Social History:  reports that she quit smoking about 21 years ago. Her smoking use included cigarettes. She has never used smokeless tobacco. She reports that she drinks alcohol. She reports that she  does not use drugs.  No Known Allergies  Family History  Problem Relation Age of Onset  . Hyperlipidemia Mother   . Hypertension Mother   . AAA (abdominal aortic aneurysm) Mother   . Stroke Father   . Hypertension Father   . Hyperlipidemia Brother        x2  . Hypertension Brother        x2  . Liver cancer Maternal Aunt       Prior to Admission medications   Medication Sig Start Date End Date Taking? Authorizing Provider  acetaminophen (TYLENOL) 500 MG tablet Take 1,500 mg by mouth at bedtime.    [provider]  amLODipine (NORVASC) 5 MG tablet TAKE 1.5 TABLETS (7.5 MG TOTAL) BY MOUTH DAILY. 09/24/17   Lucretia Kern, DO    gabapentin (NEURONTIN) 300 MG capsule Neurontin 300 mg capsule  Take 1 capsule every day by oral route.  take 1 tab at bedtime    [provider]  GLUCOSAMINE-CHONDROITIN PO Take 1 tablet by mouth daily.    [provider]  guaiFENesin (MUCINEX) 600 MG 12 hr tablet Take by mouth 2 (two) times daily.    [provider]  hydrochlorothiazide (HYDRODIURIL) 25 MG tablet TAKE 1 TABLET BY MOUTH EVERY DAY 08/26/17   Lucretia Kern, DO  Ibuprofen-Famotidine (DUEXIS) 800-26.6 MG TABS Take by mouth daily.    [provider]  losartan (COZAAR) 100 MG tablet TAKE 1 TABLET BY MOUTH EVERY DAY 09/24/17   Lucretia Kern, DO  Potassium 99 MG TABS Take 1 tablet by mouth daily.    [provider]  pravastatin (PRAVACHOL) 40 MG tablet TAKE 1 TABLET (40 MG TOTAL) BY MOUTH DAILY. 07/19/16   Lucretia Kern, DO  ranitidine (ZANTAC) 150 MG tablet Take 150 mg by mouth 2 (two) times daily.    [provider]  tiZANidine (ZANAFLEX) 4 MG tablet TAKE 1 TABLET (4 MG TOTAL) BY MOUTH AT BEDTIME AS NEEDED FOR MUSCLE SPASMS. 04/11/16   Lucretia Kern, DO  valACYclovir (VALTREX) 500 MG tablet Take 500 mg by mouth 2 (two) times daily.    [provider]    PHYSICAL EXAM   Temp:  [98.1 F (36.7 C)-98.2 F (36.8 C)] 98.1 F (36.7 C) (10/06 2103) Pulse Rate:  [59-76] 67 (10/06 2103) Resp:  [16-18] 16 (10/06 2103) BP: (123-140)/(81-103) 123/82 (10/06 2103) SpO2:  [100 %] 100 % (10/06 2103) Weight:  [127 kg] 127 kg (10/06 1710)  BP 123/82 (BP Location: Left Arm)   Pulse 67   Temp 98.1 F (36.7 C) (Oral)   Resp 16   Ht 5\' 10"  (1.778 m)   Wt 127 kg   LMP 10/04/2014   SpO2 100%   BMI 40.18 kg/m    GEN obese middle-aged african-american female; ambulating around the room comfortably HEENT NCAT EOM intact PERRL; clear oropharynx, no cervical LAD; moist mucus membranes  JVP estimated 5 cm H2O above RA; no HJR ; no carotid bruits b/l ;  CV regular normal rate; normal  S1 and S2; no m/r/g or S3/S4; PMI nondisplaced; no parasternal heave  RESP CTA b/l; breathing unlabored and symmetric  ABD soft NT ND +normoactive BS  EXT warm throughout b/l; very mild non pitting pedal edema b/l PULSES  DP and radials 2+ intact /l  SKIN/MSK no rashes or lesions  NEURO/PSYCH AAOx4; no focal deficits   DATA   LABS ON ADMISSION:  Basic Metabolic Panel: Recent Labs  Lab 10/01/17  1150 10/06/17 1740  NA 142 146*  K 3.9 3.5  CL 100 109  CO2 24 27  GLUCOSE 81 97  BUN 10 13  CREATININE 0.77 1.00  CALCIUM 9.6 9.5   CBC: Recent Labs  Lab 10/06/17 1740  WBC 5.4  HGB 12.6  HCT 39.6  MCV 89.6  PLT 284   Liver Function Tests: Recent Labs  Lab 10/01/17 1150  AST 12  ALT 16  ALKPHOS 71  BILITOT 0.4  PROT 7.2  ALBUMIN 4.5   No results for input(s): LIPASE, AMYLASE in the last 168 hours. No results for input(s): AMMONIA in the last 168 hours. Coagulation:  Lab Results  Component Value Date   INR 1.0 07/02/2013   No results found for: PTT Lactic Acid, Venous:  No results found for: LATICACIDVEN Cardiac Enzymes: No results for input(s): CKTOTAL, CKMB, CKMBINDEX, TROPONINI in the last 168 hours. Urinalysis:    Component Value Date/Time   COLORURINE YELLOW 10/06/2017 1837   APPEARANCEUR CLEAR 10/06/2017 1837   LABSPEC 1.013 10/06/2017 1837   PHURINE 7.0 10/06/2017 1837   GLUCOSEU NEGATIVE 10/06/2017 1837   HGBUR NEGATIVE 10/06/2017 1837   HGBUR negative 01/13/2007 0807   BILIRUBINUR NEGATIVE 10/06/2017 1837   BILIRUBINUR neg 02/23/2017 1014   KETONESUR NEGATIVE 10/06/2017 1837   PROTEINUR NEGATIVE 10/06/2017 1837   UROBILINOGEN 1.0 02/23/2017 1014   UROBILINOGEN 0.2 01/13/2007 0807   NITRITE NEGATIVE 10/06/2017 1837   LEUKOCYTESUR NEGATIVE 10/06/2017 1837    BNP (last 3 results) No results for input(s): PROBNP in the last 8760 hours. CBG: Recent Labs  Lab 10/06/17 1710  GLUCAP 86    Radiological Exams on Admission: Dg Chest 2  View  Result Date: 10/06/2017 CLINICAL DATA:  Syncope EXAM: CHEST - 2 VIEW COMPARISON:  None. FINDINGS: The lungs are clear without focal pneumonia, edema, pneumothorax or pleural effusion. The cardiopericardial silhouette is within normal limits for size. The visualized bony structures of the thorax are intact. Telemetry leads overlie the chest. IMPRESSION: No active cardiopulmonary disease. Electronically Signed   By: Misty Stanley M.D.   On: 10/06/2017 20:32   Ct Head Wo Contrast  Result Date: 10/06/2017 CLINICAL DATA:  Syncope. EXAM: CT HEAD WITHOUT CONTRAST TECHNIQUE: Contiguous axial images were obtained from the base of the skull through the vertex without intravenous contrast. COMPARISON:  None. FINDINGS: Brain: There is no evidence for acute hemorrhage, hydrocephalus, mass lesion, or abnormal extra-axial fluid collection. No definite CT evidence for acute infarction. Vascular: No hyperdense vessel or unexpected calcification. Skull: No evidence for fracture. No worrisome lytic or sclerotic lesion. Sinuses/Orbits: The visualized paranasal sinuses and mastoid air cells are clear. Visualized portions of the globes and intraorbital fat are unremarkable. Other: None. IMPRESSION: Normal exam. Electronically Signed   By: Misty Stanley M.D.   On: 10/06/2017 18:25    EKG: Independently reviewed. NSR (unchanged from prior)   ASSESSMENT AND PLAN   Assessment: Jamie [name] is a 57 y.o. female with prior hx of atypical chest pain (clean coronaries on cath 2015, HTN, HLD, reported hx of MVP (although no clear hx) who presents with syncope. Upon further investigation, history is actually more consistent with orthostasis given her prodromal symptoms. Arrhythmia related syncope unlikely but will monitor with telemetry overnight. Her labs and exam are also suggestive of mild dehydration (borderline BP, hypokalemia, slight Cr elevation). Rest of labwork unremarkable thus far. Will monitor overnight for syncope  workup including TTE and will down-titrate her home HTN meds on discharge.  Active Problems:   Syncope  Plan:   # Syncope -- likely orthostatic in setting of dehydration and anti-HTN regimen > received NS x 1L in ED - orthostatic vitals tomorrow AM - telemetry  - TTE ordered - ordered losartan as 50mg  tomorrow (hold if sBP < 120) - would discontinue HCTZ and amlodipine on discharge   # Chronic hypokalemia: K 3.5 on admission > on home K supplement - replete overnight with 40 mEQ - discontinue home HCTZ  - check AM lytes  # Mild AKI with Cr 1.0 on admission (baseline Cr 0.7-0.8) > likely pre-renal  - check BMP tomorrow AM since post fluid resuscitation   # Back pain/muscle spasm (chronic) - resume home gabapentin and prn tizanidine   # HLD - can resume outpatient management with pravastatin on discharge  DVT Prophylaxis: lovenox Code Status:  Full Code Family Communication: husband at bedside  Disposition Plan: admit to obs and anticipate dc pending echo tomorrow  Patient contact: Extended Emergency Contact Information Primary Emergency Contact: [facility] Address: [place],  [address] [place] Phone: [phone] Work Phone: [phone] Mobile Phone: [phone] Relation: Spouse  Time spent: > 35 minutes  Colbert Ewing, MD [facility] Pager [phone]  If 7PM-7AM, please contact night-coverage [email or web address] Password [facility] 10/06/2017, 9:33 PM

## 2017-10-06 NOTE — ED Triage Notes (Signed)
Pt had a syncopal episode today at 1130 today. Pt was in the midst of cooking. Pt states that she had a stabbing chest pain right before this occurred. Pt doesn't believe she hit her head, but does have a headache. Pt states she fell on her right side.

## 2017-10-07 ENCOUNTER — Observation Stay (HOSPITAL_BASED_OUTPATIENT_CLINIC_OR_DEPARTMENT_OTHER): Payer: 59

## 2017-10-07 DIAGNOSIS — R55 Syncope and collapse: Secondary | ICD-10-CM | POA: Diagnosis not present

## 2017-10-07 DIAGNOSIS — I5030 Unspecified diastolic (congestive) heart failure: Secondary | ICD-10-CM | POA: Diagnosis not present

## 2017-10-07 LAB — MAGNESIUM: MAGNESIUM: 2.2 mg/dL (ref 1.7–2.4)

## 2017-10-07 LAB — ECHOCARDIOGRAM COMPLETE
Height: 70 in
Weight: 4532.8 oz

## 2017-10-07 LAB — BASIC METABOLIC PANEL
ANION GAP: 11 (ref 5–15)
BUN: 12 mg/dL (ref 6–20)
CHLORIDE: 109 mmol/L (ref 98–111)
CO2: 22 mmol/L (ref 22–32)
Calcium: 8.4 mg/dL — ABNORMAL LOW (ref 8.9–10.3)
Creatinine, Ser: 0.84 mg/dL (ref 0.44–1.00)
GFR calc non Af Amer: >60 mL/min (ref >60)
Glucose, Bld: 104 mg/dL — ABNORMAL HIGH (ref 70–99)
Potassium: 3.5 mmol/L (ref 3.5–5.1)
Sodium: 142 mmol/L (ref 135–145)

## 2017-10-07 MED ORDER — ACETAMINOPHEN 500 MG PO TABS: 1000.0000 mg | ORAL_TABLET | Freq: Every day | ORAL | 0 refills | Status: DC

## 2017-10-07 MED ORDER — LOSARTAN POTASSIUM 50 MG PO TABS: 50.0000 mg | ORAL_TABLET | Freq: Every day | ORAL | 0 refills | Status: DC

## 2017-10-07 NOTE — Progress Notes (Signed)
  Echocardiogram 2D Echocardiogram has been performed.  Jamie [name] 10/07/2017, 10:06 AM

## 2017-10-07 NOTE — Discharge Summary (Signed)
Physician Discharge Summary  Jamie [name]  FIE:[id number]  DOB: 01/29/1960  DOA: 10/06/2017 PCP: Lucretia Kern, DO  Admit date: 10/06/2017 Discharge date: 10/07/2017  Admitted From: Home Disposition: Home   Recommendations for Outpatient Follow-up:  1. Follow up with PCP in 1 week  2. Please obtain BMP in one week to monitor electrolytes  3. Monitor BP   Discharge Condition: Stable CODE STATUS: Full code Diet recommendation: Heart Healthy   Brief/Interim Summary: For full details see H&P/Progress note, but in brief, Jamie [name] is a 57 year old female with medical history of hypertension, hyperlipidemia obesity and atypical chest pain (clean coronaries on cath 2015) presented to the emergency department after having a syncopal episode.  Upon further investigation history was consistent with orthostatics given prodromal syndrome, with lab findings suggestive of mild dehydration, mild hyponatremia, slight Cr elevation and borderline BP.  CT of the head was negative and patient was placed in observation for syncope work-up.  Orthostatics were checked after her evaluation but were normal.  Echocardiogram was performed with normal LVEF, grade 1 diastolic dysfunction and mild left ventricle hypertrophy.  Telemetry monitor remained in sinus rhythm with no arrhythmias. Electrolytes normalized.  Patient has clinically improved, denies chest pain, shortness of breath, palpitations and dizziness.  Syncope was attributed to orthostatic/vasovagal ?  Too aggressive antihypertensive regimen.  Blood pressure medications were adjusted. Given negative work-up in patient clinically stable will discharge home to follow-up with PCP.  Subjective: Patient seen and examined, she has no complaints. Denies chest pain, SOB, palpitations and dizziness. No acute events overnight. Tolerating diet well and afebrile.   Discharge Diagnoses/Hospital Course:  Syncope - felt to be orthostatic due to mild dehydration ?  Vasovagal response and possible too aggressive BP control.   Chronic hypokalemia -  will d/c HCTZ and potassium supplement as patient will be on losartan   HTN - BP stable during hospital stay off HCTZ and Losartan at reduse dose. Will discharge on Losartan 50 mg daily and amlodipine 5 mg daily. Follow up with PCP for BP check up in 1 week   All other chronic medical condition were stable during the hospitalization.  On the day of the discharge the patient's vitals were stable, and no other acute medical condition were reported by patient. the patient was felt safe to be discharge to home.   Discharge Instructions  You were cared for by a hospitalist during your hospital stay. If you have any questions about your discharge medications or the care you received while you were in the hospital after you are discharged, you can call the unit and asked to speak with the hospitalist on call if the hospitalist that took care of you is not available. Once you are discharged, your primary care physician will handle any further medical issues. Please note that NO REFILLS for any discharge medications will be authorized once you are discharged, as it is imperative that you return to your primary care physician (or establish a relationship with a primary care physician if you do not have one) for your aftercare needs so that they can reassess your need for medications and monitor your lab values.  Discharge Instructions    Activity as tolerated - No restrictions   Complete by:  As directed    Call MD for:  difficulty breathing, headache or visual disturbances   Complete by:  As directed    Call MD for:  extreme fatigue   Complete by:  As directed  Call MD for:  hives   Complete by:  As directed    Call MD for:  persistant dizziness or light-headedness   Complete by:  As directed    Call MD for:  persistant nausea and vomiting   Complete by:  As directed    Call MD for:  redness, tenderness, or signs  of infection (pain, swelling, redness, odor or green/yellow discharge around incision site)   Complete by:  As directed    Call MD for:  severe uncontrolled pain   Complete by:  As directed    Call MD for:  temperature >100.4   Complete by:  As directed    Diet - low sodium heart healthy   Complete by:  As directed      Allergies as of 10/07/2017   No Known Allergies     Medication List    STOP taking these medications   DUEXIS 800-26.6 MG Tabs Generic drug:  Ibuprofen-Famotidine   hydrochlorothiazide 25 MG tablet Commonly known as:  HYDRODIURIL   Potassium 99 MG Tabs   tiZANidine 4 MG tablet Commonly known as:  ZANAFLEX   valACYclovir 500 MG tablet Commonly known as:  VALTREX     TAKE these medications   acetaminophen 500 MG tablet Commonly known as:  TYLENOL Take 2 tablets (1,000 mg total) by mouth at bedtime. What changed:  how much to take   amLODipine 5 MG tablet Commonly known as:  NORVASC TAKE 1.5 TABLETS (7.5 MG TOTAL) BY MOUTH DAILY.   GLUCOSAMINE-CHONDROITIN PO Take 1 tablet by mouth daily.   losartan 50 MG tablet Commonly known as:  COZAAR Take 1 tablet (50 mg total) by mouth daily. What changed:    medication strength  how much to take   NEURONTIN 300 MG capsule Generic drug:  gabapentin Neurontin 300 mg capsule  Take 1 capsule every day by oral route.  take 1 tab at bedtime   pravastatin 40 MG tablet Commonly known as:  PRAVACHOL TAKE 1 TABLET (40 MG TOTAL) BY MOUTH DAILY.   ranitidine 150 MG tablet Commonly known as:  ZANTAC Take 150 mg by mouth 2 (two) times daily.       No Known Allergies  Consultations:  Procedures/Studies: Dg Chest 2 View  Result Date: 10/06/2017 CLINICAL DATA:  Syncope EXAM: CHEST - 2 VIEW COMPARISON:  None. FINDINGS: The lungs are clear without focal pneumonia, edema, pneumothorax or pleural effusion. The cardiopericardial silhouette is within normal limits for size. The visualized bony structures of the  thorax are intact. Telemetry leads overlie the chest. IMPRESSION: No active cardiopulmonary disease. Electronically Signed   By: Misty Stanley M.D.   On: 10/06/2017 20:32   Ct Head Wo Contrast  Result Date: 10/06/2017 CLINICAL DATA:  Syncope. EXAM: CT HEAD WITHOUT CONTRAST TECHNIQUE: Contiguous axial images were obtained from the base of the skull through the vertex without intravenous contrast. COMPARISON:  None. FINDINGS: Brain: There is no evidence for acute hemorrhage, hydrocephalus, mass lesion, or abnormal extra-axial fluid collection. No definite CT evidence for acute infarction. Vascular: No hyperdense vessel or unexpected calcification. Skull: No evidence for fracture. No worrisome lytic or sclerotic lesion. Sinuses/Orbits: The visualized paranasal sinuses and mastoid air cells are clear. Visualized portions of the globes and intraorbital fat are unremarkable. Other: None. IMPRESSION: Normal exam. Electronically Signed   By: Misty Stanley M.D.   On: 10/06/2017 18:25   ECHO 10/7  ------------------------------------------------------------------- Study Conclusions  - Left ventricle: The cavity size was normal. Wall thickness  was   increased in a pattern of mild LVH. Systolic function was normal.   The estimated ejection fraction was in the range of 60% to 65%.   Although no diagnostic regional wall motion abnormality was   identified, this possibility cannot be completely excluded on the   basis of this study. Doppler parameters are consistent with   abnormal left ventricular relaxation (grade 1 diastolic   dysfunction). - Aortic valve: There was no stenosis. - Mitral valve: There was no significant regurgitation. - Right ventricle: The cavity size was mildly dilated. Systolic   function was normal. - Tricuspid valve: Peak RV-RA gradient (S): 34 mm Hg. - Pulmonary arteries: PA peak pressure: 42 mm Hg (S). - Systemic veins: IVC measured 2.3 cm with > 50% respirophasic   variation,  suggesting RA pressure 8 mmHg. - Pericardium, extracardiac: A trivial pericardial effusion was   identified.  Impressions:  - Technically difficult study with poor acoustic windows. Normal LV   size and systolic function, EF 99-37%. Mild LV hypertrophy.   Mildly dilated RV with normal systolic function. Mild pulmonary   hypertension. No significant valvular abnormalities.   Discharge Exam: Vitals:   10/07/17 0816 10/07/17 1041  BP: (!) 143/92 135/90  Pulse: 68 67  Resp: 18   Temp:    SpO2: 97%    Vitals:   10/07/17 0811 10/07/17 0814 10/07/17 0816 10/07/17 1041  BP: 127/69 130/70 (!) 143/92 135/90  Pulse: 65 63 68 67  Resp: 16 16 18    Temp: 98.8 F (37.1 C)     TempSrc: Oral     SpO2: 94% 98% 97%   Weight:      Height:        General: Pt is alert, awake, not in acute distress Cardiovascular: RRR, S1/S2 +, no rubs, no gallops Respiratory: CTA bilaterally, no wheezing, no rhonchi Abdominal: Soft, NT, ND, bowel sounds + Extremities: no edema, no cyanosis   The results of significant diagnostics from this hospitalization (including imaging, microbiology, ancillary and laboratory) are listed below for reference.     Microbiology: No results found for this or any previous visit (from the past 240 hour(s)).   Labs: BNP (last 3 results) No results for input(s): BNP in the last 8760 hours. Basic Metabolic Panel: Recent Labs  Lab 10/01/17 1150 10/06/17 1740 10/07/17 0439  NA 142 146* 142  K 3.9 3.5 3.5  CL 100 109 109  CO2 24 27 22   GLUCOSE 81 97 104*  BUN 10 13 12   CREATININE 0.77 1.00 0.84  CALCIUM 9.6 9.5 8.4*  MG  --   --  2.2   Liver Function Tests: Recent Labs  Lab 10/01/17 1150  AST 12  ALT 16  ALKPHOS 71  BILITOT 0.4  PROT 7.2  ALBUMIN 4.5   No results for input(s): LIPASE, AMYLASE in the last 168 hours. No results for input(s): AMMONIA in the last 168 hours. CBC: Recent Labs  Lab 10/06/17 1740  WBC 5.4  HGB 12.6  HCT 39.6  MCV  89.6  PLT 284   Cardiac Enzymes: No results for input(s): CKTOTAL, CKMB, CKMBINDEX, TROPONINI in the last 168 hours. BNP: Invalid input(s): POCBNP CBG: Recent Labs  Lab 10/06/17 1710  GLUCAP 86   D-Dimer Recent Labs    10/06/17 1730  DDIMER 0.45   Hgb A1c No results for input(s): HGBA1C in the last 72 hours. Lipid Profile No results for input(s): CHOL, HDL, LDLCALC, TRIG, CHOLHDL, LDLDIRECT in the last 72 hours.  Thyroid function studies No results for input(s): TSH, T4TOTAL, T3FREE, THYROIDAB in the last 72 hours.  Invalid input(s): FREET3 Anemia work up No results for input(s): VITAMINB12, FOLATE, FERRITIN, TIBC, IRON, RETICCTPCT in the last 72 hours. Urinalysis    Component Value Date/Time   COLORURINE YELLOW 10/06/2017 1837   APPEARANCEUR CLEAR 10/06/2017 1837   LABSPEC 1.013 10/06/2017 1837   PHURINE 7.0 10/06/2017 1837   GLUCOSEU NEGATIVE 10/06/2017 1837   HGBUR NEGATIVE 10/06/2017 1837   HGBUR negative 01/13/2007 0807   BILIRUBINUR NEGATIVE 10/06/2017 1837   BILIRUBINUR neg 02/23/2017 1014   KETONESUR NEGATIVE 10/06/2017 1837   PROTEINUR NEGATIVE 10/06/2017 1837   UROBILINOGEN 1.0 02/23/2017 1014   UROBILINOGEN 0.2 01/13/2007 0807   NITRITE NEGATIVE 10/06/2017 1837   LEUKOCYTESUR NEGATIVE 10/06/2017 1837   Sepsis Labs Invalid input(s): PROCALCITONIN,  WBC,  LACTICIDVEN Microbiology No results found for this or any previous visit (from the past 240 hour(s)).   Time coordinating discharge: 32 minutes  SIGNED:  Chipper Oman, MD  [facility] 10/07/2017, 11:46 AM  Pager please text page via  [email or web address]  Note - This record has been created using [employer]. Chart creation errors have been sought, but may not always have been located. Such creation errors do not reflect on the standard of medical care.

## 2017-10-08 ENCOUNTER — Telehealth: Payer: Self-pay | Admitting: Family Medicine

## 2017-10-08 NOTE — Telephone Encounter (Signed)
Patient returned call to [facility].  Please call patient back at [phone].

## 2017-10-08 NOTE — Telephone Encounter (Signed)
Unable to reach patient at time of TCM Call.  Left message one home and cell for patient to return call when available.

## 2017-10-09 NOTE — Telephone Encounter (Signed)
Transition Care Management Follow-up Telephone Call  Per Discharge Summary: Admit date: 10/06/2017 Discharge date: 10/07/2017  Admitted From: Home Disposition: Home   Recommendations for Outpatient Follow-up:  1. Follow up with PCP in 1 week  2. Please obtain BMP in one week to monitor electrolytes  3. Monitor BP   Discharge Condition: Stable CODE STATUS: Full code Diet recommendation: Heart Healthy   --   How have you been since you were released from the hospital? "Pretty good."   Do you understand why you were in the hospital? yes   Do you understand the discharge instructions? yes   Where were you discharged to? Home   Items Reviewed:  Medications reviewed: yes  Allergies reviewed: yes  Dietary changes reviewed: yes  Referrals reviewed: yes   Functional Questionnaire:   Activities of Daily Living (ADLs):   She states they are independent in the following: ambulation, bathing and hygiene, feeding, continence, grooming, toileting and dressing States they require assistance with the following: none   Any transportation issues/concerns?: no   Any patient concerns? no   Confirmed importance and date/time of follow-up visits scheduled no, patient requesting appt 10/14/17 w/ PCP if possible. She is off work on this date. Please advise if there is anywhere patient can be worked in. Thanks!    Confirmed with patient if condition begins to worsen call PCP or go to the ER.  Patient was given the office number and encouraged to call back with question or concerns.  : yes

## 2017-10-10 NOTE — Telephone Encounter (Signed)
Could do 11:45 that day - ensure she arrives at 11:30 for check in appointment procedures. Thanks.

## 2017-10-10 NOTE — Telephone Encounter (Signed)
Appt scheduled as directed. Called patient and left message informing her of appointment and to call and reschedule if needed. Per our conversation yesterday, any time on 10/14 was ok for her.

## 2017-10-13 NOTE — Progress Notes (Signed)
HPI:  Using dictation device. Unfortunately this device frequently misinterprets words/phrases.  Jamie [name] is a pleasant 57 y.o. with a PMH listed below here for a hospital follow up. See transitional care phone note in Epic. Per review of discharge documents and patient: Hospitalized 10/6 to 10/07/17 Primary admitting complaint(s):syncopal episode, orthostasis w/ mild dehydration Primary admitting diagnosis (es) and treatment: syncope - had CT head (normal), echo (mild diastolic dysfunction), labs and telemetry monitoring. Attributed to orthostasis/vasovagal syncope.hctz was discontinued. Losartan was reduced.  Taking 7.5 mg of the Norvasc. Reports today: Doing okay.  Since stopping the diuretic, she has had increased swelling in her ankles and feet, this is uncomfortable for her.  She has had occasional mild headache.  No further syncope, dizziness, chest pain or shortness of breath.  She has long-standing occasional palpitations.  DUE FOR colon cancer screening - reports scheduled and also schedule for her mammogram.  ROS: See pertinent positives and negatives per HPI.  Past Medical History:  Diagnosis Date  . Allergy   . Arthritis    in back  . Chest pain    neg cath 2016 after false positive myoview  . Chest pain   . Depression   . GERD (gastroesophageal reflux disease)   . High blood pressure   . Hyperlipidemia   . Hypertension   . Joint pain   . Knee pain   . Low back pain   . Migraine   . Mitral valve prolapse   . Muscle cramps   . Prediabetes     Past Surgical History:  Procedure Laterality Date  . BACK SURGERY    . CESAREAN SECTION    . LEFT HEART CATHETERIZATION WITH CORONARY ANGIOGRAM N/A 07/08/2013   Procedure: LEFT HEART CATHETERIZATION WITH CORONARY ANGIOGRAM;  Surgeon: Josue Hector, MD;  [facility] C[facility] LAB;  Service: Cardiovascular;  Laterality: N/A;  . TUBAL LIGATION      Family History  Problem Relation Age of Onset  . Hyperlipidemia Mother    . Hypertension Mother   . AAA (abdominal aortic aneurysm) Mother   . Stroke Father   . Hypertension Father   . Hyperlipidemia Brother        x2  . Hypertension Brother        x2  . Liver cancer Maternal Aunt     SOCIAL HX: See HPI   Current Outpatient Medications:  .  acetaminophen (TYLENOL) 500 MG tablet, Take 2 tablets (1,000 mg total) by mouth at bedtime., Disp: 30 tablet, Rfl: 0 .  gabapentin (NEURONTIN) 300 MG capsule, Neurontin 300 mg capsule  Take 1 capsule every day by oral route.  take 1 tab at bedtime, Disp: , Rfl:  .  GLUCOSAMINE-CHONDROITIN PO, Take 1 tablet by mouth daily., Disp: , Rfl:  .  losartan (COZAAR) 50 MG tablet, Take 1 tablet (50 mg total) by mouth daily., Disp: 30 tablet, Rfl: 0 .  pravastatin (PRAVACHOL) 40 MG tablet, TAKE 1 TABLET (40 MG TOTAL) BY MOUTH DAILY., Disp: 90 tablet, Rfl: 3 .  ranitidine (ZANTAC) 150 MG tablet, Take 150 mg by mouth 2 (two) times daily., Disp: , Rfl:  .  amLODipine (NORVASC) 2.5 MG tablet, Take 1 tablet (2.5 mg total) by mouth daily., Disp: 90 tablet, Rfl: 3 .  hydrochlorothiazide (MICROZIDE) 12.5 MG capsule, Take 1 capsule (12.5 mg total) by mouth daily., Disp: 30 capsule, Rfl: 3  EXAM:  Vitals:   10/14/17 1148  BP: 102/80  Pulse: 74  Temp: 98.7 F (37.1  C)  SpO2: 98%    Body mass index is 41.81 kg/m.  GENERAL: vitals reviewed and listed above, alert, oriented, appears well hydrated and in no acute distress  HEENT: atraumatic, conjunttiva clear, no obvious abnormalities on inspection of external nose and ears  NECK: no obvious masses on inspection  LUNGS: clear to auscultation bilaterally, no wheezes, rales or rhonchi, good air movement  CV: HRRR, bilateral foot, ankle and lower leg edema 1+  MS: moves all extremities without noticeable abnormality  PSYCH: pleasant and cooperative, no obvious depression or anxiety  ASSESSMENT AND PLAN:  Discussed the following assessment and plan:  Syncope, unspecified  syncope type  Hypertension, unspecified type  Hyperlipidemia, unspecified hyperlipidemia type  Morbid obesity ([place])  Leg edema  -She has developed some bad edema off of the diuretic, she would like to restart.  We will restart a low-dose of this and decrease her Norvasc as her systolic blood pressure continues to run on the low end, despite diastolic being slightly elevated. -Advised compression socks -Recommended a healthy diet and regular exercise, weight reduction -Discussed health maintenance measures due and she reports she is scheduled for her colonoscopy and her mammogram -Talked about may be having her see her cardiologist given her symptoms 3 and the quality with her blood pressures, she declined. -Close follow-up in 1 month to recheck blood pressure and her electrolytes.   -Patient advised to return or notify a doctor sooner if symptoms worsen or new concerns arise.  Patient Instructions  BEFORE YOU LEAVE: -follow up: 1 month  STOP the amlodipine 7.5 and take only 2.5 mg (1/2 tablet daily)  Start a low dose of the diuretic 12.5 mg and wear compression socks  Continue the Losartan 50mg  daily  Eat a healthy low sugar diet. Get regular exercise.  Follow up sooner if any concerns.   We recommend the following healthy lifestyle for LIFE: 1) Small portions. But, make sure to get regular (at least 3 per day), healthy meals and small healthy snacks if needed.  2) Eat a healthy clean diet.   TRY TO EAT: -at least 5-7 servings of low sugar, colorful, and nutrient rich vegetables per day (not corn, potatoes or bananas.) -berries are the best choice if you wish to eat fruit (only eat small amounts if trying to reduce weight)  -lean meets (fish, white meat of chicken or [place]) -vegan proteins for some meals - beans or tofu, whole grains, nuts and seeds -Replace bad fats with good fats - good fats include: fish, nuts and seeds, canola oil, olive oil -small amounts of low fat  or non fat dairy -small amounts of100 % whole grains - check the lables -drink plenty of water  AVOID: -SUGAR, sweets, anything with added sugar, corn syrup or sweeteners - must read labels as even foods advertised as "healthy" often are loaded with sugar -if you must have a sweetener, small amounts of stevia may be best -sweetened beverages and artificially sweetened beverages -simple starches (rice, bread, potatoes, pasta, chips, etc - small amounts of 100% whole grains are ok) -red meat, pork, butter -fried foods, fast food, processed food, excessive dairy, eggs and coconut.  3)Get at least 150 minutes of sweaty aerobic exercise per week.  4)Reduce stress - consider counseling, meditation and relaxation to balance other aspects of your life.      Lucretia Kern, DO

## 2017-10-14 ENCOUNTER — Ambulatory Visit (INDEPENDENT_AMBULATORY_CARE_PROVIDER_SITE_OTHER): Payer: 59 | Admitting: Family Medicine

## 2017-10-14 ENCOUNTER — Encounter: Payer: Self-pay | Admitting: Family Medicine

## 2017-10-14 VITALS — BP 102/80 | HR 74 | Temp 98.7°F | Ht 70.0 in | Wt 291.4 lb

## 2017-10-14 DIAGNOSIS — R600 Localized edema: Secondary | ICD-10-CM

## 2017-10-14 DIAGNOSIS — I10 Essential (primary) hypertension: Secondary | ICD-10-CM | POA: Diagnosis not present

## 2017-10-14 DIAGNOSIS — R55 Syncope and collapse: Secondary | ICD-10-CM

## 2017-10-14 DIAGNOSIS — E785 Hyperlipidemia, unspecified: Secondary | ICD-10-CM | POA: Diagnosis not present

## 2017-10-14 MED ORDER — HYDROCHLOROTHIAZIDE 12.5 MG PO CAPS: 12.5000 mg | ORAL_CAPSULE | Freq: Every day | ORAL | 3 refills | Status: DC

## 2017-10-14 MED ORDER — AMLODIPINE BESYLATE 2.5 MG PO TABS: 2.5000 mg | ORAL_TABLET | Freq: Every day | ORAL | 3 refills | Status: DC

## 2017-10-14 NOTE — Patient Instructions (Addendum)
BEFORE YOU LEAVE: -follow up: 1 month  STOP the amlodipine 7.5 and take only 2.5 mg (1/2 tablet daily)  Start a low dose of the diuretic 12.5 mg and wear compression socks  Continue the Losartan 50mg  daily  Eat a healthy low sugar diet. Get regular exercise.  Follow up sooner if any concerns.   We recommend the following healthy lifestyle for LIFE: 1) Small portions. But, make sure to get regular (at least 3 per day), healthy meals and small healthy snacks if needed.  2) Eat a healthy clean diet.   TRY TO EAT: -at least 5-7 servings of low sugar, colorful, and nutrient rich vegetables per day (not corn, potatoes or bananas.) -berries are the best choice if you wish to eat fruit (only eat small amounts if trying to reduce weight)  -lean meets (fish, white meat of chicken or [place]) -vegan proteins for some meals - beans or tofu, whole grains, nuts and seeds -Replace bad fats with good fats - good fats include: fish, nuts and seeds, canola oil, olive oil -small amounts of low fat or non fat dairy -small amounts of100 % whole grains - check the lables -drink plenty of water  AVOID: -SUGAR, sweets, anything with added sugar, corn syrup or sweeteners - must read labels as even foods advertised as "healthy" often are loaded with sugar -if you must have a sweetener, small amounts of stevia may be best -sweetened beverages and artificially sweetened beverages -simple starches (rice, bread, potatoes, pasta, chips, etc - small amounts of 100% whole grains are ok) -red meat, pork, butter -fried foods, fast food, processed food, excessive dairy, eggs and coconut.  3)Get at least 150 minutes of sweaty aerobic exercise per week.  4)Reduce stress - consider counseling, meditation and relaxation to balance other aspects of your life.

## 2017-10-16 ENCOUNTER — Ambulatory Visit (INDEPENDENT_AMBULATORY_CARE_PROVIDER_SITE_OTHER): Payer: 59 | Admitting: Family Medicine

## 2017-10-16 VITALS — BP 151/81 | HR 71 | Temp 98.1°F | Ht 70.0 in | Wt 279.0 lb

## 2017-10-16 DIAGNOSIS — Z9189 Other specified personal risk factors, not elsewhere classified: Secondary | ICD-10-CM | POA: Diagnosis not present

## 2017-10-16 DIAGNOSIS — I10 Essential (primary) hypertension: Secondary | ICD-10-CM

## 2017-10-16 DIAGNOSIS — Z6841 Body Mass Index (BMI) 40.0 and over, adult: Secondary | ICD-10-CM

## 2017-10-16 DIAGNOSIS — E559 Vitamin D deficiency, unspecified: Secondary | ICD-10-CM | POA: Diagnosis not present

## 2017-10-16 DIAGNOSIS — R7303 Prediabetes: Secondary | ICD-10-CM | POA: Diagnosis not present

## 2017-10-16 MED ORDER — METFORMIN HCL 500 MG PO TABS: 500.0000 mg | ORAL_TABLET | Freq: Every day | ORAL | 0 refills | Status: DC

## 2017-10-16 MED ORDER — VITAMIN D (ERGOCALCIFEROL) 1.25 MG (50000 UNIT) PO CAPS: 50000.0000 [IU] | ORAL_CAPSULE | ORAL | 0 refills | Status: DC

## 2017-10-17 NOTE — Progress Notes (Signed)
Office: [phone]  /  Fax: [phone]   HPI:   Chief Complaint: OBESITY Jamie [name] is here to discuss her progress with her obesity treatment plan. She is on the Category 2 plan + 100 calories and is following her eating plan approximately 0 % of the time. She states she is exercising 0 minutes 0 times per week. Jamie [name] had to wait to get paid to buy groceries. She passed out and had to stay overnight in the hospital.  Her weight is 279 lb (126.6 kg) today and has gained 6 pounds since her last visit. She has lost 0 lbs since starting treatment with [place].  Hypertension Jamie [name] is a 57 y.o. female with hypertension. Jamie [name] is on amlodipine and her blood pressure is not controlled today. She denies chest pain, chest pressure, or headaches. She is working weight loss to help control her blood pressure with the goal of decreasing her risk of heart attack and stroke.   Vitamin D Deficiency Jamie [name] has a diagnosis of vitamin D deficiency. She is not currently taking Vit D. She notes fatigue and denies nausea, vomiting or muscle weakness.  Pre-Diabetes Jamie [name] has a diagnosis of pre-diabetes based on her elevated Hgb A1c and was informed this puts her at greater risk of developing diabetes. She has had pre-diabetes for at least 5 years, she is not on medications, and she notes carbohydrate cravings. She continues to work on diet and exercise to decrease risk of diabetes. She denies hypoglycemia.  At risk for diabetes Jamie [name] is at higher than average risk for developing diabetes due to her obesity and pre-diabetes. She currently denies polyuria or polydipsia.  ALLERGIES: No Known Allergies  MEDICATIONS: Current Outpatient Medications on File Prior to Visit  Medication Sig Dispense Refill  . acetaminophen (TYLENOL) 500 MG tablet Take 2 tablets (1,000 mg total) by mouth at bedtime. 30 tablet 0  . amLODipine (NORVASC) 2.5 MG tablet Take 1 tablet (2.5 mg total) by mouth daily. 90 tablet 3  .  gabapentin (NEURONTIN) 300 MG capsule Neurontin 300 mg capsule  Take 1 capsule every day by oral route.  take 1 tab at bedtime    . GLUCOSAMINE-CHONDROITIN PO Take 1 tablet by mouth daily.    . hydrochlorothiazide (MICROZIDE) 12.5 MG capsule Take 1 capsule (12.5 mg total) by mouth daily. 30 capsule 3  . losartan (COZAAR) 50 MG tablet Take 1 tablet (50 mg total) by mouth daily. 30 tablet 0  . pravastatin (PRAVACHOL) 40 MG tablet TAKE 1 TABLET (40 MG TOTAL) BY MOUTH DAILY. 90 tablet 3  . ranitidine (ZANTAC) 150 MG tablet Take 150 mg by mouth 2 (two) times daily.     No current facility-administered medications on file prior to visit.     PAST MEDICAL HISTORY: Past Medical History:  Diagnosis Date  . Allergy   . Arthritis    in back  . Chest pain    neg cath 2016 after false positive myoview  . Chest pain   . Depression   . GERD (gastroesophageal reflux disease)   . High blood pressure   . Hyperlipidemia   . Hypertension   . Joint pain   . Knee pain   . Low back pain   . Migraine   . Mitral valve prolapse   . Muscle cramps   . Prediabetes     PAST SURGICAL HISTORY: Past Surgical History:  Procedure Laterality Date  . BACK SURGERY    . CESAREAN SECTION    .  LEFT HEART CATHETERIZATION WITH CORONARY ANGIOGRAM N/A 07/08/2013   Procedure: LEFT HEART CATHETERIZATION WITH CORONARY ANGIOGRAM;  Surgeon: Josue Hector, MD;  Location: [facility] CATH LAB;  Service: Cardiovascular;  Laterality: N/A;  . TUBAL LIGATION      SOCIAL HISTORY: Social History   Tobacco Use  . Smoking status: Former Smoker    Types: Cigarettes    Last attempt to quit: 07/31/1996    Years since quitting: 21.2  . Smokeless tobacco: Never Used  Substance Use Topics  . Alcohol use: Yes    Comment: very little  . Drug use: No    FAMILY HISTORY: Family History  Problem Relation Age of Onset  . Hyperlipidemia Mother   . Hypertension Mother   . AAA (abdominal aortic aneurysm) Mother   . Stroke Father   .  Hypertension Father   . Hyperlipidemia Brother        x2  . Hypertension Brother        x2  . Liver cancer Maternal Aunt     ROS: Review of Systems  Constitutional: Positive for malaise/fatigue. Negative for weight loss.  Cardiovascular: Negative for chest pain.       Negative chest pressure  Gastrointestinal: Negative for nausea and vomiting.  Genitourinary: Negative for frequency.  Musculoskeletal:       Negative muscle weakness  Neurological: Negative for headaches.  Endo/Heme/Allergies: Negative for polydipsia.       Negative hypoglycemia    PHYSICAL EXAM: Blood pressure (!) 151/81, pulse 71, temperature 98.1 F (36.7 C), temperature source Oral, height 5\' 10"  (1.778 m), weight 279 lb (126.6 kg), last menstrual period 10/04/2014, SpO2 98 %. Body mass index is 40.03 kg/m. Physical Exam  Constitutional: She is oriented to person, place, and time. She appears well-developed and well-nourished.  Cardiovascular: Normal rate.  Pulmonary/Chest: Effort normal.  Musculoskeletal: Normal range of motion.  Neurological: She is oriented to person, place, and time.  Skin: Skin is warm and dry.  Psychiatric: She has a normal mood and affect. Her behavior is normal.  Vitals reviewed.   RECENT LABS AND TESTS: BMET    Component Value Date/Time   NA 142 10/07/2017 0439   NA 142 10/01/2017 1150   K 3.5 10/07/2017 0439   CL 109 10/07/2017 0439   CO2 22 10/07/2017 0439   GLUCOSE 104 (H) 10/07/2017 0439   BUN 12 10/07/2017 0439   BUN 10 10/01/2017 1150   CREATININE 0.84 10/07/2017 0439   CALCIUM 8.4 (L) 10/07/2017 0439   GFRNONAA >60 10/07/2017 0439   GFRAA >60 10/07/2017 0439   Lab Results  Component Value Date   HGBA1C 6.0 07/11/2017   HGBA1C 6.0 10/11/2015   HGBA1C 6.2 01/17/2015   HGBA1C 5.9 08/04/2014   HGBA1C 6.2 04/06/2013   Lab Results  Component Value Date   INSULIN 10.5 10/01/2017   CBC    Component Value Date/Time   WBC 5.4 10/06/2017 1740   RBC 4.42  10/06/2017 1740   HGB 12.6 10/06/2017 1740   HCT 39.6 10/06/2017 1740   PLT 284 10/06/2017 1740   MCV 89.6 10/06/2017 1740   MCH 28.5 10/06/2017 1740   MCHC 31.8 10/06/2017 1740   RDW 14.8 10/06/2017 1740   LYMPHSABS 2.4 01/17/2015 1421   MONOABS 0.4 01/17/2015 1421   EOSABS 0.2 01/17/2015 1421   BASOSABS 0.0 01/17/2015 1421   Iron/TIBC/Ferritin/ %Sat No results found for: IRON, TIBC, FERRITIN, IRONPCTSAT Lipid Panel     Component Value Date/Time   CHOL 193  10/01/2017 1150   TRIG 107 10/01/2017 1150   HDL 51 10/01/2017 1150   CHOLHDL 3 10/11/2015 0956   VLDL 17.6 10/11/2015 0956   LDLCALC 121 (H) 10/01/2017 1150   Hepatic Function Panel     Component Value Date/Time   PROT 7.2 10/01/2017 1150   ALBUMIN 4.5 10/01/2017 1150   AST 12 10/01/2017 1150   ALT 16 10/01/2017 1150   ALKPHOS 71 10/01/2017 1150   BILITOT 0.4 10/01/2017 1150   BILIDIR 0.1 08/01/2010 1004      Component Value Date/Time   TSH 1.610 10/01/2017 1150   TSH 2.01 01/17/2015 1421   TSH 1.42 08/01/2010 1004  Results for MCCARTNEY, BRUCKS (MRN [id number]) as of 10/17/2017 12:26  Ref. Range 10/01/2017 11:50  Vitamin D, 25-Hydroxy Latest Ref Range: 30.0 - 100.0 ng/mL 10.9 (L)    ASSESSMENT AND PLAN: Essential hypertension  Vitamin D deficiency - Plan: Vitamin D, Ergocalciferol, (DRISDOL) 50000 units CAPS capsule  Prediabetes - Plan: metFORMIN (GLUCOPHAGE) 500 MG tablet  At risk for diabetes mellitus  Class 3 severe obesity with serious comorbidity and body mass index (BMI) of 40.0 to 44.9 in adult, unspecified obesity type ([place])  PLAN:  Hypertension We discussed sodium restriction, working on healthy weight loss, and a regular exercise program as the means to achieve improved blood pressure control. Jamie [name] agreed with this plan and agreed to follow up as directed. We will continue to monitor her blood pressure as well as her progress with the above lifestyle modifications. Jamie [name] agrees to continue her  current medications and will watch for signs of hypotension as she continues her lifestyle modifications. Jamie [name] agrees to follow up with our clinic in 2 weeks and we will recheck blood pressure at that time.  Vitamin D Deficiency Jamie [name] was informed that low vitamin D levels contributes to fatigue and are associated with obesity, breast, and colon cancer. Jamie [name] agrees to start prescription Vit D @50 ,000 IU every week #4 with no refills. She will follow up for routine testing of vitamin D, at least 2-3 times per year. She was informed of the risk of over-replacement of vitamin D and agrees to not increase her dose unless she discusses this with [place] first. Jamie [name] agrees to follow up with our clinic in 2 weeks.  Pre-Diabetes Jamie [name] will continue to work on weight loss, exercise, and decreasing simple carbohydrates in her diet to help decrease the risk of diabetes. We dicussed metformin including benefits and risks. She was informed that eating too many simple carbohydrates or too many calories at one sitting increases the likelihood of GI side effects. Jamie [name] agrees to start metformin 500 mg PO q AM #30 with no refills. Jamie [name] agrees to follow up with our clinic in 2 weeks as directed to monitor her progress.  Diabetes risk counselling Jamie [name] was given extended (30 minutes) diabetes prevention counseling today. She is 57 y.o. female and has risk factors for diabetes including obesity and pre-diabetes. We discussed intensive lifestyle modifications today with an emphasis on weight loss as well as increasing exercise and decreasing simple carbohydrates in her diet.  Obesity Jamie [name] is currently in the action stage of change. As such, her goal is to continue with weight loss efforts She has agreed to follow the Category 2 plan + 100 calories Jamie [name] has been instructed to work up to a goal of 150 minutes of combined cardio and strengthening exercise per week for weight loss and overall health benefits. We discussed  the following Behavioral Modification  Strategies today: increasing lean protein intake, increasing vegetables, work on meal planning and easy cooking plans, and planning for success   Jamie [name] has agreed to follow up with our clinic in 2 weeks. She was informed of the importance of frequent follow up visits to maximize her success with intensive lifestyle modifications for her multiple health conditions.   OBESITY BEHAVIORAL INTERVENTION VISIT  Today's visit was # 2   Starting weight: 273 lbs Starting date: 10/01/17 Today's weight : 279 lbs Today's date: 10/16/2017 Total lbs lost to date: 0 At least 15 minutes were spent on discussing the following behavioral intervention visit.   ASK: We discussed the diagnosis of obesity with Jamie [name] today and Jamie [name] agreed to give [place] permission to discuss obesity behavioral modification therapy today.  ASSESS: Jamie [name] has the diagnosis of obesity and her BMI today is 40.03 Jamie [name] is in the action stage of change   ADVISE: Jamie [name] was educated on the multiple health risks of obesity as well as the benefit of weight loss to improve her health. She was advised of the need for long term treatment and the importance of lifestyle modifications to improve her current health and to decrease her risk of future health problems.  AGREE: Multiple dietary modification options and treatment options were discussed and  Jamie [name] agreed to follow the recommendations documented in the above note.  ARRANGE: Jamie [name] was educated on the importance of frequent visits to treat obesity as outlined per CMS and USPSTF guidelines and agreed to schedule her next follow up appointment today.  I, Trixie Dredge, am acting as transcriptionist for Ilene Qua, MD  I have reviewed the above documentation for accuracy and completeness, and I agree with the above. - Ilene Qua, MD

## 2017-10-18 ENCOUNTER — Encounter: Payer: Self-pay | Admitting: Family Medicine

## 2017-10-29 ENCOUNTER — Other Ambulatory Visit: Payer: Self-pay

## 2017-10-29 ENCOUNTER — Ambulatory Visit (AMBULATORY_SURGERY_CENTER): Payer: Self-pay | Admitting: *Deleted

## 2017-10-29 VITALS — Ht 70.0 in | Wt 285.0 lb

## 2017-10-29 DIAGNOSIS — Z86010 Personal history of colonic polyps: Secondary | ICD-10-CM

## 2017-10-29 MED ORDER — SUPREP BOWEL PREP KIT 17.5-3.13-1.6 GM/177ML PO SOLN: 1.0000 | Freq: Once | ORAL | 0 refills | Status: AC

## 2017-10-29 NOTE — Progress Notes (Signed)
No egg or soy allergy known to patient  No issues with past sedation with any surgeries  or procedures, no intubation problems  No diet pills per patient No home 02 use per patient  No blood thinners per patient  Pt denies issues with constipation  No A fib or A flutter  EMMI video sent to pt's e mail  

## 2017-10-30 ENCOUNTER — Encounter: Payer: Self-pay | Admitting: Gastroenterology

## 2017-10-31 ENCOUNTER — Telehealth: Payer: Self-pay

## 2017-10-31 NOTE — Telephone Encounter (Signed)
New message    Just an FYI. We have made several attempts to contact this patient including sending a letter to schedule or reschedule their echocardiogram. We will be removing the patient from the echo WQ.   Thank you 

## 2017-11-04 ENCOUNTER — Ambulatory Visit
Admission: RE | Admit: 2017-11-04 | Discharge: 2017-11-04 | Disposition: A | Discharge status: Home / Self Care | Payer: 59 | Source: Ambulatory Visit | Attending: Obstetrics and Gynecology | Admitting: Obstetrics and Gynecology

## 2017-11-04 DIAGNOSIS — Z1231 Encounter for screening mammogram for malignant neoplasm of breast: Secondary | ICD-10-CM

## 2017-11-06 ENCOUNTER — Ambulatory Visit (INDEPENDENT_AMBULATORY_CARE_PROVIDER_SITE_OTHER): Payer: Self-pay | Admitting: Family Medicine

## 2017-11-06 ENCOUNTER — Encounter (INDEPENDENT_AMBULATORY_CARE_PROVIDER_SITE_OTHER): Payer: Self-pay

## 2017-11-07 ENCOUNTER — Encounter (INDEPENDENT_AMBULATORY_CARE_PROVIDER_SITE_OTHER): Payer: Self-pay

## 2017-11-11 ENCOUNTER — Ambulatory Visit (AMBULATORY_SURGERY_CENTER): Payer: 59 | Admitting: Gastroenterology

## 2017-11-11 ENCOUNTER — Encounter: Payer: Self-pay | Admitting: Gastroenterology

## 2017-11-11 VITALS — BP 138/82 | HR 62 | Temp 96.6°F | Resp 12 | Ht 70.0 in | Wt 285.0 lb

## 2017-11-11 DIAGNOSIS — Z86010 Personal history of colonic polyps: Secondary | ICD-10-CM | POA: Diagnosis not present

## 2017-11-11 DIAGNOSIS — M48061 Spinal stenosis, lumbar region without neurogenic claudication: Secondary | ICD-10-CM | POA: Diagnosis not present

## 2017-11-11 DIAGNOSIS — I10 Essential (primary) hypertension: Secondary | ICD-10-CM | POA: Diagnosis not present

## 2017-11-11 DIAGNOSIS — D124 Benign neoplasm of descending colon: Secondary | ICD-10-CM

## 2017-11-11 DIAGNOSIS — M5136 Other intervertebral disc degeneration, lumbar region: Secondary | ICD-10-CM | POA: Diagnosis not present

## 2017-11-11 DIAGNOSIS — K219 Gastro-esophageal reflux disease without esophagitis: Secondary | ICD-10-CM | POA: Diagnosis not present

## 2017-11-11 DIAGNOSIS — D123 Benign neoplasm of transverse colon: Secondary | ICD-10-CM | POA: Diagnosis not present

## 2017-11-11 DIAGNOSIS — M545 Low back pain: Secondary | ICD-10-CM | POA: Diagnosis not present

## 2017-11-11 MED ORDER — SODIUM CHLORIDE 0.9 % IV SOLN: 500.0000 mL | Freq: Once | INTRAVENOUS | Status: DC

## 2017-11-11 NOTE — Progress Notes (Signed)
Called to room to assist during endoscopic procedure.  Patient ID and intended procedure confirmed with present staff. Received instructions for my participation in the procedure from the performing physician.  

## 2017-11-11 NOTE — Op Note (Signed)
[facility] Patient Name: Jamie [name] Procedure Date: 11/11/2017 10:51 AM MRN: [id number] Endoscopist: Ladene Artist , MD Age: 57 Referring MD:  Date of Birth: 04/02/1960 Gender: Female Account #: [id number] Procedure:                Colonoscopy Indications:              Surveillance: Personal history of adenomatous                            polyps on last colonoscopy 5 years ago Medicines:                Monitored Anesthesia Care Procedure:                Pre-Anesthesia Assessment:                           - Prior to the procedure, a History and Physical                            was performed, and patient medications and                            allergies were reviewed. The patient's tolerance of                            previous anesthesia was also reviewed. The risks                            and benefits of the procedure and the sedation                            options and risks were discussed with the patient.                            All questions were answered, and informed consent                            was obtained. Prior Anticoagulants: The patient has                            taken no previous anticoagulant or antiplatelet                            agents. ASA Grade Assessment: II - A patient with                            mild systemic disease. After reviewing the risks                            and benefits, the patient was deemed in                            satisfactory condition to undergo the procedure.  After obtaining informed consent, the colonoscope                            was passed under direct vision. Throughout the                            procedure, the patient's blood pressure, pulse, and                            oxygen saturations were monitored continuously. The                            Colonoscope was introduced through the anus and                            advanced to the the cecum,  identified by                            appendiceal orifice and ileocecal valve. The                            ileocecal valve, appendiceal orifice, and rectum                            were photographed. The quality of the bowel                            preparation was good. The colonoscopy was performed                            without difficulty. The patient tolerated the                            procedure well. Scope In: 10:57:40 AM Scope Out: 11:19:29 AM Scope Withdrawal Time: 0 hours 17 minutes 37 seconds  Total Procedure Duration: 0 hours 21 minutes 49 seconds  Findings:                 The perianal and digital rectal examinations were                            normal.                           Two sessile polyps were found in the descending                            colon and transverse colon. The polyps were 7 to 8                            mm in size. These polyps were removed with a cold                            snare. Resection and retrieval were complete.  Internal hemorrhoids were found during                            retroflexion. The hemorrhoids were small and Grade                            I (internal hemorrhoids that do not prolapse).                           The exam was otherwise without abnormality on                            direct and retroflexion views. Complications:            No immediate complications. Estimated blood loss:                            None. Estimated Blood Loss:     Estimated blood loss: none. Impression:               - Two 7 to 8 mm polyps in the descending colon and                            in the transverse colon, removed with a cold snare.                            Resected and retrieved.                           - Internal hemorrhoids.                           - The examination was otherwise normal on direct                            and retroflexion views. Recommendation:           -  Repeat colonoscopy in 5 years for surveillance.                           - Patient has a contact number available for                            emergencies. The signs and symptoms of potential                            delayed complications were discussed with the                            patient. Return to normal activities tomorrow.                            Written discharge instructions were provided to the                            patient.                           -  Resume previous diet.                           - Continue present medications.                           - Await pathology results. Ladene Artist, MD 11/11/2017 11:22:21 AM This report has been signed electronically.

## 2017-11-11 NOTE — Patient Instructions (Signed)
   Information on polyps and hemorrhoids given to you today    Await pathology results on polyps removed    YOU HAD AN ENDOSCOPIC PROCEDURE TODAY AT [facility]:   Refer to the procedure report that was given to you for any specific questions about what was found during the examination.  If the procedure report does not answer your questions, please call your gastroenterologist to clarify.  If you requested that your care partner not be given the details of your procedure findings, then the procedure report has been included in a sealed envelope for you to review at your convenience later.  YOU SHOULD EXPECT: Some feelings of bloating in the abdomen. Passage of more gas than usual.  Walking can help get rid of the air that was put into your GI tract during the procedure and reduce the bloating. If you had a lower endoscopy (such as a colonoscopy or flexible sigmoidoscopy) you may notice spotting of blood in your stool or on the toilet paper. If you underwent a bowel prep for your procedure, you may not have a normal bowel movement for a few days.  Please Note:  You might notice some irritation and congestion in your nose or some drainage.  This is from the oxygen used during your procedure.  There is no need for concern and it should clear up in a day or so.  SYMPTOMS TO REPORT IMMEDIATELY:   Following lower endoscopy (colonoscopy or flexible sigmoidoscopy):  Excessive amounts of blood in the stool  Significant tenderness or worsening of abdominal pains  Swelling of the abdomen that is new, acute  Fever of 100F or higher    For urgent or emergent issues, a gastroenterologist can be reached at any hour by calling [phone].   DIET:  We do recommend a small meal at first, but then you may proceed to your regular diet.  Drink plenty of fluids but you should avoid alcoholic beverages for 24 hours.  ACTIVITY:  You should plan to take it easy for the rest of today and  you should NOT DRIVE or use heavy machinery until tomorrow (because of the sedation medicines used during the test).    FOLLOW UP: Our staff will call the number listed on your records the next business day following your procedure to check on you and address any questions or concerns that you may have regarding the information given to you following your procedure. If we do not reach you, we will leave a message.  However, if you are feeling well and you are not experiencing any problems, there is no need to return our call.  We will assume that you have returned to your regular daily activities without incident.  If any biopsies were taken you will be contacted by phone or by letter within the next 1-3 weeks.  Please call us at [phone] if you have not heard about the biopsies in 3 weeks.    SIGNATURES/CONFIDENTIALITY: You and/or your care partner have signed paperwork which will be entered into your electronic medical record.  These signatures attest to the fact that that the information above on your After Visit Summary has been reviewed and is understood.  Full responsibility of the confidentiality of this discharge information lies with you and/or your care-partner.

## 2017-11-11 NOTE — Progress Notes (Signed)
Report given to PACU, vss 

## 2017-11-12 ENCOUNTER — Telehealth: Payer: Self-pay | Admitting: *Deleted

## 2017-11-12 NOTE — Telephone Encounter (Signed)
  Follow up Call-  Call back number 11/11/2017  Post procedure Call Back phone  # [phone]  Permission to leave phone message Yes  Some recent data might be hidden     Patient questions:  Do you have a fever, pain , or abdominal swelling? No. Pain Score  0 *  Have you tolerated food without any problems? Yes.    Have you been able to return to your normal activities? Yes.    Do you have any questions about your discharge instructions: Diet   No. Medications  No. Follow up visit  No.  Do you have questions or concerns about your Care? No.  Actions: * If pain score is 4 or above: No action needed, pain <4.

## 2017-11-18 ENCOUNTER — Ambulatory Visit (INDEPENDENT_AMBULATORY_CARE_PROVIDER_SITE_OTHER): Payer: 59 | Admitting: Family Medicine

## 2017-11-18 ENCOUNTER — Encounter: Payer: Self-pay | Admitting: Family Medicine

## 2017-11-18 ENCOUNTER — Ambulatory Visit: Payer: Self-pay | Admitting: *Deleted

## 2017-11-18 VITALS — BP 160/90 | HR 84 | Temp 98.9°F | Resp 12 | Wt 289.8 lb

## 2017-11-18 DIAGNOSIS — I10 Essential (primary) hypertension: Secondary | ICD-10-CM | POA: Diagnosis not present

## 2017-11-18 DIAGNOSIS — M5136 Other intervertebral disc degeneration, lumbar region: Secondary | ICD-10-CM | POA: Diagnosis not present

## 2017-11-18 DIAGNOSIS — M48061 Spinal stenosis, lumbar region without neurogenic claudication: Secondary | ICD-10-CM | POA: Diagnosis not present

## 2017-11-18 DIAGNOSIS — I872 Venous insufficiency (chronic) (peripheral): Secondary | ICD-10-CM | POA: Diagnosis not present

## 2017-11-18 DIAGNOSIS — R600 Localized edema: Secondary | ICD-10-CM | POA: Diagnosis not present

## 2017-11-18 DIAGNOSIS — M5416 Radiculopathy, lumbar region: Secondary | ICD-10-CM | POA: Diagnosis not present

## 2017-11-18 MED ORDER — LOSARTAN POTASSIUM 50 MG PO TABS: 50.0000 mg | ORAL_TABLET | Freq: Every day | ORAL | 0 refills | Status: DC

## 2017-11-18 MED ORDER — TRIAMCINOLONE ACETONIDE 0.1 % EX CREA: 1.0000 "application " | TOPICAL_CREAM | Freq: Every day | CUTANEOUS | 0 refills | Status: DC | PRN

## 2017-11-18 MED ORDER — FUROSEMIDE 20 MG PO TABS: 20.0000 mg | ORAL_TABLET | Freq: Two times a day (BID) | ORAL | 0 refills | Status: DC

## 2017-11-18 NOTE — Telephone Encounter (Signed)
Patient scheduled for an appt today with Dr [place] at 2:45pm.

## 2017-11-18 NOTE — Progress Notes (Signed)
ACUTE VISIT   HPI:  Chief Complaint  Patient presents with  . Edema    Pt present for edma in both legs more in the right.     Jamie [name] is a 57 y.o. female, who is here today complaining of worsening lower extremity edema for the past 4 days. She has had lower extremity edema for a while, it seems to be worse at the end of the day after prolonged walking/standing. She is not sure if it is resolved or alleviated with elevation.  According to patient, today at her orthopedist's office she was told to see her PCP. Skin "little" warm.  She denies chills or fever. She has noted erythematous rash on right distal leg,midly pruritic.   She is supposed to be wearing compression stockings but has not been able to put it on due to edema. Bilateral lower extremity aching, no more than usual. She denies recent trauma, surgery, hormonal therapy, or travel.  She mentions that she has been sleeping on a recliner for the past few nights due to lower back pain.  She denies orthopnea or PND.  Noted BP to be elevated.  Hypertension, she is not taking losartan 50 mg. Currently she is on HCTZ 12.5 mg daily and amlodipine 2.5 mg daily.  Lab Results  Component Value Date   CREATININE 0.84 10/07/2017   BUN 12 10/07/2017   NA 142 10/07/2017   K 3.5 10/07/2017   CL 109 10/07/2017   CO2 22 10/07/2017   According to patient, antihypertensive medication has been adjusted a couple times after ER visit due to syncopal episode. Denies visual changes, chest pain, dyspnea, palpitation, claudication,or focal weakness. Bitemporal and occipital headache for a while and stable.   Review of Systems  Constitutional: Negative for activity change, appetite change, fatigue and fever.  HENT: Negative for mouth sores, nosebleeds and trouble swallowing.   Eyes: Negative for redness and visual disturbance.  Respiratory: Negative for cough, shortness of breath and wheezing.   Cardiovascular:  Positive for leg swelling. Negative for chest pain and palpitations.  Gastrointestinal: Negative for abdominal pain, nausea and vomiting.       Negative for changes in bowel habits.  Genitourinary: Negative for decreased urine volume, dysuria and hematuria.  Musculoskeletal: Positive for back pain. Negative for gait problem.  Skin: Positive for rash. Negative for wound.  Neurological: Negative for syncope, weakness, numbness and headaches.      Current Outpatient Medications on File Prior to Visit  Medication Sig Dispense Refill  . acetaminophen (TYLENOL) 500 MG tablet Take 2 tablets (1,000 mg total) by mouth at bedtime. 30 tablet 0  . amLODipine (NORVASC) 2.5 MG tablet Take 1 tablet (2.5 mg total) by mouth daily. 90 tablet 3  . gabapentin (NEURONTIN) 300 MG capsule Neurontin 300 mg capsule  Take 1 capsule every day by oral route.  take 1 tab at bedtime    . hydrochlorothiazide (MICROZIDE) 12.5 MG capsule Take 1 capsule (12.5 mg total) by mouth daily. 30 capsule 3  . metFORMIN (GLUCOPHAGE) 500 MG tablet Take 1 tablet (500 mg total) by mouth daily with breakfast. 30 tablet 0  . pravastatin (PRAVACHOL) 40 MG tablet TAKE 1 TABLET (40 MG TOTAL) BY MOUTH DAILY. 90 tablet 3  . ranitidine (ZANTAC) 150 MG tablet Take 150 mg by mouth 2 (two) times daily.    . Vitamin D, Ergocalciferol, (DRISDOL) 50000 units CAPS capsule Take 1 capsule (50,000 Units total) by mouth every 7 (seven)  days. 4 capsule 0  . GLUCOSAMINE-CHONDROITIN PO Take 1 tablet by mouth daily.     Current Facility-Administered Medications on File Prior to Visit  Medication Dose Route Frequency Provider Last Rate Last Dose  . 0.9 %  sodium chloride infusion  500 mL Intravenous Once Ladene Artist, MD         Past Medical History:  Diagnosis Date  . Allergy   . Arthritis    in back  . Chest pain    neg cath 2016 after false positive myoview  . Chest pain   . Depression   . Diabetes mellitus without complication (HCC)     prediabetes  . GERD (gastroesophageal reflux disease)   . High blood pressure   . Hyperlipidemia   . Hypertension   . Joint pain   . Knee pain   . Low back pain   . Migraine   . Mitral valve prolapse   . Muscle cramps   . Prediabetes    No Known Allergies  Social History   Socioeconomic History  . Marital status: Married    Spouse name: Arnesha Schiraldi  . Number of children: 1  . Years of education: Not on file  . Highest education level: Not on file  Occupational History    Employer: [employer]  . Financial resource strain: Not on file  . Food insecurity:    Worry: Not on file    Inability: Not on file  . Transportation needs:    Medical: Not on file    Non-medical: Not on file  Tobacco Use  . Smoking status: Former Smoker    Types: Cigarettes    Last attempt to quit: 07/31/1996    Years since quitting: 21.3  . Smokeless tobacco: Never Used  Substance and Sexual Activity  . Alcohol use: Yes    Comment: very little  . Drug use: No  . Sexual activity: Not on file  Lifestyle  . Physical activity:    Days per week: Not on file    Minutes per session: Not on file  . Stress: Not on file  Relationships  . Social connections:    Talks on phone: Not on file    Gets together: Not on file    Attends religious service: Not on file    Active member of club or organization: Not on file    Attends meetings of clubs or organizations: Not on file    Relationship status: Not on file  Other Topics Concern  . Not on file  Social History Narrative  . Not on file    Vitals:   11/18/17 1454  BP: (!) 160/90  Pulse: 84  Resp: 12  Temp: 98.9 F (37.2 C)  SpO2: 97%   Body mass index is 41.58 kg/m.   Physical Exam  Nursing note and vitals reviewed. Constitutional: She is oriented to person, place, and time. She appears well-developed. No distress.  HENT:  Head: Normocephalic and atraumatic.  Mouth/Throat: Oropharynx is clear and moist and mucous  membranes are normal.  Eyes: Pupils are equal, round, and reactive to light. Conjunctivae are normal.  Cardiovascular: Normal rate and regular rhythm.  No murmur heard. Pulses:      Dorsalis pedis pulses are 2+ on the right side, and 2+ on the left side.  Small varicose veins.   Respiratory: Effort normal and breath sounds normal. No respiratory distress.  GI: Soft. She exhibits no mass. There is no hepatomegaly. There  is no tenderness.  Musculoskeletal: She exhibits edema (2+ 3+ LE pitting edema,bilateral.). She exhibits no tenderness.  Lymphadenopathy:    She has no cervical adenopathy.  Neurological: She is alert and oriented to person, place, and time. She has normal strength. No cranial nerve deficit. Gait normal.  Skin: Skin is warm. Rash noted. No erythema.  Micropapular erythematous rash on medial aspect of RLE, above ankle.   Psychiatric: She has a normal mood and affect.  Well groomed, good eye contact.      ASSESSMENT AND PLAN:   Ms. Rhonna was seen today for edema.  Diagnoses and all orders for this visit:  Bilateral lower extremity edema Possible etiologies discussed. Side effects of diuretic treatment reviewed,recommend 5 days treatment. LE elevation. Instructed about warning signs. F/U with PCP in 3-4 weeks.  -     furosemide (LASIX) 20 MG tablet; Take 1 tablet (20 mg total) by mouth 2 (two) times daily for 5 days.  Venous stasis dermatitis of both lower extremities Compression stockings to wear daily. Adequate skin care. Topical steroid daily as needed.  -     triamcinolone cream (KENALOG) 0.1 %; Apply 1 application topically daily as needed.  Hypertension, unspecified type  Not well controlled. Possible complications of elevated BP discussed. Recommend monitoring BP periodically. Low salt diet. She was instructed to start Losartan 50 mg. No changes in Amlodipine or HCTZ. Spironolactone can be considered if BP is not at goal.  -     losartan  (COZAAR) 50 MG tablet; Take 1 tablet (50 mg total) by mouth daily.      Return in about 3 weeks (around 12/09/2017) for HTN PCP.        Betty G. Martinique, MD  [facility]. [place] office.

## 2017-11-18 NOTE — Telephone Encounter (Signed)
Pt called with having bilateral leg swelling. That has been going on for a while. Her legs would swell and go down but now they are not going down. She noticed this last Thursday. She can leave a finger print when she pushes into her legs. The swelling is from the knee down to her toes. Wearing sneakers today and feet hurt. She also noticed what she thinks are sores on her legs, red dots.  The right leg is worst than the left leg. Legs feels hot to touch but she does not have a fever.  No chest pain or shortness of breath. Appointment scheduled for today with a provider. Pt will call back for any concerns.  Routing to flow at [facility] [place].  Reason for Disposition . SEVERE leg swelling (e.g., swelling extends above knee, entire leg is swollen, weeping fluid)  Answer Assessment - Initial Assessment Questions 1. ONSET: "When did the swelling start?" (e.g., minutes, hours, days)     Since Thursday 2. LOCATION: "What part of the leg is swollen?"  "Are both legs swollen or just one leg?"     From the knee down to her toes 3. SEVERITY: "How bad is the swelling?" (e.g., localized; mild, moderate, severe)  - Localized - small area of swelling localized to one leg  - MILD pedal edema - swelling limited to foot and ankle, pitting edema < 1/4 inch (6 mm) deep, rest and elevation eliminate most or all swelling  - MODERATE edema - swelling of lower leg to knee, pitting edema > 1/4 inch (6 mm) deep, rest and elevation only partially reduce swelling  - SEVERE edema - swelling extends above knee, facial or hand swelling present      moderate 4. REDNESS: "Does the swelling look red or infected?"     Redness, sores 5. PAIN: "Is the swelling painful to touch?" If so, ask: "How painful is it?"   (Scale 1-10; mild, moderate or severe)     Pain # 6 or 7 6. FEVER: "Do you have a fever?" If so, ask: "What is it, how was it measured, and when did it start?"      no 7. CAUSE: "What do you think is causing the  leg swelling?"     Not sure 8. MEDICAL HISTORY: "Do you have a history of heart failure, kidney disease, liver failure, or cancer?"     no 9. RECURRENT SYMPTOM: "Have you had leg swelling before?" If so, ask: "When was the last time?" "What happened that time?"     Leg swelling has been going on for a while 10. OTHER SYMPTOMS: "Do you have any other symptoms?" (e.g., chest pain, difficulty breathing)       no 11. PREGNANCY: "Is there any chance you are pregnant?" "When was your last menstrual period?"       No periods  Protocols used: LEG SWELLING AND EDEMA-A-AH

## 2017-11-18 NOTE — Patient Instructions (Signed)
A few things to remember from today's visit:   Venous stasis dermatitis of both lower extremities  Bilateral lower extremity edema  Hypertension, unspecified type   Chronic Venous Insufficiency Chronic venous insufficiency, also called venous stasis, is a condition that prevents blood from being pumped effectively through the veins in your legs. Blood may no longer be pumped effectively from the legs back to the heart. This condition can range from mild to severe. With proper treatment, you should be able to continue with an active life. What are the causes? Chronic venous insufficiency occurs when the vein walls become stretched, weakened, or damaged, or when valves within the vein are damaged. Some common causes of this include:  High blood pressure inside the veins (venous hypertension).  Increased blood pressure in the leg veins from long periods of sitting or standing.  A blood clot that blocks blood flow in a vein (deep vein thrombosis, DVT).  Inflammation of a vein (phlebitis) that causes a blood clot to form.  Tumors in the pelvis that cause blood to back up.  What increases the risk? The following factors may make you more likely to develop this condition:  Having a family history of this condition.  Obesity.  Pregnancy.  Living without enough physical activity or exercise (sedentary lifestyle).  Smoking.  Having a job that requires long periods of standing or sitting in one place.  Being a certain age. Women in their 9s and 29s and men in their 13s are more likely to develop this condition.  What are the signs or symptoms? Symptoms of this condition include:  Veins that are enlarged, bulging, or twisted (varicose veins).  Skin breakdown or ulcers.  Reddened or discolored skin on the front of the leg.  Brown, smooth, tight, and painful skin just above the ankle, usually on the inside of the leg (lipodermatosclerosis).  Swelling.  How is this  diagnosed? This condition may be diagnosed based on:  Your medical history.  A physical exam.  Tests, such as: ? A procedure that creates an image of a blood vessel and nearby organs and provides information about blood flow through the blood vessel (duplex ultrasound). ? A procedure that tests blood flow (plethysmography). ? A procedure to look at the veins using X-ray and dye (venogram).  How is this treated? The goals of treatment are to help you return to an active life and to minimize pain or disability. Treatment depends on the severity of your condition, and it may include:  Wearing compression stockings. These can help relieve symptoms and help prevent your condition from getting worse. However, they do not cure the condition.  Sclerotherapy. This is a procedure involving an injection of a material that "dissolves" damaged veins.  Surgery. This may involve: ? Removing a diseased vein (vein stripping). ? Cutting off blood flow through the vein (laser ablation surgery). ? Repairing a valve.  Follow these instructions at home:  Wear compression stockings as told by your health care provider. These stockings help to prevent blood clots and reduce swelling in your legs.  Take over-the-counter and prescription medicines only as told by your health care provider.  Stay active by exercising, walking, or doing different activities. Ask your health care provider what activities are safe for you and how much exercise you need.  Drink enough fluid to keep your urine clear or pale yellow.  Do not use any products that contain nicotine or tobacco, such as cigarettes and e-cigarettes. If you need help quitting,  ask your health care provider.  Keep all follow-up visits as told by your health care provider. This is important. Contact a health care provider if:  You have redness, swelling, or more pain in the affected area.  You see a red streak or line that extends up or down from the  affected area.  You have skin breakdown or a loss of skin in the affected area, even if the breakdown is small.  You get an injury in the affected area. Get help right away if:  You get an injury and an open wound in the affected area.  You have severe pain that does not get better with medicine.  You have sudden numbness or weakness in the foot or ankle below the affected area, or you have trouble moving your foot or ankle.  You have a fever and you have worse or persistent symptoms.  You have chest pain.  You have shortness of breath. Summary  Chronic venous insufficiency, also called venous stasis, is a condition that prevents blood from being pumped effectively through the veins in your legs.  Chronic venous insufficiency occurs when the vein walls become stretched, weakened, or damaged, or when valves within the vein are damaged.  Treatment for this condition depends on how severe your condition is, and it may involve wearing compression stockings or having a procedure.  Make sure you stay active by exercising, walking, or doing different activities. Ask your health care provider what activities are safe for you and how much exercise you need. This information is not intended to replace advice given to you by your health care provider. Make sure you discuss any questions you have with your health care provider. Document Released: 04/23/2006 Document Revised: 11/07/2015 Document Reviewed: 11/07/2015 Elsevier Interactive Patient Education  2017 [employer].  Please be sure medication list is accurate. If a new problem present, please set up appointment sooner than planned today.

## 2017-11-28 ENCOUNTER — Emergency Department (HOSPITAL_COMMUNITY): Payer: 59

## 2017-11-28 ENCOUNTER — Emergency Department (HOSPITAL_BASED_OUTPATIENT_CLINIC_OR_DEPARTMENT_OTHER): Payer: 59

## 2017-11-28 ENCOUNTER — Other Ambulatory Visit: Payer: Self-pay

## 2017-11-28 ENCOUNTER — Emergency Department (HOSPITAL_COMMUNITY)
Admission: EM | Admit: 2017-11-28 | Discharge: 2017-11-28 | Disposition: A | Discharge status: Home / Self Care | Payer: 59 | Attending: Emergency Medicine | Admitting: Emergency Medicine

## 2017-11-28 ENCOUNTER — Encounter (HOSPITAL_COMMUNITY): Payer: Self-pay | Admitting: Emergency Medicine

## 2017-11-28 DIAGNOSIS — Z87891 Personal history of nicotine dependence: Secondary | ICD-10-CM | POA: Insufficient documentation

## 2017-11-28 DIAGNOSIS — I10 Essential (primary) hypertension: Secondary | ICD-10-CM | POA: Diagnosis not present

## 2017-11-28 DIAGNOSIS — R0789 Other chest pain: Secondary | ICD-10-CM | POA: Diagnosis not present

## 2017-11-28 DIAGNOSIS — M7989 Other specified soft tissue disorders: Secondary | ICD-10-CM

## 2017-11-28 DIAGNOSIS — E119 Type 2 diabetes mellitus without complications: Secondary | ICD-10-CM | POA: Diagnosis not present

## 2017-11-28 DIAGNOSIS — R600 Localized edema: Secondary | ICD-10-CM | POA: Diagnosis not present

## 2017-11-28 DIAGNOSIS — R079 Chest pain, unspecified: Secondary | ICD-10-CM | POA: Diagnosis not present

## 2017-11-28 DIAGNOSIS — Z7984 Long term (current) use of oral hypoglycemic drugs: Secondary | ICD-10-CM | POA: Insufficient documentation

## 2017-11-28 DIAGNOSIS — R202 Paresthesia of skin: Secondary | ICD-10-CM | POA: Insufficient documentation

## 2017-11-28 LAB — BASIC METABOLIC PANEL
Anion gap: 5 (ref 5–15)
BUN: 11 mg/dL (ref 6–20)
CALCIUM: 9.2 mg/dL (ref 8.9–10.3)
CO2: 30 mmol/L (ref 22–32)
CREATININE: 0.87 mg/dL (ref 0.44–1.00)
Chloride: 106 mmol/L (ref 98–111)
GFR calc Af Amer: >60 mL/min (ref >60)
GFR calc non Af Amer: >60 mL/min (ref >60)
Glucose, Bld: 86 mg/dL (ref 70–99)
Potassium: 3.5 mmol/L (ref 3.5–5.1)
SODIUM: 141 mmol/L (ref 135–145)

## 2017-11-28 LAB — D-DIMER, QUANTITATIVE: D-Dimer, Quant: 0.54 ug/mL-FEU — ABNORMAL HIGH (ref 0.00–0.50)

## 2017-11-28 LAB — I-STAT TROPONIN, ED: Troponin i, poc: 0 ng/mL (ref 0.00–0.08)

## 2017-11-28 LAB — I-STAT BETA HCG BLOOD, ED (MC, WL, AP ONLY): I-stat hCG, quantitative: <5 m[IU]/mL (ref <5)

## 2017-11-28 LAB — CBC
HCT: 39.2 % (ref 36.0–46.0)
Hemoglobin: 12.3 g/dL (ref 12.0–15.0)
MCH: 29.1 pg (ref 26.0–34.0)
MCHC: 31.4 g/dL (ref 30.0–36.0)
MCV: 92.9 fL (ref 80.0–100.0)
NRBC: 0 % (ref 0.0–0.2)
PLATELETS: 245 10*3/uL (ref 150–400)
RBC: 4.22 MIL/uL (ref 3.87–5.11)
RDW: 14.7 % (ref 11.5–15.5)
WBC: 5 10*3/uL (ref 4.0–10.5)

## 2017-11-28 MED ORDER — MORPHINE SULFATE (PF) 4 MG/ML IV SOLN
4.0000 mg | Freq: Once | INTRAVENOUS | Status: AC
  Administered 2017-11-28: 4 mg via INTRAVENOUS
  Filled 2017-11-28: qty 1

## 2017-11-28 NOTE — ED Provider Notes (Signed)
[facility] DEPT Provider Note   CSN: [id number] Arrival date & time: 11/28/17  0762     History   Chief Complaint Chief Complaint  Patient presents with  . Chest Pain    HPI Jamie [name] is a 57 y.o. female.  Left chest pain since yesterday described as a tightness.  No dyspnea, diaphoresis, nausea.  She has tried Tylenol and Pepto-Bismol with minimal success.  Family history negative for early cardiac death.  Review of systems positive for tingling of the left hand and swelling of the right lower extremity.  Past medical history includes hypertension, diabetes, hyperlipidemia, obesity.  Severity of symptoms is mild to moderate.  Palpation makes symptoms worse.     Past Medical History:  Diagnosis Date  . Allergy   . Arthritis    in back  . Chest pain    neg cath 2016 after false positive myoview  . Chest pain   . Depression   . Diabetes mellitus without complication (HCC)    prediabetes  . GERD (gastroesophageal reflux disease)   . High blood pressure   . Hyperlipidemia   . Hypertension   . Joint pain   . Knee pain   . Low back pain   . Migraine   . Mitral valve prolapse   . Muscle cramps   . Prediabetes     Patient Active Problem List   Diagnosis Date Noted  . Syncope 10/06/2017  . Other fatigue 10/01/2017  . Shortness of breath on exertion 10/01/2017  . Hyperlipemia 05/06/2015  . GERD (gastroesophageal reflux disease) 08/01/2010  . Hypertension 01/20/2007  . Obesity 09/05/2006  . Migraine headache 09/05/2006    Past Surgical History:  Procedure Laterality Date  . BACK SURGERY    . CESAREAN SECTION    . LEFT HEART CATHETERIZATION WITH CORONARY ANGIOGRAM N/A 07/08/2013   Procedure: LEFT HEART CATHETERIZATION WITH CORONARY ANGIOGRAM;  Surgeon: Josue Hector, MD;  Location: [facility] CATH LAB;  Service: Cardiovascular;  Laterality: N/A;  . TUBAL LIGATION       OB History    Gravida  1   Para  1   Term      Preterm     AB      Living  1     SAB      TAB      Ectopic      Multiple      Live Births               Home Medications    Prior to Admission medications   Medication Sig Start Date End Date Taking? Authorizing Provider  acetaminophen (TYLENOL) 500 MG tablet Take 2 tablets (1,000 mg total) by mouth at bedtime. 10/07/17  Yes Patrecia Pour, Christean Grief, MD  amLODipine (NORVASC) 2.5 MG tablet Take 1 tablet (2.5 mg total) by mouth daily. 10/14/17  Yes Colin Benton R, DO  bismuth subsalicylate (PEPTO BISMOL) 262 MG/15ML suspension Take 30 mLs by mouth daily as needed for indigestion.   Yes [provider]  diclofenac sodium (VOLTAREN) 1 % GEL Apply 2 g topically 4 (four) times daily as needed (pain).   Yes [provider]  gabapentin (NEURONTIN) 300 MG capsule Take 300-600 mg by mouth at bedtime.    Yes [provider]  hydrochlorothiazide (MICROZIDE) 12.5 MG capsule Take 1 capsule (12.5 mg total) by mouth daily. 10/14/17  Yes Colin Benton R, DO  magnesium 30 MG tablet Take 30 mg by mouth 2 (  two) times daily.   Yes [provider]  OVER THE COUNTER MEDICATION Apply 1 application topically daily as needed (pain). Red Oil   Yes [provider]  ranitidine (ZANTAC) 150 MG tablet Take 150 mg by mouth 2 (two) times daily.   Yes [provider]  triamcinolone cream (KENALOG) 0.1 % Apply 1 application topically daily as needed. 11/18/17  Yes [place], Betty G, MD  VITAMIN D PO Take 1 tablet by mouth daily.   Yes [provider]  furosemide (LASIX) 20 MG tablet Take 1 tablet (20 mg total) by mouth 2 (two) times daily for 5 days. 11/18/17 11/23/17  [place], Betty G, MD  losartan (COZAAR) 50 MG tablet Take 1 tablet (50 mg total) by mouth daily. Patient not taking: Reported on 11/28/2017 11/18/17   [place], Betty G, MD  metFORMIN (GLUCOPHAGE) 500 MG tablet Take 1 tablet (500 mg total) by mouth daily with breakfast. Patient not taking: Reported on  11/28/2017 10/16/17   Eber Jones, MD  pravastatin (PRAVACHOL) 40 MG tablet TAKE 1 TABLET (40 MG TOTAL) BY MOUTH DAILY. Patient not taking: Reported on 11/28/2017 07/19/16   Lucretia Kern, DO  Vitamin D, Ergocalciferol, (DRISDOL) 50000 units CAPS capsule Take 1 capsule (50,000 Units total) by mouth every 7 (seven) days. Patient not taking: Reported on 11/28/2017 10/16/17   Eber Jones, MD    Family History Family History  Problem Relation Age of Onset  . Hyperlipidemia Mother   . Hypertension Mother   . AAA (abdominal aortic aneurysm) Mother   . Stroke Father   . Hypertension Father   . Hyperlipidemia Brother        x2  . Hypertension Brother        x2  . Liver cancer Maternal Aunt   . Breast cancer Cousin   . Colon cancer Cousin   . Colon polyps Neg Hx   . Esophageal cancer Neg Hx   . Stomach cancer Neg Hx   . Rectal cancer Neg Hx     Social History Social History   Tobacco Use  . Smoking status: Former Smoker    Types: Cigarettes    Last attempt to quit: 07/31/1996    Years since quitting: 21.3  . Smokeless tobacco: Never Used  Substance Use Topics  . Alcohol use: Yes    Comment: very little  . Drug use: No     Allergies   Patient has no known allergies.   Review of Systems Review of Systems  All other systems reviewed and are negative.    Physical Exam Updated Vital Signs BP 135/84   Pulse 64   Temp 98.7 F (37.1 C) (Oral)   Resp 18   Ht 5' 10.5" (1.791 m)   Wt 127 kg   LMP 10/04/2014   SpO2 97%   BMI 39.61 kg/m   Physical Exam  Constitutional: She is oriented to person, place, and time. She appears well-developed and well-nourished.  nad  HENT:  Head: Normocephalic and atraumatic.  Eyes: Conjunctivae are normal.  Neck: Neck supple.  Cardiovascular: Normal rate and regular rhythm.  Pulmonary/Chest: Effort normal and breath sounds normal.  Abdominal: Soft. Bowel sounds are normal.  Musculoskeletal: Normal range of  motion.  Neurological: She is alert and oriented to person, place, and time.  Skin: Skin is warm and dry.  Psychiatric: She has a normal mood and affect. Her behavior is normal.  Nursing note and vitals reviewed.    ED Treatments /  Results  Labs (all labs ordered are listed, but only abnormal results are displayed) Labs Reviewed  D-DIMER, QUANTITATIVE (NOT AT [facility]) - Abnormal; Notable for the following components:      Result Value   D-Dimer, Quant 0.54 (*)    All other components within normal limits  BASIC METABOLIC PANEL  CBC  I-STAT TROPONIN, ED  I-STAT BETA HCG BLOOD, ED ([facility], [facility], [facility] ONLY)    EKG EKG Interpretation  Date/Time:  Thursday November 28 2017 10:04:11 EST Ventricular Rate:  68 PR Interval:    QRS Duration: 88 QT Interval:  413 QTC Calculation: 440 R Axis:   29 Text Interpretation:  Sinus rhythm Abnormal R-wave progression, early transition Confirmed by Nat Christen [phone]) on 11/28/2017 12:17:26 PM   Radiology Dg Chest 2 View  Result Date: 11/28/2017 CLINICAL DATA:  Left-sided chest pain. EXAM: CHEST - 2 VIEW COMPARISON:  10/06/2017 FINDINGS: The heart size and mediastinal contours are within normal limits. There is no evidence of pulmonary edema, consolidation, pneumothorax, nodule or pleural fluid. The visualized skeletal structures are unremarkable. IMPRESSION: No active cardiopulmonary disease. Electronically Signed   By: Aletta Edouard M.D.   On: 11/28/2017 10:23   Vas [place] Lower Extremity Venous (dvt)  Result Date: 11/28/2017  Lower Venous Study Indications: Swelling.  Performing Technologist: Landry Mellow RDMS, RVT  Examination Guidelines: A complete evaluation includes B-mode imaging, spectral Doppler, color Doppler, and power Doppler as needed of all accessible portions of each vessel. Bilateral testing is considered an integral part of a complete examination. Limited examinations for reoccurring indications may be performed as noted.  Right Venous  Findings: +---------+---------------+---------+-----------+----------+-------------------+          CompressibilityPhasicitySpontaneityPropertiesSummary             +---------+---------------+---------+-----------+----------+-------------------+ CFV      Full           Yes      Yes                                      +---------+---------------+---------+-----------+----------+-------------------+ SFJ      Full                                                             +---------+---------------+---------+-----------+----------+-------------------+ FV Prox  Full                                                             +---------+---------------+---------+-----------+----------+-------------------+ FV Mid   Full                                                             +---------+---------------+---------+-----------+----------+-------------------+ FV DistalFull                                                             +---------+---------------+---------+-----------+----------+-------------------+  PFV      Full                                                             +---------+---------------+---------+-----------+----------+-------------------+ POP      Full           Yes      Yes                                      +---------+---------------+---------+-----------+----------+-------------------+ PTV      Full                                                             +---------+---------------+---------+-----------+----------+-------------------+ PERO     Full                                         not well visualized +---------+---------------+---------+-----------+----------+-------------------+    Summary: Right: There is no evidence of deep vein thrombosis in the lower extremity. A cystic structure is found in the popliteal fossa.  *See table(s) above for measurements and observations. Electronically signed by Servando Snare MD  on 11/28/2017 at 5:59:32 PM.    Final     Procedures Procedures (including critical care time)  Medications Ordered in ED Medications  morphine 4 MG/ML injection 4 mg (4 mg Intravenous Given 11/28/17 1235)     Initial Impression / Assessment and Plan / ED Course  I have reviewed the triage vital signs and the nursing notes.  Pertinent labs & imaging results that were available during my care of the patient were reviewed by me and considered in my medical decision making (see chart for details).     Patient presents with chest pain for 24 hours.  She is in no acute distress.  Ultrasound of right leg showed no DVT.  Chest x-ray, EKG, troponin all negative.  These findings were discussed with the patient and her husband.  She will seek primary care follow-up.  Final Clinical Impressions(s) / ED Diagnoses   Final diagnoses:  Chest pain, unspecified type    ED Discharge Orders    None       Nat Christen, MD 11/29/17 1122

## 2017-11-28 NOTE — ED Notes (Signed)
Patient transported to X-ray 

## 2017-11-28 NOTE — ED Triage Notes (Signed)
Pt c/o left chest pains that started yesterday. Pt reports has had bad heart burn like these symptoms before and has issues with left arm tingling and numbness but when called nurse line today was advised to go to the ED.

## 2017-11-28 NOTE — Discharge Instructions (Addendum)
Tests showed no life-threatening condition.  I just got your formal report.  No blood clot was seen in your leg.  Tylenol or ibuprofen for pain.  Follow-up with your primary care doctor.

## 2017-11-28 NOTE — Progress Notes (Signed)
RLE venous duplex prelim: negative for DVT. Small baker's cyst noted.  Landry Mellow, RDMS, RVT

## 2017-12-05 ENCOUNTER — Ambulatory Visit: Payer: 59 | Admitting: Family Medicine

## 2017-12-09 ENCOUNTER — Encounter: Payer: Self-pay | Admitting: Gastroenterology

## 2017-12-09 NOTE — Progress Notes (Signed)
HPI:  Using dictation device. Unfortunately this device frequently misinterprets words/phrases.  Here for follow up multiple issues. Recent visit with my colleague for LE edema and ER visit for chest pain - see below. Today reports doing fine and CP resolved. Fired from job - had lots of stress so is actually ok with this and looking for another job. Trying to do better with diet and exercise - wants to lose 40lbs. Continues to have LE edema. used to wear compression - not wearing. Taking diuretic.  HTN/LE Edema/mild diastolic dysfunction: -meds: losartan 50mg , amlodipine 2.5 mg, hctz 12.5 -hx orthostasis with eval in hospital 10/22019  HLD/Obesity/Hyperglycemia: -seeing wt management clinic: -meds: statin, metformin - in the past - stopped after syncopal episode  Hx intermittent CP - long standing: -eval in 2015 with abnormal myovue --> cardiac cath was clear -hx GERD and anxiety -had echo 10/2017 - mild LVH, grade 1 diastolic dysfunction -seen in ER for LE edema and CP 11/2017 - LE [place] neg, ddimer mild positive, xray, EKG, trop neg per ekg notes  ROS: See pertinent positives and negatives per HPI.  Past Medical History:  Diagnosis Date  . Allergy   . Arthritis    in back  . Chest pain    neg cath 2016 after false positive myoview  . Chest pain   . Depression   . GERD (gastroesophageal reflux disease)   . High blood pressure   . Hyperglycemia   . Hyperlipidemia   . Hypertension   . Joint pain   . Knee pain   . Low back pain   . Migraine   . Mitral valve prolapse   . Muscle cramps     Past Surgical History:  Procedure Laterality Date  . BACK SURGERY    . CESAREAN SECTION    . LEFT HEART CATHETERIZATION WITH CORONARY ANGIOGRAM N/A 07/08/2013   Procedure: LEFT HEART CATHETERIZATION WITH CORONARY ANGIOGRAM;  Surgeon: Josue Hector, MD;  Location: [facility] CATH LAB;  Service: Cardiovascular;  Laterality: N/A;  . TUBAL LIGATION      Family History  Problem Relation Age of  Onset  . Hyperlipidemia Mother   . Hypertension Mother   . AAA (abdominal aortic aneurysm) Mother   . Stroke Father   . Hypertension Father   . Hyperlipidemia Brother        x2  . Hypertension Brother        x2  . Liver cancer Maternal Aunt   . Breast cancer Cousin   . Colon cancer Cousin   . Colon polyps Neg Hx   . Esophageal cancer Neg Hx   . Stomach cancer Neg Hx   . Rectal cancer Neg Hx     SOCIAL HX: see hpi   Current Outpatient Medications:  .  acetaminophen (TYLENOL) 500 MG tablet, Take 2 tablets (1,000 mg total) by mouth at bedtime., Disp: 30 tablet, Rfl: 0 .  amLODipine (NORVASC) 2.5 MG tablet, Take 1 tablet (2.5 mg total) by mouth daily., Disp: 90 tablet, Rfl: 3 .  diclofenac sodium (VOLTAREN) 1 % GEL, Apply 2 g topically 4 (four) times daily as needed (pain)., Disp: , Rfl:  .  gabapentin (NEURONTIN) 300 MG capsule, Take 300-600 mg by mouth at bedtime. , Disp: , Rfl:  .  hydrochlorothiazide (MICROZIDE) 12.5 MG capsule, Take 1 capsule (12.5 mg total) by mouth daily., Disp: 30 capsule, Rfl: 3 .  magnesium 30 MG tablet, Take 30 mg by mouth 2 (two) times daily., Disp: , Rfl:  .  OVER THE COUNTER MEDICATION, Apply 1 application topically daily as needed (pain). Red Oil, Disp: , Rfl:  .  triamcinolone cream (KENALOG) 0.1 %, Apply 1 application topically daily as needed., Disp: 45 g, Rfl: 0 .  VITAMIN D PO, Take 1 tablet by mouth daily., Disp: , Rfl:   Current Facility-Administered Medications:  .  0.9 %  sodium chloride infusion, 500 mL, Intravenous, Once, Lucio Edward T, MD  EXAM:  Vitals:   12/10/17 1305  BP: 122/80  Pulse: 72  Temp: 98.4 F (36.9 C)    Body mass index is 41.05 kg/m.  GENERAL: vitals reviewed and listed above, alert, oriented, appears well hydrated and in no acute distress  HEENT: atraumatic, conjunttiva clear, no obvious abnormalities on inspection of external nose and ears  NECK: no obvious masses on inspection  LUNGS: clear to  auscultation bilaterally, no wheezes, rales or rhonchi, good air movement  CV: HRRR, 1 + ankle edema bilat  MS: moves all extremities without noticeable abnormality  PSYCH: pleasant and cooperative, no obvious depression or anxiety  ASSESSMENT AND PLAN:  Discussed the following assessment and plan:  Hypertension, unspecified type  Lower extremity edema  Hyperlipidemia, unspecified hyperlipidemia type  Morbid obesity ([place])  -advised strict adherence to healthy diet (details in pt instructions), regular exercise with 20lb weight reduction in next few months -compression, elevation and continue hctz -cp resolved, advised prompt re-eval if recurs - discussed potential etiologies -follow up 3 months. Patient advised to return or notify a doctor immediately if symptoms recur or new concerns arise.  Patient Instructions  BEFORE YOU LEAVE: -follow up: 3 months, come fasting and will recheck labs  Healthy Mediterranean or DASH diet - see below for details.  Regular aerobic exercise.  Compression sock daily, remove at night. Elevate legs.    We recommend the following healthy lifestyle for LIFE: 1) Small portions. But, make sure to get regular (at least 3 per day), healthy meals and small healthy snacks if needed.  2) Eat a healthy clean diet.   TRY TO EAT: -at least 5-7 servings of low sugar, colorful, and nutrient rich vegetables per day (not corn, potatoes or bananas.) -berries are the best choice if you wish to eat fruit (only eat small amounts if trying to reduce weight)  -lean meets (fish, white meat of chicken or [place]) -vegan proteins for some meals - beans or tofu, whole grains, nuts and seeds -Replace bad fats with good fats - good fats include: fish, nuts and seeds, canola oil, olive oil -small amounts of low fat or non fat dairy -small amounts of100 % whole grains - check the lables -drink plenty of water  AVOID: -SUGAR, sweets, anything with added sugar, corn  syrup or sweeteners - must read labels as even foods advertised as "healthy" often are loaded with sugar -if you must have a sweetener, small amounts of stevia may be best -sweetened beverages and artificially sweetened beverages -simple starches (rice, bread, potatoes, pasta, chips, etc - small amounts of 100% whole grains are ok) -red meat, pork, butter -fried foods, fast food, processed food, excessive dairy, eggs and coconut.  3)Get at least 150 minutes of sweaty aerobic exercise per week.  4)Reduce stress - consider counseling, meditation and relaxation to balance other aspects of your life.    Lucretia Kern, DO

## 2017-12-10 ENCOUNTER — Ambulatory Visit (INDEPENDENT_AMBULATORY_CARE_PROVIDER_SITE_OTHER): Payer: 59 | Admitting: Family Medicine

## 2017-12-10 ENCOUNTER — Encounter: Payer: Self-pay | Admitting: Family Medicine

## 2017-12-10 VITALS — BP 122/80 | HR 72 | Temp 98.4°F | Ht 70.5 in | Wt 290.2 lb

## 2017-12-10 DIAGNOSIS — E785 Hyperlipidemia, unspecified: Secondary | ICD-10-CM | POA: Diagnosis not present

## 2017-12-10 DIAGNOSIS — I10 Essential (primary) hypertension: Secondary | ICD-10-CM

## 2017-12-10 DIAGNOSIS — R600 Localized edema: Secondary | ICD-10-CM | POA: Diagnosis not present

## 2017-12-10 NOTE — Patient Instructions (Signed)
BEFORE YOU LEAVE: -follow up: 3 months, come fasting and will recheck labs  Healthy Mediterranean or DASH diet - see below for details.  Regular aerobic exercise.  Compression sock daily, remove at night. Elevate legs.    We recommend the following healthy lifestyle for LIFE: 1) Small portions. But, make sure to get regular (at least 3 per day), healthy meals and small healthy snacks if needed.  2) Eat a healthy clean diet.   TRY TO EAT: -at least 5-7 servings of low sugar, colorful, and nutrient rich vegetables per day (not corn, potatoes or bananas.) -berries are the best choice if you wish to eat fruit (only eat small amounts if trying to reduce weight)  -lean meets (fish, white meat of chicken or [place]) -vegan proteins for some meals - beans or tofu, whole grains, nuts and seeds -Replace bad fats with good fats - good fats include: fish, nuts and seeds, canola oil, olive oil -small amounts of low fat or non fat dairy -small amounts of100 % whole grains - check the lables -drink plenty of water  AVOID: -SUGAR, sweets, anything with added sugar, corn syrup or sweeteners - must read labels as even foods advertised as "healthy" often are loaded with sugar -if you must have a sweetener, small amounts of stevia may be best -sweetened beverages and artificially sweetened beverages -simple starches (rice, bread, potatoes, pasta, chips, etc - small amounts of 100% whole grains are ok) -red meat, pork, butter -fried foods, fast food, processed food, excessive dairy, eggs and coconut.  3)Get at least 150 minutes of sweaty aerobic exercise per week.  4)Reduce stress - consider counseling, meditation and relaxation to balance other aspects of your life.

## 2017-12-30 ENCOUNTER — Other Ambulatory Visit: Payer: Self-pay | Admitting: Family Medicine

## 2018-01-06 ENCOUNTER — Other Ambulatory Visit: Payer: Self-pay | Admitting: Family Medicine

## 2018-01-06 DIAGNOSIS — I872 Venous insufficiency (chronic) (peripheral): Secondary | ICD-10-CM

## 2018-01-07 ENCOUNTER — Encounter

## 2018-01-07 ENCOUNTER — Encounter: Payer: Self-pay | Admitting: Family Medicine

## 2018-01-07 ENCOUNTER — Ambulatory Visit (INDEPENDENT_AMBULATORY_CARE_PROVIDER_SITE_OTHER): Payer: 59 | Admitting: Family Medicine

## 2018-01-07 VITALS — BP 128/82 | HR 75 | Temp 98.6°F | Ht 70.5 in | Wt 297.7 lb

## 2018-01-07 DIAGNOSIS — I10 Essential (primary) hypertension: Secondary | ICD-10-CM

## 2018-01-07 DIAGNOSIS — R600 Localized edema: Secondary | ICD-10-CM

## 2018-01-07 DIAGNOSIS — L03115 Cellulitis of right lower limb: Secondary | ICD-10-CM | POA: Diagnosis not present

## 2018-01-07 MED ORDER — FUROSEMIDE 20 MG PO TABS: 20.0000 mg | ORAL_TABLET | Freq: Every day | ORAL | 3 refills | Status: DC

## 2018-01-07 MED ORDER — DOXYCYCLINE HYCLATE 100 MG PO TABS: 100.0000 mg | ORAL_TABLET | Freq: Two times a day (BID) | ORAL | 0 refills | Status: DC

## 2018-01-07 NOTE — Patient Instructions (Addendum)
BEFORE YOU LEAVE: -labs -follow up: Monday 1/13 - ok to use SDA if needed  START the Doxycycline today and take as instructed for infection. START this today after your appointment.  STOP the hydrochlorothiazide. START the lasix 20mg  dialy instead.  AVOID processed foods, sweets and salty foods. EAT a very healthy low sugar diet - see below for details.  ELEVATE legs for 30 minutes above the waist twice daily.  -We placed a referral for you as discussed to the vascular doctor. It usually takes about 1-2 weeks to process and schedule this referral. If you have not heard from [place] regarding this appointment in 2 weeks please contact our office.  I hope you are feeling better soon! Seek care promptly if your symptoms worsen, new concerns arise or you are not improving with treatment. Seek emergency care if redness and pain is spreading or worsening, fevers, malaise or feeling sick.

## 2018-01-07 NOTE — Progress Notes (Signed)
HPI:  Using dictation device. Unfortunately this device frequently misinterprets words/phrases.  Acute visit for LE edema: -she feels has worsened -sleeps in recliner because bed uncomfortable for her back -using compression socks -has reduced norvasc dose and is taking hctz 12.5mg  daily -no fevers, sob, malaise -does have some pain in the right leg in the skin -diet is poor - processed foods, bacon, high sodium  ROS: See pertinent positives and negatives per HPI.  Past Medical History:  Diagnosis Date  . Allergy   . Arthritis    in back  . Chest pain    neg cath 2016 after false positive myoview  . Chest pain   . Depression   . GERD (gastroesophageal reflux disease)   . High blood pressure   . Hyperglycemia   . Hyperlipidemia   . Hypertension   . Joint pain   . Knee pain   . Low back pain   . Migraine   . Mitral valve prolapse   . Muscle cramps     Past Surgical History:  Procedure Laterality Date  . BACK SURGERY    . CESAREAN SECTION    . LEFT HEART CATHETERIZATION WITH CORONARY ANGIOGRAM N/A 07/08/2013   Procedure: LEFT HEART CATHETERIZATION WITH CORONARY ANGIOGRAM;  Surgeon: Josue Hector, MD;  Location: [facility] CATH LAB;  Service: Cardiovascular;  Laterality: N/A;  . TUBAL LIGATION      Family History  Problem Relation Age of Onset  . Hyperlipidemia Mother   . Hypertension Mother   . AAA (abdominal aortic aneurysm) Mother   . Stroke Father   . Hypertension Father   . Hyperlipidemia Brother        x2  . Hypertension Brother        x2  . Liver cancer Maternal Aunt   . Breast cancer Cousin   . Colon cancer Cousin   . Colon polyps Neg Hx   . Esophageal cancer Neg Hx   . Stomach cancer Neg Hx   . Rectal cancer Neg Hx     SOCIAL HX: see hpi   Current Outpatient Medications:  .  acetaminophen (TYLENOL) 500 MG tablet, Take 2 tablets (1,000 mg total) by mouth at bedtime., Disp: 30 tablet, Rfl: 0 .  amLODipine (NORVASC) 2.5 MG tablet, Take 1 tablet (2.5  mg total) by mouth daily., Disp: 90 tablet, Rfl: 3 .  diclofenac sodium (VOLTAREN) 1 % GEL, Apply 2 g topically 4 (four) times daily as needed (pain)., Disp: , Rfl:  .  gabapentin (NEURONTIN) 300 MG capsule, Take 300-600 mg by mouth at bedtime. , Disp: , Rfl:  .  losartan (COZAAR) 100 MG tablet, TAKE 1 TABLET BY MOUTH EVERY DAY, Disp: 90 tablet, Rfl: 1 .  magnesium 30 MG tablet, Take 30 mg by mouth 2 (two) times daily., Disp: , Rfl:  .  OVER THE COUNTER MEDICATION, Apply 1 application topically daily as needed (pain). Red Oil, Disp: , Rfl:  .  triamcinolone cream (KENALOG) 0.1 %, APPLY 1 APPLICATION TOPICALLY DAILY AS NEEDED., Disp: 45 g, Rfl: 0 .  VITAMIN D PO, Take 1 tablet by mouth daily., Disp: , Rfl:  .  doxycycline (VIBRA-TABS) 100 MG tablet, Take 1 tablet (100 mg total) by mouth 2 (two) times daily., Disp: 14 tablet, Rfl: 0 .  furosemide (LASIX) 20 MG tablet, Take 1 tablet (20 mg total) by mouth daily., Disp: 30 tablet, Rfl: 3  Current Facility-Administered Medications:  .  0.9 %  sodium chloride infusion, 500 mL, Intravenous,  Once, Ladene Artist, MD  EXAM:  Vitals:   01/07/18 1636  BP: 128/82  Pulse: 75  Temp: 98.6 F (37 C)  SpO2: 98%    Body mass index is 42.11 kg/m.  GENERAL: vitals reviewed and listed above, alert, oriented, appears well hydrated and in no acute distress  HEENT: atraumatic, conjunttiva clear, no obvious abnormalities on inspection of external nose and ears  NECK: no obvious masses on inspection  LUNGS: clear to auscultation bilaterally, no wheezes, rales or rhonchi, good air movement  CV: HRRR, bilateral LE edema to the knees  SKIN: patchy area of erythema, induration and warmth in sharply demarcated area involving most of the ant lower leg between the ankle and the knee  MS: moves all extremities without noticeable abnormality  PSYCH: pleasant and cooperative, no obvious depression or anxiety  ASSESSMENT AND PLAN:  Discussed the following  assessment and plan:  Leg edema - Plan: Ambulatory referral to Vascular Surgery  Cellulitis of right leg - Plan: Basic metabolic panel  Hypertension, unspecified type - Plan: Basic metabolic panel  Morbid obesity ([place]) - Plan: Hemoglobin A1c  -we discussed possible serious and likely etiologies, workup and treatment, treatment risks and return precautions -after this discussion, Tristy opted for: -start abx for likely new cellulitis today -elevation 30 minutes twice daily -switch to lasix 20mg  daily -labs per orders -refer vasc for this chronic issue -advised needs to sleep with legs flat or elevated - not down -elevate legs bid 30 minutes -stressed importance of healthy low sugar, low sodium diet and weight reduction -follow up in 5 days for recheck -advised to follow up sooner of seek emergency care if any worsening or new symptoms   Patient Instructions  BEFORE YOU LEAVE: -labs -follow up: Monday 1/13 - ok to use SDA if needed  START the Doxycycline today and take as instructed for infection. START this today after your appointment.  STOP the hydrochlorothiazide. START the lasix 20mg  dialy instead.  AVOID processed foods, sweets and salty foods. EAT a very healthy low sugar diet - see below for details.  ELEVATE legs for 30 minutes above the waist twice daily.  -We placed a referral for you as discussed to the vascular doctor. It usually takes about 1-2 weeks to process and schedule this referral. If you have not heard from [place] regarding this appointment in 2 weeks please contact our office.  I hope you are feeling better soon! Seek care promptly if your symptoms worsen, new concerns arise or you are not improving with treatment. Seek emergency care if redness and pain is spreading or worsening, fevers, malaise or feeling sick.     Lucretia Kern, DO

## 2018-01-08 LAB — BASIC METABOLIC PANEL
BUN: 11 mg/dL (ref 6–23)
CO2: 28 mEq/L (ref 19–32)
Calcium: 9.7 mg/dL (ref 8.4–10.5)
Chloride: 104 mEq/L (ref 96–112)
Creatinine, Ser: 0.86 mg/dL (ref 0.40–1.20)
GFR: 87.78 mL/min (ref >60.00)
Glucose, Bld: 84 mg/dL (ref 70–99)
Potassium: 4 mEq/L (ref 3.5–5.1)
SODIUM: 141 meq/L (ref 135–145)

## 2018-01-08 LAB — HEMOGLOBIN A1C: Hgb A1c MFr Bld: 5.9 % (ref 4.6–6.5)

## 2018-01-10 NOTE — Progress Notes (Deleted)
HPI:  Using dictation device. Unfortunately this device frequently misinterprets words/phrases.  *** Follow up cellulitis: -R leg - treated with elevation, abx, referral vascular 1/7/22020 -hx chronic LE edema, had been sleeping in recliner with legs down lately and worsened edema -hx grade 1 diastolic dysfunction echo 10/2017 -had venous duplex US R leg in ER 11/2017 per ER notes  ROS: See pertinent positives and negatives per HPI.  Past Medical History:  Diagnosis Date  . Allergy   . Arthritis    in back  . Chest pain    neg cath 2016 after false positive myoview  . Chest pain   . Depression   . GERD (gastroesophageal reflux disease)   . High blood pressure   . Hyperglycemia   . Hyperlipidemia   . Hypertension   . Joint pain   . Knee pain   . Low back pain   . Migraine   . Mitral valve prolapse   . Muscle cramps     Past Surgical History:  Procedure Laterality Date  . BACK SURGERY    . CESAREAN SECTION    . LEFT HEART CATHETERIZATION WITH CORONARY ANGIOGRAM N/A 07/08/2013   Procedure: LEFT HEART CATHETERIZATION WITH CORONARY ANGIOGRAM;  Surgeon: Josue Hector, MD;  Location: [facility] CATH LAB;  Service: Cardiovascular;  Laterality: N/A;  . TUBAL LIGATION      Family History  Problem Relation Age of Onset  . Hyperlipidemia Mother   . Hypertension Mother   . AAA (abdominal aortic aneurysm) Mother   . Stroke Father   . Hypertension Father   . Hyperlipidemia Brother        x2  . Hypertension Brother        x2  . Liver cancer Maternal Aunt   . Breast cancer Cousin   . Colon cancer Cousin   . Colon polyps Neg Hx   . Esophageal cancer Neg Hx   . Stomach cancer Neg Hx   . Rectal cancer Neg Hx     SOCIAL HX: ***   Current Outpatient Medications:  .  acetaminophen (TYLENOL) 500 MG tablet, Take 2 tablets (1,000 mg total) by mouth at bedtime., Disp: 30 tablet, Rfl: 0 .  amLODipine (NORVASC) 2.5 MG tablet, Take 1 tablet (2.5 mg total) by mouth daily., Disp: 90  tablet, Rfl: 3 .  diclofenac sodium (VOLTAREN) 1 % GEL, Apply 2 g topically 4 (four) times daily as needed (pain)., Disp: , Rfl:  .  doxycycline (VIBRA-TABS) 100 MG tablet, Take 1 tablet (100 mg total) by mouth 2 (two) times daily., Disp: 14 tablet, Rfl: 0 .  furosemide (LASIX) 20 MG tablet, Take 1 tablet (20 mg total) by mouth daily., Disp: 30 tablet, Rfl: 3 .  gabapentin (NEURONTIN) 300 MG capsule, Take 300-600 mg by mouth at bedtime. , Disp: , Rfl:  .  losartan (COZAAR) 100 MG tablet, TAKE 1 TABLET BY MOUTH EVERY DAY, Disp: 90 tablet, Rfl: 1 .  magnesium 30 MG tablet, Take 30 mg by mouth 2 (two) times daily., Disp: , Rfl:  .  OVER THE COUNTER MEDICATION, Apply 1 application topically daily as needed (pain). Red Oil, Disp: , Rfl:  .  triamcinolone cream (KENALOG) 0.1 %, APPLY 1 APPLICATION TOPICALLY DAILY AS NEEDED., Disp: 45 g, Rfl: 0 .  VITAMIN D PO, Take 1 tablet by mouth daily., Disp: , Rfl:   Current Facility-Administered Medications:  .  0.9 %  sodium chloride infusion, 500 mL, Intravenous, Once, Fuller Plan Pricilla Riffle, MD  EXAM:  There were no vitals filed for this visit.  There is no height or weight on file to calculate BMI.  GENERAL: vitals reviewed and listed above, alert, oriented, appears well hydrated and in no acute distress  HEENT: atraumatic, conjunttiva clear, no obvious abnormalities on inspection of external nose and ears  NECK: no obvious masses on inspection  LUNGS: clear to auscultation bilaterally, no wheezes, rales or rhonchi, good air movement  CV: HRRR, no peripheral edema  MS: moves all extremities without noticeable abnormality *** PSYCH: pleasant and cooperative, no obvious depression or anxiety  ASSESSMENT AND PLAN:  Discussed the following assessment and plan:  No diagnosis found.  *** -Patient advised to return or notify a doctor immediately if symptoms worsen or persist or new concerns arise.  There are no Patient Instructions on file for this  visit.  Lucretia Kern, DO

## 2018-01-13 ENCOUNTER — Ambulatory Visit: Payer: 59 | Admitting: Family Medicine

## 2018-01-15 NOTE — Progress Notes (Signed)
HPI:  Using dictation device. Unfortunately this device frequently misinterprets words/phrases.  Dorien Chihuahua - Follow up cellulitis:  -R leg - treated with elevation, abx, lasix, referral vascular 1/7/22020 - reports had upset stomach from the abx -redness and pain resolved, still with some swelling - using light compression -hx chronic LE edema, had been sleeping in recliner with legs down lately and worsened edema  -hx grade 1 diastolic dysfunction echo 10/2017  -had venous duplex US R leg in ER 11/2017 per ER notes  -no fevers, sob, cp, malaise  ROS: See pertinent positives and negatives per HPI.  Past Medical History:  Diagnosis Date  . Allergy   . Arthritis    in back  . Chest pain    neg cath 2016 after false positive myoview  . Chest pain   . Depression   . GERD (gastroesophageal reflux disease)   . High blood pressure   . Hyperglycemia   . Hyperlipidemia   . Hypertension   . Joint pain   . Knee pain   . Low back pain   . Migraine   . Mitral valve prolapse   . Muscle cramps     Past Surgical History:  Procedure Laterality Date  . BACK SURGERY    . CESAREAN SECTION    . LEFT HEART CATHETERIZATION WITH CORONARY ANGIOGRAM N/A 07/08/2013   Procedure: LEFT HEART CATHETERIZATION WITH CORONARY ANGIOGRAM;  Surgeon: Josue Hector, MD;  Location: [facility] CATH LAB;  Service: Cardiovascular;  Laterality: N/A;  . TUBAL LIGATION      Family History  Problem Relation Age of Onset  . Hyperlipidemia Mother   . Hypertension Mother   . AAA (abdominal aortic aneurysm) Mother   . Stroke Father   . Hypertension Father   . Hyperlipidemia Brother        x2  . Hypertension Brother        x2  . Liver cancer Maternal Aunt   . Breast cancer Cousin   . Colon cancer Cousin   . Colon polyps Neg Hx   . Esophageal cancer Neg Hx   . Stomach cancer Neg Hx   . Rectal cancer Neg Hx     SOCIAL HX: see hpi   Current Outpatient Medications:  .  acetaminophen (TYLENOL) 500 MG tablet,  Take 2 tablets (1,000 mg total) by mouth at bedtime., Disp: 30 tablet, Rfl: 0 .  diclofenac sodium (VOLTAREN) 1 % GEL, Apply 2 g topically 4 (four) times daily as needed (pain)., Disp: , Rfl:  .  furosemide (LASIX) 20 MG tablet, Take 1.5 tablets (30 mg total) by mouth daily., Disp: 135 tablet, Rfl: 1 .  gabapentin (NEURONTIN) 300 MG capsule, Take 300-600 mg by mouth at bedtime. , Disp: , Rfl:  .  losartan (COZAAR) 100 MG tablet, TAKE 1 TABLET BY MOUTH EVERY DAY, Disp: 90 tablet, Rfl: 1 .  magnesium 30 MG tablet, Take 30 mg by mouth 2 (two) times daily., Disp: , Rfl:  .  OVER THE COUNTER MEDICATION, Apply 1 application topically daily as needed (pain). Red Oil, Disp: , Rfl:  .  triamcinolone cream (KENALOG) 0.1 %, APPLY 1 APPLICATION TOPICALLY DAILY AS NEEDED., Disp: 45 g, Rfl: 0 .  VITAMIN D PO, Take 1 tablet by mouth daily., Disp: , Rfl:   Current Facility-Administered Medications:  .  0.9 %  sodium chloride infusion, 500 mL, Intravenous, Once, Ladene Artist, MD  EXAM:  Vitals:   01/16/18 1337  BP: 128/80  Pulse: 70  Temp: 98.4 F (36.9 C)  SpO2: 98%    Body mass index is 41.29 kg/m.  GENERAL: vitals reviewed and listed above, alert, oriented, appears well hydrated and in no acute distress  HEENT: atraumatic, conjunttiva clear, no obvious abnormalities on inspection of external nose and ears  NECK: no obvious masses on inspection  LUNGS: clear to auscultation bilaterally, no wheezes, rales or rhonchi, good air movement  CV: HRRR, 1-2+ LE edema  MS: moves all extremities without noticeable abnormality  PSYCH: pleasant and cooperative, no obvious depression or anxiety  ASSESSMENT AND PLAN:  Discussed the following assessment and plan:  Bilateral lower extremity edema  Hypertension, unspecified type  Morbid obesity ([place])  -we discussed possible serious and likely etiologies, workup and treatment, treatment risks and return precautions - cellulitis resolved.  Continued LE edema.  -after this discussion, Tezra opted for increased lasix, hold norvasc, compression, elevation, have assitant check on vasc referral, advised healthy low sugar diet with avoidance sweets, process, fast food, high salt foods, regular exercise. -follow up advised 1 month, labs then -consider cardiology re-eval, has ha din the past -of course, we advised Shanell  to return or notify a doctor immediately if symptoms worsen or persist or new concerns arise.   Patient Instructions  BEFORE YOU LEAVE: -tetanus booster -check on status vascular referral and update pt -follow up: 1 month  Lasix 30mg  (1.5 tablets) mg daily.  Elevate 30 minutes 2x daily  Compression.  Hold the norvasc and monitor blood pressure. Consider purchasing cuff.  I hope you are feeling better soon! Seek care promptly if your symptoms worsen, new concerns arise or you are not improving with treatment.      Lucretia Kern, DO

## 2018-01-16 ENCOUNTER — Ambulatory Visit (INDEPENDENT_AMBULATORY_CARE_PROVIDER_SITE_OTHER): Payer: 59 | Admitting: Family Medicine

## 2018-01-16 ENCOUNTER — Encounter: Payer: Self-pay | Admitting: Family Medicine

## 2018-01-16 VITALS — BP 128/80 | HR 70 | Temp 98.4°F | Ht 70.5 in | Wt 291.9 lb

## 2018-01-16 DIAGNOSIS — R600 Localized edema: Secondary | ICD-10-CM

## 2018-01-16 DIAGNOSIS — I10 Essential (primary) hypertension: Secondary | ICD-10-CM | POA: Diagnosis not present

## 2018-01-16 DIAGNOSIS — Z23 Encounter for immunization: Secondary | ICD-10-CM

## 2018-01-16 MED ORDER — FUROSEMIDE 20 MG PO TABS: 30.0000 mg | ORAL_TABLET | Freq: Every day | ORAL | 1 refills | Status: DC

## 2018-01-16 NOTE — Addendum Note (Signed)
Addended by: Agnes Lawrence on: 01/16/2018 02:16 PM   Modules accepted: Orders

## 2018-01-16 NOTE — Patient Instructions (Addendum)
BEFORE YOU LEAVE: -tetanus booster -check on status vascular referral and update pt -follow up: 1 month  Lasix 30mg  (1.5 tablets) mg daily.  Elevate 30 minutes 2x daily  Compression.  Hold the norvasc and monitor blood pressure. Consider purchasing cuff.  I hope you are feeling better soon! Seek care promptly if your symptoms worsen, new concerns arise or you are not improving with treatment.

## 2018-02-06 ENCOUNTER — Encounter: Payer: Self-pay | Admitting: Internal Medicine

## 2018-02-06 ENCOUNTER — Ambulatory Visit: Payer: Self-pay

## 2018-02-06 ENCOUNTER — Ambulatory Visit (INDEPENDENT_AMBULATORY_CARE_PROVIDER_SITE_OTHER): Payer: 59 | Admitting: Internal Medicine

## 2018-02-06 VITALS — BP 160/100 | HR 72 | Temp 98.9°F | Wt 288.5 lb

## 2018-02-06 DIAGNOSIS — I10 Essential (primary) hypertension: Secondary | ICD-10-CM | POA: Diagnosis not present

## 2018-02-06 DIAGNOSIS — G4452 New daily persistent headache (NDPH): Secondary | ICD-10-CM

## 2018-02-06 MED ORDER — LOSARTAN POTASSIUM 100 MG PO TABS: 100.0000 mg | ORAL_TABLET | Freq: Every day | ORAL | 1 refills | Status: DC

## 2018-02-06 NOTE — Patient Instructions (Signed)
-  Nice meeting you today!  -Please resume your Losartan 100 mg daily.  -Schedule follow up with Dr. Maudie Mercury in 6 weeks.  -Come see [place] sooner if headache does not improve.

## 2018-02-06 NOTE — Telephone Encounter (Signed)
Pt c/o high blood pressure, headache, lightheadedness and dizziness. The neurological symptoms started last week. Pt's BP Tuesday 196/122; Wednesday 191/92. Pt took the BP at her local [employer]. Pt was wanting to get her BP rechecked. Pt advised to go to the ED for evaluation. Pt refused. Tyrone Nine at PCP office and notified her on triage and pt's refusal to go to the ED. Per Vita Barley offer pt to go to [facility] and if she will not go to giver her an OV. Pt refused [facility]. Pt given appt with Dr Deniece Ree Hernandex for 1:30 pm/ Pt advised to go to ED or call 911 for worsening symptoms.  Reason for Disposition . [2] Systolic BP  >= 233 OR Diastolic >= 612 AND [2] cardiac or neurologic symptoms (e.g., chest pain, difficulty breathing, unsteady gait, blurred vision)  Answer Assessment - Initial Assessment Questions 1. BLOOD PRESSURE: "What is the blood pressure?" "Did you take at least two measurements 5 minutes apart?"     Tuesday 196/122 Wednesday 191/92 has not checked today 2. ONSET: "When did you take your blood pressure?"     Tuesday, Wednesday 3. HOW: "How did you obtain the blood pressure?" (e.g., visiting nurse, automatic home BP monitor)     BP machine in [employer]  4. HISTORY: "Do you have a history of high blood pressure?"     yes 5. MEDICATIONS: "Are you taking any medications for blood pressure?" "Have you missed any doses recently?"     no 6. OTHER SYMPTOMS: "Do you have any symptoms?" (e.g., headache, chest pain, blurred vision, difficulty breathing, weakness)     Headache, lightheadedness, dizzy that started last week 7. PREGNANCY: "Is there any chance you are pregnant?" "When was your last menstrual period?"     n/a  Protocols used: HIGH BLOOD PRESSURE-A-AH

## 2018-02-06 NOTE — Progress Notes (Signed)
Established Patient Office Visit     CC/Reason for Visit: HA, elevated BP  HPI: Jamie [name] is a 58 y.o. female who is coming in today for the above mentioned reasons. She tells me that she has been having issues with LE edema and Dr. Maudie Mercury asked her to stop taking amlodipine about 4 weeks ago. She used to be on losartan a while back but was then switched to the Norvasc. For the past 1.5 weeks she has been having a daily, persistent frontal HA. She finally went to [facility] to check her BP and on two separate occasions BP was >190/90 so she made this appointment today. The swelling, that had improved, is starting to return.   Past Medical/Surgical History: Past Medical History:  Diagnosis Date  . Allergy   . Arthritis    in back  . Chest pain    neg cath 2016 after false positive myoview  . Chest pain   . Depression   . GERD (gastroesophageal reflux disease)   . High blood pressure   . Hyperglycemia   . Hyperlipidemia   . Hypertension   . Joint pain   . Knee pain   . Low back pain   . Migraine   . Mitral valve prolapse   . Muscle cramps     Past Surgical History:  Procedure Laterality Date  . BACK SURGERY    . CESAREAN SECTION    . LEFT HEART CATHETERIZATION WITH CORONARY ANGIOGRAM N/A 07/08/2013   Procedure: LEFT HEART CATHETERIZATION WITH CORONARY ANGIOGRAM;  Surgeon: Josue Hector, MD;  Location: [facility] CATH LAB;  Service: Cardiovascular;  Laterality: N/A;  . TUBAL LIGATION      Social History:  reports that she quit smoking about 21 years ago. Her smoking use included cigarettes. She has never used smokeless tobacco. She reports current alcohol use. She reports that she does not use drugs.  Allergies: No Known Allergies  Family History:  Family History  Problem Relation Age of Onset  . Hyperlipidemia Mother   . Hypertension Mother   . AAA (abdominal aortic aneurysm) Mother   . Stroke Father   . Hypertension Father   . Hyperlipidemia Brother        x2  .  Hypertension Brother        x2  . Liver cancer Maternal Aunt   . Breast cancer Cousin   . Colon cancer Cousin   . Colon polyps Neg Hx   . Esophageal cancer Neg Hx   . Stomach cancer Neg Hx   . Rectal cancer Neg Hx      Current Outpatient Medications:  .  acetaminophen (TYLENOL) 500 MG tablet, Take 2 tablets (1,000 mg total) by mouth at bedtime., Disp: 30 tablet, Rfl: 0 .  diclofenac sodium (VOLTAREN) 1 % GEL, Apply 2 g topically 4 (four) times daily as needed (pain)., Disp: , Rfl:  .  furosemide (LASIX) 20 MG tablet, Take 1.5 tablets (30 mg total) by mouth daily., Disp: 135 tablet, Rfl: 1 .  gabapentin (NEURONTIN) 300 MG capsule, Take 300-600 mg by mouth at bedtime. , Disp: , Rfl:  .  losartan (COZAAR) 100 MG tablet, Take 1 tablet (100 mg total) by mouth daily., Disp: 90 tablet, Rfl: 1 .  magnesium 30 MG tablet, Take 30 mg by mouth 2 (two) times daily., Disp: , Rfl:  .  OVER THE COUNTER MEDICATION, Apply 1 application topically daily as needed (pain). Red Oil, Disp: , Rfl:  .  triamcinolone cream (KENALOG) 0.1 %, APPLY 1 APPLICATION TOPICALLY DAILY AS NEEDED., Disp: 45 g, Rfl: 0 .  VITAMIN D PO, Take 1 tablet by mouth daily., Disp: , Rfl:   Current Facility-Administered Medications:  .  0.9 %  sodium chloride infusion, 500 mL, Intravenous, Once, Ladene Artist, MD  Review of Systems:  Constitutional: Denies fever, chills, diaphoresis, appetite change and fatigue.  HEENT: Denies photophobia, eye pain, redness, hearing loss, ear pain, congestion, sore throat, rhinorrhea, sneezing, mouth sores, trouble swallowing, neck pain, neck stiffness and tinnitus.   Respiratory: Denies SOB, DOE, cough, chest tightness,  and wheezing.   Cardiovascular: Denies chest pain, palpitations and leg swelling.  Gastrointestinal: Denies nausea, vomiting, abdominal pain, diarrhea, constipation, blood in stool and abdominal distention.  Neurological: Denies dizziness, seizures, syncope, weakness,  light-headedness, numbness.   Physical Exam: Vitals:   02/06/18 1313  BP: (!) 160/100  Pulse: 72  Temp: 98.9 F (37.2 C)  TempSrc: Oral  SpO2: 98%  Weight: 288 lb 8 oz (130.9 kg)    Body mass index is 40.81 kg/m.   Constitutional: NAD, calm, comfortable Eyes: PERRL, lids and conjunctivae normal, wears corrective lenses ENMT: Mucous membranes are moist.  Respiratory: clear to auscultation bilaterally, no wheezing, no crackles. Normal respiratory effort. No accessory muscle use.  Cardiovascular: Regular rate and rhythm, no murmurs / rubs / gallops. 1+ pitting lower extremity edema bilaterally. 2+ pedal pulses. No carotid bruits.  Musculoskeletal: no clubbing / cyanosis. No joint deformity upper and lower extremities. Good ROM, no contractures. Normal muscle tone.  Psychiatric: Normal judgment and insight. Alert and oriented x 3. Normal mood.    Impression and Plan:  Essential hypertension  New daily persistent headache  -Suspect HA related to severe HTN since stopping Norvasc. -Because of LE edema, agree with DC of norvasc. -She had responded well to Losartan in the past, so will restart at 100 mg daily. -She had a BMET with normal renal function and K earlier this month. -Have asked her to return in 6 weeks for BP check. It might be prudent to recheck BMET at that time to follow Cr and K on newly restarted ARB.    Patient Instructions  -Nice meeting you today!  -Please resume your Losartan 100 mg daily.  -Schedule follow up with Dr. Maudie Mercury in 6 weeks.  -Come see [place] sooner if headache does not improve.     Lelon Frohlich, MD [facility]

## 2018-02-06 NOTE — Telephone Encounter (Signed)
Pt refused all dispositions for urgent care/ED.

## 2018-02-19 DIAGNOSIS — M5136 Other intervertebral disc degeneration, lumbar region: Secondary | ICD-10-CM | POA: Diagnosis not present

## 2018-02-20 ENCOUNTER — Ambulatory Visit: Payer: 59 | Admitting: Family Medicine

## 2018-03-04 ENCOUNTER — Other Ambulatory Visit: Payer: Self-pay

## 2018-03-04 DIAGNOSIS — R609 Edema, unspecified: Secondary | ICD-10-CM

## 2018-03-06 ENCOUNTER — Encounter: Payer: Self-pay | Admitting: Family

## 2018-03-06 ENCOUNTER — Encounter (HOSPITAL_COMMUNITY): Payer: 59

## 2018-03-06 ENCOUNTER — Encounter: Payer: 59 | Admitting: Vascular Surgery

## 2018-03-11 ENCOUNTER — Ambulatory Visit: Payer: 59 | Admitting: Family Medicine

## 2018-03-20 ENCOUNTER — Ambulatory Visit: Payer: 59 | Admitting: Family Medicine

## 2018-03-27 DIAGNOSIS — B356 Tinea cruris: Secondary | ICD-10-CM | POA: Diagnosis not present

## 2018-03-27 DIAGNOSIS — Z6841 Body Mass Index (BMI) 40.0 and over, adult: Secondary | ICD-10-CM | POA: Diagnosis not present

## 2018-03-27 DIAGNOSIS — Z01419 Encounter for gynecological examination (general) (routine) without abnormal findings: Secondary | ICD-10-CM | POA: Diagnosis not present

## 2018-04-01 ENCOUNTER — Other Ambulatory Visit: Payer: Self-pay | Admitting: Family Medicine

## 2018-04-14 ENCOUNTER — Other Ambulatory Visit: Payer: Self-pay | Admitting: Family Medicine

## 2018-04-15 MED ORDER — FUROSEMIDE 20 MG PO TABS: 20.0000 mg | ORAL_TABLET | Freq: Every day | ORAL | 1 refills | Status: DC

## 2018-04-15 NOTE — Addendum Note (Signed)
Addended by: Agnes Lawrence on: 04/15/2018 09:43 AM   Modules accepted: Orders

## 2018-04-16 ENCOUNTER — Encounter: Payer: Self-pay | Admitting: Internal Medicine

## 2018-04-16 ENCOUNTER — Other Ambulatory Visit: Payer: Self-pay

## 2018-04-16 ENCOUNTER — Ambulatory Visit (INDEPENDENT_AMBULATORY_CARE_PROVIDER_SITE_OTHER): Payer: 59 | Admitting: Internal Medicine

## 2018-04-16 DIAGNOSIS — G8929 Other chronic pain: Secondary | ICD-10-CM

## 2018-04-16 DIAGNOSIS — G43809 Other migraine, not intractable, without status migrainosus: Secondary | ICD-10-CM | POA: Diagnosis not present

## 2018-04-16 DIAGNOSIS — K219 Gastro-esophageal reflux disease without esophagitis: Secondary | ICD-10-CM | POA: Diagnosis not present

## 2018-04-16 DIAGNOSIS — M549 Dorsalgia, unspecified: Secondary | ICD-10-CM

## 2018-04-16 DIAGNOSIS — I10 Essential (primary) hypertension: Secondary | ICD-10-CM | POA: Diagnosis not present

## 2018-04-16 DIAGNOSIS — A6000 Herpesviral infection of urogenital system, unspecified: Secondary | ICD-10-CM | POA: Insufficient documentation

## 2018-04-16 DIAGNOSIS — A6004 Herpesviral vulvovaginitis: Secondary | ICD-10-CM

## 2018-04-16 DIAGNOSIS — M545 Low back pain: Secondary | ICD-10-CM

## 2018-04-16 MED ORDER — VALACYCLOVIR HCL 500 MG PO TABS: 500.0000 mg | ORAL_TABLET | Freq: Every day | ORAL | Status: DC

## 2018-04-16 MED ORDER — IBUPROFEN-FAMOTIDINE 800-26.6 MG PO TABS: 1.0000 | ORAL_TABLET | Freq: Every day | ORAL | Status: DC

## 2018-04-16 MED ORDER — FUROSEMIDE 20 MG PO TABS: 20.0000 mg | ORAL_TABLET | Freq: Every day | ORAL | 1 refills | Status: DC

## 2018-04-16 NOTE — Progress Notes (Signed)
Virtual Visit via Video Note  I connected with Jamie [name] on 04/16/18 at  8:00 AM EDT by a video enabled telemedicine application and verified that I am speaking with the correct person using two identifiers.  Location patient: home Location provider: work office Persons participating in the virtual visit: patient, provider  I discussed the limitations of evaluation and management by telemedicine and the availability of in person appointments. The patient expressed understanding and agreed to proceed.   HPI: This is a visit for transfer of care and for follow up of chronic conditions. PMH is significant for:  1. HTN. In Feb she was markedly hypertensive after being taken off norvasc due to LE edema. Started on losartan 100 mg. Most recent BP was 129/67 when she saw her GYN.  2. GERD, well controlled, not on meds.  3. Migraine HAs that have been well controlled with PRN OTC pain meds.  4. Genital Herpes on daily suppressive therapy with Valtrex.  5. Chronic back pain on gabapentin, duexis and voltaren gel.  She is doing well other than mild headaches and wonders if it may still be due to elevated BP.   ROS: Constitutional: Denies fever, chills, diaphoresis, appetite change and fatigue.  HEENT: Denies photophobia, eye pain, redness, hearing loss, ear pain, congestion, sore throat, rhinorrhea, sneezing, mouth sores, trouble swallowing, neck pain, neck stiffness and tinnitus.   Respiratory: Denies SOB, DOE, cough, chest tightness,  and wheezing.   Cardiovascular: Denies chest pain, palpitations and leg swelling.  Gastrointestinal: Denies nausea, vomiting, abdominal pain, diarrhea, constipation, blood in stool and abdominal distention.  Genitourinary: Denies dysuria, urgency, frequency, hematuria, flank pain and difficulty urinating.  Endocrine: Denies: hot or cold intolerance, sweats, changes in hair or nails, polyuria, polydipsia. Musculoskeletal: Denies myalgias, back pain,  joint swelling, arthralgias and gait problem.  Skin: Denies pallor, rash and wound.  Neurological: Denies dizziness, seizures, syncope, weakness, light-headedness, numbness. Hematological: Denies adenopathy. Easy bruising, personal or family bleeding history  Psychiatric/Behavioral: Denies suicidal ideation, mood changes, confusion, nervousness, sleep disturbance and agitation   Past Medical History:  Diagnosis Date  . Allergy   . Arthritis    in back  . Chest pain    neg cath 2016 after false positive myoview  . Chest pain   . Depression   . GERD (gastroesophageal reflux disease)   . High blood pressure   . Hyperglycemia   . Hyperlipidemia   . Hypertension   . Joint pain   . Knee pain   . Low back pain   . Migraine   . Mitral valve prolapse   . Muscle cramps     Past Surgical History:  Procedure Laterality Date  . BACK SURGERY    . CESAREAN SECTION    . LEFT HEART CATHETERIZATION WITH CORONARY ANGIOGRAM N/A 07/08/2013   Procedure: LEFT HEART CATHETERIZATION WITH CORONARY ANGIOGRAM;  Surgeon: Josue Hector, MD;  Location: [facility] CATH LAB;  Service: Cardiovascular;  Laterality: N/A;  . TUBAL LIGATION      Family History  Problem Relation Age of Onset  . Hyperlipidemia Mother   . Hypertension Mother   . AAA (abdominal aortic aneurysm) Mother   . Stroke Father   . Hypertension Father   . Hyperlipidemia Brother        x2  . Hypertension Brother        x2  . Liver cancer Maternal Aunt   . Breast cancer Cousin   . Colon cancer Cousin   .  Colon polyps Neg Hx   . Esophageal cancer Neg Hx   . Stomach cancer Neg Hx   . Rectal cancer Neg Hx     SOCIAL HX:   reports that she quit smoking about 21 years ago. Her smoking use included cigarettes. She has never used smokeless tobacco. She reports current alcohol use. She reports that she does not use drugs.   Current Outpatient Medications:  .  acetaminophen (TYLENOL) 500 MG tablet, Take 2 tablets (1,000 mg total) by mouth  at bedtime., Disp: 30 tablet, Rfl: 0 .  diclofenac sodium (VOLTAREN) 1 % GEL, Apply 2 g topically 4 (four) times daily as needed (pain)., Disp: , Rfl:  .  furosemide (LASIX) 20 MG tablet, Take 1 tablet (20 mg total) by mouth daily., Disp: 90 tablet, Rfl: 1 .  gabapentin (NEURONTIN) 300 MG capsule, Take 300-600 mg by mouth at bedtime. , Disp: , Rfl:  .  losartan (COZAAR) 100 MG tablet, Take 1 tablet (100 mg total) by mouth daily., Disp: 90 tablet, Rfl: 1 .  magnesium 30 MG tablet, Take 30 mg by mouth 2 (two) times daily., Disp: , Rfl:  .  OVER THE COUNTER MEDICATION, Apply 1 application topically daily as needed (pain). Red Oil, Disp: , Rfl:  .  triamcinolone cream (KENALOG) 0.1 %, APPLY 1 APPLICATION TOPICALLY DAILY AS NEEDED., Disp: 45 g, Rfl: 0 .  valACYclovir (VALTREX) 500 MG tablet, Take 1 tablet (500 mg total) by mouth daily., Disp: , Rfl:  .  VITAMIN D PO, Take 1 tablet by mouth daily., Disp: , Rfl:   EXAM:   VITALS per patient if applicable: BP of 500/37 reported last week  GENERAL: alert, oriented, appears well and in no acute distress  HEENT: atraumatic, conjunttiva clear, no obvious abnormalities on inspection of external nose and ears  NECK: normal movements of the head and neck  LUNGS: on inspection no signs of respiratory distress, breathing rate appears normal, no obvious gross increased work of breathing, gasping or wheezing  CV: no obvious cyanosis  MS: moves all visible extremities without noticeable abnormality  PSYCH/NEURO: pleasant and cooperative, no obvious depression or anxiety, speech and thought processing grossly intact  ASSESSMENT AND PLAN:   Essential hypertension  -Appears better controlled. -have advised ambulatory BP monitoring, she will purchase a BP cuff. -Continue losartan 100 and lasix 20. -She will come in for a BMET.  Herpes simplex vulvovaginitis -On daily suppressive valtrex.  Other migraine without status migrainosus, not intractable  -Migraines are well controlled with PRN OTC meds. -HA she has been having recently is different than her typical migraines. Discussed maybe a relation with her BP vs sinus HA. She will try an anti-histamine and mucinex to see if this provides any relief.  Gastroesophageal reflux disease, esophagitis presence not specified -Well controlled, not on meds.  Chronic bilateral low back pain without sciatica -She sees a "back doctor" who prescribes her gabapentin, duexis and voltaren gel. -This has been relatively well managed she thinks.     I discussed the assessment and treatment plan with the patient. The patient was provided an opportunity to ask questions and all were answered. The patient agreed with the plan and demonstrated an understanding of the instructions.   The patient was advised to call back or seek an in-person evaluation if the symptoms worsen or if the condition fails to improve as anticipated.    Lelon Frohlich, MD  [facility] at [facility]

## 2018-04-29 ENCOUNTER — Other Ambulatory Visit: Payer: Self-pay

## 2018-04-29 ENCOUNTER — Other Ambulatory Visit (INDEPENDENT_AMBULATORY_CARE_PROVIDER_SITE_OTHER): Payer: 59

## 2018-04-29 DIAGNOSIS — I10 Essential (primary) hypertension: Secondary | ICD-10-CM

## 2018-04-29 LAB — BASIC METABOLIC PANEL
BUN: 12 mg/dL (ref 6–23)
CO2: 28 mEq/L (ref 19–32)
Calcium: 9.1 mg/dL (ref 8.4–10.5)
Chloride: 105 mEq/L (ref 96–112)
Creatinine, Ser: 0.81 mg/dL (ref 0.40–1.20)
GFR: 88.4 mL/min (ref >60.00)
Glucose, Bld: 95 mg/dL (ref 70–99)
Potassium: 4 mEq/L (ref 3.5–5.1)
Sodium: 141 mEq/L (ref 135–145)

## 2018-05-01 ENCOUNTER — Ambulatory Visit: Payer: 59 | Admitting: Family Medicine

## 2018-07-09 ENCOUNTER — Other Ambulatory Visit: Payer: Self-pay | Admitting: Family Medicine

## 2018-07-29 ENCOUNTER — Other Ambulatory Visit: Payer: Self-pay

## 2018-07-29 ENCOUNTER — Ambulatory Visit (INDEPENDENT_AMBULATORY_CARE_PROVIDER_SITE_OTHER): Payer: 59 | Admitting: Internal Medicine

## 2018-07-29 DIAGNOSIS — I10 Essential (primary) hypertension: Secondary | ICD-10-CM | POA: Diagnosis not present

## 2018-07-29 DIAGNOSIS — R600 Localized edema: Secondary | ICD-10-CM | POA: Diagnosis not present

## 2018-07-29 NOTE — Progress Notes (Signed)
Virtual Visit via Video Note  I connected with Jamie [name] on 07/29/18 at  7:00 AM EDT by a video enabled telemedicine application and verified that I am speaking with the correct person using two identifiers.  Location patient: home Location provider: work office Persons participating in the virtual visit: patient, provider  I discussed the limitations of evaluation and management by telemedicine and the availability of in person appointments. The patient expressed understanding and agreed to proceed.   HPI: This is a scheduled visit for blood pressure follow-up.  Unfortunately she has not been able to check blood pressures at home since I last spoke with her in April.  In addition she is now starting to have right greater than left lower extremity edema.  She denies shortness of breath, chest pain, dizziness, palpitations or orthopnea.   ROS: Constitutional: Denies fever, chills, diaphoresis, appetite change and fatigue.  HEENT: Denies photophobia, eye pain, redness, hearing loss, ear pain, congestion, sore throat, rhinorrhea, sneezing, mouth sores, trouble swallowing, neck pain, neck stiffness and tinnitus.   Respiratory: Denies SOB, DOE, cough, chest tightness,  and wheezing.   Cardiovascular: Denies chest pain, palpitations. Gastrointestinal: Denies nausea, vomiting, abdominal pain, diarrhea, constipation, blood in stool and abdominal distention.  Genitourinary: Denies dysuria, urgency, frequency, hematuria, flank pain and difficulty urinating.  Endocrine: Denies: hot or cold intolerance, sweats, changes in hair or nails, polyuria, polydipsia. Musculoskeletal: Denies myalgias, back pain, joint swelling, arthralgias and gait problem.  Skin: Denies pallor, rash and wound.  Neurological: Denies dizziness, seizures, syncope, weakness, light-headedness, numbness and headaches.  Hematological: Denies adenopathy. Easy bruising, personal or family bleeding history   Psychiatric/Behavioral: Denies suicidal ideation, mood changes, confusion, nervousness, sleep disturbance and agitation   Past Medical History:  Diagnosis Date  . Allergy   . Arthritis    in back  . Chest pain    neg cath 2016 after false positive myoview  . Chest pain   . Depression   . GERD (gastroesophageal reflux disease)   . High blood pressure   . Hyperglycemia   . Hyperlipidemia   . Hypertension   . Joint pain   . Knee pain   . Low back pain   . Migraine   . Mitral valve prolapse   . Muscle cramps     Past Surgical History:  Procedure Laterality Date  . BACK SURGERY    . CESAREAN SECTION    . LEFT HEART CATHETERIZATION WITH CORONARY ANGIOGRAM N/A 07/08/2013   Procedure: LEFT HEART CATHETERIZATION WITH CORONARY ANGIOGRAM;  Surgeon: Josue Hector, MD;  Location: [facility] CATH LAB;  Service: Cardiovascular;  Laterality: N/A;  . TUBAL LIGATION      Family History  Problem Relation Age of Onset  . Hyperlipidemia Mother   . Hypertension Mother   . AAA (abdominal aortic aneurysm) Mother   . Stroke Father   . Hypertension Father   . Hyperlipidemia Brother        x2  . Hypertension Brother        x2  . Liver cancer Maternal Aunt   . Breast cancer Cousin   . Colon cancer Cousin   . Colon polyps Neg Hx   . Esophageal cancer Neg Hx   . Stomach cancer Neg Hx   . Rectal cancer Neg Hx     SOCIAL HX:   reports that she quit smoking about 22 years ago. Her smoking use included cigarettes. She has never used smokeless tobacco. She reports current alcohol use.  She reports that she does not use drugs.   Current Outpatient Medications:  .  acetaminophen (TYLENOL) 500 MG tablet, Take 2 tablets (1,000 mg total) by mouth at bedtime., Disp: 30 tablet, Rfl: 0 .  diclofenac sodium (VOLTAREN) 1 % GEL, Apply 2 g topically 4 (four) times daily as needed (pain)., Disp: , Rfl:  .  furosemide (LASIX) 20 MG tablet, Take 1 tablet (20 mg total) by mouth daily., Disp: 90 tablet, Rfl: 1 .   gabapentin (NEURONTIN) 300 MG capsule, Take 300-600 mg by mouth at bedtime. , Disp: , Rfl:  .  Ibuprofen-Famotidine (DUEXIS) 800-26.6 MG TABS, Take 1 tablet by mouth daily., Disp: 90 tablet, Rfl:  .  losartan (COZAAR) 100 MG tablet, Take 1 tablet (100 mg total) by mouth daily., Disp: 90 tablet, Rfl: 1 .  magnesium 30 MG tablet, Take 30 mg by mouth 2 (two) times daily., Disp: , Rfl:  .  OVER THE COUNTER MEDICATION, Apply 1 application topically daily as needed (pain). Red Oil, Disp: , Rfl:  .  triamcinolone cream (KENALOG) 0.1 %, APPLY 1 APPLICATION TOPICALLY DAILY AS NEEDED., Disp: 45 g, Rfl: 0 .  valACYclovir (VALTREX) 500 MG tablet, Take 1 tablet (500 mg total) by mouth daily., Disp: , Rfl:  .  VITAMIN D PO, Take 1 tablet by mouth daily., Disp: , Rfl:   EXAM:   VITALS per patient if applicable: None reported  GENERAL: alert, oriented, appears well and in no acute distress  HEENT: atraumatic, conjunttiva clear, no obvious abnormalities on inspection of external nose and ears  NECK: normal movements of the head and neck  LUNGS: on inspection no signs of respiratory distress, breathing rate appears normal, no obvious gross increased work of breathing, gasping or wheezing  CV: no obvious cyanosis  MS: moves all visible extremities without noticeable abnormality  PSYCH/NEURO: pleasant and cooperative, no obvious depression or anxiety, speech and thought processing grossly intact  ASSESSMENT AND PLAN:   Essential hypertension  -Patient is unsure of recent blood pressure readings, however I am concerned that it may be elevated given her ongoing lower extremity edema. -Patient will schedule follow-up to come in office for blood pressure check.  Edema, lower extremity -Needs to come in for exam and blood work, she will schedule.     I discussed the assessment and treatment plan with the patient. The patient was provided an opportunity to ask questions and all were answered. The  patient agreed with the plan and demonstrated an understanding of the instructions.   The patient was advised to call back or seek an in-person evaluation if the symptoms worsen or if the condition fails to improve as anticipated.    Lelon Frohlich, MD  [facility] at [facility]

## 2018-07-30 ENCOUNTER — Telehealth: Payer: Self-pay | Admitting: *Deleted

## 2018-07-30 NOTE — Telephone Encounter (Signed)
Left message on machine for patient to schedule a follow up appointment.

## 2018-08-14 ENCOUNTER — Encounter: Payer: Self-pay | Admitting: Internal Medicine

## 2018-08-14 ENCOUNTER — Other Ambulatory Visit: Payer: Self-pay

## 2018-08-14 ENCOUNTER — Ambulatory Visit (INDEPENDENT_AMBULATORY_CARE_PROVIDER_SITE_OTHER): Payer: 59 | Admitting: Internal Medicine

## 2018-08-14 VITALS — BP 150/100 | HR 78 | Temp 98.3°F | Wt 294.1 lb

## 2018-08-14 DIAGNOSIS — R600 Localized edema: Secondary | ICD-10-CM | POA: Diagnosis not present

## 2018-08-14 DIAGNOSIS — I10 Essential (primary) hypertension: Secondary | ICD-10-CM

## 2018-08-14 DIAGNOSIS — E119 Type 2 diabetes mellitus without complications: Secondary | ICD-10-CM | POA: Insufficient documentation

## 2018-08-14 DIAGNOSIS — R7302 Impaired glucose tolerance (oral): Secondary | ICD-10-CM | POA: Insufficient documentation

## 2018-08-14 DIAGNOSIS — R252 Cramp and spasm: Secondary | ICD-10-CM

## 2018-08-14 DIAGNOSIS — R079 Chest pain, unspecified: Secondary | ICD-10-CM

## 2018-08-14 DIAGNOSIS — E785 Hyperlipidemia, unspecified: Secondary | ICD-10-CM

## 2018-08-14 LAB — CBC WITH DIFFERENTIAL/PLATELET
Basophils Absolute: 0 10*3/uL (ref 0.0–0.1)
Basophils Relative: 0.7 % (ref 0.0–3.0)
Eosinophils Absolute: 0.2 10*3/uL (ref 0.0–0.7)
Eosinophils Relative: 4.1 % (ref 0.0–5.0)
HCT: 39 % (ref 36.0–46.0)
Hemoglobin: 12.8 g/dL (ref 12.0–15.0)
Lymphocytes Relative: 37 % (ref 12.0–46.0)
Lymphs Abs: 1.8 10*3/uL (ref 0.7–4.0)
MCHC: 32.9 g/dL (ref 30.0–36.0)
MCV: 89 fl (ref 78.0–100.0)
Monocytes Absolute: 0.3 10*3/uL (ref 0.1–1.0)
Monocytes Relative: 5.8 % (ref 3.0–12.0)
Neutro Abs: 2.5 10*3/uL (ref 1.4–7.7)
Neutrophils Relative %: 52.4 % (ref 43.0–77.0)
Platelets: 229 10*3/uL (ref 150.0–400.0)
RBC: 4.38 Mil/uL (ref 3.87–5.11)
RDW: 15.2 % (ref 11.5–15.5)
WBC: 4.8 10*3/uL (ref 4.0–10.5)

## 2018-08-14 LAB — COMPREHENSIVE METABOLIC PANEL
ALT: 16 U/L (ref 0–35)
AST: 14 U/L (ref 0–37)
Albumin: 4.3 g/dL (ref 3.5–5.2)
Alkaline Phosphatase: 74 U/L (ref 39–117)
BUN: 13 mg/dL (ref 6–23)
CO2: 29 mEq/L (ref 19–32)
Calcium: 9.4 mg/dL (ref 8.4–10.5)
Chloride: 106 mEq/L (ref 96–112)
Creatinine, Ser: 0.82 mg/dL (ref 0.40–1.20)
GFR: 87.06 mL/min (ref >60.00)
Glucose, Bld: 104 mg/dL — ABNORMAL HIGH (ref 70–99)
Potassium: 4 mEq/L (ref 3.5–5.1)
Sodium: 142 mEq/L (ref 135–145)
Total Bilirubin: 0.5 mg/dL (ref 0.2–1.2)
Total Protein: 6.9 g/dL (ref 6.0–8.3)

## 2018-08-14 LAB — LIPID PANEL
Cholesterol: 194 mg/dL (ref 0–200)
HDL: 51.1 mg/dL (ref >39.00)
LDL Cholesterol: 124 mg/dL — ABNORMAL HIGH (ref 0–99)
NonHDL: 143.12
Total CHOL/HDL Ratio: 4
Triglycerides: 94 mg/dL (ref 0.0–149.0)
VLDL: 18.8 mg/dL (ref 0.0–40.0)

## 2018-08-14 LAB — VITAMIN D 25 HYDROXY (VIT D DEFICIENCY, FRACTURES): VITD: 47.24 ng/mL (ref 30.00–100.00)

## 2018-08-14 LAB — TSH: TSH: 2.22 u[IU]/mL (ref 0.35–4.50)

## 2018-08-14 LAB — HEMOGLOBIN A1C: Hgb A1c MFr Bld: 6 % (ref 4.6–6.5)

## 2018-08-14 LAB — VITAMIN B12: Vitamin B-12: 293 pg/mL (ref 211–911)

## 2018-08-14 NOTE — Patient Instructions (Addendum)
-Nice meeting you today!!  -Lab work today; will notify you once results are available.  -Echocardiogram will be scheduled.  -Low salt diet, more information below.  -Please make sure you take your losartan every day.  -Schedule follow up in 4 weeks.   Hypertension, Adult Hypertension is another name for high blood pressure. High blood pressure forces your heart to work harder to pump blood. This can cause problems over time. There are two numbers in a blood pressure reading. There is a top number (systolic) over a bottom number (diastolic). It is best to have a blood pressure that is below 120/80. Healthy choices can help lower your blood pressure, or you may need medicine to help lower it. What are the causes? The cause of this condition is not known. Some conditions may be related to high blood pressure. What increases the risk?  Smoking.  Having type 2 diabetes mellitus, high cholesterol, or both.  Not getting enough exercise or physical activity.  Being overweight.  Having too much fat, sugar, calories, or salt (sodium) in your diet.  Drinking too much alcohol.  Having long-term (chronic) kidney disease.  Having a family history of high blood pressure.  Age. Risk increases with age.  Race. You may be at higher risk if you are African American.  Gender. Men are at higher risk than women before age 35. After age 4, women are at higher risk than men.  Having obstructive sleep apnea.  Stress. What are the signs or symptoms?  High blood pressure may not cause symptoms. Very high blood pressure (hypertensive crisis) may cause: ? Headache. ? Feelings of worry or nervousness (anxiety). ? Shortness of breath. ? Nosebleed. ? A feeling of being sick to your stomach (nausea). ? Throwing up (vomiting). ? Changes in how you see. ? Very bad chest pain. ? Seizures. How is this treated?  This condition is treated by making healthy lifestyle changes, such as: ? Eating  healthy foods. ? Exercising more. ? Drinking less alcohol.  Your health care provider may prescribe medicine if lifestyle changes are not enough to get your blood pressure under control, and if: ? Your top number is above 130. ? Your bottom number is above 80.  Your personal target blood pressure may vary. Follow these instructions at home: Eating and drinking   If told, follow the DASH eating plan. To follow this plan: ? Fill one half of your plate at each meal with fruits and vegetables. ? Fill one fourth of your plate at each meal with whole grains. Whole grains include whole-wheat pasta, brown rice, and whole-grain bread. ? Eat or drink low-fat dairy products, such as skim milk or low-fat yogurt. ? Fill one fourth of your plate at each meal with low-fat (lean) proteins. Low-fat proteins include fish, chicken without skin, eggs, beans, and tofu. ? Avoid fatty meat, cured and processed meat, or chicken with skin. ? Avoid pre-made or processed food.  Eat less than 1,500 mg of salt each day.  Do not drink alcohol if: ? Your doctor tells you not to drink. ? You are pregnant, may be pregnant, or are planning to become pregnant.  If you drink alcohol: ? Limit how much you use to:  0-1 drink a day for women.  0-2 drinks a day for men. ? Be aware of how much alcohol is in your drink. In the [place] one drink equals one 12 oz bottle of beer (355 mL), one 5 oz glass of wine (148 mL),  or one 1 oz glass of hard liquor (44 mL). Lifestyle   Work with your doctor to stay at a healthy weight or to lose weight. Ask your doctor what the best weight is for you.  Get at least 30 minutes of exercise most days of the week. This may include walking, swimming, or biking.  Get at least 30 minutes of exercise that strengthens your muscles (resistance exercise) at least 3 days a week. This may include lifting weights or doing Pilates.  Do not use any products that contain nicotine or tobacco, such  as cigarettes, e-cigarettes, and chewing tobacco. If you need help quitting, ask your doctor.  Check your blood pressure at home as told by your doctor.  Keep all follow-up visits as told by your doctor. This is important. Medicines  Take over-the-counter and prescription medicines only as told by your doctor. Follow directions carefully.  Do not skip doses of blood pressure medicine. The medicine does not work as well if you skip doses. Skipping doses also puts you at risk for problems.  Ask your doctor about side effects or reactions to medicines that you should watch for. Contact a doctor if you:  Think you are having a reaction to the medicine you are taking.  Have headaches that keep coming back (recurring).  Feel dizzy.  Have swelling in your ankles.  Have trouble with your vision. Get help right away if you:  Get a very bad headache.  Start to feel mixed up (confused).  Feel weak or numb.  Feel faint.  Have very bad pain in your: ? Chest. ? Belly (abdomen).  Throw up more than once.  Have trouble breathing. Summary  Hypertension is another name for high blood pressure.  High blood pressure forces your heart to work harder to pump blood.  For most people, a normal blood pressure is less than 120/80.  Making healthy choices can help lower blood pressure. If your blood pressure does not get lower with healthy choices, you may need to take medicine. This information is not intended to replace advice given to you by your health care provider. Make sure you discuss any questions you have with your health care provider. Document Released: 06/06/2007 Document Revised: 08/28/2017 Document Reviewed: 08/28/2017 Elsevier Patient Education  2020 [place] DASH stands for "Dietary Approaches to Stop Hypertension." The DASH eating plan is a healthy eating plan that has been shown to reduce high blood pressure (hypertension). It may also reduce your  risk for type 2 diabetes, heart disease, and stroke. The DASH eating plan may also help with weight loss. What are tips for following this plan?  General guidelines  Avoid eating more than 2,300 mg (milligrams) of salt (sodium) a day. If you have hypertension, you may need to reduce your sodium intake to 1,500 mg a day.  Limit alcohol intake to no more than 1 drink a day for nonpregnant women and 2 drinks a day for men. One drink equals 12 oz of beer, 5 oz of wine, or 1 oz of hard liquor.  Work with your health care provider to maintain a healthy body weight or to lose weight. Ask what an ideal weight is for you.  Get at least 30 minutes of exercise that causes your heart to beat faster (aerobic exercise) most days of the week. Activities may include walking, swimming, or biking.  Work with your health care provider or diet and nutrition specialist (dietitian) to adjust your eating  plan to your individual calorie needs. Reading food labels   Check food labels for the amount of sodium per serving. Choose foods with less than 5 percent of the Daily Value of sodium. Generally, foods with less than 300 mg of sodium per serving fit into this eating plan.  To find whole grains, look for the word "whole" as the first word in the ingredient list. Shopping  Buy products labeled as "low-sodium" or "no salt added."  Buy fresh foods. Avoid canned foods and premade or frozen meals. Cooking  Avoid adding salt when cooking. Use salt-free seasonings or herbs instead of table salt or sea salt. Check with your health care provider or pharmacist before using salt substitutes.  Do not fry foods. Cook foods using healthy methods such as baking, boiling, grilling, and broiling instead.  Cook with heart-healthy oils, such as olive, canola, soybean, or sunflower oil. Meal planning  Eat a balanced diet that includes: ? 5 or more servings of fruits and vegetables each day. At each meal, try to fill half of  your plate with fruits and vegetables. ? Up to 6-8 servings of whole grains each day. ? Less than 6 oz of lean meat, poultry, or fish each day. A 3-oz serving of meat is about the same size as a deck of cards. One egg equals 1 oz. ? 2 servings of low-fat dairy each day. ? A serving of nuts, seeds, or beans 5 times each week. ? Heart-healthy fats. Healthy fats called Omega-3 fatty acids are found in foods such as flaxseeds and coldwater fish, like sardines, salmon, and mackerel.  Limit how much you eat of the following: ? Canned or prepackaged foods. ? Food that is high in trans fat, such as fried foods. ? Food that is high in saturated fat, such as fatty meat. ? Sweets, desserts, sugary drinks, and other foods with added sugar. ? Full-fat dairy products.  Do not salt foods before eating.  Try to eat at least 2 vegetarian meals each week.  Eat more home-cooked food and less restaurant, buffet, and fast food.  When eating at a restaurant, ask that your food be prepared with less salt or no salt, if possible. What foods are recommended? The items listed may not be a complete list. Talk with your dietitian about what dietary choices are best for you. Grains Whole-grain or whole-wheat bread. Whole-grain or whole-wheat pasta. Brown rice. [place]. Bulgur. Whole-grain and low-sodium cereals. Pita bread. Low-fat, low-sodium crackers. Whole-wheat flour tortillas. Vegetables Fresh or frozen vegetables (raw, steamed, roasted, or grilled). Low-sodium or reduced-sodium tomato and vegetable juice. Low-sodium or reduced-sodium tomato sauce and tomato paste. Low-sodium or reduced-sodium canned vegetables. Fruits All fresh, dried, or frozen fruit. Canned fruit in natural juice (without added sugar). Meat and other protein foods Skinless chicken or [place]. Ground chicken or [place]. Pork with fat trimmed off. Fish and seafood. Egg whites. Dried beans, peas, or lentils. Unsalted nuts, nut butters,  and seeds. Unsalted canned beans. Lean cuts of beef with fat trimmed off. Low-sodium, lean deli meat. Dairy Low-fat (1%) or fat-free (skim) milk. Fat-free, low-fat, or reduced-fat cheeses. Nonfat, low-sodium ricotta or cottage cheese. Low-fat or nonfat yogurt. Low-fat, low-sodium cheese. Fats and oils Soft margarine without trans fats. Vegetable oil. Low-fat, reduced-fat, or light mayonnaise and salad dressings (reduced-sodium). Canola, safflower, olive, soybean, and sunflower oils. Avocado. Seasoning and other foods Herbs. Spices. Seasoning mixes without salt. Unsalted popcorn and pretzels. Fat-free sweets. What foods are not recommended? The items listed  may not be a complete list. Talk with your dietitian about what dietary choices are best for you. Grains Baked goods made with fat, such as croissants, muffins, or some breads. Dry pasta or rice meal packs. Vegetables Creamed or fried vegetables. Vegetables in a cheese sauce. Regular canned vegetables (not low-sodium or reduced-sodium). Regular canned tomato sauce and paste (not low-sodium or reduced-sodium). Regular tomato and vegetable juice (not low-sodium or reduced-sodium). Angie Fava. Olives. Fruits Canned fruit in a light or heavy syrup. Fried fruit. Fruit in cream or butter sauce. Meat and other protein foods Fatty cuts of meat. Ribs. Fried meat. Berniece Salines. Sausage. Bologna and other processed lunch meats. Salami. Fatback. Hotdogs. Bratwurst. Salted nuts and seeds. Canned beans with added salt. Canned or smoked fish. Whole eggs or egg yolks. Chicken or [place] with skin. Dairy Whole or 2% milk, cream, and half-and-half. Whole or full-fat cream cheese. Whole-fat or sweetened yogurt. Full-fat cheese. Nondairy creamers. Whipped toppings. Processed cheese and cheese spreads. Fats and oils Butter. Stick margarine. Lard. Shortening. Ghee. Bacon fat. Tropical oils, such as coconut, palm kernel, or palm oil. Seasoning and other foods Salted popcorn  and pretzels. Onion salt, garlic salt, seasoned salt, table salt, and sea salt. Worcestershire sauce. Tartar sauce. Barbecue sauce. Teriyaki sauce. Soy sauce, including reduced-sodium. Steak sauce. Canned and packaged gravies. Fish sauce. Oyster sauce. Cocktail sauce. Horseradish that you find on the shelf. Ketchup. Mustard. Meat flavorings and tenderizers. Bouillon cubes. Hot sauce and Tabasco sauce. Premade or packaged marinades. Premade or packaged taco seasonings. Relishes. Regular salad dressings. Where to find more information:  National Heart, Lung, and [place]: https://wilson-eaton.com/  American Heart Association: www.heart.org Summary  The DASH eating plan is a healthy eating plan that has been shown to reduce high blood pressure (hypertension). It may also reduce your risk for type 2 diabetes, heart disease, and stroke.  With the DASH eating plan, you should limit salt (sodium) intake to 2,300 mg a day. If you have hypertension, you may need to reduce your sodium intake to 1,500 mg a day.  When on the DASH eating plan, aim to eat more fresh fruits and vegetables, whole grains, lean proteins, low-fat dairy, and heart-healthy fats.  Work with your health care provider or diet and nutrition specialist (dietitian) to adjust your eating plan to your individual calorie needs. This information is not intended to replace advice given to you by your health care provider. Make sure you discuss any questions you have with your health care provider. Document Released: 12/07/2010 Document Revised: 11/30/2016 Document Reviewed: 12/12/2015 Elsevier Patient Education  2020 [employer].

## 2018-08-14 NOTE — Progress Notes (Signed)
Established Patient Office Visit     CC/Reason for Visit: Blood pressure and lower extremity edema follow-up  HPI: Jamie [name] is a 58 y.o. female who is coming in today for the above mentioned reasons. Past Medical History is significant for: Uncontrolled hypertension, lower extremity edema that has been present since about January for which she has been taking as needed Lasix, morbid obesity and hyperlipidemia not on medications.  She comes in today after a video visit for blood pressure follow-up.  She continues to have right greater than left lower extremity edema, on occasion has mild dyspnea on exertion.  She has also been experiencing leg cramps at night that keep her awake.  She admits to not being very diet compliant, also does not take losartan on a regular basis.   Past Medical/Surgical History: Past Medical History:  Diagnosis Date  . Allergy   . Arthritis    in back  . Chest pain    neg cath 2016 after false positive myoview  . Chest pain   . Depression   . GERD (gastroesophageal reflux disease)   . High blood pressure   . Hyperglycemia   . Hyperlipidemia   . Hypertension   . Joint pain   . Knee pain   . Low back pain   . Migraine   . Mitral valve prolapse   . Muscle cramps     Past Surgical History:  Procedure Laterality Date  . BACK SURGERY    . CESAREAN SECTION    . LEFT HEART CATHETERIZATION WITH CORONARY ANGIOGRAM N/A 07/08/2013   Procedure: LEFT HEART CATHETERIZATION WITH CORONARY ANGIOGRAM;  Surgeon: Josue Hector, MD;  Location: [facility] CATH LAB;  Service: Cardiovascular;  Laterality: N/A;  . TUBAL LIGATION      Social History:  reports that she quit smoking about 22 years ago. Her smoking use included cigarettes. She has never used smokeless tobacco. She reports current alcohol use. She reports that she does not use drugs.  Allergies: No Known Allergies  Family History:  Family History  Problem Relation Age of Onset  . Hyperlipidemia Mother    . Hypertension Mother   . AAA (abdominal aortic aneurysm) Mother   . Stroke Father   . Hypertension Father   . Hyperlipidemia Brother        x2  . Hypertension Brother        x2  . Liver cancer Maternal Aunt   . Breast cancer Cousin   . Colon cancer Cousin   . Colon polyps Neg Hx   . Esophageal cancer Neg Hx   . Stomach cancer Neg Hx   . Rectal cancer Neg Hx      Current Outpatient Medications:  .  acetaminophen (TYLENOL) 500 MG tablet, Take 2 tablets (1,000 mg total) by mouth at bedtime., Disp: 30 tablet, Rfl: 0 .  diclofenac sodium (VOLTAREN) 1 % GEL, Apply 2 g topically 4 (four) times daily as needed (pain)., Disp: , Rfl:  .  furosemide (LASIX) 20 MG tablet, Take 1 tablet (20 mg total) by mouth daily., Disp: 90 tablet, Rfl: 1 .  gabapentin (NEURONTIN) 300 MG capsule, Take 300-600 mg by mouth at bedtime. , Disp: , Rfl:  .  Ibuprofen-Famotidine (DUEXIS) 800-26.6 MG TABS, Take 1 tablet by mouth daily., Disp: 90 tablet, Rfl:  .  losartan (COZAAR) 100 MG tablet, Take 1 tablet (100 mg total) by mouth daily., Disp: 90 tablet, Rfl: 1 .  magnesium 30 MG tablet, Take  30 mg by mouth 2 (two) times daily., Disp: , Rfl:  .  OVER THE COUNTER MEDICATION, Apply 1 application topically daily as needed (pain). Red Oil, Disp: , Rfl:  .  triamcinolone cream (KENALOG) 0.1 %, APPLY 1 APPLICATION TOPICALLY DAILY AS NEEDED., Disp: 45 g, Rfl: 0 .  valACYclovir (VALTREX) 500 MG tablet, Take 1 tablet (500 mg total) by mouth daily., Disp: , Rfl:  .  VITAMIN D PO, Take 1 tablet by mouth daily., Disp: , Rfl:   Review of Systems:  Constitutional: Denies fever, chills, diaphoresis, appetite change and fatigue.  HEENT: Denies photophobia, eye pain, redness, hearing loss, ear pain, congestion, sore throat, rhinorrhea, sneezing, mouth sores, trouble swallowing, neck pain, neck stiffness and tinnitus.   Respiratory: Denies SOB, cough, chest tightness,  and wheezing.   Cardiovascular: Denies chest pain,  palpitations. Gastrointestinal: Denies nausea, vomiting, abdominal pain, diarrhea, constipation, blood in stool and abdominal distention.  Genitourinary: Denies dysuria, urgency, frequency, hematuria, flank pain and difficulty urinating.  Endocrine: Denies: hot or cold intolerance, sweats, changes in hair or nails, polyuria, polydipsia. Musculoskeletal: Denies myalgias, back pain, joint swelling, arthralgias and gait problem.  Skin: Denies pallor, rash and wound.  Neurological: Denies dizziness, seizures, syncope, weakness, light-headedness, numbness and headaches.  Hematological: Denies adenopathy. Easy bruising, personal or family bleeding history  Psychiatric/Behavioral: Denies suicidal ideation, mood changes, confusion, nervousness, sleep disturbance and agitation    Physical Exam: Vitals:   08/14/18 0729  BP: (!) 150/100  Pulse: 78  Temp: 98.3 F (36.8 C)  TempSrc: Temporal  SpO2: 95%  Weight: 294 lb 1.6 oz (133.4 kg)    Body mass index is 41.6 kg/m.   Constitutional: NAD, calm, comfortable Eyes: PERRL, lids and conjunctivae normal ENMT: Mucous membranes are moist.  Respiratory: clear to auscultation bilaterally, no wheezing, no crackles. Normal respiratory effort. No accessory muscle use.  Cardiovascular: Regular rate and rhythm, no murmurs / rubs / gallops.  1-2+ pitting edema bilaterally. 2+ pedal pulses. No carotid bruits.  Abdomen: no tenderness, no masses palpated. No hepatosplenomegaly. Bowel sounds positive.  Musculoskeletal: no clubbing / cyanosis. No joint deformity upper and lower extremities. Good ROM, no contractures. Normal muscle tone.  Skin: no rashes, lesions, ulcers. No induration, apparent varicose veins Neurologic: Grossly intact and nonfocal Psychiatric: Normal judgment and insight. Alert and oriented x 3. Normal mood.    Impression and Plan:  Essential hypertension  Bilateral leg edema  Leg cramps -Blood pressure remains quite uncontrolled,  suspect that some of her lower extremity edema may be due to diastolic heart failure. -EKG done in office and interpreted by myself: Normal sinus rhythm, normal axis, no acute ST-T wave changes -She is not taking losartan consistently, not compliant with low-sodium diet. -She will make greater efforts to taking losartan consistently, will return in 4 weeks for blood pressure follow-up. -Discussed heart healthy diet in detail. -Will request 2D echo to evaluate systolic and diastolic function due to her uncontrolled hypertension and lower extremity edema. -Wonder if her leg cramps may be due to hypokalemia as she has been taking as needed Lasix, check labs today.  Morbid obesity ([place])  -Discussed healthy lifestyle, including increased physical activity and better food choices to promote weight loss.  Hyperlipidemia, unspecified hyperlipidemia type  -Last LDL was 121 in 2019, recheck lipids today, she is not on medication.     Patient Instructions  -Nice meeting you today!!  -Lab work today; will notify you once results are available.  -Echocardiogram will be scheduled.  -  Low salt diet, more information below.  -Please make sure you take your losartan every day.  -Schedule follow up in 4 weeks.   Hypertension, Adult Hypertension is another name for high blood pressure. High blood pressure forces your heart to work harder to pump blood. This can cause problems over time. There are two numbers in a blood pressure reading. There is a top number (systolic) over a bottom number (diastolic). It is best to have a blood pressure that is below 120/80. Healthy choices can help lower your blood pressure, or you may need medicine to help lower it. What are the causes? The cause of this condition is not known. Some conditions may be related to high blood pressure. What increases the risk?  Smoking.  Having type 2 diabetes mellitus, high cholesterol, or both.  Not getting enough exercise or  physical activity.  Being overweight.  Having too much fat, sugar, calories, or salt (sodium) in your diet.  Drinking too much alcohol.  Having long-term (chronic) kidney disease.  Having a family history of high blood pressure.  Age. Risk increases with age.  Race. You may be at higher risk if you are African American.  Gender. Men are at higher risk than women before age 52. After age 65, women are at higher risk than men.  Having obstructive sleep apnea.  Stress. What are the signs or symptoms?  High blood pressure may not cause symptoms. Very high blood pressure (hypertensive crisis) may cause: ? Headache. ? Feelings of worry or nervousness (anxiety). ? Shortness of breath. ? Nosebleed. ? A feeling of being sick to your stomach (nausea). ? Throwing up (vomiting). ? Changes in how you see. ? Very bad chest pain. ? Seizures. How is this treated?  This condition is treated by making healthy lifestyle changes, such as: ? Eating healthy foods. ? Exercising more. ? Drinking less alcohol.  Your health care provider may prescribe medicine if lifestyle changes are not enough to get your blood pressure under control, and if: ? Your top number is above 130. ? Your bottom number is above 80.  Your personal target blood pressure may vary. Follow these instructions at home: Eating and drinking   If told, follow the DASH eating plan. To follow this plan: ? Fill one half of your plate at each meal with fruits and vegetables. ? Fill one fourth of your plate at each meal with whole grains. Whole grains include whole-wheat pasta, brown rice, and whole-grain bread. ? Eat or drink low-fat dairy products, such as skim milk or low-fat yogurt. ? Fill one fourth of your plate at each meal with low-fat (lean) proteins. Low-fat proteins include fish, chicken without skin, eggs, beans, and tofu. ? Avoid fatty meat, cured and processed meat, or chicken with skin. ? Avoid pre-made or  processed food.  Eat less than 1,500 mg of salt each day.  Do not drink alcohol if: ? Your doctor tells you not to drink. ? You are pregnant, may be pregnant, or are planning to become pregnant.  If you drink alcohol: ? Limit how much you use to:  0-1 drink a day for women.  0-2 drinks a day for men. ? Be aware of how much alcohol is in your drink. In the [place] one drink equals one 12 oz bottle of beer (355 mL), one 5 oz glass of wine (148 mL), or one 1 oz glass of hard liquor (44 mL). Lifestyle   Work with your doctor to stay at  a healthy weight or to lose weight. Ask your doctor what the best weight is for you.  Get at least 30 minutes of exercise most days of the week. This may include walking, swimming, or biking.  Get at least 30 minutes of exercise that strengthens your muscles (resistance exercise) at least 3 days a week. This may include lifting weights or doing Pilates.  Do not use any products that contain nicotine or tobacco, such as cigarettes, e-cigarettes, and chewing tobacco. If you need help quitting, ask your doctor.  Check your blood pressure at home as told by your doctor.  Keep all follow-up visits as told by your doctor. This is important. Medicines  Take over-the-counter and prescription medicines only as told by your doctor. Follow directions carefully.  Do not skip doses of blood pressure medicine. The medicine does not work as well if you skip doses. Skipping doses also puts you at risk for problems.  Ask your doctor about side effects or reactions to medicines that you should watch for. Contact a doctor if you:  Think you are having a reaction to the medicine you are taking.  Have headaches that keep coming back (recurring).  Feel dizzy.  Have swelling in your ankles.  Have trouble with your vision. Get help right away if you:  Get a very bad headache.  Start to feel mixed up (confused).  Feel weak or numb.  Feel faint.  Have very  bad pain in your: ? Chest. ? Belly (abdomen).  Throw up more than once.  Have trouble breathing. Summary  Hypertension is another name for high blood pressure.  High blood pressure forces your heart to work harder to pump blood.  For most people, a normal blood pressure is less than 120/80.  Making healthy choices can help lower blood pressure. If your blood pressure does not get lower with healthy choices, you may need to take medicine. This information is not intended to replace advice given to you by your health care provider. Make sure you discuss any questions you have with your health care provider. Document Released: 06/06/2007 Document Revised: 08/28/2017 Document Reviewed: 08/28/2017 Elsevier Patient Education  2020 [place] DASH stands for "Dietary Approaches to Stop Hypertension." The DASH eating plan is a healthy eating plan that has been shown to reduce high blood pressure (hypertension). It may also reduce your risk for type 2 diabetes, heart disease, and stroke. The DASH eating plan may also help with weight loss. What are tips for following this plan?  General guidelines  Avoid eating more than 2,300 mg (milligrams) of salt (sodium) a day. If you have hypertension, you may need to reduce your sodium intake to 1,500 mg a day.  Limit alcohol intake to no more than 1 drink a day for nonpregnant women and 2 drinks a day for men. One drink equals 12 oz of beer, 5 oz of wine, or 1 oz of hard liquor.  Work with your health care provider to maintain a healthy body weight or to lose weight. Ask what an ideal weight is for you.  Get at least 30 minutes of exercise that causes your heart to beat faster (aerobic exercise) most days of the week. Activities may include walking, swimming, or biking.  Work with your health care provider or diet and nutrition specialist (dietitian) to adjust your eating plan to your individual calorie needs. Reading food  labels   Check food labels for the amount of sodium per  serving. Choose foods with less than 5 percent of the Daily Value of sodium. Generally, foods with less than 300 mg of sodium per serving fit into this eating plan.  To find whole grains, look for the word "whole" as the first word in the ingredient list. Shopping  Buy products labeled as "low-sodium" or "no salt added."  Buy fresh foods. Avoid canned foods and premade or frozen meals. Cooking  Avoid adding salt when cooking. Use salt-free seasonings or herbs instead of table salt or sea salt. Check with your health care provider or pharmacist before using salt substitutes.  Do not fry foods. Cook foods using healthy methods such as baking, boiling, grilling, and broiling instead.  Cook with heart-healthy oils, such as olive, canola, soybean, or sunflower oil. Meal planning  Eat a balanced diet that includes: ? 5 or more servings of fruits and vegetables each day. At each meal, try to fill half of your plate with fruits and vegetables. ? Up to 6-8 servings of whole grains each day. ? Less than 6 oz of lean meat, poultry, or fish each day. A 3-oz serving of meat is about the same size as a deck of cards. One egg equals 1 oz. ? 2 servings of low-fat dairy each day. ? A serving of nuts, seeds, or beans 5 times each week. ? Heart-healthy fats. Healthy fats called Omega-3 fatty acids are found in foods such as flaxseeds and coldwater fish, like sardines, salmon, and mackerel.  Limit how much you eat of the following: ? Canned or prepackaged foods. ? Food that is high in trans fat, such as fried foods. ? Food that is high in saturated fat, such as fatty meat. ? Sweets, desserts, sugary drinks, and other foods with added sugar. ? Full-fat dairy products.  Do not salt foods before eating.  Try to eat at least 2 vegetarian meals each week.  Eat more home-cooked food and less restaurant, buffet, and fast food.  When eating at a  restaurant, ask that your food be prepared with less salt or no salt, if possible. What foods are recommended? The items listed may not be a complete list. Talk with your dietitian about what dietary choices are best for you. Grains Whole-grain or whole-wheat bread. Whole-grain or whole-wheat pasta. Brown rice. [place]. Bulgur. Whole-grain and low-sodium cereals. Pita bread. Low-fat, low-sodium crackers. Whole-wheat flour tortillas. Vegetables Fresh or frozen vegetables (raw, steamed, roasted, or grilled). Low-sodium or reduced-sodium tomato and vegetable juice. Low-sodium or reduced-sodium tomato sauce and tomato paste. Low-sodium or reduced-sodium canned vegetables. Fruits All fresh, dried, or frozen fruit. Canned fruit in natural juice (without added sugar). Meat and other protein foods Skinless chicken or [place]. Ground chicken or [place]. Pork with fat trimmed off. Fish and seafood. Egg whites. Dried beans, peas, or lentils. Unsalted nuts, nut butters, and seeds. Unsalted canned beans. Lean cuts of beef with fat trimmed off. Low-sodium, lean deli meat. Dairy Low-fat (1%) or fat-free (skim) milk. Fat-free, low-fat, or reduced-fat cheeses. Nonfat, low-sodium ricotta or cottage cheese. Low-fat or nonfat yogurt. Low-fat, low-sodium cheese. Fats and oils Soft margarine without trans fats. Vegetable oil. Low-fat, reduced-fat, or light mayonnaise and salad dressings (reduced-sodium). Canola, safflower, olive, soybean, and sunflower oils. Avocado. Seasoning and other foods Herbs. Spices. Seasoning mixes without salt. Unsalted popcorn and pretzels. Fat-free sweets. What foods are not recommended? The items listed may not be a complete list. Talk with your dietitian about what dietary choices are best for you. Grains [employer]  made with fat, such as croissants, muffins, or some breads. Dry pasta or rice meal packs. Vegetables Creamed or fried vegetables. Vegetables in a cheese sauce.  Regular canned vegetables (not low-sodium or reduced-sodium). Regular canned tomato sauce and paste (not low-sodium or reduced-sodium). Regular tomato and vegetable juice (not low-sodium or reduced-sodium). Angie Fava. Olives. Fruits Canned fruit in a light or heavy syrup. Fried fruit. Fruit in cream or butter sauce. Meat and other protein foods Fatty cuts of meat. Ribs. Fried meat. Berniece Salines. Sausage. Bologna and other processed lunch meats. Salami. Fatback. Hotdogs. Bratwurst. Salted nuts and seeds. Canned beans with added salt. Canned or smoked fish. Whole eggs or egg yolks. Chicken or [place] with skin. Dairy Whole or 2% milk, cream, and half-and-half. Whole or full-fat cream cheese. Whole-fat or sweetened yogurt. Full-fat cheese. Nondairy creamers. Whipped toppings. Processed cheese and cheese spreads. Fats and oils Butter. Stick margarine. Lard. Shortening. Ghee. Bacon fat. Tropical oils, such as coconut, palm kernel, or palm oil. Seasoning and other foods Salted popcorn and pretzels. Onion salt, garlic salt, seasoned salt, table salt, and sea salt. Worcestershire sauce. Tartar sauce. Barbecue sauce. Teriyaki sauce. Soy sauce, including reduced-sodium. Steak sauce. Canned and packaged gravies. Fish sauce. Oyster sauce. Cocktail sauce. Horseradish that you find on the shelf. Ketchup. Mustard. Meat flavorings and tenderizers. Bouillon cubes. Hot sauce and Tabasco sauce. Premade or packaged marinades. Premade or packaged taco seasonings. Relishes. Regular salad dressings. Where to find more information:  National Heart, Lung, and [place]: https://wilson-eaton.com/  American Heart Association: www.heart.org Summary  The DASH eating plan is a healthy eating plan that has been shown to reduce high blood pressure (hypertension). It may also reduce your risk for type 2 diabetes, heart disease, and stroke.  With the DASH eating plan, you should limit salt (sodium) intake to 2,300 mg a day. If you have  hypertension, you may need to reduce your sodium intake to 1,500 mg a day.  When on the DASH eating plan, aim to eat more fresh fruits and vegetables, whole grains, lean proteins, low-fat dairy, and heart-healthy fats.  Work with your health care provider or diet and nutrition specialist (dietitian) to adjust your eating plan to your individual calorie needs. This information is not intended to replace advice given to you by your health care provider. Make sure you discuss any questions you have with your health care provider. Document Released: 12/07/2010 Document Revised: 11/30/2016 Document Reviewed: 12/12/2015 Elsevier Patient Education  2020 [place], [place] [facility]

## 2018-08-14 NOTE — Addendum Note (Signed)
Addended by: Elmer Picker on: 08/14/2018 08:16 AM   Modules accepted: Orders

## 2018-08-27 ENCOUNTER — Ambulatory Visit (HOSPITAL_COMMUNITY): Payer: 59 | Attending: Cardiovascular Disease

## 2018-08-27 ENCOUNTER — Other Ambulatory Visit: Payer: Self-pay

## 2018-08-27 DIAGNOSIS — I10 Essential (primary) hypertension: Secondary | ICD-10-CM | POA: Insufficient documentation

## 2018-08-27 DIAGNOSIS — R600 Localized edema: Secondary | ICD-10-CM

## 2018-08-28 ENCOUNTER — Encounter: Payer: Self-pay | Admitting: Internal Medicine

## 2018-08-28 DIAGNOSIS — I5032 Chronic diastolic (congestive) heart failure: Secondary | ICD-10-CM | POA: Insufficient documentation

## 2018-09-05 ENCOUNTER — Other Ambulatory Visit: Payer: Self-pay

## 2018-09-05 ENCOUNTER — Ambulatory Visit (INDEPENDENT_AMBULATORY_CARE_PROVIDER_SITE_OTHER): Payer: 59 | Admitting: Internal Medicine

## 2018-09-05 ENCOUNTER — Encounter: Payer: Self-pay | Admitting: Internal Medicine

## 2018-09-05 VITALS — BP 140/90 | HR 74 | Temp 97.5°F | Wt 283.4 lb

## 2018-09-05 DIAGNOSIS — R7302 Impaired glucose tolerance (oral): Secondary | ICD-10-CM

## 2018-09-05 DIAGNOSIS — I5032 Chronic diastolic (congestive) heart failure: Secondary | ICD-10-CM

## 2018-09-05 DIAGNOSIS — E785 Hyperlipidemia, unspecified: Secondary | ICD-10-CM

## 2018-09-05 DIAGNOSIS — I10 Essential (primary) hypertension: Secondary | ICD-10-CM | POA: Diagnosis not present

## 2018-09-05 MED ORDER — LOSARTAN POTASSIUM 100 MG PO TABS: 100.0000 mg | ORAL_TABLET | Freq: Every day | ORAL | 1 refills | Status: DC

## 2018-09-05 MED ORDER — FUROSEMIDE 20 MG PO TABS: 20.0000 mg | ORAL_TABLET | Freq: Every day | ORAL | 1 refills | Status: DC

## 2018-09-05 MED ORDER — METOPROLOL TARTRATE 25 MG PO TABS: 25.0000 mg | ORAL_TABLET | Freq: Two times a day (BID) | ORAL | 1 refills | Status: DC

## 2018-09-05 NOTE — Progress Notes (Signed)
Established Patient Office Visit     CC/Reason for Visit: Follow-up blood pressure, discuss heart failure  HPI: Jamie [name] is a 58 y.o. female who is coming in today for the above mentioned reasons. Past Medical History is significant for: Uncontrolled hypertension, new diagnosis of diastolic congestive heart failure, morbid obesity, hyperlipidemia and impaired glucose tolerance.  Since I last saw her she has made some strides towards a healthy lifestyle, she has lost a little over 10 pounds, she has been taking her Lasix and losartan regularly, she states she is adhering to a low-sodium diet.  Since I last saw her we ordered a 2D echo which showed an ejection fraction of 60 to 65% and impaired relaxation indicating diastolic dysfunction.  She brings her blood pressure log which I will insert below:         Past Medical/Surgical History: Past Medical History:  Diagnosis Date  . Allergy   . Arthritis    in back  . Chest pain    neg cath 2016 after false positive myoview  . Chest pain   . Depression   . GERD (gastroesophageal reflux disease)   . High blood pressure   . Hyperglycemia   . Hyperlipidemia   . Hypertension   . Joint pain   . Knee pain   . Low back pain   . Migraine   . Mitral valve prolapse   . Muscle cramps     Past Surgical History:  Procedure Laterality Date  . BACK SURGERY    . CESAREAN SECTION    . LEFT HEART CATHETERIZATION WITH CORONARY ANGIOGRAM N/A 07/08/2013   Procedure: LEFT HEART CATHETERIZATION WITH CORONARY ANGIOGRAM;  Surgeon: Josue Hector, MD;  Location: [facility] CATH LAB;  Service: Cardiovascular;  Laterality: N/A;  . TUBAL LIGATION      Social History:  reports that she quit smoking about 22 years ago. Her smoking use included cigarettes. She has never used smokeless tobacco. She reports current alcohol use. She reports that she does not use drugs.  Allergies: No Known Allergies  Family History:  Family History  Problem Relation  Age of Onset  . Hyperlipidemia Mother   . Hypertension Mother   . AAA (abdominal aortic aneurysm) Mother   . Stroke Father   . Hypertension Father   . Hyperlipidemia Brother        x2  . Hypertension Brother        x2  . Liver cancer Maternal Aunt   . Breast cancer Cousin   . Colon cancer Cousin   . Colon polyps Neg Hx   . Esophageal cancer Neg Hx   . Stomach cancer Neg Hx   . Rectal cancer Neg Hx      Current Outpatient Medications:  .  acetaminophen (TYLENOL) 500 MG tablet, Take 2 tablets (1,000 mg total) by mouth at bedtime., Disp: 30 tablet, Rfl: 0 .  diclofenac sodium (VOLTAREN) 1 % GEL, Apply 2 g topically 4 (four) times daily as needed (pain)., Disp: , Rfl:  .  furosemide (LASIX) 20 MG tablet, Take 1 tablet (20 mg total) by mouth daily., Disp: 90 tablet, Rfl: 1 .  gabapentin (NEURONTIN) 300 MG capsule, Take 300-600 mg by mouth at bedtime. , Disp: , Rfl:  .  Ibuprofen-Famotidine (DUEXIS) 800-26.6 MG TABS, Take 1 tablet by mouth daily., Disp: 90 tablet, Rfl:  .  losartan (COZAAR) 100 MG tablet, Take 1 tablet (100 mg total) by mouth daily., Disp: 90 tablet, Rfl:  1 .  magnesium 30 MG tablet, Take 30 mg by mouth 2 (two) times daily., Disp: , Rfl:  .  OVER THE COUNTER MEDICATION, Apply 1 application topically daily as needed (pain). Red Oil, Disp: , Rfl:  .  triamcinolone cream (KENALOG) 0.1 %, APPLY 1 APPLICATION TOPICALLY DAILY AS NEEDED., Disp: 45 g, Rfl: 0 .  valACYclovir (VALTREX) 500 MG tablet, Take 1 tablet (500 mg total) by mouth daily., Disp: , Rfl:  .  VITAMIN D PO, Take 1 tablet by mouth daily., Disp: , Rfl:  .  metoprolol tartrate (LOPRESSOR) 25 MG tablet, Take 1 tablet (25 mg total) by mouth 2 (two) times daily., Disp: 180 tablet, Rfl: 1  Review of Systems:  Constitutional: Denies fever, chills, diaphoresis, appetite change and fatigue.  HEENT: Denies photophobia, eye pain, redness, hearing loss, ear pain, congestion, sore throat, rhinorrhea, sneezing, mouth sores,  trouble swallowing, neck pain, neck stiffness and tinnitus.   Respiratory: Denies SOB, DOE, cough, chest tightness,  and wheezing.   Cardiovascular: Denies chest pain, palpitations and leg swelling.  Gastrointestinal: Denies nausea, vomiting, abdominal pain, diarrhea, constipation, blood in stool and abdominal distention.  Genitourinary: Denies dysuria, urgency, frequency, hematuria, flank pain and difficulty urinating.  Endocrine: Denies: hot or cold intolerance, sweats, changes in hair or nails, polyuria, polydipsia. Musculoskeletal: Denies myalgias, back pain, joint swelling, arthralgias and gait problem.  Skin: Denies pallor, rash and wound.  Neurological: Denies dizziness, seizures, syncope, weakness, light-headedness, numbness and headaches.  Hematological: Denies adenopathy. Easy bruising, personal or family bleeding history  Psychiatric/Behavioral: Denies suicidal ideation, mood changes, confusion, nervousness, sleep disturbance and agitation    Physical Exam: Vitals:   09/05/18 0740  BP: 140/90  Pulse: 74  Temp: (!) 97.5 F (36.4 C)  TempSrc: Temporal  SpO2: 97%  Weight: 283 lb 6.4 oz (128.5 kg)    Body mass index is 40.09 kg/m.   Constitutional: NAD, calm, comfortable Eyes: PERRL, lids and conjunctivae normal ENMT: Mucous membranes are moist.  Respiratory: clear to auscultation bilaterally, no wheezing, no crackles. Normal respiratory effort. No accessory muscle use.  Cardiovascular: Regular rate and rhythm, no murmurs / rubs / gallops. No extremity edema. 2+ pedal pulses. No carotid bruits.  Abdomen: Obese, no tenderness, no masses palpated. No hepatosplenomegaly. Bowel sounds positive.  Musculoskeletal: no clubbing / cyanosis. No joint deformity upper and lower extremities. Good ROM, no contractures. Normal muscle tone.  Neurologic: Grossly intact and nonfocal  Psychiatric: Normal judgment and insight. Alert and oriented x 3. Normal mood.    Impression and Plan:   Chronic diastolic CHF (congestive heart failure) (HCC)  -Compensated at present. -Echo with ejection fraction of 60 to 65% with impaired relaxation. -Continue Lasix 20, losartan 100. -Since blood pressure still not at goal, will add metoprolol 25 mg twice daily. -Continue low-sodium diet.  She will follow-up with [place] in 8 weeks.  Essential hypertension  -See above for details.  Morbid obesity ([place]) -Discussed healthy lifestyle, including increased physical activity and better food choices to promote weight loss. -She has lost 10 pounds since last visit 3 weeks ago.  IGT (impaired glucose tolerance) -Last A1c was 6 on 13 August, continue lifestyle modifications.  Hyperlipidemia, unspecified hyperlipidemia type -LDL 124 in August 2020, continue lifestyle modifications. -Will consider adding statin at next visit.    Patient Instructions  -Nice seeing you today!!  -Continue Lasix 20 mg daily and Losartan 100 mg daily.  -START metoprolol 25 mg twice daily.  -Continue efforts at weight loss.  -Continue  low salt diet.  -Schedule follow up in 8 weeks.   Heart Failure, Diagnosis  Heart failure means that your heart is not able to pump blood in the right way. This makes it hard for your body to work well. Heart failure is usually a long-term (chronic) condition. You must take good care of yourself and follow your treatment plan from your doctor. What are the causes? This condition may be caused by:  High blood pressure.  Build up of cholesterol and fat in the arteries.  Heart attack. This injures the heart muscle.  Heart valves that do not open and close properly.  Damage of the heart muscle. This is also called cardiomyopathy.  Lung disease.  Abnormal heart rhythms. What increases the risk? The risk of heart failure goes up as a person ages. This condition is also more likely to develop in people who:  Are overweight.  Are female.  Smoke or chew tobacco.  Abuse  alcohol or illegal drugs.  Have taken medicines that can damage the heart.  Have diabetes.  Have abnormal heart rhythms.  Have thyroid problems.  Have low blood counts (anemia). What are the signs or symptoms? Symptoms of this condition include:  Shortness of breath.  Coughing.  Swelling of the feet, ankles, legs, or belly.  Losing weight for no reason.  Trouble breathing.  Waking from sleep because of the need to sit up and get more air.  Rapid heartbeat.  Being very tired.  Feeling dizzy, or feeling like you may pass out (faint).  Having no desire to eat.  Feeling like you may vomit (nauseous).  Peeing (urinating) more at night.  Feeling confused. How is this treated?     This condition may be treated with:  Medicines. These can be given to treat blood pressure and to make the heart muscles stronger.  Changes in your daily life. These may include eating a healthy diet, staying at a healthy body weight, quitting tobacco and illegal drug use, or doing exercises.  Surgery. Surgery can be done to open blocked valves, or to put devices in the heart, such as pacemakers.  A donor heart (heart transplant). You will receive a healthy heart from a donor. Follow these instructions at home:  Treat other conditions as told by your doctor. These may include high blood pressure, diabetes, thyroid disease, or abnormal heart rhythms.  Learn as much as you can about heart failure.  Get support as you need it.  Keep all follow-up visits as told by your doctor. This is important. Summary  Heart failure means that your heart is not able to pump blood in the right way.  This condition is caused by high blood pressure, heart attack, or damage of the heart muscle.  Symptoms of this condition include shortness of breath and swelling of the feet, ankles, legs, or belly. You may also feel very tired or feel like you may vomit.  You may be treated with medicines, surgery,  or changes in your daily life.  Treat other health conditions as told by your doctor. This information is not intended to replace advice given to you by your health care provider. Make sure you discuss any questions you have with your health care provider. Document Released: 09/27/2007 Document Revised: 03/07/2018 Document Reviewed: 03/07/2018 Elsevier Patient Education  2020 [place], MD [facility]

## 2018-09-05 NOTE — Patient Instructions (Signed)
-Nice seeing you today!!  -Continue Lasix 20 mg daily and Losartan 100 mg daily.  -START metoprolol 25 mg twice daily.  -Continue efforts at weight loss.  -Continue low salt diet.  -Schedule follow up in 8 weeks.   Heart Failure, Diagnosis  Heart failure means that your heart is not able to pump blood in the right way. This makes it hard for your body to work well. Heart failure is usually a long-term (chronic) condition. You must take good care of yourself and follow your treatment plan from your doctor. What are the causes? This condition may be caused by:  High blood pressure.  Build up of cholesterol and fat in the arteries.  Heart attack. This injures the heart muscle.  Heart valves that do not open and close properly.  Damage of the heart muscle. This is also called cardiomyopathy.  Lung disease.  Abnormal heart rhythms. What increases the risk? The risk of heart failure goes up as a person ages. This condition is also more likely to develop in people who:  Are overweight.  Are female.  Smoke or chew tobacco.  Abuse alcohol or illegal drugs.  Have taken medicines that can damage the heart.  Have diabetes.  Have abnormal heart rhythms.  Have thyroid problems.  Have low blood counts (anemia). What are the signs or symptoms? Symptoms of this condition include:  Shortness of breath.  Coughing.  Swelling of the feet, ankles, legs, or belly.  Losing weight for no reason.  Trouble breathing.  Waking from sleep because of the need to sit up and get more air.  Rapid heartbeat.  Being very tired.  Feeling dizzy, or feeling like you may pass out (faint).  Having no desire to eat.  Feeling like you may vomit (nauseous).  Peeing (urinating) more at night.  Feeling confused. How is this treated?     This condition may be treated with:  Medicines. These can be given to treat blood pressure and to make the heart muscles stronger.  Changes in  your daily life. These may include eating a healthy diet, staying at a healthy body weight, quitting tobacco and illegal drug use, or doing exercises.  Surgery. Surgery can be done to open blocked valves, or to put devices in the heart, such as pacemakers.  A donor heart (heart transplant). You will receive a healthy heart from a donor. Follow these instructions at home:  Treat other conditions as told by your doctor. These may include high blood pressure, diabetes, thyroid disease, or abnormal heart rhythms.  Learn as much as you can about heart failure.  Get support as you need it.  Keep all follow-up visits as told by your doctor. This is important. Summary  Heart failure means that your heart is not able to pump blood in the right way.  This condition is caused by high blood pressure, heart attack, or damage of the heart muscle.  Symptoms of this condition include shortness of breath and swelling of the feet, ankles, legs, or belly. You may also feel very tired or feel like you may vomit.  You may be treated with medicines, surgery, or changes in your daily life.  Treat other health conditions as told by your doctor. This information is not intended to replace advice given to you by your health care provider. Make sure you discuss any questions you have with your health care provider. Document Released: 09/27/2007 Document Revised: 03/07/2018 Document Reviewed: 03/07/2018 Elsevier Patient Education  2020 Elsevier  Inc.  

## 2018-09-16 ENCOUNTER — Ambulatory Visit: Payer: 59 | Admitting: Internal Medicine

## 2018-10-02 ENCOUNTER — Other Ambulatory Visit: Payer: Self-pay | Admitting: Internal Medicine

## 2018-10-02 DIAGNOSIS — Z1231 Encounter for screening mammogram for malignant neoplasm of breast: Secondary | ICD-10-CM

## 2018-11-05 ENCOUNTER — Ambulatory Visit (INDEPENDENT_AMBULATORY_CARE_PROVIDER_SITE_OTHER): Payer: 59 | Admitting: Internal Medicine

## 2018-11-05 ENCOUNTER — Other Ambulatory Visit: Payer: Self-pay

## 2018-11-05 ENCOUNTER — Encounter: Payer: Self-pay | Admitting: Internal Medicine

## 2018-11-05 VITALS — BP 150/90 | HR 65 | Temp 97.0°F | Wt 281.3 lb

## 2018-11-05 DIAGNOSIS — Z23 Encounter for immunization: Secondary | ICD-10-CM | POA: Diagnosis not present

## 2018-11-05 DIAGNOSIS — I10 Essential (primary) hypertension: Secondary | ICD-10-CM | POA: Diagnosis not present

## 2018-11-05 DIAGNOSIS — E785 Hyperlipidemia, unspecified: Secondary | ICD-10-CM

## 2018-11-05 DIAGNOSIS — R7302 Impaired glucose tolerance (oral): Secondary | ICD-10-CM | POA: Diagnosis not present

## 2018-11-05 DIAGNOSIS — I5032 Chronic diastolic (congestive) heart failure: Secondary | ICD-10-CM | POA: Diagnosis not present

## 2018-11-05 MED ORDER — HYDRALAZINE HCL 50 MG PO TABS: 50.0000 mg | ORAL_TABLET | Freq: Three times a day (TID) | ORAL | 1 refills | Status: DC

## 2018-11-05 MED ORDER — ASPIRIN EC 81 MG PO TBEC: 81.0000 mg | DELAYED_RELEASE_TABLET | Freq: Every day | ORAL | Status: DC

## 2018-11-05 NOTE — Patient Instructions (Signed)
-  Nice seeing you today!!  -Start aspirin 81 mg a day.  -Start hydralazine 50 mg twice daily.  -Schedule follow up in 8 weeks.  -Flu vaccine today.  -Bring in blood pressure log to your next visit.

## 2018-11-05 NOTE — Progress Notes (Signed)
Established Patient Office Visit     CC/Reason for Visit: Follow-up blood pressure  HPI: Jamie [name] is a 58 y.o. female who is coming in today for the above mentioned reasons. Past Medical History is significant for: Uncontrolled hypertension, new diagnosis of diastolic congestive heart failure, morbid obesity, hyperlipidemia and impaired glucose tolerance.  Since I last saw her she has made some strides towards a healthy lifestyle, she has lost a little over 10 pounds, she has been taking her Lasix and losartan regularly, she states she is adhering to a low-sodium diet.  She does not bring her blood pressure log today but states her blood pressures at home have been in the 1 [id number] systolic and 123XX123 to 0000000 diastolic which is consistent with an in office blood pressure 150/90 today.  She states the swelling in her legs has improved.  She does not have chest pain or shortness of breath.   Past Medical/Surgical History: Past Medical History:  Diagnosis Date  . Allergy   . Arthritis    in back  . Chest pain    neg cath 2016 after false positive myoview  . Chest pain   . Depression   . GERD (gastroesophageal reflux disease)   . High blood pressure   . Hyperglycemia   . Hyperlipidemia   . Hypertension   . Joint pain   . Knee pain   . Low back pain   . Migraine   . Mitral valve prolapse   . Muscle cramps     Past Surgical History:  Procedure Laterality Date  . BACK SURGERY    . CESAREAN SECTION    . LEFT HEART CATHETERIZATION WITH CORONARY ANGIOGRAM N/A 07/08/2013   Procedure: LEFT HEART CATHETERIZATION WITH CORONARY ANGIOGRAM;  Surgeon: Josue Hector, MD;  Location: [facility] CATH LAB;  Service: Cardiovascular;  Laterality: N/A;  . TUBAL LIGATION      Social History:  reports that she quit smoking about 22 years ago. Her smoking use included cigarettes. She has never used smokeless tobacco. She reports current alcohol use. She reports that she does not use drugs.  Allergies:  No Known Allergies  Family History:  Family History  Problem Relation Age of Onset  . Hyperlipidemia Mother   . Hypertension Mother   . AAA (abdominal aortic aneurysm) Mother   . Stroke Father   . Hypertension Father   . Hyperlipidemia Brother        x2  . Hypertension Brother        x2  . Liver cancer Maternal Aunt   . Breast cancer Cousin   . Colon cancer Cousin   . Colon polyps Neg Hx   . Esophageal cancer Neg Hx   . Stomach cancer Neg Hx   . Rectal cancer Neg Hx      Current Outpatient Medications:  .  acetaminophen (TYLENOL) 500 MG tablet, Take 2 tablets (1,000 mg total) by mouth at bedtime., Disp: 30 tablet, Rfl: 0 .  diclofenac sodium (VOLTAREN) 1 % GEL, Apply 2 g topically 4 (four) times daily as needed (pain)., Disp: , Rfl:  .  furosemide (LASIX) 20 MG tablet, Take 1 tablet (20 mg total) by mouth daily., Disp: 90 tablet, Rfl: 1 .  gabapentin (NEURONTIN) 300 MG capsule, Take 300-600 mg by mouth at bedtime. , Disp: , Rfl:  .  Ibuprofen-Famotidine (DUEXIS) 800-26.6 MG TABS, Take 1 tablet by mouth daily., Disp: 90 tablet, Rfl:  .  losartan (COZAAR) 100 MG  tablet, Take 1 tablet (100 mg total) by mouth daily., Disp: 90 tablet, Rfl: 1 .  magnesium 30 MG tablet, Take 30 mg by mouth 2 (two) times daily., Disp: , Rfl:  .  metoprolol tartrate (LOPRESSOR) 25 MG tablet, Take 1 tablet (25 mg total) by mouth 2 (two) times daily., Disp: 180 tablet, Rfl: 1 .  OVER THE COUNTER MEDICATION, Apply 1 application topically daily as needed (pain). Red Oil, Disp: , Rfl:  .  triamcinolone cream (KENALOG) 0.1 %, APPLY 1 APPLICATION TOPICALLY DAILY AS NEEDED., Disp: 45 g, Rfl: 0 .  valACYclovir (VALTREX) 500 MG tablet, Take 1 tablet (500 mg total) by mouth daily., Disp: , Rfl:  .  VITAMIN D PO, Take 1 tablet by mouth daily., Disp: , Rfl:  .  aspirin EC 81 MG tablet, Take 1 tablet (81 mg total) by mouth daily., Disp:  , Rfl:  .  hydrALAZINE (APRESOLINE) 50 MG tablet, Take 1 tablet (50 mg total) by  mouth 3 (three) times daily., Disp: 180 tablet, Rfl: 1  Review of Systems:  Constitutional: Denies fever, chills, diaphoresis, appetite change and fatigue.  HEENT: Denies photophobia, eye pain, redness, hearing loss, ear pain, congestion, sore throat, rhinorrhea, sneezing, mouth sores, trouble swallowing, neck pain, neck stiffness and tinnitus.   Respiratory: Denies SOB, DOE, cough, chest tightness,  and wheezing.   Cardiovascular: Denies chest pain, palpitations and leg swelling.  Gastrointestinal: Denies nausea, vomiting, abdominal pain, diarrhea, constipation, blood in stool and abdominal distention.  Genitourinary: Denies dysuria, urgency, frequency, hematuria, flank pain and difficulty urinating.  Endocrine: Denies: hot or cold intolerance, sweats, changes in hair or nails, polyuria, polydipsia. Musculoskeletal: Denies myalgias, back pain, joint swelling, arthralgias and gait problem.  Skin: Denies pallor, rash and wound.  Neurological: Denies dizziness, seizures, syncope, weakness, light-headedness, numbness and headaches.  Hematological: Denies adenopathy. Easy bruising, personal or family bleeding history  Psychiatric/Behavioral: Denies suicidal ideation, mood changes, confusion, nervousness, sleep disturbance and agitation    Physical Exam: Vitals:   11/05/18 0737  BP: (!) 150/90  Pulse: 65  Temp: (!) 97 F (36.1 C)  TempSrc: Temporal  SpO2: 97%  Weight: 281 lb 4.8 oz (127.6 kg)    Body mass index is 39.79 kg/m.   Constitutional: NAD, calm, comfortable Eyes: PERRL, lids and conjunctivae normal ENMT: Mucous membranes are moist.  Respiratory: clear to auscultation bilaterally, no wheezing, no crackles. Normal respiratory effort. No accessory muscle use.  Cardiovascular: Regular rate and rhythm, no murmurs / rubs / gallops.  Trace bilateral edema. 2+ pedal pulses.  Abdomen: no tenderness, no masses palpated. No hepatosplenomegaly. Bowel sounds positive.  Musculoskeletal:  no clubbing / cyanosis. No joint deformity upper and lower extremities. Good ROM, no contractures. Normal muscle tone.  Skin: no rashes, lesions, ulcers. No induration Neurologic: Grossly intact and nonfocal Psychiatric: Normal judgment and insight. Alert and oriented x 3. Normal mood.    Impression and Plan:  Chronic diastolic CHF (congestive heart failure) (HCC)  -Currently compensated. -Echo with ejection fraction of 60 to 65% with impaired relaxation. -She is on Lasix 20, losartan 100, metoprolol 25 mg twice daily. -As blood pressure is still not controlled, will add hydralazine 50 mg twice daily (with heart rate of 65 hesitant to further increase beta-blocker dose). -Start aspirin 81 mg daily. -Can consider starting statin at next visit but will not do today in order to avoid making too many changes in 1 visit.  Essential hypertension  -Not controlled, see above.  Hyperlipidemia, unspecified hyperlipidemia type -  Last LDL was 124 in August 2020. -Consider initiating statin at next visit.  IGT (impaired glucose tolerance) -Last A1c was 6.0 in August, she continues to make strides towards a healthy lifestyle.  Morbid obesity --Discussed healthy lifestyle, including increased physical activity and better food choices to promote weight loss.     Patient Instructions  -Nice seeing you today!!  -Start aspirin 81 mg a day.  -Start hydralazine 50 mg twice daily.  -Schedule follow up in 8 weeks.  -Flu vaccine today.  -Bring in blood pressure log to your next visit.     Lelon Frohlich, MD [facility]

## 2018-11-19 ENCOUNTER — Other Ambulatory Visit: Payer: Self-pay

## 2018-11-19 ENCOUNTER — Ambulatory Visit
Admission: RE | Admit: 2018-11-19 | Discharge: 2018-11-19 | Disposition: A | Discharge status: Home / Self Care | Payer: 59 | Source: Ambulatory Visit | Attending: Internal Medicine | Admitting: Internal Medicine

## 2018-11-19 DIAGNOSIS — Z1231 Encounter for screening mammogram for malignant neoplasm of breast: Secondary | ICD-10-CM

## 2018-11-19 LAB — HM MAMMOGRAPHY

## 2018-11-21 ENCOUNTER — Other Ambulatory Visit: Payer: Self-pay | Admitting: Internal Medicine

## 2018-11-21 DIAGNOSIS — R928 Other abnormal and inconclusive findings on diagnostic imaging of breast: Secondary | ICD-10-CM

## 2018-12-02 ENCOUNTER — Ambulatory Visit
Admission: RE | Admit: 2018-12-02 | Discharge: 2018-12-02 | Disposition: A | Discharge status: Home / Self Care | Payer: 59 | Source: Ambulatory Visit | Attending: Internal Medicine | Admitting: Internal Medicine

## 2018-12-02 ENCOUNTER — Other Ambulatory Visit: Payer: Self-pay

## 2018-12-02 DIAGNOSIS — R928 Other abnormal and inconclusive findings on diagnostic imaging of breast: Secondary | ICD-10-CM

## 2018-12-30 ENCOUNTER — Ambulatory Visit (INDEPENDENT_AMBULATORY_CARE_PROVIDER_SITE_OTHER): Payer: 59 | Admitting: Internal Medicine

## 2018-12-30 ENCOUNTER — Encounter: Payer: Self-pay | Admitting: Internal Medicine

## 2018-12-30 ENCOUNTER — Other Ambulatory Visit: Payer: Self-pay

## 2018-12-30 VITALS — BP 130/90 | HR 71 | Temp 97.2°F | Wt 278.2 lb

## 2018-12-30 DIAGNOSIS — R079 Chest pain, unspecified: Secondary | ICD-10-CM | POA: Diagnosis not present

## 2018-12-30 DIAGNOSIS — I5032 Chronic diastolic (congestive) heart failure: Secondary | ICD-10-CM | POA: Diagnosis not present

## 2018-12-30 DIAGNOSIS — R7302 Impaired glucose tolerance (oral): Secondary | ICD-10-CM

## 2018-12-30 DIAGNOSIS — Z6839 Body mass index (BMI) 39.0-39.9, adult: Secondary | ICD-10-CM

## 2018-12-30 DIAGNOSIS — I10 Essential (primary) hypertension: Secondary | ICD-10-CM | POA: Diagnosis not present

## 2018-12-30 DIAGNOSIS — E785 Hyperlipidemia, unspecified: Secondary | ICD-10-CM

## 2018-12-30 DIAGNOSIS — R0602 Shortness of breath: Secondary | ICD-10-CM

## 2018-12-30 MED ORDER — ATORVASTATIN CALCIUM 80 MG PO TABS: 80.0000 mg | ORAL_TABLET | Freq: Every day | ORAL | 1 refills | Status: DC

## 2018-12-30 MED ORDER — HYDRALAZINE HCL 50 MG PO TABS: 50.0000 mg | ORAL_TABLET | Freq: Three times a day (TID) | ORAL | 1 refills | Status: DC

## 2018-12-30 NOTE — Progress Notes (Signed)
Established Patient Office Visit     This visit occurred during the SARS-CoV-2 public health emergency.  Safety protocols were in place, including screening questions prior to the visit, additional usage of staff PPE, and extensive cleaning of exam room while observing appropriate contact time as indicated for disinfecting solutions.    CC/Reason for Visit: Blood pressure follow-up  HPI: Jamie [name] is a 58 y.o. female who is coming in today for the above mentioned reasons. Past Medical History is significant for: Uncontrolled hypertension, new diagnosis of diastolic congestive heart failure, morbid obesity, hyperlipidemia and impaired glucose tolerance.  Since I last saw her on November 4 she has lost an additional 2 pounds for total of 12.  At last visit hydralazine 50 mg twice daily was added to Lasix 20, losartan 100 and metoprolol 25 mg twice daily as her blood pressure remains uncontrolled.  She states she has been adhering to a low-salt diet.  She brings in her blood pressure log but essentially her systolics remain in the 0000000 to [id number] with diastolics averaging in the 90s.  In addition she has been complaining of some mild left-sided chest discomfort and dyspnea on exertion.  This is intermittent, has been going on for about 6 weeks.   Past Medical/Surgical History: Past Medical History:  Diagnosis Date  . Allergy   . Arthritis    in back  . Chest pain    neg cath 2016 after false positive myoview  . Chest pain   . Depression   . GERD (gastroesophageal reflux disease)   . High blood pressure   . Hyperglycemia   . Hyperlipidemia   . Hypertension   . Joint pain   . Knee pain   . Low back pain   . Migraine   . Mitral valve prolapse   . Muscle cramps     Past Surgical History:  Procedure Laterality Date  . BACK SURGERY    . CESAREAN SECTION    . LEFT HEART CATHETERIZATION WITH CORONARY ANGIOGRAM N/A 07/08/2013   Procedure: LEFT HEART CATHETERIZATION WITH  CORONARY ANGIOGRAM;  Surgeon: Josue Hector, MD;  Location: [facility] CATH LAB;  Service: Cardiovascular;  Laterality: N/A;  . TUBAL LIGATION      Social History:  reports that she quit smoking about 22 years ago. Her smoking use included cigarettes. She has never used smokeless tobacco. She reports current alcohol use. She reports that she does not use drugs.  Allergies: No Known Allergies  Family History:  Family History  Problem Relation Age of Onset  . Hyperlipidemia Mother   . Hypertension Mother   . AAA (abdominal aortic aneurysm) Mother   . Stroke Father   . Hypertension Father   . Hyperlipidemia Brother        x2  . Hypertension Brother        x2  . Liver cancer Maternal Aunt   . Breast cancer Cousin   . Colon cancer Cousin   . Colon polyps Neg Hx   . Esophageal cancer Neg Hx   . Stomach cancer Neg Hx   . Rectal cancer Neg Hx      Current Outpatient Medications:  .  acetaminophen (TYLENOL) 500 MG tablet, Take 2 tablets (1,000 mg total) by mouth at bedtime., Disp: 30 tablet, Rfl: 0 .  aspirin EC 81 MG tablet, Take 1 tablet (81 mg total) by mouth daily., Disp:  , Rfl:  .  diclofenac sodium (VOLTAREN) 1 % GEL, Apply 2  g topically 4 (four) times daily as needed (pain)., Disp: , Rfl:  .  furosemide (LASIX) 20 MG tablet, Take 1 tablet (20 mg total) by mouth daily., Disp: 90 tablet, Rfl: 1 .  gabapentin (NEURONTIN) 300 MG capsule, Take 300-600 mg by mouth at bedtime. , Disp: , Rfl:  .  hydrALAZINE (APRESOLINE) 50 MG tablet, Take 1 tablet (50 mg total) by mouth 3 (three) times daily., Disp: 180 tablet, Rfl: 1 .  Ibuprofen-Famotidine (DUEXIS) 800-26.6 MG TABS, Take 1 tablet by mouth daily., Disp: 90 tablet, Rfl:  .  losartan (COZAAR) 100 MG tablet, Take 1 tablet (100 mg total) by mouth daily., Disp: 90 tablet, Rfl: 1 .  magnesium 30 MG tablet, Take 30 mg by mouth 2 (two) times daily., Disp: , Rfl:  .  metoprolol tartrate (LOPRESSOR) 25 MG tablet, Take 1 tablet (25 mg total) by  mouth 2 (two) times daily., Disp: 180 tablet, Rfl: 1 .  OVER THE COUNTER MEDICATION, Apply 1 application topically daily as needed (pain). Red Oil, Disp: , Rfl:  .  triamcinolone cream (KENALOG) 0.1 %, APPLY 1 APPLICATION TOPICALLY DAILY AS NEEDED., Disp: 45 g, Rfl: 0 .  valACYclovir (VALTREX) 500 MG tablet, Take 1 tablet (500 mg total) by mouth daily., Disp: , Rfl:  .  VITAMIN D PO, Take 1 tablet by mouth daily., Disp: , Rfl:   Review of Systems:  Constitutional: Denies fever, chills, diaphoresis, appetite change and fatigue.  HEENT: Denies photophobia, eye pain, redness, hearing loss, ear pain, congestion, sore throat, rhinorrhea, sneezing, mouth sores, trouble swallowing, neck pain, neck stiffness and tinnitus.   Respiratory: Denies cough, and wheezing.   Cardiovascular: Denies palpitations. Gastrointestinal: Denies nausea, vomiting, abdominal pain, diarrhea, constipation, blood in stool and abdominal distention.  Genitourinary: Denies dysuria, urgency, frequency, hematuria, flank pain and difficulty urinating.  Endocrine: Denies: hot or cold intolerance, sweats, changes in hair or nails, polyuria, polydipsia. Musculoskeletal: Denies myalgias, back pain, joint swelling, arthralgias and gait problem.  Skin: Denies pallor, rash and wound.  Neurological: Denies dizziness, seizures, syncope, weakness, light-headedness, numbness and headaches.  Hematological: Denies adenopathy. Easy bruising, personal or family bleeding history  Psychiatric/Behavioral: Denies suicidal ideation, mood changes, confusion, nervousness, sleep disturbance and agitation    Physical Exam: Vitals:   12/30/18 1133  BP: 130/90  Pulse: 71  Temp: (!) 97.2 F (36.2 C)  TempSrc: Temporal  SpO2: 98%  Weight: 278 lb 3.2 oz (126.2 kg)    Body mass index is 39.35 kg/m.   Constitutional: NAD, calm, comfortable Eyes: PERRL, lids and conjunctivae normal ENMT: Mucous membranes are moist.  Respiratory: clear to  auscultation bilaterally, no wheezing, no crackles. Normal respiratory effort. No accessory muscle use.  Cardiovascular: Regular rate and rhythm, no murmurs / rubs / gallops.  1+ pitting edema bilaterally. 2+ pedal pulses.  Abdomen: no tenderness, no masses palpated. No hepatosplenomegaly. Bowel sounds positive.  Musculoskeletal: no clubbing / cyanosis. No joint deformity upper and lower extremities. Good ROM, no contractures. Normal muscle tone.  Skin: no rashes, lesions, ulcers. No induration Neurologic: Grossly intact and nonfocal Psychiatric: Normal judgment and insight. Alert and oriented x 3. Normal mood.    Impression and Plan:  Chest pain, unspecified type Shortness of breath on exertion  -EKG done today in office and interpreted by myself as normal sinus rhythm at a rate of 70 with normal axis and no acute ST or T wave changes. -She does have a multitude of coronary artery disease risk factors including obesity, hypertension, type  2 diabetes and hyperlipidemia as well as tobacco abuse. -Will place urgent cardiology referral as I do believe she needs to be studied.  Essential hypertension  -Remains uncontrolled. -Will increase hydralazine from 50 mg twice daily to 3 times daily in addition to losartan 100, Lasix 20 and metoprolol 25 mg twice daily.  Chronic diastolic CHF (congestive heart failure) ([place]) -Patient is compensated, on aspirin, beta-blocker, ARB.  IGT (impaired glucose tolerance) -A1c of 6.0 in August.  Hyperlipidemia, unspecified hyperlipidemia type -LDL above goal at 124 in August. -Start Lipitor 80 mg.  Class 2 severe obesity due to excess calories with serious comorbidity and body mass index (BMI) of 39.0 to 39.9 in adult [facility]) -Discussed healthy lifestyle, including increased physical activity and better food choices to promote weight loss.    Patient Instructions  -Nice seeing you today!!  -Increase hydralazine to 3 times a day instead of twice a  day.     Lelon Frohlich, MD [facility]

## 2018-12-30 NOTE — Patient Instructions (Addendum)
-  Nice seeing you today!!  -Increase hydralazine to 3 times a day instead of twice a day.  -Start lipitor 80 mg daily for high cholesterol.  -we are arranging for you to see cardiology. If worsening chest pain or shortness of breath, please go to ED.

## 2019-01-02 LAB — HM MAMMOGRAPHY: HM Mammogram: NORMAL (ref 0–4)

## 2019-01-21 NOTE — Progress Notes (Signed)
Cardiology Office Consult Note    Date:  01/22/2019   ID:  Jamie [name], DOB 1960-05-15, MRN [id number]  PCP:  Jamie [name], Rayford Halsted, MD  Cardiologist:  Jamie Him, MD   Chief Complaint  Patient presents with  . New Patient (Initial Visit)    Chest pain and SOB    History of Present Illness:  Jamie [name] is a 59 y.o. female who is being seen today for the evaluation of chest pain and SOB at the request of Jamie [name], Jamie [name]*.  This is a 59yo female with a hx of chest pain in the past with false positive myoview and normal coronary arteries by cath 2016, GERD, HTN, HLD and MVP.  Recently she has had problems with chest pain and SOB. She recently saw her PCP for BP check and mentioned that she had had some episodes of left sided CP associated with DOE which has been going on for about 2 months.  She had a 2D echo in Aug 2020 showing normal LVF and G1DD.  She tells me that her chest discomfort is sometimes sharp and stabbing on the left side and is improved with belching but at other times it is a pressure that radiates into her left arm with arm tingling and SOB.  No Nausea or diaphoresis.  The discomfort is nonexertional.  She will get SOB with this sometimes.  She also has noticed DOE but is not sure that it may be from having to wear a mask.  She also has noticed a fluttering sensation in her chest at times that usually occurs 1 time/week with no associated sx.  She has a remote hx of tobacco use and quit 22  Years ago. There is no fm hx of CAD.  Past Medical History:  Diagnosis Date  . Allergy   . Arthritis    in back  . Chest pain    neg cath 2016 after false positive myoview  . Chest pain   . Depression   . GERD (gastroesophageal reflux disease)   . High blood pressure   . Hyperglycemia   . Hyperlipidemia   . Hypertension   . Joint pain   . Knee pain   . Low back pain   . Migraine   . Mitral valve prolapse   . Muscle cramps     Past Surgical  History:  Procedure Laterality Date  . BACK SURGERY    . CESAREAN SECTION    . LEFT HEART CATHETERIZATION WITH CORONARY ANGIOGRAM N/A 07/08/2013   Procedure: LEFT HEART CATHETERIZATION WITH CORONARY ANGIOGRAM;  Surgeon: Jamie Hector, MD;  Location: [facility] CATH LAB;  Service: Cardiovascular;  Laterality: N/A;  . TUBAL LIGATION      Current Medications: Current Meds  Medication Sig  . acetaminophen (TYLENOL) 500 MG tablet Take 2 tablets (1,000 mg total) by mouth at bedtime.  Marland Kitchen aspirin EC 81 MG tablet Take 1 tablet (81 mg total) by mouth daily.  Marland Kitchen atorvastatin (LIPITOR) 80 MG tablet Take 1 tablet (80 mg total) by mouth daily.  . diclofenac sodium (VOLTAREN) 1 % GEL Apply 2 g topically 4 (four) times daily as needed (pain).  . furosemide (LASIX) 20 MG tablet Take 1 tablet (20 mg total) by mouth daily.  Marland Kitchen gabapentin (NEURONTIN) 300 MG capsule Take 300-600 mg by mouth at bedtime.   . hydrALAZINE (APRESOLINE) 50 MG tablet Take 1 tablet (50 mg total) by mouth 3 (three) times daily.  . Ibuprofen-Famotidine 800-26.6 MG  TABS Take 1 tablet by mouth as directed.  Marland Kitchen losartan (COZAAR) 100 MG tablet Take 1 tablet (100 mg total) by mouth daily.  . magnesium 30 MG tablet Take 30 mg by mouth 2 (two) times daily.  . metoprolol tartrate (LOPRESSOR) 25 MG tablet Take 1 tablet (25 mg total) by mouth 2 (two) times daily.  Marland Kitchen OVER THE COUNTER MEDICATION Apply 1 application topically daily as needed (pain). Red Oil  . triamcinolone cream (KENALOG) 0.1 % APPLY 1 APPLICATION TOPICALLY DAILY AS NEEDED.  Marland Kitchen valACYclovir (VALTREX) 500 MG tablet Take 1 tablet (500 mg total) by mouth daily.  Marland Kitchen VITAMIN D PO Take 1 tablet by mouth daily.    Allergies:   Patient has no known allergies.   Social History   Socioeconomic History  . Marital status: Married    Spouse name: Jamie [name]  . Number of children: 1  . Years of education: Not on file  . Highest education level: Not on file  Occupational History    Employer: [employer]  Tobacco Use  . Smoking status: Former Smoker    Types: Cigarettes    Quit date: 07/31/1996    Years since quitting: 22.4  . Smokeless tobacco: Never Used  Substance and Sexual Activity  . Alcohol use: Yes    Comment: very little  . Drug use: No  . Sexual activity: Not on file  Other Topics Concern  . Not on file  Social History Narrative  . Not on file   Social Determinants of Health   Financial Resource Strain:   . Difficulty of Paying Living Expenses: Not on file  Food Insecurity:   . Worried About Charity fundraiser in the Last Year: Not on file  . Ran Out of Food in the Last Year: Not on file  Transportation Needs:   . Lack of Transportation (Medical): Not on file  . Lack of Transportation (Non-Medical): Not on file  Physical Activity:   . Days of Exercise per Week: Not on file  . Minutes of Exercise per Session: Not on file  Stress:   . Feeling of Stress : Not on file  Social Connections:   . Frequency of Communication with Friends and Family: Not on file  . Frequency of Social Gatherings with Friends and Family: Not on file  . Attends Religious Services: Not on file  . Active Member of Clubs or Organizations: Not on file  . Attends Archivist Meetings: Not on file  . Marital Status: Not on file     Family History:  The patient's family history includes AAA (abdominal aortic aneurysm) in her mother; Breast cancer in her cousin; Colon cancer in her cousin; Hyperlipidemia in her brother and mother; Hypertension in her brother, father, and mother; Liver cancer in her maternal aunt; Stroke in her father.   ROS:   Please see the history of present illness.    ROS All other systems reviewed and are negative.  No flowsheet data found.     PHYSICAL EXAM:   VS:  BP 134/86   Pulse 77   Ht 5' 10.5" (1.791 m)   Wt 274 lb 12.8 oz (124.6 kg)   LMP 10/04/2014   BMI 38.87 kg/m    GEN: Well nourished, well developed, in no acute distress    HEENT: normal  Neck: no JVD, carotid bruits, or masses Cardiac: RRR; no murmurs, rubs, or gallops,no edema.  Intact distal pulses bilaterally.  Respiratory:  clear to  auscultation bilaterally, normal work of breathing GI: soft, nontender, nondistended, + BS MS: no deformity or atrophy  Skin: warm and dry, no rash Neuro:  Alert and Oriented x 3, Strength and sensation are intact Psych: euthymic mood, full affect  Wt Readings from Last 3 Encounters:  01/22/19 274 lb 12.8 oz (124.6 kg)  12/30/18 278 lb 3.2 oz (126.2 kg)  11/05/18 281 lb 4.8 oz (127.6 kg)      Studies/Labs Reviewed:   EKG:  EKG is ordered today.  The ekg ordered today demonstrates NSR with no ST changes  Recent Labs: 08/14/2018: ALT 16; BUN 13; Creatinine, Ser 0.82; Hemoglobin 12.8; Platelets 229.0; Potassium 4.0; Sodium 142; TSH 2.22   Lipid Panel    Component Value Date/Time   CHOL 194 08/14/2018 0816   CHOL 193 10/01/2017 1150   TRIG 94.0 08/14/2018 0816   HDL 51.10 08/14/2018 0816   HDL 51 10/01/2017 1150   CHOLHDL 4 08/14/2018 0816   VLDL 18.8 08/14/2018 0816   LDLCALC 124 (H) 08/14/2018 0816   LDLCALC 121 (H) 10/01/2017 1150    Additional studies/ records that were reviewed today include:  EKG, Cardiac cath, labs    ASSESSMENT:    1. Chest pain of uncertain etiology   2. SOB (shortness of breath)   3. Chronic diastolic CHF (congestive heart failure) ([place])   4. Essential hypertension   5. Pure hypercholesterolemia   6. Palpitations   7. Precordial pain      PLAN:  In order of problems listed above:  1. Chest pain -she has atypical and typical components to her CP and some of her sx sound like GERD -she does have CRFs including obesity, HLD and HTN as well as remote tobacco use -EKG is nonischemic -recommend coronary CTA to assess for CAD -if CTA low risk then consider treatment for GERD  2.  SOB -may be related to obesity but need to rule out CAD -also has hx of MVP and diastolic  CHF -will get 2D echo to assess LVF and diastolic function  3.  Chronic diastolic CHF -appears euvolemic on exam -has chronic LE edema related to this -continue Lasix  4.  HTN -BP controlled -continue Hydralazine 50mg  TID, Losartan 100mg  daily and Lopressor 25mg  BID  5.  HLD -continue Lipitor 80mg  daily -followed by PCP  6.  Palpitations -unclear etiology -EKG normal -they only occur a few times a month so will get a 30 event monitor to assess for arrhythmias   Medication Adjustments/Labs and Tests Ordered: Current medicines are reviewed at length with the patient today.  Concerns regarding medicines are outlined above.  Medication changes, Labs and Tests ordered today are listed in the Patient Instructions below.  Patient Instructions  Medication Instructions:  Your physician recommends that you continue on your current medications as directed. Please refer to the Current Medication list given to you today.  *If you need a refill on your cardiac medications before your next appointment, please call your pharmacy*  Lab Work: BMET  prior to CT scan. If you have labs (blood work) drawn today and your tests are completely normal, you will receive your results only by: Marland Kitchen MyChart Message (if you have MyChart) OR . A paper copy in the mail If you have any lab test that is abnormal or we need to change your treatment, we will call you to review the results.  Testing/Procedures: Your physician has requested that you have an echocardiogram. Echocardiography is a painless test that uses  sound waves to create images of your heart. It provides your doctor with information about the size and shape of your heart and how well your heart's chambers and valves are working. This procedure takes approximately one hour. There are no restrictions for this procedure.  Your physician has requested that you have cardiac CT. Cardiac computed tomography (CT) is a painless test that uses an x-ray  machine to take clear, detailed pictures of your heart. For further information please visit [employer]uge[employer]iesta[employer]tn. Please follow instruction sheet as given.  Your physician has recommended that you wear an event monitor. Event monitors are medical devices that record the heart's electrical activity. Doctors most often [place] these monitors to diagnose arrhythmias. Arrhythmias are problems with the speed or rhythm of the heartbeat. The monitor is a small, portable device. You can wear one while you do your normal daily activities. This is usually used to diagnose what is causing palpitations/syncope (passing out).   Follow-Up: At [facility], you and your health needs are our priority.  As part of our continuing mission to provide you with exceptional heart care, we have created designated Provider Care Teams.  These Care Teams include your primary Cardiologist (physician) and Advanced Practice Providers (APPs -  Physician Assistants and Nurse Practitioners) who all work together to provide you with the care you need, when you need it.  Follow up with Dr. Radford Pax as needed based on results from testing.   Other Instructions  Your cardiac CT will be scheduled at one of the below locations:   [facility] [address] [place], [place] [address] [phone]  [place] [address] [place], [place] [address] [phone]  If scheduled at [facility], please arrive at the [facility] main entrance of [facility] 30-45 minutes prior to test start time. Proceed to the [facility] Radiology Department (first floor) to check-in and test prep.  If scheduled at [facility], please arrive 15 mins early for check-in and test prep.  Please follow these instructions carefully (unless otherwise directed):  Hold all erectile dysfunction medications at least 3 days (72 hrs) prior to test.  On  the Night Before the Test: . Be sure to Drink plenty of water. . Do not consume any caffeinated/decaffeinated beverages or chocolate 12 hours prior to your test. . Do not take any antihistamines 12 hours prior to your test.  On the Day of the Test: . Drink plenty of water. Do not drink any water within one hour of the test. . Do not eat any food 4 hours prior to the test. . You may take your regular medications prior to the test.  . Take metoprolol (Lopressor) two hours prior to test. . HOLD Furosemide morning of the test. . FEMALES- please wear underwire-free bra if availabl      After the Test: . Drink plenty of water. . After receiving IV contrast, you may experience a mild flushed feeling. This is normal. . On occasion, you may experience a mild rash up to 24 hours after the test. This is not dangerous. If this occurs, you can take Benadryl 25 mg and increase your fluid intake. . If you experience trouble breathing, this can be serious. If it is severe call 911 IMMEDIATELY. If it is mild, please call our office. . If you take any of these medications: Glipizide/Metformin, Avandament, Glucavance, please do not take 48 hours after completing test unless otherwise  instructed.   Once we have confirmed authorization from your insurance company, we will call you to set up a date and time for your test.   For non-scheduling related questions, please contact the cardiac imaging nurse navigator should you have any questions/concerns: Marchia Bond, RN Navigator Cardiac Imaging [facility] [phone] Office        Signed, Jamie Him, MD  01/22/2019 9:20 AM    [address] [place], [place], [place]  [address] Phone: [phone]; Fax: [phone]

## 2019-01-22 ENCOUNTER — Telehealth: Payer: Self-pay | Admitting: *Deleted

## 2019-01-22 ENCOUNTER — Ambulatory Visit (INDEPENDENT_AMBULATORY_CARE_PROVIDER_SITE_OTHER): Payer: 59 | Admitting: Cardiology

## 2019-01-22 ENCOUNTER — Other Ambulatory Visit: Payer: Self-pay

## 2019-01-22 VITALS — BP 134/86 | HR 77 | Ht 70.5 in | Wt 274.8 lb

## 2019-01-22 DIAGNOSIS — R079 Chest pain, unspecified: Secondary | ICD-10-CM | POA: Diagnosis not present

## 2019-01-22 DIAGNOSIS — R0602 Shortness of breath: Secondary | ICD-10-CM | POA: Diagnosis not present

## 2019-01-22 DIAGNOSIS — E7800 Pure hypercholesterolemia, unspecified: Secondary | ICD-10-CM

## 2019-01-22 DIAGNOSIS — R002 Palpitations: Secondary | ICD-10-CM

## 2019-01-22 DIAGNOSIS — R072 Precordial pain: Secondary | ICD-10-CM

## 2019-01-22 DIAGNOSIS — I5032 Chronic diastolic (congestive) heart failure: Secondary | ICD-10-CM

## 2019-01-22 DIAGNOSIS — I10 Essential (primary) hypertension: Secondary | ICD-10-CM

## 2019-01-22 MED ORDER — METOPROLOL TARTRATE 100 MG PO TABS: 100.0000 mg | ORAL_TABLET | Freq: Once | ORAL | 0 refills | Status: DC

## 2019-01-22 NOTE — Patient Instructions (Addendum)
Medication Instructions:  Your physician recommends that you continue on your current medications as directed. Please refer to the Current Medication list given to you today.  *If you need a refill on your cardiac medications before your next appointment, please call your pharmacy*  Lab Work: BMET  prior to CT scan. If you have labs (blood work) drawn today and your tests are completely normal, you will receive your results only by: Marland Kitchen MyChart Message (if you have MyChart) OR . A paper copy in the mail If you have any lab test that is abnormal or we need to change your treatment, we will call you to review the results.  Testing/Procedures: Your physician has requested that you have an echocardiogram. Echocardiography is a painless test that uses sound waves to create images of your heart. It provides your doctor with information about the size and shape of your heart and how well your heart's chambers and valves are working. This procedure takes approximately one hour. There are no restrictions for this procedure.  Your physician has requested that you have cardiac CT. Cardiac computed tomography (CT) is a painless test that uses an x-ray machine to take clear, detailed pictures of your heart. For further information please visit [email or web address]. Please follow instruction sheet as given.  Your physician has recommended that you wear an event monitor. Event monitors are medical devices that record the heart's electrical activity. Doctors most often [place] these monitors to diagnose arrhythmias. Arrhythmias are problems with the speed or rhythm of the heartbeat. The monitor is a small, portable device. You can wear one while you do your normal daily activities. This is usually used to diagnose what is causing palpitations/syncope (passing out).   Follow-Up: At [facility], you and your health needs are our priority.  As part of our continuing mission to provide you with exceptional heart care,  we have created designated Provider Care Teams.  These Care Teams include your primary Cardiologist (physician) and Advanced Practice Providers (APPs -  Physician Assistants and Nurse Practitioners) who all work together to provide you with the care you need, when you need it.  Follow up with Dr. Radford Pax as needed based on results from testing.   Other Instructions  Your cardiac CT will be scheduled at one of the below locations:   [facility] [address] [place], [place] [address] [phone]  [facility] [address] [place], [place] [address] [phone]  If scheduled at [facility], please arrive at the [facility] main entrance of [facility] 30-45 minutes prior to test start time. Proceed to the [facility] Radiology Department (first floor) to check-in and test prep.  If scheduled at [facility], please arrive 15 mins early for check-in and test prep.  Please follow these instructions carefully (unless otherwise directed):  Hold all erectile dysfunction medications at least 3 days (72 hrs) prior to test.  On the Night Before the Test: . Be sure to Drink plenty of water. . Do not consume any caffeinated/decaffeinated beverages or chocolate 12 hours prior to your test. . Do not take any antihistamines 12 hours prior to your test.  On the Day of the Test: . Drink plenty of water. Do not drink any water within one hour of the test. . Do not eat any food 4 hours prior to the test. . You may take your regular medications prior to the test.  . Take metoprolol (  Lopressor) two hours prior to test. . HOLD Furosemide morning of the test. . FEMALES- please wear underwire-free bra if availabl      After the Test: . Drink plenty of water. . After receiving IV contrast, you may experience a mild flushed feeling. This is normal. . On occasion, you may experience a mild  rash up to 24 hours after the test. This is not dangerous. If this occurs, you can take Benadryl 25 mg and increase your fluid intake. . If you experience trouble breathing, this can be serious. If it is severe call 911 IMMEDIATELY. If it is mild, please call our office. . If you take any of these medications: Glipizide/Metformin, Avandament, Glucavance, please do not take 48 hours after completing test unless otherwise instructed.   Once we have confirmed authorization from your insurance company, we will call you to set up a date and time for your test.   For non-scheduling related questions, please contact the cardiac imaging nurse navigator should you have any questions/concerns: Marchia Bond, RN Navigator Cardiac Imaging [facility] [phone] Office

## 2019-01-22 NOTE — Telephone Encounter (Signed)
Patient enrolled for [employer] to ship a 30 day cardiac event monitor to her home.

## 2019-01-23 ENCOUNTER — Ambulatory Visit: Payer: 59

## 2019-02-01 ENCOUNTER — Ambulatory Visit (INDEPENDENT_AMBULATORY_CARE_PROVIDER_SITE_OTHER): Payer: 59

## 2019-02-01 DIAGNOSIS — R072 Precordial pain: Secondary | ICD-10-CM

## 2019-02-01 DIAGNOSIS — R079 Chest pain, unspecified: Secondary | ICD-10-CM | POA: Diagnosis not present

## 2019-02-01 DIAGNOSIS — R002 Palpitations: Secondary | ICD-10-CM

## 2019-02-01 DIAGNOSIS — R0602 Shortness of breath: Secondary | ICD-10-CM | POA: Diagnosis not present

## 2019-02-05 ENCOUNTER — Telehealth: Payer: Self-pay | Admitting: Internal Medicine

## 2019-02-05 NOTE — Telephone Encounter (Signed)
Spoke with patient:  Your test for COVID-19 was positive ("detected"), meaning that you were infected with the novel coronavirus and could give the germ to others.   Please continue isolation at home, for at least 10 days since the start of your fever/cough/breathlessness and until you have had 24 hours without fever (without taking a fever reducer) and with any cough/breathlessness improving. Use over-the-counter medications for symptoms.  If you have had no symptoms, but were exposed to someone who was positive for COVID-19, you will need to quarantine and self-isolate for 14 days from the date of exposure.   Please continue good preventive care measures, including: frequent hand-washing, avoid touching your face, cover coughs/sneezes, stay out of crowds and keep a 6 foot distance from others. Clean hard surfaces touched frequently with disinfectant cleaning products.   Please check in with your primary care provider about your positive test result. Go to the nearest urgent care or ED for assessment if you have severe breathlessness or severe weakness/fatigue (ex needing new help getting out of bed or to the bathroom).  Members of your household will also need to quarantine for 14 days from the date of your positive test. You may be contacted to discuss possible treatment options, and you may also be contacted by the health department for follow up.

## 2019-02-05 NOTE — Telephone Encounter (Signed)
Patient called to let Dr. Jerilee Hoh know that she tested positive for COVID today and would like to know what you suggest that she needs to do.  Patient would like a call with advise at: [phone]

## 2019-02-09 ENCOUNTER — Other Ambulatory Visit (HOSPITAL_COMMUNITY): Payer: 59

## 2019-03-02 ENCOUNTER — Ambulatory Visit (HOSPITAL_BASED_OUTPATIENT_CLINIC_OR_DEPARTMENT_OTHER): Payer: 59

## 2019-03-02 ENCOUNTER — Encounter: Payer: Self-pay | Admitting: Cardiology

## 2019-03-02 ENCOUNTER — Other Ambulatory Visit: Payer: Self-pay

## 2019-03-02 ENCOUNTER — Encounter (HOSPITAL_COMMUNITY): Payer: Self-pay

## 2019-03-02 ENCOUNTER — Telehealth (HOSPITAL_COMMUNITY): Payer: Self-pay | Admitting: Emergency Medicine

## 2019-03-02 DIAGNOSIS — R072 Precordial pain: Secondary | ICD-10-CM | POA: Diagnosis not present

## 2019-03-02 DIAGNOSIS — R079 Chest pain, unspecified: Secondary | ICD-10-CM | POA: Diagnosis not present

## 2019-03-02 DIAGNOSIS — I272 Pulmonary hypertension, unspecified: Secondary | ICD-10-CM | POA: Insufficient documentation

## 2019-03-02 DIAGNOSIS — R0602 Shortness of breath: Secondary | ICD-10-CM | POA: Diagnosis not present

## 2019-03-02 DIAGNOSIS — I77819 Aortic ectasia, unspecified site: Secondary | ICD-10-CM | POA: Insufficient documentation

## 2019-03-02 NOTE — Telephone Encounter (Signed)
Reaching out to patient to offer assistance regarding upcoming cardiac imaging study; pt verbalizes understanding of appt date/time, parking situation and where to check in, pre-test NPO status and medications ordered, and verified current allergies; name and call back number provided for further questions should they arise Arlester Keehan RN Navigator Cardiac Imaging  Heart and Vascular [phone] office [phone] cell 

## 2019-03-03 ENCOUNTER — Encounter (HOSPITAL_COMMUNITY): Payer: Self-pay

## 2019-03-03 ENCOUNTER — Ambulatory Visit (HOSPITAL_COMMUNITY)
Admission: RE | Admit: 2019-03-03 | Discharge: 2019-03-03 | Disposition: A | Discharge status: Home / Self Care | Payer: 59 | Source: Ambulatory Visit | Attending: Cardiology | Admitting: Cardiology

## 2019-03-03 DIAGNOSIS — R079 Chest pain, unspecified: Secondary | ICD-10-CM | POA: Diagnosis not present

## 2019-03-03 DIAGNOSIS — R072 Precordial pain: Secondary | ICD-10-CM | POA: Insufficient documentation

## 2019-03-03 DIAGNOSIS — R0602 Shortness of breath: Secondary | ICD-10-CM | POA: Insufficient documentation

## 2019-03-03 MED ORDER — NITROGLYCERIN 0.4 MG SL SUBL: 0.8000 mg | SUBLINGUAL_TABLET | Freq: Once | SUBLINGUAL | Status: AC

## 2019-03-03 MED ORDER — IOHEXOL 350 MG/ML SOLN
80.0000 mL | Freq: Once | INTRAVENOUS | Status: AC | PRN
  Administered 2019-03-03: 80 mL via INTRAVENOUS

## 2019-03-03 MED ORDER — NITROGLYCERIN 0.4 MG SL SUBL
0.8000 mg | SUBLINGUAL_TABLET | Freq: Once | SUBLINGUAL | Status: AC
  Administered 2019-03-03: 0.8 mg via SUBLINGUAL

## 2019-03-03 MED ORDER — NITROGLYCERIN 0.4 MG SL SUBL
SUBLINGUAL_TABLET | SUBLINGUAL | Status: AC
  Administered 2019-03-03: 0.8 mg via SUBLINGUAL
  Filled 2019-03-03: qty 2

## 2019-03-03 MED ORDER — NITROGLYCERIN 0.4 MG SL SUBL
SUBLINGUAL_TABLET | SUBLINGUAL | Status: AC
  Filled 2019-03-03: qty 2

## 2019-03-04 ENCOUNTER — Telehealth: Payer: Self-pay

## 2019-03-04 DIAGNOSIS — I5032 Chronic diastolic (congestive) heart failure: Secondary | ICD-10-CM

## 2019-03-04 DIAGNOSIS — I272 Pulmonary hypertension, unspecified: Secondary | ICD-10-CM

## 2019-03-04 MED ORDER — PANTOPRAZOLE SODIUM 40 MG PO TBEC: 40.0000 mg | DELAYED_RELEASE_TABLET | Freq: Every day | ORAL | 11 refills | Status: DC

## 2019-03-04 NOTE — Telephone Encounter (Signed)
-----   Message from Sueanne Margarita, MD sent at 03/03/2019  6:59 PM EST ----- Coronary CTA showed no coronary calcium.  No obvious CAD but study limited by obesity.  Please start her on Protonix 40mg  daily and followup with me virtual in 4 weeks

## 2019-03-04 NOTE — Telephone Encounter (Signed)
-----   Message from Sueanne Margarita, MD sent at 03/02/2019  5:24 PM EST ----- Echo showed normal LVF with mildly thickened heart muscle and increased stiffness of heart muscle, mildly elevated BP in the arteries of the lungs, mildly enlarged LA, mildly dilated ascending aorta - please have her come in for BNP and repeat echo in 1 year for pulmonary HTN and dilated aorta

## 2019-03-04 NOTE — Telephone Encounter (Signed)
Consent for Virtual Visit with [facility]       [name] has provided verbal consent on 03/04/2019 for a virtual visit (video or telephone).   CONSENT FOR VIRTUAL VISIT FOR:  Jamie [name]  By participating in this virtual visit I agree to the following:  I hereby voluntarily request, consent and authorize [facility] and its employed or contracted physicians, physician assistants, nurse practitioners or other licensed health care professionals (the Practitioner), to provide me with telemedicine health care services (the "Services") as deemed necessary by the treating Practitioner. I acknowledge and consent to receive the Services by the Practitioner via telemedicine. I understand that the telemedicine visit will involve communicating with the Practitioner through live audiovisual communication technology and the disclosure of certain medical information by electronic transmission. I acknowledge that I have been given the opportunity to request an in-person assessment or other available alternative prior to the telemedicine visit and am voluntarily participating in the telemedicine visit.  I understand that I have the right to withhold or withdraw my consent to the use of telemedicine in the course of my care at any time, without affecting my right to future care or treatment, and that the Practitioner or I may terminate the telemedicine visit at any time. I understand that I have the right to inspect all information obtained and/or recorded in the course of the telemedicine visit and may receive copies of available information for a reasonable fee.  I understand that some of the potential risks of receiving the Services via telemedicine include:  Marland Kitchen Delay or interruption in medical evaluation due to technological equipment failure or disruption; . Information transmitted may not be sufficient (e.g. poor resolution of images) to allow for appropriate medical decision making by the  Practitioner; and/or  . In rare instances, security protocols could fail, causing a breach of personal health information.  Furthermore, I acknowledge that it is my responsibility to provide information about my medical history, conditions and care that is complete and accurate to the best of my ability. I acknowledge that Practitioner's advice, recommendations, and/or decision may be based on factors not within their control, such as incomplete or inaccurate data provided by me or distortions of diagnostic images or specimens that may result from electronic transmissions. I understand that the practice of medicine is not an exact science and that Practitioner makes no warranties or guarantees regarding treatment outcomes. I acknowledge that a copy of this consent can be made available to me via my patient portal ([place]), or I can request a printed copy by calling the office of [facility].    I understand that my insurance will be billed for this visit.   I have read or had this consent read to me. . I understand the contents of this consent, which adequately explains the benefits and risks of the Services being provided via telemedicine.  . I have been provided ample opportunity to ask questions regarding this consent and the Services and have had my questions answered to my satisfaction. . I give my informed consent for the services to be provided through the use of telemedicine in my medical care

## 2019-03-05 ENCOUNTER — Other Ambulatory Visit: Payer: Self-pay | Admitting: Cardiology

## 2019-03-05 DIAGNOSIS — R072 Precordial pain: Secondary | ICD-10-CM

## 2019-03-05 DIAGNOSIS — R0602 Shortness of breath: Secondary | ICD-10-CM

## 2019-03-05 DIAGNOSIS — R002 Palpitations: Secondary | ICD-10-CM

## 2019-03-05 DIAGNOSIS — R079 Chest pain, unspecified: Secondary | ICD-10-CM

## 2019-03-10 ENCOUNTER — Other Ambulatory Visit: Payer: 59

## 2019-03-10 ENCOUNTER — Other Ambulatory Visit: Payer: Self-pay

## 2019-03-10 ENCOUNTER — Other Ambulatory Visit: Payer: Self-pay | Admitting: Internal Medicine

## 2019-03-10 DIAGNOSIS — I10 Essential (primary) hypertension: Secondary | ICD-10-CM

## 2019-03-10 DIAGNOSIS — I5032 Chronic diastolic (congestive) heart failure: Secondary | ICD-10-CM

## 2019-03-11 LAB — PRO B NATRIURETIC PEPTIDE: NT-Pro BNP: 130 pg/mL (ref 0–287)

## 2019-03-24 ENCOUNTER — Other Ambulatory Visit: Payer: Self-pay

## 2019-03-25 ENCOUNTER — Ambulatory Visit (INDEPENDENT_AMBULATORY_CARE_PROVIDER_SITE_OTHER): Payer: 59 | Admitting: Internal Medicine

## 2019-03-25 ENCOUNTER — Encounter: Payer: Self-pay | Admitting: Internal Medicine

## 2019-03-25 ENCOUNTER — Other Ambulatory Visit: Payer: Self-pay

## 2019-03-25 VITALS — BP 150/90 | HR 72 | Temp 97.1°F | Wt 278.3 lb

## 2019-03-25 DIAGNOSIS — E7800 Pure hypercholesterolemia, unspecified: Secondary | ICD-10-CM | POA: Diagnosis not present

## 2019-03-25 DIAGNOSIS — K219 Gastro-esophageal reflux disease without esophagitis: Secondary | ICD-10-CM

## 2019-03-25 DIAGNOSIS — I10 Essential (primary) hypertension: Secondary | ICD-10-CM | POA: Diagnosis not present

## 2019-03-25 DIAGNOSIS — I5032 Chronic diastolic (congestive) heart failure: Secondary | ICD-10-CM

## 2019-03-25 DIAGNOSIS — R7302 Impaired glucose tolerance (oral): Secondary | ICD-10-CM

## 2019-03-25 MED ORDER — HYDRALAZINE HCL 50 MG PO TABS: 50.0000 mg | ORAL_TABLET | Freq: Three times a day (TID) | ORAL | 1 refills | Status: DC

## 2019-03-25 MED ORDER — FUROSEMIDE 20 MG PO TABS: 20.0000 mg | ORAL_TABLET | Freq: Every day | ORAL | 1 refills | Status: DC

## 2019-03-25 MED ORDER — LOSARTAN POTASSIUM 100 MG PO TABS: 100.0000 mg | ORAL_TABLET | Freq: Every day | ORAL | 1 refills | Status: DC

## 2019-03-25 MED ORDER — METOPROLOL TARTRATE 25 MG PO TABS: 25.0000 mg | ORAL_TABLET | Freq: Two times a day (BID) | ORAL | 1 refills | Status: DC

## 2019-03-25 NOTE — Progress Notes (Signed)
Established Patient Office Visit     This visit occurred during the SARS-CoV-2 public health emergency.  Safety protocols were in place, including screening questions prior to the visit, additional usage of staff PPE, and extensive cleaning of exam room while observing appropriate contact time as indicated for disinfecting solutions.    CC/Reason for Visit: BP follow up  HPI: Jamie [name] is a 59 y.o. female who is coming in today for the above mentioned reasons. Past Medical History is significant for: Uncontrolled hypertension, new diagnosis of diastolic congestive heart failure, morbid obesity, hyperlipidemia and impaired glucose tolerance. She had COVID in February. Stopped taking her medications as she felt quite ill. She has now completely recovered from her illness. Wanted to schedule this appointment to get back on her medications. Not surprisingly, her home BP log shows elevated BPs. She denies CP, SOB. Has had some heartburn. Has been taking protonix daily.   Past Medical/Surgical History: Past Medical History:  Diagnosis Date  . Acquired dilation of ascending aorta and aortic root ([employer])    78mm by 2D echo 03/2019  . Allergy   . Arthritis    in back  . Chest pain    neg cath 2016 after false positive myoview  . Depression   . GERD (gastroesophageal reflux disease)   . Hyperglycemia   . Hyperlipidemia   . Hypertension   . Joint pain   . Knee pain   . Low back pain   . Migraine   . Mitral valve prolapse   . Muscle cramps   . Pulmonary HTN ([place])    mild with PASP 42mmHg by echo 03/2019    Past Surgical History:  Procedure Laterality Date  . BACK SURGERY    . CESAREAN SECTION    . LEFT HEART CATHETERIZATION WITH CORONARY ANGIOGRAM N/A 07/08/2013   Procedure: LEFT HEART CATHETERIZATION WITH CORONARY ANGIOGRAM;  Surgeon: Josue Hector, MD;  Location[facility] CATH LAB;  Service: Cardiovascular;  Laterality: N/A;  . TUBAL LIGATION      Social History:  reports  that she quit smoking about 22 years ago. Her smoking use included cigarettes. She has never used smokeless tobacco. She reports current alcohol use. She reports that she does not use drugs.  Allergies: No Known Allergies  Family History:  Family History  Problem Relation Age of Onset  . Hyperlipidemia Mother   . Hypertension Mother   . AAA (abdominal aortic aneurysm) Mother   . Stroke Father   . Hypertension Father   . Hyperlipidemia Brother        x2  . Hypertension Brother        x2  . Liver cancer Maternal Aunt   . Breast cancer Cousin   . Colon cancer Cousin   . Colon polyps Neg Hx   . Esophageal cancer Neg Hx   . Stomach cancer Neg Hx   . Rectal cancer Neg Hx      Current Outpatient Medications:  .  acetaminophen (TYLENOL) 500 MG tablet, Take 2 tablets (1,000 mg total) by mouth at bedtime., Disp: 30 tablet, Rfl: 0 .  Ascorbic Acid (VITAMIN C) 100 MG tablet, Take 100 mg by mouth daily., Disp: , Rfl:  .  aspirin EC 81 MG tablet, Take 1 tablet (81 mg total) by mouth daily., Disp:  , Rfl:  .  atorvastatin (LIPITOR) 80 MG tablet, Take 1 tablet (80 mg total) by mouth daily., Disp: 90 tablet, Rfl: 1 .  diclofenac sodium (  VOLTAREN) 1 % GEL, Apply 2 g topically 4 (four) times daily as needed (pain)., Disp: , Rfl:  .  ELDERBERRY PO, Take by mouth., Disp: , Rfl:  .  furosemide (LASIX) 20 MG tablet, Take 1 tablet (20 mg total) by mouth daily., Disp: 90 tablet, Rfl: 1 .  gabapentin (NEURONTIN) 300 MG capsule, Take 300-600 mg by mouth at bedtime. , Disp: , Rfl:  .  glucosamine-chondroitin 500-400 MG tablet, Take 1 tablet by mouth 3 (three) times daily., Disp: , Rfl:  .  hydrALAZINE (APRESOLINE) 50 MG tablet, Take 1 tablet (50 mg total) by mouth 3 (three) times daily., Disp: 180 tablet, Rfl: 1 .  Ibuprofen-Famotidine 800-26.6 MG TABS, Take 1 tablet by mouth as directed., Disp: , Rfl:  .  losartan (COZAAR) 100 MG tablet, Take 1 tablet (100 mg total) by mouth daily., Disp: 90 tablet, Rfl:  1 .  metoprolol tartrate (LOPRESSOR) 25 MG tablet, Take 1 tablet (25 mg total) by mouth 2 (two) times daily., Disp: 180 tablet, Rfl: 1 .  Multiple Vitamin (MULTIVITAMIN) tablet, Take 1 tablet by mouth daily., Disp: , Rfl:  .  OVER THE COUNTER MEDICATION, Apply 1 application topically daily as needed (pain). Red Oil, Disp: , Rfl:  .  pantoprazole (PROTONIX) 40 MG tablet, Take 1 tablet (40 mg total) by mouth daily., Disp: 30 tablet, Rfl: 11 .  triamcinolone cream (KENALOG) 0.1 %, APPLY 1 APPLICATION TOPICALLY DAILY AS NEEDED., Disp: 45 g, Rfl: 0 .  valACYclovir (VALTREX) 500 MG tablet, Take 1 tablet (500 mg total) by mouth daily., Disp: , Rfl:  .  VITAMIN D PO, Take 1 tablet by mouth daily., Disp: , Rfl:  .  magnesium 30 MG tablet, Take 30 mg by mouth 2 (two) times daily., Disp: , Rfl:   Review of Systems:  Constitutional: Denies fever, chills, diaphoresis, appetite change and fatigue.  HEENT: Denies photophobia, eye pain, redness, hearing loss, ear pain, congestion, sore throat, rhinorrhea, sneezing, mouth sores, trouble swallowing, neck pain, neck stiffness and tinnitus.   Respiratory: Denies SOB, DOE, cough, chest tightness,  and wheezing.   Cardiovascular: Denies chest pain, palpitations and leg swelling.  Gastrointestinal: Denies nausea, vomiting, abdominal pain, diarrhea, constipation, blood in stool and abdominal distention.  Genitourinary: Denies dysuria, urgency, frequency, hematuria, flank pain and difficulty urinating.  Endocrine: Denies: hot or cold intolerance, sweats, changes in hair or nails, polyuria, polydipsia. Musculoskeletal: Denies myalgias, back pain, joint swelling, arthralgias and gait problem.  Skin: Denies pallor, rash and wound.  Neurological: Denies dizziness, seizures, syncope, weakness, light-headedness, numbness and headaches.  Hematological: Denies adenopathy. Easy bruising, personal or family bleeding history  Psychiatric/Behavioral: Denies suicidal ideation, mood  changes, confusion, nervousness, sleep disturbance and agitation    Physical Exam: Vitals:   03/25/19 0658  BP: (!) 150/90  Pulse: 72  Temp: (!) 97.1 F (36.2 C)  TempSrc: Temporal  SpO2: 96%  Weight: 278 lb 4.8 oz (126.2 kg)    Body mass index is 39.37 kg/m.   Constitutional: NAD, calm, comfortable Eyes: PERRL, lids and conjunctivae normal ENMT: Mucous membranes are moist.  Respiratory: clear to auscultation bilaterally, no wheezing, no crackles. Normal respiratory effort. No accessory muscle use.  Cardiovascular: Regular rate and rhythm, no murmurs / rubs / gallops. 1+ bilateral lower extremity edema.  Neurologic: grossly intact and non-focal.  Psychiatric: Normal judgment and insight. Alert and oriented x 3. Normal mood.    Impression and Plan:  Essential hypertension -Not controlled. -Start back on lasix 20, metoprolol 25  BID, losartan 100, hydralazine 50 TID. -Return in 6 weeks for BP follow up. Continue ambulatory BP measurements. -Low sodium diet discussed.  Pure hypercholesterolemia -Last LDL 124 in 8/20. -On statin.  Chronic diastolic CHF (congestive heart failure) (HCC) -Compensated. -On BB/ARB/ASA/statin.  Gastroesophageal reflux disease, unspecified whether esophagitis present -Continue PPI daily.  IGT (impaired glucose tolerance) -A1c was 6.0 in 8/20. -Continue lifestyle modifications.  Morbid obesity ([place]) -Discussed healthy lifestyle, including increased physical activity and better food choices to promote weight loss.    Patient Instructions  -Nice seeing you today!!  -Start back on: lasix 20 mg daily, metoprolol 25 mg twice daily, hydralazine 50 mg three times a day and losartan 100 mg daily.  -Schedule follow up in 6 weeks.  -Low salt diet.     Lelon Frohlich, MD [facility]

## 2019-03-25 NOTE — Patient Instructions (Signed)
-  Nice seeing you today!!  -Start back on: lasix 20 mg daily, metoprolol 25 mg twice daily, hydralazine 50 mg three times a day and losartan 100 mg daily.  -Schedule follow up in 6 weeks.  -Low salt diet.

## 2019-04-13 NOTE — Progress Notes (Deleted)
Virtual Visit via Telephone Note   This visit type was conducted due to national recommendations for restrictions regarding the COVID-19 Pandemic (e.g. social distancing) in an effort to limit this patient's exposure and mitigate transmission in our community.  Due to her co-morbid illnesses, this patient is at least at moderate risk for complications without adequate follow up.  This format is felt to be most appropriate for this patient at this time.  The patient did not have access to video technology/had technical difficulties with video requiring transitioning to audio format only (telephone).  All issues noted in this document were discussed and addressed.  No physical exam could be performed with this format.  Please refer to the patient's chart for her  consent to telehealth for [facility].  Evaluation Performed:  Follow-up visit  This visit type was conducted due to national recommendations for restrictions regarding the COVID-19 Pandemic (e.g. social distancing).  This format is felt to be most appropriate for this patient at this time.  All issues noted in this document were discussed and addressed.  No physical exam was performed (except for noted visual exam findings with Video Visits).  Please refer to the patient's chart (MyChart message for video visits and phone note for telephone visits) for the patient's consent to telehealth for [facility].  Date:  04/13/2019   ID:  Jamie [name], DOB Dec 22, 1960, MRN [id number]  Patient Location:  Home  Provider location:   [place]  PCP:  Isaac Bliss, Rayford Halsted, MD  Cardiologist:  Fransico Him, MD  Electrophysiologist:  None   Chief Complaint:  CP and SOB  History of Present Illness:    Jamie [name] is a 59 y.o. female who presents via audio/video conferencing for a telehealth visit today.    The patient {does/does not:200015} have symptoms concerning for COVID-19 infection (fever, chills, cough, or new  shortness of breath).    Prior CV studies:   The following studies were reviewed today:  ***  Past Medical History:  Diagnosis Date  . Acquired dilation of ascending aorta and aortic root ([place])    28mm by 2D echo 03/2019  . Allergy   . Arthritis    in back  . Chest pain    neg cath 2016 after false positive myoview  . Depression   . GERD (gastroesophageal reflux disease)   . Hyperglycemia   . Hyperlipidemia   . Hypertension   . Joint pain   . Knee pain   . Low back pain   . Migraine   . Mitral valve prolapse   . Muscle cramps   . Pulmonary HTN ([place])    mild with PASP 63mmHg by echo 03/2019   Past Surgical History:  Procedure Laterality Date  . BACK SURGERY    . CESAREAN SECTION    . LEFT HEART CATHETERIZATION WITH CORONARY ANGIOGRAM N/A 07/08/2013   Procedure: LEFT HEART CATHETERIZATION WITH CORONARY ANGIOGRAM;  Surgeon: Josue Hector, MD;  Location: [facility] CATH LAB;  Service: Cardiovascular;  Laterality: N/A;  . TUBAL LIGATION       No outpatient medications have been marked as taking for the 04/14/19 encounter (Appointment) with Sueanne Margarita, MD.     Allergies:   Patient has no known allergies.   Social History   Tobacco Use  . Smoking status: Former Smoker    Types: Cigarettes    Quit date: 07/31/1996    Years since quitting: 22.7  . Smokeless tobacco: Never Used  Substance  Use Topics  . Alcohol use: Yes    Comment: very little  . Drug use: No     Family Hx: The patient's family history includes AAA (abdominal aortic aneurysm) in her mother; Breast cancer in her cousin; Colon cancer in her cousin; Hyperlipidemia in her brother and mother; Hypertension in her brother, father, and mother; Liver cancer in her maternal aunt; Stroke in her father. There is no history of Colon polyps, Esophageal cancer, Stomach cancer, or Rectal cancer.  ROS:   Please see the history of present illness.    *** All other systems reviewed and are negative.   Labs/Other Tests  and Data Reviewed:    Recent Labs: 08/14/2018: ALT 16; BUN 13; Creatinine, Ser 0.82; Hemoglobin 12.8; Platelets 229.0; Potassium 4.0; Sodium 142; TSH 2.22 03/10/2019: NT-Pro BNP 130   Recent Lipid Panel Lab Results  Component Value Date/Time   CHOL 194 08/14/2018 08:16 AM   CHOL 193 10/01/2017 11:50 AM   TRIG 94.0 08/14/2018 08:16 AM   HDL 51.10 08/14/2018 08:16 AM   HDL 51 10/01/2017 11:50 AM   CHOLHDL 4 08/14/2018 08:16 AM   LDLCALC 124 (H) 08/14/2018 08:16 AM   LDLCALC 121 (H) 10/01/2017 11:50 AM    Wt Readings from Last 3 Encounters:  03/25/19 278 lb 4.8 oz (126.2 kg)  01/22/19 274 lb 12.8 oz (124.6 kg)  12/30/18 278 lb 3.2 oz (126.2 kg)     Objective:    Vital Signs:  LMP 10/04/2014    CONSTITUTIONAL:  Well nourished, well developed female in no*** acute distress.  EYES: anicteric MOUTH: oral mucosa is pink RESPIRATORY: Normal respiratory effort, symmetric expansion CARDIOVASCULAR: No peripheral edema SKIN: No rash, lesions or ulcers MUSCULOSKELETAL: no digital cyanosis NEURO: Cranial Nerves II-XII grossly intact, moves all extremities PSYCH: Intact judgement and insight.  A&O x 3, Mood/affect appropriate   ASSESSMENT & PLAN:    1.  ***  COVID-19 Education: The signs and symptoms of COVID-19 were discussed with the patient and how to seek care for testing (follow up with PCP or arrange E-visit).  The importance of social distancing was discussed today.  Patient Risk:   After full review of this patient's clinical status, I feel that they are at least moderate risk at this time.  Time:   Today, I have spent *** minutes on *** discussing medical problems including *** and reviewing patient's chart including ***.  Medication Adjustments/Labs and Tests Ordered: Current medicines are reviewed at length with the patient today.  Concerns regarding medicines are outlined above.  Tests Ordered: No orders of the defined types were placed in this  encounter.  Medication Changes: No orders of the defined types were placed in this encounter.   Disposition:  Follow up {follow up:15908}  Signed, Fransico Him, MD  04/13/2019 10:48 PM    [facility]

## 2019-04-14 ENCOUNTER — Telehealth: Payer: 59 | Admitting: Cardiology

## 2019-04-14 DIAGNOSIS — N6001 Solitary cyst of right breast: Secondary | ICD-10-CM | POA: Insufficient documentation

## 2019-04-14 DIAGNOSIS — I509 Heart failure, unspecified: Secondary | ICD-10-CM | POA: Insufficient documentation

## 2019-04-14 DIAGNOSIS — R739 Hyperglycemia, unspecified: Secondary | ICD-10-CM | POA: Insufficient documentation

## 2019-05-20 ENCOUNTER — Telehealth: Payer: Self-pay | Admitting: *Deleted

## 2019-05-20 ENCOUNTER — Telehealth (INDEPENDENT_AMBULATORY_CARE_PROVIDER_SITE_OTHER): Payer: 59 | Admitting: Cardiology

## 2019-05-20 ENCOUNTER — Other Ambulatory Visit: Payer: Self-pay

## 2019-05-20 ENCOUNTER — Encounter: Payer: Self-pay | Admitting: Cardiology

## 2019-05-20 VITALS — BP 170/102 | HR 66 | Ht 70.5 in | Wt 279.0 lb

## 2019-05-20 DIAGNOSIS — R0602 Shortness of breath: Secondary | ICD-10-CM | POA: Diagnosis not present

## 2019-05-20 DIAGNOSIS — I5032 Chronic diastolic (congestive) heart failure: Secondary | ICD-10-CM

## 2019-05-20 DIAGNOSIS — R079 Chest pain, unspecified: Secondary | ICD-10-CM | POA: Diagnosis not present

## 2019-05-20 DIAGNOSIS — I10 Essential (primary) hypertension: Secondary | ICD-10-CM | POA: Diagnosis not present

## 2019-05-20 DIAGNOSIS — I272 Pulmonary hypertension, unspecified: Secondary | ICD-10-CM

## 2019-05-20 DIAGNOSIS — E7800 Pure hypercholesterolemia, unspecified: Secondary | ICD-10-CM

## 2019-05-20 DIAGNOSIS — R002 Palpitations: Secondary | ICD-10-CM

## 2019-05-20 MED ORDER — HYDRALAZINE HCL 50 MG PO TABS: 75.0000 mg | ORAL_TABLET | Freq: Three times a day (TID) | ORAL | 3 refills | Status: DC

## 2019-05-20 MED ORDER — FUROSEMIDE 20 MG PO TABS: 20.0000 mg | ORAL_TABLET | Freq: Every day | ORAL | 3 refills | Status: DC

## 2019-05-20 NOTE — Progress Notes (Signed)
Virtual Visit via Telephone Note   This visit type was conducted due to national recommendations for restrictions regarding the COVID-19 Pandemic (e.g. social distancing) in an effort to limit this patient's exposure and mitigate transmission in our community.  Due to her co-morbid illnesses, this patient is at least at moderate risk for complications without adequate follow up.  This format is felt to be most appropriate for this patient at this time.  The patient did not have access to video technology/had technical difficulties with video requiring transitioning to audio format only (telephone).  All issues noted in this document were discussed and addressed.  No physical exam could be performed with this format.  Please refer to the patient's chart for her  consent to telehealth for [facility].   Evaluation Performed:  Follow-up visit  This visit type was conducted due to national recommendations for restrictions regarding the COVID-19 Pandemic (e.g. social distancing).  This format is felt to be most appropriate for this patient at this time.  All issues noted in this document were discussed and addressed.  No physical exam was performed (except for noted visual exam findings with Video Visits).  Please refer to the patient's chart (MyChart message for video visits and phone note for telephone visits) for the patient's consent to telehealth for [facility].  Date:  05/20/2019   ID:  Jamie [name], DOB 10/29/1960, MRN [id number]  Patient Location:  Home  Provider location:   [place]  PCP:  Isaac Bliss, Rayford Halsted, MD  Cardiologist:  Fransico Him, MD  Electrophysiologist:  None   Chief Complaint:  Chest pain and SOB  History of Present Illness:    Jamie [name] is a 59 y.o. female who presents via audio/video conferencing for a telehealth visit today.    This is a 59yo AAF with a hx of CP in the past with false positive myoview and normal coronary arteries in  2016.  She has a hx of GERD, HTN, HLD and MVP by remote echo.  She saw me in Jan 2021 with recurrent CP and DOE.  2D echo Aug 2020 was normal with G1DD.  CP was atypical (sharp and stabbing) and belching improved her sx but sometimes would radiate into her left arm with tingling.  She also complained of some palpitations.  She underwent coronary CTA showing a Ca++ score of 0 and no CAD.  Repeat 2D echo showed normal LVF with G2DD and mild pulmonary HTN with PASP 74mmHg and mildly dilated ascending aorta at 37mm.   Pro-BNP was normal.    She is here today for followup and is doing well.  Her BP has not been well controlled. She admits to salting her food and getting take out more often.  She has not had any further chest pain or SOB.  She still occasionally has some skipped heart beats.  Unfortunately she did not have any palpitations while she was wearing her device. She denies any  PND, orthopnea,  dizzinessor syncope. She still has occasional LE edema and hand edema. She is compliant with her meds and is tolerating meds with no SE.     Prior CV studies:   The following studies were reviewed today:  2D echo, coronary CTA, event monitor  Past Medical History:  Diagnosis Date  . Acquired dilation of ascending aorta and aortic root ([place])    1mm by 2D echo 03/2019  . Allergy   . Arthritis    in back  . Chest  pain    neg cath 2016 after false positive myoview  . Depression   . GERD (gastroesophageal reflux disease)   . Hyperglycemia   . Hyperlipidemia   . Hypertension   . Joint pain   . Knee pain   . Low back pain   . Migraine   . Mitral valve prolapse   . Muscle cramps   . Pulmonary HTN ([place])    mild with PASP 87mmHg by echo 03/2019   Past Surgical History:  Procedure Laterality Date  . BACK SURGERY    . CESAREAN SECTION    . LEFT HEART CATHETERIZATION WITH CORONARY ANGIOGRAM N/A 07/08/2013   Procedure: LEFT HEART CATHETERIZATION WITH CORONARY ANGIOGRAM;  Surgeon: Josue Hector, MD;   Location: [facility] CATH LAB;  Service: Cardiovascular;  Laterality: N/A;  . TUBAL LIGATION       No outpatient medications have been marked as taking for the 05/20/19 encounter (Video Visit) with Sueanne Margarita, MD.     Allergies:   Patient has no known allergies.   Social History   Tobacco Use  . Smoking status: Former Smoker    Types: Cigarettes    Quit date: 07/31/1996    Years since quitting: 22.8  . Smokeless tobacco: Never Used  Substance Use Topics  . Alcohol use: Yes    Comment: very little  . Drug use: No     Family Hx: The patient's family history includes AAA (abdominal aortic aneurysm) in her mother; Breast cancer in her cousin; Colon cancer in her cousin; Hyperlipidemia in her brother and mother; Hypertension in her brother, father, and mother; Liver cancer in her maternal aunt; Stroke in her father. There is no history of Colon polyps, Esophageal cancer, Stomach cancer, or Rectal cancer.  ROS:   Please see the history of present illness.     All other systems reviewed and are negative.   Labs/Other Tests and Data Reviewed:    Recent Labs: 08/14/2018: ALT 16; BUN 13; Creatinine, Ser 0.82; Hemoglobin 12.8; Platelets 229.0; Potassium 4.0; Sodium 142; TSH 2.22 03/10/2019: NT-Pro BNP 130   Recent Lipid Panel Lab Results  Component Value Date/Time   CHOL 194 08/14/2018 08:16 AM   CHOL 193 10/01/2017 11:50 AM   TRIG 94.0 08/14/2018 08:16 AM   HDL 51.10 08/14/2018 08:16 AM   HDL 51 10/01/2017 11:50 AM   CHOLHDL 4 08/14/2018 08:16 AM   LDLCALC 124 (H) 08/14/2018 08:16 AM   LDLCALC 121 (H) 10/01/2017 11:50 AM    Wt Readings from Last 3 Encounters:  05/20/19 279 lb (126.6 kg)  03/25/19 278 lb 4.8 oz (126.2 kg)  01/22/19 274 lb 12.8 oz (124.6 kg)     Objective:    Vital Signs:  BP (!) 170/102   Pulse 66   Ht 5' 10.5" (1.791 m)   Wt 279 lb (126.6 kg)   LMP 10/04/2014   BMI 39.47 kg/m    CONSTITUTIONAL:  Well nourished, well developed female in no acute  distress.  EYES: anicteric MOUTH: oral mucosa is pink RESPIRATORY: Normal respiratory effort, symmetric expansion CARDIOVASCULAR: No peripheral edema SKIN: No rash, lesions or ulcers MUSCULOSKELETAL: no digital cyanosis NEURO: Cranial Nerves II-XII grossly intact, moves all extremities PSYCH: Intact judgement and insight.  A&O x 3, Mood/affect appropriate   ASSESSMENT & PLAN:    1. Chest pain -atypical in description and has resolved -she does have CRFs including obesity, HLD and HTN as well as remote tobacco use -EKG nonischemic -Coronary CTA showed no  CAD and Ca score 0  2.  SOB -suspect related to obesity and diastolic dysfunction as well as dietary indiscretion with Na -2D echo showed normal LVF with mild PHTN and G2DD -proBNP was normal  3.  Chronic diastolic CHF -she has chronic LE edema and hand edema that intermittently increases likely related to poor compliance with low Na diet -I encouraged her to avoid any added salt and follow a strict <2gm Na diet -continue Lasix 20mg  daily and take an extra dose when needed for increased edema  4.  HTN -Bp poorly controlled and likely being driven by poor compliance with low Na diet -continue , Losartan 100mg  daily, Lopressor 25mg  BID -increase Hydralazine to 75mg  TID and check Bp and HR daily for a week and call with results -Check BMET  5.  HLD -continue Lipitor 80mg  daily -followed by PCP  6.  Palpitations -Event monitor was normal -still intermittently has some palpitations and did not have any while on heart monitor -I encouraged her to look into getting the [facility] monitoring device  7.  Pulmonary HTN -mildly elevated and likely related to G2DD  -continue diuretics -repeat echo in 1 year to reassess -home sleep study   Time:   Today, I have spent 20 minutes on telemedicine discussing medical problems including CP, SOB, CHF, palpitations and reviewing patient's chart including coronary CTA, 2D echo,  event monitor and outside labs from PCP.  Medication Adjustments/Labs and Tests Ordered: Current medicines are reviewed at length with the patient today.  Concerns regarding medicines are outlined above.  Tests Ordered: No orders of the defined types were placed in this encounter.  Medication Changes: No orders of the defined types were placed in this encounter.   Disposition:  Follow up 6 months  Signed, Fransico Him, MD  05/20/2019 1:05 PM    [facility]

## 2019-05-20 NOTE — Patient Instructions (Addendum)
Medication Instructions:  Your physician has recommended you make the following change in your medication:  1) Increase hydralazine to 75 mg three times per day  2) You make take an extra tablet of Lasix (furosemide) as needed for increased swelling *If you need a refill on your cardiac medications before your next appointment, please call your pharmacy*   Lab Work: BMET on June 4th - you may come in anytime between 7:30am and 4:30pm If you have labs (blood work) drawn today and your tests are completely normal, you will receive your results only by: Marland Kitchen MyChart Message (if you have MyChart) OR . A paper copy in the mail If you have any lab test that is abnormal or we need to change your treatment, we will call you to review the results.   Testing/Procedures: Your physician has recommended that you have a sleep study. This test records several body functions during sleep, including: brain activity, eye movement, oxygen and carbon dioxide blood levels, heart rate and rhythm, breathing rate and rhythm, the flow of air through your mouth and nose, snoring, body muscle movements, and chest and belly movement.  Follow-Up: At [facility], you and your health needs are our priority.  As part of our continuing mission to provide you with exceptional heart care, we have created designated Provider Care Teams.  These Care Teams include your primary Cardiologist (physician) and Advanced Practice Providers (APPs -  Physician Assistants and Nurse Practitioners) who all work together to provide you with the care you need, when you need it.  Your next appointment:   6 month(s)  The format for your next appointment:   In Person  Provider:   Fransico Him, MD  Other Instructions Please take your blood pressure and heart rate daily for one week and send [place] your readings.   Dr. Radford Pax recommends that you wear a [facility] device.    Low-Sodium Eating Plan Sodium, which is an element that makes  up salt, helps you maintain a healthy balance of fluids in your body. Too much sodium can increase your blood pressure and cause fluid and waste to be held in your body. Your health care provider or dietitian may recommend following this plan if you have high blood pressure (hypertension), kidney disease, liver disease, or heart failure. Eating less sodium can help lower your blood pressure, reduce swelling, and protect your heart, liver, and kidneys. What are tips for following this plan? General guidelines  Most people on this plan should limit their sodium intake to 1,500-2,000 mg (milligrams) of sodium each day. Reading food labels   The Nutrition Facts label lists the amount of sodium in one serving of the food. If you eat more than one serving, you must multiply the listed amount of sodium by the number of servings.  Choose foods with less than 140 mg of sodium per serving.  Avoid foods with 300 mg of sodium or more per serving. Shopping  Look for lower-sodium products, often labeled as "low-sodium" or "no salt added."  Always check the sodium content even if foods are labeled as "unsalted" or "no salt added".  Buy fresh foods. ? Avoid canned foods and premade or frozen meals. ? Avoid canned, cured, or processed meats  Buy breads that have less than 80 mg of sodium per slice. Cooking  Eat more home-cooked food and less restaurant, buffet, and fast food.  Avoid adding salt when cooking. Use salt-free seasonings or herbs instead of table salt or sea salt.  Check with your health care provider or pharmacist before using salt substitutes.  Cook with plant-based oils, such as canola, sunflower, or olive oil. Meal planning  When eating at a restaurant, ask that your food be prepared with less salt or no salt, if possible.  Avoid foods that contain MSG (monosodium glutamate). MSG is sometimes added to [place] food, bouillon, and some canned foods. What foods are recommended? The  items listed may not be a complete list. Talk with your dietitian about what dietary choices are best for you. Grains Low-sodium cereals, including oats, puffed wheat and rice, and shredded wheat. Low-sodium crackers. Unsalted rice. Unsalted pasta. Low-sodium bread. Whole-grain breads and whole-grain pasta. Vegetables Fresh or frozen vegetables. "No salt added" canned vegetables. "No salt added" tomato sauce and paste. Low-sodium or reduced-sodium tomato and vegetable juice. Fruits Fresh, frozen, or canned fruit. Fruit juice. Meats and other protein foods Fresh or frozen (no salt added) meat, poultry, seafood, and fish. Low-sodium canned tuna and salmon. Unsalted nuts. Dried peas, beans, and lentils without added salt. Unsalted canned beans. Eggs. Unsalted nut butters. Dairy Milk. Soy milk. Cheese that is naturally low in sodium, such as ricotta cheese, fresh mozzarella, or Swiss cheese Low-sodium or reduced-sodium cheese. Cream cheese. Yogurt. Fats and oils Unsalted butter. Unsalted margarine with no trans fat. Vegetable oils such as canola or olive oils. Seasonings and other foods Fresh and dried herbs and spices. Salt-free seasonings. Low-sodium mustard and ketchup. Sodium-free salad dressing. Sodium-free light mayonnaise. Fresh or refrigerated horseradish. Lemon juice. Vinegar. Homemade, reduced-sodium, or low-sodium soups. Unsalted popcorn and pretzels. Low-salt or salt-free chips. What foods are not recommended? The items listed may not be a complete list. Talk with your dietitian about what dietary choices are best for you. Grains Instant hot cereals. Bread stuffing, pancake, and biscuit mixes. Croutons. Seasoned rice or pasta mixes. Noodle soup cups. Boxed or frozen macaroni and cheese. Regular salted crackers. Self-rising flour. Vegetables Sauerkraut, pickled vegetables, and relishes. Olives. [place] fries. Onion rings. Regular canned vegetables (not low-sodium or reduced-sodium). Regular  canned tomato sauce and paste (not low-sodium or reduced-sodium). Regular tomato and vegetable juice (not low-sodium or reduced-sodium). Frozen vegetables in sauces. Meats and other protein foods Meat or fish that is salted, canned, smoked, spiced, or pickled. Bacon, ham, sausage, hotdogs, corned beef, chipped beef, packaged lunch meats, salt pork, jerky, pickled herring, anchovies, regular canned tuna, sardines, salted nuts. Dairy Processed cheese and cheese spreads. Cheese curds. Blue cheese. Feta cheese. String cheese. Regular cottage cheese. Buttermilk. Canned milk. Fats and oils Salted butter. Regular margarine. Ghee. Bacon fat. Seasonings and other foods Onion salt, garlic salt, seasoned salt, table salt, and sea salt. Canned and packaged gravies. Worcestershire sauce. Tartar sauce. Barbecue sauce. Teriyaki sauce. Soy sauce, including reduced-sodium. Steak sauce. Fish sauce. Oyster sauce. Cocktail sauce. Horseradish that you find on the shelf. Regular ketchup and mustard. Meat flavorings and tenderizers. Bouillon cubes. Hot sauce and Tabasco sauce. Premade or packaged marinades. Premade or packaged taco seasonings. Relishes. Regular salad dressings. Salsa. Potato and tortilla chips. Corn chips and puffs. Salted popcorn and pretzels. Canned or dried soups. Pizza. Frozen entrees and pot pies. Summary  Eating less sodium can help lower your blood pressure, reduce swelling, and protect your heart, liver, and kidneys.  Most people on this plan should limit their sodium intake to 1,500-2,000 mg (milligrams) of sodium each day.  Canned, boxed, and frozen foods are high in sodium. Restaurant foods, fast foods, and pizza are also very high in sodium.  You also get sodium by adding salt to food.  Try to cook at home, eat more fresh fruits and vegetables, and eat less fast food, canned, processed, or prepared foods. This information is not intended to replace advice given to you by your health care  provider. Make sure you discuss any questions you have with your health care provider. Document Revised: 11/30/2016 Document Reviewed: 12/12/2015 Elsevier Patient Education  2020 [employer].

## 2019-05-20 NOTE — Telephone Encounter (Signed)
-----   Message from Antonieta Iba, RN sent at 05/20/2019  1:41 PM EDT ----- Regarding: Home sleep study Home sleep study has been ordered.  Thanks!

## 2019-06-01 ENCOUNTER — Telehealth: Payer: Self-pay | Admitting: *Deleted

## 2019-06-01 NOTE — Telephone Encounter (Signed)
Staff message sent to Nina ok to schedule HST. [employer] does not require a PA. 

## 2019-06-03 NOTE — Telephone Encounter (Signed)
RE: Home sleep study Lauralee Evener, CMA  Freada Bergeron, CMA  Ok to schedule HST. [employer] does not require a PA.

## 2019-06-03 NOTE — Telephone Encounter (Signed)
Patient is aware and agreeable to Home Sleep Study through [facility]. Patient is scheduled for 8/9 at 10 to pick up home sleep kit and meet with Respiratory therapist at [facility]. Patient is aware that if this appointment date and time does not work for them they should contact [facility] directly at [phone]. Patient is aware that a sleep packet will be sent from [facility] in week. Patient is agreeable to treatment and thankful for call.  

## 2019-06-05 ENCOUNTER — Other Ambulatory Visit: Payer: Self-pay

## 2019-06-05 ENCOUNTER — Other Ambulatory Visit: Payer: 59 | Admitting: *Deleted

## 2019-06-05 DIAGNOSIS — R072 Precordial pain: Secondary | ICD-10-CM

## 2019-06-05 DIAGNOSIS — R079 Chest pain, unspecified: Secondary | ICD-10-CM

## 2019-06-05 DIAGNOSIS — R0602 Shortness of breath: Secondary | ICD-10-CM

## 2019-06-05 LAB — BASIC METABOLIC PANEL
BUN/Creatinine Ratio: 14 (ref 9–23)
BUN: 12 mg/dL (ref 6–24)
CO2: 24 mmol/L (ref 20–29)
Calcium: 9.2 mg/dL (ref 8.7–10.2)
Chloride: 104 mmol/L (ref 96–106)
Creatinine, Ser: 0.84 mg/dL (ref 0.57–1.00)
GFR calc Af Amer: 89 mL/min/{1.73_m2} (ref >59)
GFR calc non Af Amer: 77 mL/min/{1.73_m2} (ref >59)
Glucose: 70 mg/dL (ref 65–99)
Potassium: 4 mmol/L (ref 3.5–5.2)
Sodium: 142 mmol/L (ref 134–144)

## 2019-07-01 ENCOUNTER — Other Ambulatory Visit: Payer: Self-pay | Admitting: Internal Medicine

## 2019-07-01 DIAGNOSIS — E785 Hyperlipidemia, unspecified: Secondary | ICD-10-CM

## 2019-07-12 IMAGING — CR DG CHEST 2V
3 series · 3 of 3 positions shown · non-contrast
Comparison: 10/06/2017

CLINICAL DATA: Left-sided chest pain.

EXAM:
CHEST - 2 VIEW

[w chest pa]
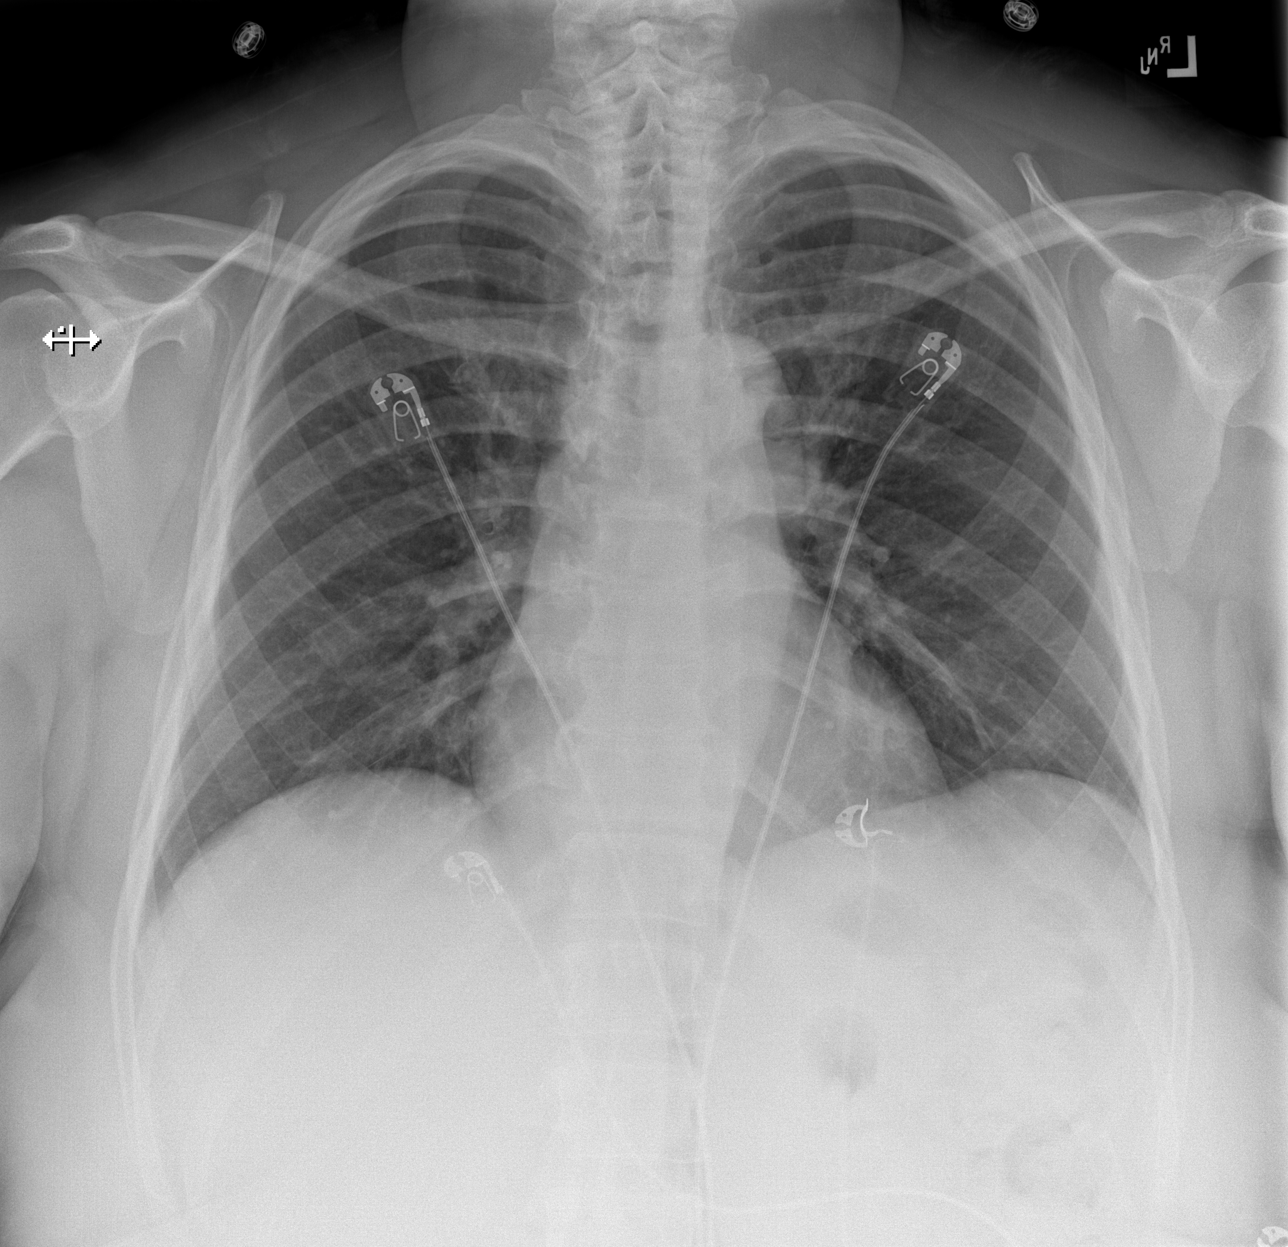

[w chest lat (1 of 2)]
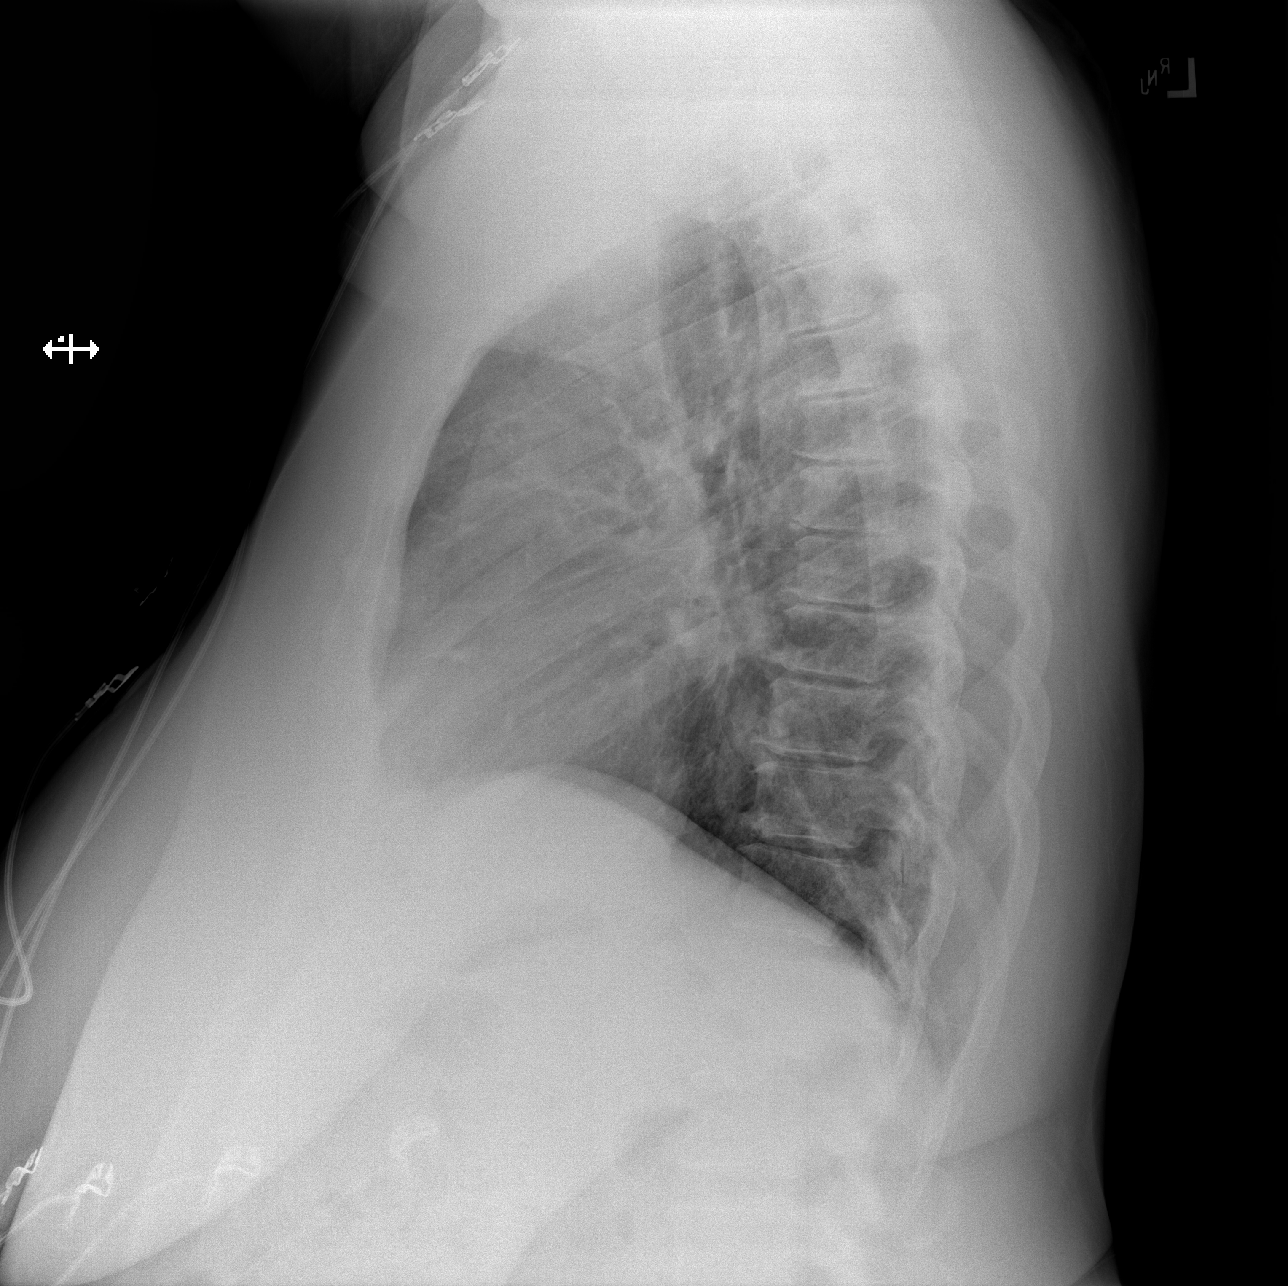

[w chest lat (2 of 2)]
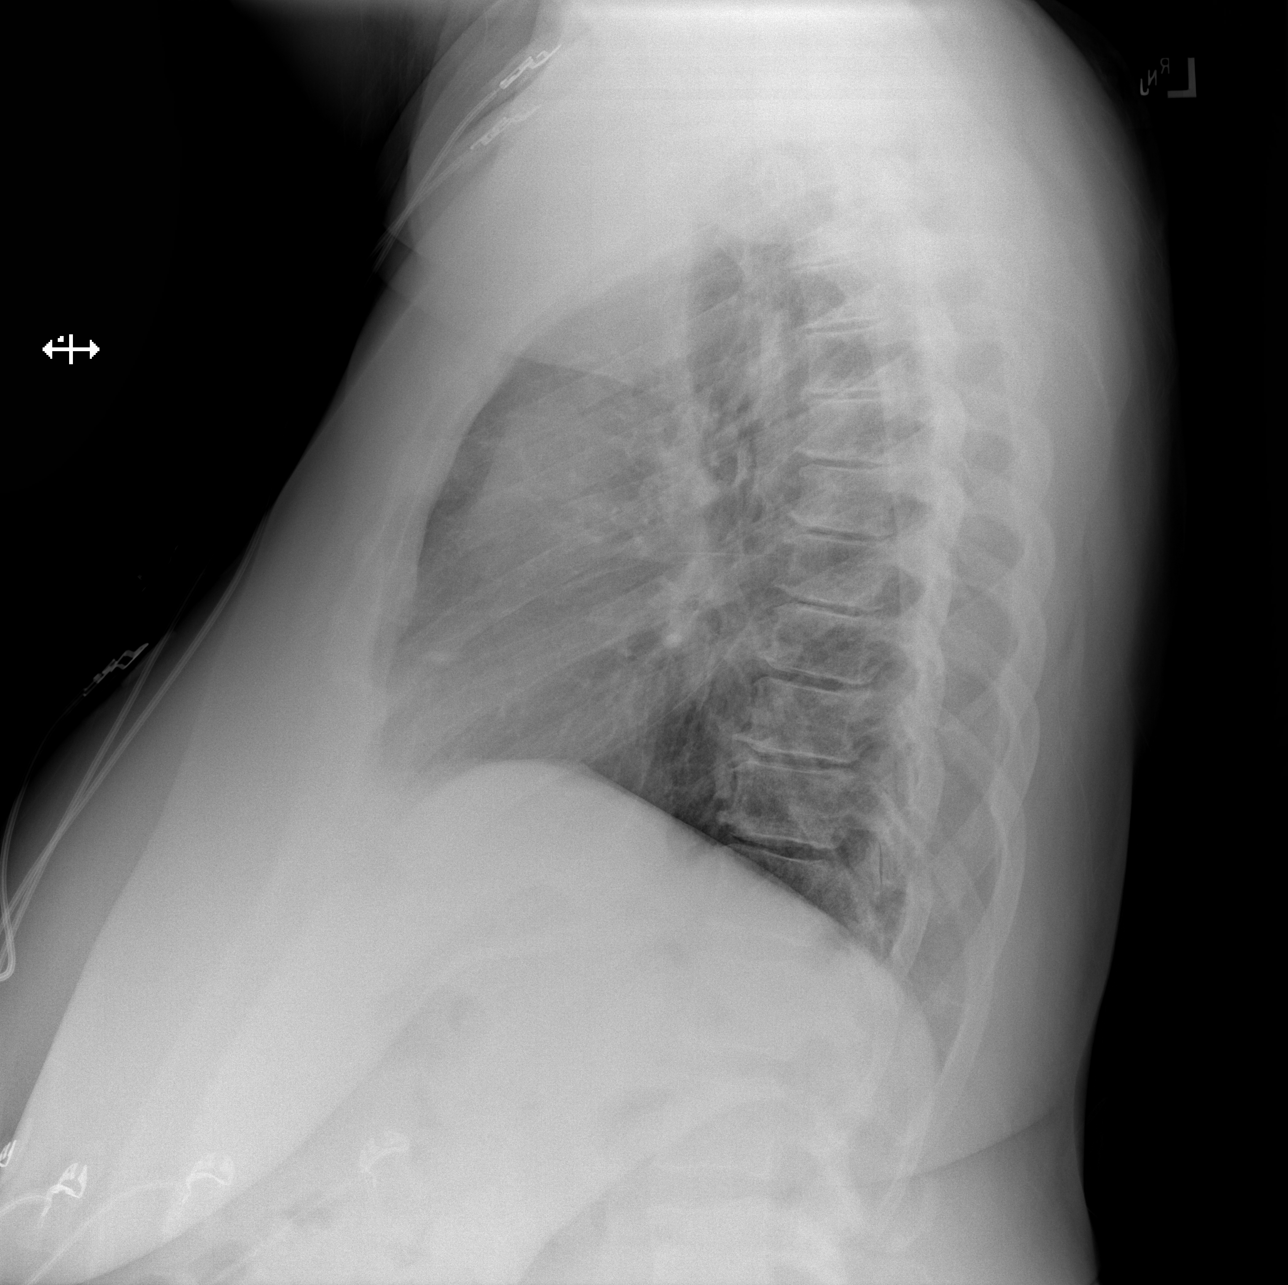

[3 of 3 positions shown; findings below may reference images not displayed]

FINDINGS: The heart size and mediastinal contours are within normal limits.
There is no evidence of pulmonary edema, consolidation,
pneumothorax, nodule or pleural fluid. The visualized skeletal
structures are unremarkable.
IMPRESSION: No active cardiopulmonary disease.

## 2019-07-15 ENCOUNTER — Telehealth: Payer: 59 | Admitting: Internal Medicine

## 2019-07-23 ENCOUNTER — Telehealth (INDEPENDENT_AMBULATORY_CARE_PROVIDER_SITE_OTHER): Payer: 59 | Admitting: Internal Medicine

## 2019-07-23 ENCOUNTER — Encounter: Payer: Self-pay | Admitting: Internal Medicine

## 2019-07-23 ENCOUNTER — Other Ambulatory Visit: Payer: Self-pay

## 2019-07-23 VITALS — BP 150/90 | HR 71 | Temp 98.1°F | Wt 287.7 lb

## 2019-07-23 DIAGNOSIS — R7303 Prediabetes: Secondary | ICD-10-CM | POA: Diagnosis not present

## 2019-07-23 DIAGNOSIS — I10 Essential (primary) hypertension: Secondary | ICD-10-CM

## 2019-07-23 DIAGNOSIS — E785 Hyperlipidemia, unspecified: Secondary | ICD-10-CM

## 2019-07-23 DIAGNOSIS — I5032 Chronic diastolic (congestive) heart failure: Secondary | ICD-10-CM | POA: Diagnosis not present

## 2019-07-23 DIAGNOSIS — R7302 Impaired glucose tolerance (oral): Secondary | ICD-10-CM

## 2019-07-23 LAB — POCT GLYCOSYLATED HEMOGLOBIN (HGB A1C): Hemoglobin A1C: 5.6 % (ref 4.0–5.6)

## 2019-07-23 MED ORDER — ATORVASTATIN CALCIUM 80 MG PO TABS: 80.0000 mg | ORAL_TABLET | Freq: Every day | ORAL | 0 refills | Status: DC

## 2019-07-23 MED ORDER — LOSARTAN POTASSIUM 100 MG PO TABS: 100.0000 mg | ORAL_TABLET | Freq: Every day | ORAL | 1 refills | Status: DC

## 2019-07-23 MED ORDER — HYDRALAZINE HCL 50 MG PO TABS: 50.0000 mg | ORAL_TABLET | Freq: Three times a day (TID) | ORAL | 3 refills | Status: DC

## 2019-07-23 MED ORDER — FUROSEMIDE 20 MG PO TABS: 20.0000 mg | ORAL_TABLET | Freq: Every day | ORAL | 3 refills | Status: DC

## 2019-07-23 NOTE — Progress Notes (Signed)
Established Patient Office Visit     This visit occurred during the SARS-CoV-2 public health emergency.  Safety protocols were in place, including screening questions prior to the visit, additional usage of staff PPE, and extensive cleaning of exam room while observing appropriate contact time as indicated for disinfecting solutions.    CC/Reason for Visit: Follow-up chronic medical conditions  HPI: Jamie [name] is a 59 y.o. female who is coming in today for the above mentioned reasons. Past Medical History is significant for: Uncontrolled hypertension, new diagnosis of diastolic congestive heart failure, morbid obesity, hyperlipidemia and impaired glucose tolerance.  I last saw her in March.  At that time her blood pressure was uncontrolled after a bout of COVID during which she stopped taking her medications.  She was resumed on her medications and was supposed to follow-up in 6 weeks.  Unfortunately I am seeing her 4 months later.  She has been having headaches, dizziness and lightheadedness for about a month.  Her blood pressures at home have been very elevated, per her report in the 180/100 range.  In the office today it is 150/90.  She denies chest pain, shortness of breath, blurry vision or focal neurologic deficits.  In regards to blood pressure she is supposed to be on Lasix 20 mg, metoprolol 25 mg twice daily, losartan 100 mg daily and hydralazine 50 mg 3 times daily.  After reviewing her medications with her in detail, it appears that she has not been taking losartan.  She is otherwise doing well.   Past Medical/Surgical History: Past Medical History:  Diagnosis Date  . Acquired dilation of ascending aorta and aortic root ([place])    11mm by 2D echo 03/2019  . Allergy   . Arthritis    in back  . Chest pain    neg cath 2016 after false positive myoview  . Depression   . GERD (gastroesophageal reflux disease)   . Hyperglycemia   . Hyperlipidemia   . Hypertension   .  Joint pain   . Knee pain   . Low back pain   . Migraine   . Mitral valve prolapse   . Muscle cramps   . Pulmonary HTN ([name])    mild with PASP 23mmHg by echo 03/2019    Past Surgical History:  Procedure Laterality Date  . BACK SURGERY    . CESAREAN SECTION    . LEFT HEART CATHETERIZATION WITH CORONARY ANGIOGRAM N/A 07/08/2013   Procedure: LEFT HEART CATHETERIZATION WITH CORONARY ANGIOGRAM;  Surgeon: Josue Hector, MD;  Location[facility] CATH LAB;  Service: Cardiovascular;  Laterality: N/A;  . TUBAL LIGATION      Social History:  reports that she quit smoking about 22 years ago. Her smoking use included cigarettes. She has never used smokeless tobacco. She reports current alcohol use. She reports that she does not use drugs.  Allergies: No Known Allergies  Family History:  Family History  Problem Relation Age of Onset  . Hyperlipidemia Mother   . Hypertension Mother   . AAA (abdominal aortic aneurysm) Mother   . Stroke Father   . Hypertension Father   . Hyperlipidemia Brother        x2  . Hypertension Brother        x2  . Liver cancer Maternal Aunt   . Breast cancer Cousin   . Colon cancer Cousin   . Colon polyps Neg Hx   . Esophageal cancer Neg Hx   . Stomach cancer  Neg Hx   . Rectal cancer Neg Hx      Current Outpatient Medications:  .  acetaminophen (TYLENOL) 500 MG tablet, Take 2 tablets (1,000 mg total) by mouth at bedtime., Disp: 30 tablet, Rfl: 0 .  Ascorbic Acid (VITAMIN C) 100 MG tablet, Take 100 mg by mouth daily., Disp: , Rfl:  .  aspirin EC 81 MG tablet, Take 1 tablet (81 mg total) by mouth daily., Disp:  , Rfl:  .  atorvastatin (LIPITOR) 80 MG tablet, Take 1 tablet (80 mg total) by mouth daily., Disp: 90 tablet, Rfl: 0 .  diclofenac sodium (VOLTAREN) 1 % GEL, Apply 2 g topically 4 (four) times daily as needed (pain)., Disp: , Rfl:  .  ELDERBERRY PO, Take by mouth., Disp: , Rfl:  .  furosemide (LASIX) 20 MG tablet, Take 1 tablet (20 mg total) by mouth daily.  You may take 1 extra tablet (20 mg) as needed for increased swelling, Disp: 90 tablet, Rfl: 3 .  gabapentin (NEURONTIN) 300 MG capsule, Take 300-600 mg by mouth at bedtime. , Disp: , Rfl:  .  glucosamine-chondroitin 500-400 MG tablet, Take 1 tablet by mouth daily. , Disp: , Rfl:  .  hydrALAZINE (APRESOLINE) 50 MG tablet, Take 1 tablet (50 mg total) by mouth 3 (three) times daily., Disp: 405 tablet, Rfl: 3 .  Ibuprofen-Famotidine 800-26.6 MG TABS, Take 1 tablet by mouth as directed., Disp: , Rfl:  .  losartan (COZAAR) 100 MG tablet, Take 1 tablet (100 mg total) by mouth daily., Disp: 90 tablet, Rfl: 1 .  magnesium 30 MG tablet, Take 30 mg by mouth 2 (two) times daily., Disp: , Rfl:  .  metoprolol tartrate (LOPRESSOR) 25 MG tablet, Take 1 tablet (25 mg total) by mouth 2 (two) times daily., Disp: 180 tablet, Rfl: 1 .  Multiple Vitamin (MULTIVITAMIN) tablet, Take 1 tablet by mouth daily., Disp: , Rfl:  .  OVER THE COUNTER MEDICATION, Apply 1 application topically daily as needed (pain). Red Oil, Disp: , Rfl:  .  pantoprazole (PROTONIX) 40 MG tablet, Take 1 tablet (40 mg total) by mouth daily., Disp: 30 tablet, Rfl: 11 .  triamcinolone cream (KENALOG) 0.1 %, APPLY 1 APPLICATION TOPICALLY DAILY AS NEEDED., Disp: 45 g, Rfl: 0 .  VITAMIN D PO, Take 1 tablet by mouth daily., Disp: , Rfl:   Review of Systems:  Constitutional: Denies fever, chills, diaphoresis, appetite change and fatigue.  HEENT: Denies photophobia, eye pain, redness, hearing loss, ear pain, congestion, sore throat, rhinorrhea, sneezing, mouth sores, trouble swallowing, neck pain, neck stiffness and tinnitus.   Respiratory: Denies SOB, DOE, cough, chest tightness,  and wheezing.   Cardiovascular: Denies chest pain, palpitations.  Gastrointestinal: Denies nausea, vomiting, abdominal pain, diarrhea, constipation, blood in stool and abdominal distention.  Genitourinary: Denies dysuria, urgency, frequency, hematuria, flank pain and difficulty  urinating.  Endocrine: Denies: hot or cold intolerance, sweats, changes in hair or nails, polyuria, polydipsia. Musculoskeletal: Denies myalgias, back pain, joint swelling, arthralgias and gait problem.  Skin: Denies pallor, rash and wound.  Neurological: Denies  seizures, syncope, weakness,  Numbness. Hematological: Denies adenopathy. Easy bruising, personal or family bleeding history  Psychiatric/Behavioral: Denies suicidal ideation, mood changes, confusion, nervousness, sleep disturbance and agitation    Physical Exam: Vitals:   07/23/19 0723  BP: (!) 150/90  Pulse: 71  Temp: 98.1 F (36.7 C)  TempSrc: Temporal  SpO2: 99%  Weight: 287 lb 11.2 oz (130.5 kg)    Body mass index is 40.7  kg/m.   Constitutional: NAD, calm, comfortable, obese Eyes: PERRL, lids and conjunctivae normal ENMT: Mucous membranes are moist.  Respiratory: clear to auscultation bilaterally, no wheezing, no crackles. Normal respiratory effort. No accessory muscle use.  Cardiovascular: Regular rate and rhythm, no murmurs / rubs / gallops.  2+ pitting lower extremity edema bilaterally. 2+ pedal pulses.  Neurologic: Grossly intact and nonfocal Psychiatric: Normal judgment and insight. Alert and oriented x 3. Normal mood.    Impression and Plan:  Essential hypertension  -Uncontrolled.  Suspect this is what is causing her lightheadedness. -We have discussed low-sodium diet. -She has been taking her hydralazine, metoprolol and Lasix as prescribed, but it would appear she is not taking losartan. -Resume losartan 100 mg daily, she will do ambulatory blood pressure measurements and return in 6 weeks for follow-up.  Hyperlipidemia, unspecified hyperlipidemia type  -Continue high intensity statin (Lipitor 80 mg daily). -Her LDL was 124 in August 2020. -Return lipids when she returns for physical.  Chronic diastolic CHF (congestive heart failure) (HCC) -Compensated. -On beta-blocker, ARB, hydralazine, Lasix,  aspirin, statin.  IGT (impaired glucose tolerance) -A1c is 5.6 in office today.   Patient Instructions  -Nice seeing you today!!  -Make sure you add back losartan 100 mg daily. See your updated medication list. A pill box might be helpful.  -Schedule follow up in 6 weeks.     Lelon Frohlich, MD  [facility]

## 2019-07-23 NOTE — Patient Instructions (Signed)
-  Nice seeing you today!!  -Make sure you add back losartan 100 mg daily. See your updated medication list. A pill box might be helpful.  -Schedule follow up in 6 weeks.

## 2019-07-29 ENCOUNTER — Telehealth: Payer: Self-pay | Admitting: Internal Medicine

## 2019-07-29 DIAGNOSIS — I10 Essential (primary) hypertension: Secondary | ICD-10-CM

## 2019-07-29 DIAGNOSIS — I5032 Chronic diastolic (congestive) heart failure: Secondary | ICD-10-CM

## 2019-07-29 MED ORDER — LOSARTAN POTASSIUM 100 MG PO TABS: 100.0000 mg | ORAL_TABLET | Freq: Every day | ORAL | 1 refills | Status: DC

## 2019-07-29 NOTE — Telephone Encounter (Signed)
Will allow now. Next time will not allow retroactive work notes.

## 2019-07-29 NOTE — Telephone Encounter (Signed)
Pt is calling to let Dr. Jerilee Hoh know that she has had another one of those episodes on yesterday (real lightheaded and dizziness but rested) and this morning but feeling better today (still resting).  Pt thinks it may have been associated with her blood pressure medication, but is not sure.  BP 07/28/2019  165/107 6:11 a.m   162/103 1:21 p.m.   07/29/2019   184/118  6:57 a.m.   163/104  10:30 a.m.   Pt would like to know if you had sent in the blood pressure medication that you wanted her to go back on.  Pt would like to have a call to discuss.

## 2019-07-29 NOTE — Telephone Encounter (Signed)
Spoke with patient and she states the pharmacy did not receive the prescription.  Rx resent.    Patient states she did not go to work yesterday or today because of high blood pressure and dizziness.  She is feeling better today.  She would like to know if she can have a note for work?  Fax [phone]

## 2019-07-29 NOTE — Telephone Encounter (Signed)
As discussed during her visit on 7/22 she was to resume losartan and Rx was sent that day.

## 2019-07-30 NOTE — Telephone Encounter (Signed)
Letter faxed and confirmed

## 2019-08-10 ENCOUNTER — Other Ambulatory Visit: Payer: Self-pay

## 2019-08-10 ENCOUNTER — Ambulatory Visit (HOSPITAL_BASED_OUTPATIENT_CLINIC_OR_DEPARTMENT_OTHER): Payer: 59 | Attending: Cardiology | Admitting: Cardiology

## 2019-08-10 DIAGNOSIS — I272 Pulmonary hypertension, unspecified: Secondary | ICD-10-CM | POA: Diagnosis not present

## 2019-08-10 DIAGNOSIS — G4733 Obstructive sleep apnea (adult) (pediatric): Secondary | ICD-10-CM | POA: Diagnosis not present

## 2019-08-10 DIAGNOSIS — R0902 Hypoxemia: Secondary | ICD-10-CM | POA: Diagnosis not present

## 2019-08-11 NOTE — Procedures (Signed)
   Patient Name: Jamie [name], Jamie [name] Date: 08/10/2019 Gender: Female D.O.B: January 16, 1960 Age (years): 32 Referring Provider: Fransico Him MD, ABSM Height (inches): 70 Interpreting Physician: Fransico Him MD, ABSM Weight (lbs): 279 RPSGT: Jacolyn Reedy BMI: 40 MRN: [id number] Neck Size: 14.00  CLINICAL INFORMATION Sleep Study Type: HST  Indication for sleep study: N/A  Epworth Sleepiness Score: 10  SLEEP STUDY TECHNIQUE A multi-channel overnight portable sleep study was performed. The channels recorded were: nasal airflow, thoracic respiratory movement, and oxygen saturation with a pulse oximetry. Snoring was also monitored.  MEDICATIONS Patient self administered medications include: N/A.  SLEEP ARCHITECTURE Patient was studied for 451.8 minutes. The sleep efficiency was 100.0 % and the patient was supine for 0%. The arousal index was 0.0 per hour.  RESPIRATORY PARAMETERS The overall AHI was 72.8 per hour, with a central apnea index of 0.0 per hour.  The oxygen nadir was 67% during sleep.  CARDIAC DATA Mean heart rate during sleep was 66.7 bpm.  IMPRESSIONS - Severe obstructive sleep apnea occurred during this study (AHI = 72.8/h). - No significant central sleep apnea occurred during this study (CAI = 0.0/h). - Severe oxygen desaturation was noted during this study (Min O2 = 67%). - Patient snored 26.2% during the sleep.  DIAGNOSIS - Obstructive Sleep Apnea (G47.33) - Nocturnal Hypoxemia (G47.36)  RECOMMENDATIONS - Recommend CPAP titration in lab due to severe OSA and nocturnal hypoxemia. - Avoid alcohol, sedatives and other CNS depressants that may worsen sleep apnea and disrupt normal sleep architecture. - Sleep hygiene should be reviewed to assess factors that may improve sleep quality. - Weight management and regular exercise should be initiated or continued.  [Electronically signed] 08/11/2019 01:39 PM  Fransico Him MD, ABSM Diplomate, American Board of  Sleep Medicine

## 2019-08-14 ENCOUNTER — Telehealth: Payer: Self-pay | Admitting: *Deleted

## 2019-08-14 NOTE — Telephone Encounter (Signed)
Informed patient of sleep study results and patient understanding was verbalized. Patient understands her sleep study showed that they have sleep apnea and recommend CPAP titration. Please set up titration in the sleep lab.   Pt is aware and agreeable to her results. Titration sent to sleep pool.

## 2019-08-14 NOTE — Telephone Encounter (Signed)
-----   Message from Sueanne Margarita, MD sent at 08/11/2019  1:41 PM EDT ----- Please let patient know that they have sleep apnea and recommend CPAP titration. Please set up titration in the sleep lab.

## 2019-08-17 ENCOUNTER — Telehealth: Payer: Self-pay | Admitting: *Deleted

## 2019-08-17 NOTE — Telephone Encounter (Signed)
PA submitted to [facility] via web portal for CPAP titration.

## 2019-08-18 NOTE — Telephone Encounter (Addendum)
Stacy from [facility] called in lab denied, no documentation of comorbidities and diagnosis of OSA already established on HST. # [id number] Peer to Peer [phone].or APAP.

## 2019-08-19 ENCOUNTER — Telehealth: Payer: Self-pay | Admitting: *Deleted

## 2019-08-19 NOTE — Telephone Encounter (Signed)
PA for CPAP titration resubmitted to [facility] via web portal per Dr Theodosia Blender request.

## 2019-08-19 NOTE — Telephone Encounter (Signed)
This is for CPAP titration in lab and dx is OSA, pulmonary HTN and CHF should allow for in lab PAP titratoin - resubmit

## 2019-08-27 ENCOUNTER — Telehealth: Payer: Self-pay | Admitting: *Deleted

## 2019-08-27 NOTE — Telephone Encounter (Signed)
Order placed to [facility].  Upon patient request DME selection is [facility]. Patient understands she will be contacted by [facility] to set up her cpap. Patient understands to call if [facility] does not contact her with new setup in a timely manner. Patient understands they will be called once confirmation has been received from [facility] that they have received their new machine to schedule 10 week follow up appointment.  [facility] notified of new cpap order  Please add to airview Patient was grateful for the call and thanked me.

## 2019-08-27 NOTE — Telephone Encounter (Signed)
-----   Message from Lauralee Evener, [place] sent at 08/20/2019 12:42 PM EDT ----- Regarding: RE: precert Secure chat message sent to [facility] informing her [employer] cancelled resubmit. She will have to do peer to peer. Or APAP. ----- Message ----- From: Freada Bergeron, CMA Sent: 08/19/2019   2:31 PM EDT To: [facility] Div Sleep Studies Subject: precert                                        Cpap Titration  This is for CPAP titration in lab and dx is OSA, pulmonary HTN and CHF should allow for in lab PAP titratoin - resubmit

## 2019-08-27 NOTE — Telephone Encounter (Signed)
RE: APAP ORDER Sueanne Margarita, MD  Freada Bergeron, CMA Please order ResMed CPAP on auto from 5 to 20cm H2O with heated humidity and mask of choice and followup with me in 8 weeks

## 2019-09-03 ENCOUNTER — Other Ambulatory Visit: Payer: Self-pay

## 2019-09-04 ENCOUNTER — Other Ambulatory Visit: Payer: Self-pay

## 2019-09-04 ENCOUNTER — Encounter: Payer: Self-pay | Admitting: Internal Medicine

## 2019-09-04 ENCOUNTER — Ambulatory Visit (INDEPENDENT_AMBULATORY_CARE_PROVIDER_SITE_OTHER): Payer: 59 | Admitting: Internal Medicine

## 2019-09-04 VITALS — BP 140/90 | HR 64 | Temp 98.2°F | Wt 283.0 lb

## 2019-09-04 DIAGNOSIS — E7800 Pure hypercholesterolemia, unspecified: Secondary | ICD-10-CM

## 2019-09-04 DIAGNOSIS — Z23 Encounter for immunization: Secondary | ICD-10-CM

## 2019-09-04 DIAGNOSIS — I10 Essential (primary) hypertension: Secondary | ICD-10-CM

## 2019-09-04 DIAGNOSIS — I5032 Chronic diastolic (congestive) heart failure: Secondary | ICD-10-CM

## 2019-09-04 DIAGNOSIS — R7302 Impaired glucose tolerance (oral): Secondary | ICD-10-CM

## 2019-09-04 DIAGNOSIS — Z0000 Encounter for general adult medical examination without abnormal findings: Secondary | ICD-10-CM

## 2019-09-04 DIAGNOSIS — Z0001 Encounter for general adult medical examination with abnormal findings: Secondary | ICD-10-CM | POA: Diagnosis not present

## 2019-09-04 DIAGNOSIS — K219 Gastro-esophageal reflux disease without esophagitis: Secondary | ICD-10-CM

## 2019-09-04 DIAGNOSIS — G4733 Obstructive sleep apnea (adult) (pediatric): Secondary | ICD-10-CM

## 2019-09-04 MED ORDER — HYDRALAZINE HCL 50 MG PO TABS: 75.0000 mg | ORAL_TABLET | Freq: Three times a day (TID) | ORAL | 2 refills | Status: DC

## 2019-09-04 NOTE — Progress Notes (Signed)
Established Patient Office Visit     This visit occurred during the SARS-CoV-2 public health emergency.  Safety protocols were in place, including screening questions prior to the visit, additional usage of staff PPE, and extensive cleaning of exam room while observing appropriate contact time as indicated for disinfecting solutions.    CC/Reason for Visit: Annual preventive exam, follow-up blood pressure  HPI: Jamie [name] is a 59 y.o. female who is coming in today for the above mentioned reasons. Past Medical History is significant for: Uncontrolled hypertension, new diagnosis of diastolic congestive heart failure, morbid obesity, hyperlipidemia and impaired glucose tolerance.  She recently had a sleep study and was told she had OSA, has not yet had her CPAP delivered.  She has routine eye and dental care.  She saw her GYN last year we did her mammogram and Pap smear in December, she had a colonoscopy in 2019, she is due for flu and shingles vaccine.  Her blood pressure remains elevated.  She has share some of her blood pressure numbers with me and they are in the range of 150-170/90-100.  She has no acute complaints today.   Past Medical/Surgical History: Past Medical History:  Diagnosis Date  . Acquired dilation of ascending aorta and aortic root ([place])    74m by 2D echo 03/2019  . Allergy   . Arthritis    in back  . Chest pain    neg cath 2016 after false positive myoview  . Depression   . GERD (gastroesophageal reflux disease)   . Hyperglycemia   . Hyperlipidemia   . Hypertension   . Joint pain   . Knee pain   . Low back pain   . Migraine   . Mitral valve prolapse   . Muscle cramps   . Pulmonary HTN ([place]    mild with PASP 345mg by echo 03/2019    Past Surgical History:  Procedure Laterality Date  . BACK SURGERY    . CESAREAN SECTION    . LEFT HEART CATHETERIZATION WITH CORONARY ANGIOGRAM N/A 07/08/2013   Procedure: LEFT HEART CATHETERIZATION WITH CORONARY  ANGIOGRAM;  Surgeon: PeJosue HectorMD;  [facility];  Service: Cardiovascular;  Laterality: N/A;  . TUBAL LIGATION      Social History:  reports that she quit smoking about 23 years ago. Her smoking use included cigarettes. She has never used smokeless tobacco. She reports current alcohol use. She reports that she does not use drugs.  Allergies: No Known Allergies  Family History:  Family History  Problem Relation Age of Onset  . Hyperlipidemia Mother   . Hypertension Mother   . AAA (abdominal aortic aneurysm) Mother   . Stroke Father   . Hypertension Father   . Hyperlipidemia Brother        x2  . Hypertension Brother        x2  . Liver cancer Maternal Aunt   . Breast cancer Cousin   . Colon cancer Cousin   . Colon polyps Neg Hx   . Esophageal cancer Neg Hx   . Stomach cancer Neg Hx   . Rectal cancer Neg Hx      Current Outpatient Medications:  .  acetaminophen (TYLENOL) 500 MG tablet, Take 2 tablets (1,000 mg total) by mouth at bedtime., Disp: 30 tablet, Rfl: 0 .  Ascorbic Acid (VITAMIN C) 100 MG tablet, Take 100 mg by mouth daily., Disp: , Rfl:  .  aspirin EC 81 MG tablet,  Take 1 tablet (81 mg total) by mouth daily., Disp:  , Rfl:  .  atorvastatin (LIPITOR) 80 MG tablet, Take 1 tablet (80 mg total) by mouth daily., Disp: 90 tablet, Rfl: 0 .  diclofenac sodium (VOLTAREN) 1 % GEL, Apply 2 g topically 4 (four) times daily as needed (pain)., Disp: , Rfl:  .  ELDERBERRY PO, Take by mouth., Disp: , Rfl:  .  furosemide (LASIX) 20 MG tablet, Take 1 tablet (20 mg total) by mouth daily. You may take 1 extra tablet (20 mg) as needed for increased swelling, Disp: 90 tablet, Rfl: 3 .  gabapentin (NEURONTIN) 300 MG capsule, Take 300-600 mg by mouth at bedtime. , Disp: , Rfl:  .  glucosamine-chondroitin 500-400 MG tablet, Take 1 tablet by mouth daily. , Disp: , Rfl:  .  hydrALAZINE (APRESOLINE) 50 MG tablet, Take 1.5 tablets (75 mg total) by mouth 3 (three) times daily.,  Disp: 135 tablet, Rfl: 2 .  Ibuprofen-Famotidine 800-26.6 MG TABS, Take 1 tablet by mouth as directed., Disp: , Rfl:  .  losartan (COZAAR) 100 MG tablet, Take 1 tablet (100 mg total) by mouth daily., Disp: 90 tablet, Rfl: 1 .  magnesium 30 MG tablet, Take 30 mg by mouth 2 (two) times daily., Disp: , Rfl:  .  metoprolol tartrate (LOPRESSOR) 25 MG tablet, Take 1 tablet (25 mg total) by mouth 2 (two) times daily., Disp: 180 tablet, Rfl: 1 .  Multiple Vitamin (MULTIVITAMIN) tablet, Take 1 tablet by mouth daily., Disp: , Rfl:  .  OVER THE COUNTER MEDICATION, Apply 1 application topically daily as needed (pain). Red Oil, Disp: , Rfl:  .  pantoprazole (PROTONIX) 40 MG tablet, Take 1 tablet (40 mg total) by mouth daily., Disp: 30 tablet, Rfl: 11 .  triamcinolone cream (KENALOG) 0.1 %, APPLY 1 APPLICATION TOPICALLY DAILY AS NEEDED., Disp: 45 g, Rfl: 0 .  VITAMIN D PO, Take 1 tablet by mouth daily., Disp: , Rfl:   Review of Systems:  Constitutional: Denies fever, chills, diaphoresis, appetite change and fatigue.  HEENT: Denies photophobia, eye pain, redness, hearing loss, ear pain, congestion, sore throat, rhinorrhea, sneezing, mouth sores, trouble swallowing, neck pain, neck stiffness and tinnitus.   Respiratory: Denies SOB, DOE, cough, chest tightness,  and wheezing.   Cardiovascular: Denies chest pain, palpitations and leg swelling.  Gastrointestinal: Denies nausea, vomiting, abdominal pain, diarrhea, constipation, blood in stool and abdominal distention.  Genitourinary: Denies dysuria, urgency, frequency, hematuria, flank pain and difficulty urinating.  Endocrine: Denies: hot or cold intolerance, sweats, changes in hair or nails, polyuria, polydipsia. Musculoskeletal: Denies myalgias, back pain, joint swelling, arthralgias and gait problem.  Skin: Denies pallor, rash and wound.  Neurological: Denies dizziness, seizures, syncope, weakness, light-headedness, numbness and headaches.  Hematological:  Denies adenopathy. Easy bruising, personal or family bleeding history  Psychiatric/Behavioral: Denies suicidal ideation, mood changes, confusion, nervousness, sleep disturbance and agitation    Physical Exam: Vitals:   09/04/19 0700  BP: 140/90  Pulse: 64  Temp: 98.2 F (36.8 C)  TempSrc: Oral  SpO2: 99%  Weight: 283 lb (128.4 kg)    Body mass index is 40.61 kg/m.   Constitutional: NAD, calm, comfortable Eyes: PERRL, lids and conjunctivae normal ENMT: Mucous membranes are moist.  Tympanic membrane is pearly white, no erythema or bulging. Neck: normal, supple, no masses, no thyromegaly Respiratory: clear to auscultation bilaterally, no wheezing, no crackles. Normal respiratory effort. No accessory muscle use.  Cardiovascular: Regular rate and rhythm, no murmurs / rubs / gallops.  1+ bilateral lower extremity edema. 2+ pedal pulses. No carotid bruits.  Abdomen: no tenderness, no masses palpated. No hepatosplenomegaly. Bowel sounds positive.  Musculoskeletal: no clubbing / cyanosis. No joint deformity upper and lower extremities. Good ROM, no contractures. Normal muscle tone.  Skin: no rashes, lesions, ulcers. No induration Neurologic: CN 2-12 grossly intact. Sensation intact, DTR normal. Strength 5/5 in all 4.  Psychiatric: Normal judgment and insight. Alert and oriented x 3. Normal mood.    Impression and Plan:  Encounter for preventive health examination  -She has routine eye and dental care. -Due for flu and first shingles vaccine today, otherwise immunizations are up-to-date including Covid. -Screening labs today. -Healthy lifestyle discussed in detail. -Pap smear in December 2020. -Mammogram in December 2020. -She had a colonoscopy in 2019 and is a 3-year callback due to her history of polyps.  Chronic diastolic CHF (congestive heart failure) ([place])  -She has low-grade lower extremity edema but no shortness of breath,.  She is on ARB, statin, Lasix, metoprolol,  aspirin.  IGT (impaired glucose tolerance)  - Plan: Hemoglobin A1c -Last A1c was 5.6 in July.  Gastroesophageal reflux disease, unspecified whether esophagitis present -Well-controlled on daily PPI therapy.  Essential hypertension  -Remains uncontrolled. -Increase hydralazine to 75 mg 3 times a day, continue Lasix 20 mg, metoprolol 25 mg twice daily, losartan 100 and now hydralazine 75 mg 3 times a day  Pure hypercholesterolemia  - Plan: Lipid panel  OSA (obstructive sleep apnea) -New diagnosis, has not yet received her CPAP machine.    Patient Instructions  -Nice seeing you today!!  -Lab work today; will notify you once results are available.  -Flu and first shingles vaccines today.  -Increase hydralazine to 75 mg (1 and a half tablets) 3 times a day.  -Schedule follow up in 8 weeks.   Preventive Care 39-60 Years Old, Female Preventive care refers to visits with your health care provider and lifestyle choices that can promote health and wellness. This includes:  A yearly physical exam. This may also be called an annual well check.  Regular dental visits and eye exams.  Immunizations.  Screening for certain conditions.  Healthy lifestyle choices, such as eating a healthy diet, getting regular exercise, not using drugs or products that contain nicotine and tobacco, and limiting alcohol use. What can I expect for my preventive care visit? Physical exam Your health care provider will check your:  Height and weight. This may be used to calculate body mass index (BMI), which tells if you are at a healthy weight.  Heart rate and blood pressure.  Skin for abnormal spots. Counseling Your health care provider may ask you questions about your:  Alcohol, tobacco, and drug use.  Emotional well-being.  Home and relationship well-being.  Sexual activity.  Eating habits.  Work and work Statistician.  Method of birth control.  Menstrual cycle.  Pregnancy  history. What immunizations do I need?  Influenza (flu) vaccine  This is recommended every year. Tetanus, diphtheria, and pertussis (Tdap) vaccine  You may need a Td booster every 10 years. Varicella (chickenpox) vaccine  You may need this if you have not been vaccinated. Zoster (shingles) vaccine  You may need this after age 4. Measles, mumps, and rubella (MMR) vaccine  You may need at least one dose of MMR if you were born in 1957 or later. You may also need a second dose. Pneumococcal conjugate (PCV13) vaccine  You may need this if you have certain conditions and were not  previously vaccinated. Pneumococcal polysaccharide (PPSV23) vaccine  You may need one or two doses if you smoke cigarettes or if you have certain conditions. Meningococcal conjugate (MenACWY) vaccine  You may need this if you have certain conditions. Hepatitis A vaccine  You may need this if you have certain conditions or if you travel or work in places where you may be exposed to hepatitis A. Hepatitis B vaccine  You may need this if you have certain conditions or if you travel or work in places where you may be exposed to hepatitis B. Haemophilus influenzae type b (Hib) vaccine  You may need this if you have certain conditions. Human papillomavirus (HPV) vaccine  If recommended by your health care provider, you may need three doses over 6 months. You may receive vaccines as individual doses or as more than one vaccine together in one shot (combination vaccines). Talk with your health care provider about the risks and benefits of combination vaccines. What tests do I need? Blood tests  Lipid and cholesterol levels. These may be checked every 5 years, or more frequently if you are over 75 years old.  Hepatitis C test.  Hepatitis B test. Screening  Lung cancer screening. You may have this screening every year starting at age 56 if you have a 30-pack-year history of smoking and currently smoke or  have quit within the past 15 years.  Colorectal cancer screening. All adults should have this screening starting at age 23 and continuing until age 84. Your health care provider may recommend screening at age 61 if you are at increased risk. You will have tests every 1-10 years, depending on your results and the type of screening test.  Diabetes screening. This is done by checking your blood sugar (glucose) after you have not eaten for a while (fasting). You may have this done every 1-3 years.  Mammogram. This may be done every 1-2 years. Talk with your health care provider about when you should start having regular mammograms. This may depend on whether you have a family history of breast cancer.  BRCA-related cancer screening. This may be done if you have a family history of breast, ovarian, tubal, or peritoneal cancers.  Pelvic exam and Pap test. This may be done every 3 years starting at age 25. Starting at age 3, this may be done every 5 years if you have a Pap test in combination with an HPV test. Other tests  Sexually transmitted disease (STD) testing.  Bone density scan. This is done to screen for osteoporosis. You may have this scan if you are at high risk for osteoporosis. Follow these instructions at home: Eating and drinking  Eat a diet that includes fresh fruits and vegetables, whole grains, lean protein, and low-fat dairy.  Take vitamin and mineral supplements as recommended by your health care provider.  Do not drink alcohol if: ? Your health care provider tells you not to drink. ? You are pregnant, may be pregnant, or are planning to become pregnant.  If you drink alcohol: ? Limit how much you have to 0-1 drink a day. ? Be aware of how much alcohol is in your drink. In the [place] one drink equals one 12 oz bottle of beer (355 mL), one 5 oz glass of wine (148 mL), or one 1 oz glass of hard liquor (44 mL). Lifestyle  Take daily care of your teeth and gums.  Stay  active. Exercise for at least 30 minutes on 5 or more days each  week.  Do not use any products that contain nicotine or tobacco, such as cigarettes, e-cigarettes, and chewing tobacco. If you need help quitting, ask your health care provider.  If you are sexually active, practice safe sex. Use a condom or other form of birth control (contraception) in order to prevent pregnancy and STIs (sexually transmitted infections).  If told by your health care provider, take low-dose aspirin daily starting at age 54. What's next?  Visit your health care provider once a year for a well check visit.  Ask your health care provider how often you should have your eyes and teeth checked.  Stay up to date on all vaccines. This information is not intended to replace advice given to you by your health care provider. Make sure you discuss any questions you have with your health care provider. Document Revised: 08/29/2017 Document Reviewed: 08/29/2017 Elsevier Patient Education  2020 [place], MD [facility]

## 2019-09-04 NOTE — Patient Instructions (Signed)
-Nice seeing you today!!  -Lab work today; will notify you once results are available.  -Flu and first shingles vaccines today.  -Increase hydralazine to 75 mg (1 and a half tablets) 3 times a day.  -Schedule follow up in 8 weeks.   Preventive Care 65-57 Years Old, Female Preventive care refers to visits with your health care provider and lifestyle choices that can promote health and wellness. This includes:  A yearly physical exam. This may also be called an annual well check.  Regular dental visits and eye exams.  Immunizations.  Screening for certain conditions.  Healthy lifestyle choices, such as eating a healthy diet, getting regular exercise, not using drugs or products that contain nicotine and tobacco, and limiting alcohol use. What can I expect for my preventive care visit? Physical exam Your health care provider will check your:  Height and weight. This may be used to calculate body mass index (BMI), which tells if you are at a healthy weight.  Heart rate and blood pressure.  Skin for abnormal spots. Counseling Your health care provider may ask you questions about your:  Alcohol, tobacco, and drug use.  Emotional well-being.  Home and relationship well-being.  Sexual activity.  Eating habits.  Work and work Statistician.  Method of birth control.  Menstrual cycle.  Pregnancy history. What immunizations do I need?  Influenza (flu) vaccine  This is recommended every year. Tetanus, diphtheria, and pertussis (Tdap) vaccine  You may need a Td booster every 10 years. Varicella (chickenpox) vaccine  You may need this if you have not been vaccinated. Zoster (shingles) vaccine  You may need this after age 57. Measles, mumps, and rubella (MMR) vaccine  You may need at least one dose of MMR if you were born in 1957 or later. You may also need a second dose. Pneumococcal conjugate (PCV13) vaccine  You may need this if you have certain conditions and  were not previously vaccinated. Pneumococcal polysaccharide (PPSV23) vaccine  You may need one or two doses if you smoke cigarettes or if you have certain conditions. Meningococcal conjugate (MenACWY) vaccine  You may need this if you have certain conditions. Hepatitis A vaccine  You may need this if you have certain conditions or if you travel or work in places where you may be exposed to hepatitis A. Hepatitis B vaccine  You may need this if you have certain conditions or if you travel or work in places where you may be exposed to hepatitis B. Haemophilus influenzae type b (Hib) vaccine  You may need this if you have certain conditions. Human papillomavirus (HPV) vaccine  If recommended by your health care provider, you may need three doses over 6 months. You may receive vaccines as individual doses or as more than one vaccine together in one shot (combination vaccines). Talk with your health care provider about the risks and benefits of combination vaccines. What tests do I need? Blood tests  Lipid and cholesterol levels. These may be checked every 5 years, or more frequently if you are over 74 years old.  Hepatitis C test.  Hepatitis B test. Screening  Lung cancer screening. You may have this screening every year starting at age 57 if you have a 30-pack-year history of smoking and currently smoke or have quit within the past 15 years.  Colorectal cancer screening. All adults should have this screening starting at age 57 and continuing until age 57. Your health care provider may recommend screening at age 57 if you  are at increased risk. You will have tests every 1-10 years, depending on your results and the type of screening test.  Diabetes screening. This is done by checking your blood sugar (glucose) after you have not eaten for a while (fasting). You may have this done every 1-3 years.  Mammogram. This may be done every 1-2 years. Talk with your health care provider about  when you should start having regular mammograms. This may depend on whether you have a family history of breast cancer.  BRCA-related cancer screening. This may be done if you have a family history of breast, ovarian, tubal, or peritoneal cancers.  Pelvic exam and Pap test. This may be done every 3 years starting at age 57. Starting at age 15, this may be done every 5 years if you have a Pap test in combination with an HPV test. Other tests  Sexually transmitted disease (STD) testing.  Bone density scan. This is done to screen for osteoporosis. You may have this scan if you are at high risk for osteoporosis. Follow these instructions at home: Eating and drinking  Eat a diet that includes fresh fruits and vegetables, whole grains, lean protein, and low-fat dairy.  Take vitamin and mineral supplements as recommended by your health care provider.  Do not drink alcohol if: ? Your health care provider tells you not to drink. ? You are pregnant, may be pregnant, or are planning to become pregnant.  If you drink alcohol: ? Limit how much you have to 0-1 drink a day. ? Be aware of how much alcohol is in your drink. In the [place] one drink equals one 12 oz bottle of beer (355 mL), one 5 oz glass of wine (148 mL), or one 1 oz glass of hard liquor (44 mL). Lifestyle  Take daily care of your teeth and gums.  Stay active. Exercise for at least 30 minutes on 5 or more days each week.  Do not use any products that contain nicotine or tobacco, such as cigarettes, e-cigarettes, and chewing tobacco. If you need help quitting, ask your health care provider.  If you are sexually active, practice safe sex. Use a condom or other form of birth control (contraception) in order to prevent pregnancy and STIs (sexually transmitted infections).  If told by your health care provider, take low-dose aspirin daily starting at age 57. What's next?  Visit your health care provider once a year for a well check  visit.  Ask your health care provider how often you should have your eyes and teeth checked.  Stay up to date on all vaccines. This information is not intended to replace advice given to you by your health care provider. Make sure you discuss any questions you have with your health care provider. Document Revised: 08/29/2017 Document Reviewed: 08/29/2017 Elsevier Patient Education  2020 [employer].

## 2019-09-04 NOTE — Addendum Note (Signed)
Addended by: Westley Hummer B on: 09/04/2019 01:13 PM   Modules accepted: Orders

## 2019-09-05 LAB — LIPID PANEL
Cholesterol: 116 mg/dL (ref <200)
HDL: 54 mg/dL (ref >=50)
LDL Cholesterol (Calc): 47 mg/dL (calc)
Non-HDL Cholesterol (Calc): 62 mg/dL (calc) (ref <130)
Total CHOL/HDL Ratio: 2.1 (calc) (ref <5.0)
Triglycerides: 74 mg/dL (ref <150)

## 2019-09-05 LAB — COMPREHENSIVE METABOLIC PANEL
AG Ratio: 1.7 (calc) (ref 1.0–2.5)
ALT: 21 U/L (ref 6–29)
AST: 17 U/L (ref 10–35)
Albumin: 4.3 g/dL (ref 3.6–5.1)
Alkaline phosphatase (APISO): 65 U/L (ref 37–153)
BUN: 10 mg/dL (ref 7–25)
CO2: 28 mmol/L (ref 20–32)
Calcium: 9.3 mg/dL (ref 8.6–10.4)
Chloride: 107 mmol/L (ref 98–110)
Creat: 0.9 mg/dL (ref 0.50–1.05)
Globulin: 2.5 g/dL (calc) (ref 1.9–3.7)
Glucose, Bld: 98 mg/dL (ref 65–99)
Potassium: 4.1 mmol/L (ref 3.5–5.3)
Sodium: 144 mmol/L (ref 135–146)
Total Bilirubin: 0.6 mg/dL (ref 0.2–1.2)
Total Protein: 6.8 g/dL (ref 6.1–8.1)

## 2019-09-05 LAB — CBC WITH DIFFERENTIAL/PLATELET
Absolute Monocytes: 281 cells/uL (ref 200–950)
Basophils Absolute: 18 cells/uL (ref 0–200)
Basophils Relative: 0.4 %
Eosinophils Absolute: 212 cells/uL (ref 15–500)
Eosinophils Relative: 4.6 %
HCT: 38.2 % (ref 35.0–45.0)
Hemoglobin: 12.3 g/dL (ref 11.7–15.5)
Lymphs Abs: 1730 cells/uL (ref 850–3900)
MCH: 29.3 pg (ref 27.0–33.0)
MCHC: 32.2 g/dL (ref 32.0–36.0)
MCV: 91 fL (ref 80.0–100.0)
MPV: 10.2 fL (ref 7.5–12.5)
Monocytes Relative: 6.1 %
Neutro Abs: 2360 cells/uL (ref 1500–7800)
Neutrophils Relative %: 51.3 %
Platelets: 238 10*3/uL (ref 140–400)
RBC: 4.2 10*6/uL (ref 3.80–5.10)
RDW: 14.3 % (ref 11.0–15.0)
Total Lymphocyte: 37.6 %
WBC: 4.6 10*3/uL (ref 3.8–10.8)

## 2019-09-05 LAB — HEMOGLOBIN A1C
Hgb A1c MFr Bld: 5.8 % of total Hgb — ABNORMAL HIGH (ref <5.7)
Mean Plasma Glucose: 120 (calc)
eAG (mmol/L): 6.6 (calc)

## 2019-09-05 LAB — TSH: TSH: 1.78 mIU/L (ref 0.40–4.50)

## 2019-09-05 LAB — VITAMIN B12: Vitamin B-12: 667 pg/mL (ref 200–1100)

## 2019-09-05 LAB — VITAMIN D 25 HYDROXY (VIT D DEFICIENCY, FRACTURES): Vit D, 25-Hydroxy: 41 ng/mL (ref 30–100)

## 2019-10-09 NOTE — Telephone Encounter (Signed)
Patient has a 10 week follow up appointment scheduled for 11/24/19. Patient understands she needs to keep this appointment for insurance compliance. Patient was grateful for the call and thanked me.

## 2019-10-29 ENCOUNTER — Other Ambulatory Visit: Payer: Self-pay | Admitting: Internal Medicine

## 2019-10-29 DIAGNOSIS — Z1231 Encounter for screening mammogram for malignant neoplasm of breast: Secondary | ICD-10-CM

## 2019-11-24 ENCOUNTER — Encounter: Payer: Self-pay | Admitting: Cardiology

## 2019-11-24 ENCOUNTER — Telehealth (INDEPENDENT_AMBULATORY_CARE_PROVIDER_SITE_OTHER): Payer: 59 | Admitting: Cardiology

## 2019-11-24 ENCOUNTER — Other Ambulatory Visit: Payer: Self-pay

## 2019-11-24 VITALS — BP 160/97 | HR 68 | Ht 70.5 in | Wt 290.0 lb

## 2019-11-24 DIAGNOSIS — I272 Pulmonary hypertension, unspecified: Secondary | ICD-10-CM

## 2019-11-24 DIAGNOSIS — R002 Palpitations: Secondary | ICD-10-CM

## 2019-11-24 DIAGNOSIS — R0602 Shortness of breath: Secondary | ICD-10-CM

## 2019-11-24 DIAGNOSIS — R072 Precordial pain: Secondary | ICD-10-CM

## 2019-11-24 DIAGNOSIS — I5032 Chronic diastolic (congestive) heart failure: Secondary | ICD-10-CM | POA: Diagnosis not present

## 2019-11-24 DIAGNOSIS — G4733 Obstructive sleep apnea (adult) (pediatric): Secondary | ICD-10-CM

## 2019-11-24 DIAGNOSIS — E7800 Pure hypercholesterolemia, unspecified: Secondary | ICD-10-CM

## 2019-11-24 DIAGNOSIS — I10 Essential (primary) hypertension: Secondary | ICD-10-CM

## 2019-11-24 MED ORDER — HYDRALAZINE HCL 100 MG PO TABS: 100.0000 mg | ORAL_TABLET | Freq: Three times a day (TID) | ORAL | 3 refills | Status: DC

## 2019-11-24 NOTE — Patient Instructions (Addendum)
Please take your blood pressure daily for the next week and send [place] a MyChart message or call with a list of your readings.   Medication Instructions:  Your physician has recommended you make the following change in your medication: 1) INCREASE hydralazine to 100 mg three times per day.  *If you need a refill on your cardiac medications before your next appointment, please call your pharmacy*  Testing/Procedures: Your physician has requested that you have a lexiscan myoview. For further information please visit [email or web address]. Please follow instruction sheet, as given.  Follow-Up: At [facility], you and your health needs are our priority.  As part of our continuing mission to provide you with exceptional heart care, we have created designated Provider Care Teams.  These Care Teams include your primary Cardiologist (physician) and Advanced Practice Providers (APPs -  Physician Assistants and Nurse Practitioners) who all work together to provide you with the care you need, when you need it.  Your next appointment:   3 month(s)  The format for your next appointment:   In Person  Provider:   Fransico Him, MD

## 2019-11-24 NOTE — Addendum Note (Signed)
Addended by: Antonieta Iba on: 11/24/2019 10:34 AM   Modules accepted: Orders

## 2019-11-24 NOTE — Progress Notes (Signed)
Virtual Visit via Telephone Note   This visit type was conducted due to national recommendations for restrictions regarding the COVID-19 Pandemic (e.g. social distancing) in an effort to limit this patient's exposure and mitigate transmission in our community.  Due to her co-morbid illnesses, this patient is at least at moderate risk for complications without adequate follow up.  This format is felt to be most appropriate for this patient at this time.  The patient did not have access to video technology/had technical difficulties with video requiring transitioning to audio format only (telephone).  All issues noted in this document were discussed and addressed.  No physical exam could be performed with this format.  Please refer to the patient's chart for her  consent to telehealth for [facility].   Evaluation Performed:  Follow-up visit  This visit type was conducted due to national recommendations for restrictions regarding the COVID-19 Pandemic (e.g. social distancing).  This format is felt to be most appropriate for this patient at this time.  All issues noted in this document were discussed and addressed.  No physical exam was performed (except for noted visual exam findings with Video Visits).  Please refer to the patient's chart (MyChart message for video visits and phone note for telephone visits) for the patient's consent to telehealth for [facility].  Date:  11/24/2019   ID:  Jamie [name], DOB Feb 10, 1960, MRN [id number]  Patient Location:  Home  Provider location:   [place]  PCP:  Isaac Bliss, Rayford Halsted, MD  Cardiologist:  Fransico Him, MD  Electrophysiologist:  None   Chief Complaint:  Chest pain and SOB  History of Present Illness:    Jamie [name] is a 59 y.o. female who presents via audio/video conferencing for a telehealth visit today.    This is a 59yo AAF with a hx of CP in the past with false positive myoview and normal coronary arteries in  2016.  She has a hx of GERD, HTN, HLD and MVP by remote echo.  She saw me in Jan 2021 with recurrent CP and DOE.  2D echo Aug 2020 was normal with G1DD.  CP was atypical (sharp and stabbing) and belching improved her sx but sometimes would radiate into her left arm with tingling.  She also complained of some palpitations.  She underwent coronary CTA showing a Ca++ score of 0 and no CAD.  Repeat 2D echo showed normal LVF with G2DD and mild pulmonary HTN with PASP 28mmHg and mildly dilated ascending aorta at 57mm.   Pro-BNP was normal.    When I saw her last a home sleep study was ordered showing severe OSA with an AHI of 72/hr and was started on auto CPAP.  She is now here for followup.  She is not very happy with her device.  She uses a FFM which makes her teeth hurt.  She has not been using it for a few weeks due to this.  Before she stopped using her device she did not really notice any difference in her sleep. Since going on CPAP she feels rested in the am and has no significant daytime sleepiness.  She denies any significant mouth or nasal dryness or nasal congestion.  She does not think that he snores.    She is here today for followup and is doing well.  She has chronic DOE and LE edema that are stable.  She denies any PND, orthopnea or syncope. She continues to have intermittent palpitations and dizziness.  She still has some chest discomfort associated with left arm numbness at times.  She is compliant with her meds and is tolerating meds with no SE.    Prior CV studies:   The following studies were reviewed today:  2D echo, sleep study, PAP compliance download  Past Medical History:  Diagnosis Date  . Acquired dilation of ascending aorta and aortic root ([place])    41mm by 2D echo 03/2019  . Allergy   . Arthritis    in back  . Chest pain    neg cath 2016 after false positive myoview  . Depression   . GERD (gastroesophageal reflux disease)   . Hyperglycemia   . Hyperlipidemia   .  Hypertension   . Joint pain   . Knee pain   . Low back pain   . Migraine   . Mitral valve prolapse   . Muscle cramps   . Pulmonary HTN ([place])    mild with PASP 70mmHg by echo 03/2019   Past Surgical History:  Procedure Laterality Date  . BACK SURGERY    . CESAREAN SECTION    . LEFT HEART CATHETERIZATION WITH CORONARY ANGIOGRAM N/A 07/08/2013   Procedure: LEFT HEART CATHETERIZATION WITH CORONARY ANGIOGRAM;  Surgeon: Josue Hector, MD;  Location[facility];  Service: Cardiovascular;  Laterality: N/A;  . TUBAL LIGATION       Current Meds  Medication Sig  . acetaminophen (TYLENOL) 500 MG tablet Take 2 tablets (1,000 mg total) by mouth at bedtime.  . Ascorbic Acid (VITAMIN C) 100 MG tablet Take 100 mg by mouth daily.  Marland Kitchen aspirin EC 81 MG tablet Take 1 tablet (81 mg total) by mouth daily.  Marland Kitchen atorvastatin (LIPITOR) 80 MG tablet Take 1 tablet (80 mg total) by mouth daily.  . diclofenac sodium (VOLTAREN) 1 % GEL Apply 2 g topically 4 (four) times daily as needed (pain).  Marland Kitchen ELDERBERRY PO Take by mouth.  . furosemide (LASIX) 20 MG tablet Take 1 tablet (20 mg total) by mouth daily. You may take 1 extra tablet (20 mg) as needed for increased swelling  . gabapentin (NEURONTIN) 300 MG capsule Take 300-600 mg by mouth at bedtime.   . hydrALAZINE (APRESOLINE) 50 MG tablet Take 1.5 tablets (75 mg total) by mouth 3 (three) times daily.  Marland Kitchen losartan (COZAAR) 100 MG tablet Take 1 tablet (100 mg total) by mouth daily.  . magnesium 30 MG tablet Take 30 mg by mouth 2 (two) times daily.  . metoprolol tartrate (LOPRESSOR) 25 MG tablet Take 1 tablet (25 mg total) by mouth 2 (two) times daily.  . pantoprazole (PROTONIX) 40 MG tablet Take 1 tablet (40 mg total) by mouth daily.  Marland Kitchen triamcinolone cream (KENALOG) 0.1 % APPLY 1 APPLICATION TOPICALLY DAILY AS NEEDED.  Marland Kitchen VITAMIN D PO Take 1 tablet by mouth daily.     Allergies:   Patient has no known allergies.   Social History   Tobacco Use  . Smoking status:  Former Smoker    Types: Cigarettes    Quit date: 07/31/1996    Years since quitting: 23.3  . Smokeless tobacco: Never Used  Vaping Use  . Vaping Use: Never used  Substance Use Topics  . Alcohol use: Yes    Comment: very little  . Drug use: No     Family Hx: The patient's family history includes AAA (abdominal aortic aneurysm) in her mother; Breast cancer in her cousin; Colon cancer in her cousin; Hyperlipidemia in her brother  and mother; Hypertension in her brother, father, and mother; Liver cancer in her maternal aunt; Stroke in her father. There is no history of Colon polyps, Esophageal cancer, Stomach cancer, or Rectal cancer.  ROS:   Please see the history of present illness.     All other systems reviewed and are negative.   Labs/Other Tests and Data Reviewed:    Recent Labs: 03/10/2019: NT-Pro BNP 130 09/04/2019: ALT 21; BUN 10; Creat 0.90; Hemoglobin 12.3; Platelets 238; Potassium 4.1; Sodium 144; TSH 1.78   Recent Lipid Panel Lab Results  Component Value Date/Time   CHOL 116 09/04/2019 08:03 AM   CHOL 193 10/01/2017 11:50 AM   TRIG 74 09/04/2019 08:03 AM   HDL 54 09/04/2019 08:03 AM   HDL 51 10/01/2017 11:50 AM   CHOLHDL 2.1 09/04/2019 08:03 AM   LDLCALC 47 09/04/2019 08:03 AM    Wt Readings from Last 3 Encounters:  11/24/19 290 lb (131.5 kg)  09/04/19 283 lb (128.4 kg)  08/10/19 278 lb (126.1 kg)     Objective:    Vital Signs:  BP (!) 160/97   Pulse 68   Ht 5' 10.5" (1.791 m)   Wt 290 lb (131.5 kg)   LMP 10/04/2014   BMI 41.02 kg/m     ASSESSMENT & PLAN:    1. Chest pain -atypical in description  -she has had a few episodes of chest discomfort along with numbness in her left arm -she does have CRFs including obesity, HLD and HTN as well as remote tobacco use -EKG nonischemic -Coronary CTA showed no CAD and Ca score 0 but was not a good quality study for non calcified plaque -since she continues to have intermittent chest discomfort I will get a  Lexiscan myoview  2.  SOB -SOB is stable with no change -suspect related to obesity and diastolic dysfunction as well as dietary indiscretion with Na -2D echo showed normal LVF with mild PHTN and G2DD -proBNP was normal  3.  Chronic diastolic CHF -she has continues to have chronic LE edema and hand edema that intermittently increases likely related to poor compliance with low Na diet -I encouraged her to avoid any added salt and follow a strict <2gm Na diet -continue Lasix 20mg  daily and take an extra dose when needed for increased edema  4.  HTN -Bp poorly controlled and likely being driven by poor compliance with low Na diet -continue Losartan 100mg  daily, Lopressor 25mg  BID -increase Hydralazine to 100mg  TID and check Bp and HR daily for a week and call with results  5.  HLD -continue Lipitor 80mg  daily -followed by PCP  6.  Palpitations -Event monitor was normal -still intermittently has some palpitations and did not have any while on heart monitor -she got a [employer] device and says at time it will say irregular -I encouraged her to send me the strips to review  7.  Pulmonary HTN -mildly elevated and likely related to G2DD  -continue diuretics -repeat echo 03/2020 -home sleep study showed severe OSA  8.  OSA - The pathophysiology of obstructive sleep apnea , it's cardiovascular consequences & modes of treatment including CPAP were discused with the patient in detail & they evidenced understanding.  The patient is tolerating PAP therapy but it makes her teeth hurt.  The PAP download was reviewed today and showed an AHI of 0.3/hr on 10 cm H2O with 93% compliance in using more than 4 hours nightly.  The patient has been using and benefiting  from PAP use and will continue to benefit from therapy.  -unfortunately she stopped using her device due to her teeth hurting -we discussed the medical problems that can occur with untreated OSA and she wants to start using it  again -I will order her a nasal mask with chin strap to see if that is more comfortable.  -Hopefully using a chin strap will help with mouth dryness   Time:   Today, I have spent 20 minutes on telemedicine discussing medical problems including CP, SOB, CHF, palpitations, OSA and reviewing patient's chart including  2D echo, sleep study and PAP compliance download  Medication Adjustments/Labs and Tests Ordered: Current medicines are reviewed at length with the patient today.  Concerns regarding medicines are outlined above.  Tests Ordered: No orders of the defined types were placed in this encounter.  Medication Changes: No orders of the defined types were placed in this encounter.   Disposition:  Follow up 6 months  Signed, Fransico Him, MD  11/24/2019 10:18 AM    [facility]

## 2019-12-01 NOTE — Addendum Note (Signed)
Addended by: Antonieta Iba on: 12/01/2019 10:58 AM   Modules accepted: Orders

## 2019-12-03 ENCOUNTER — Telehealth (HOSPITAL_COMMUNITY): Payer: Self-pay

## 2019-12-03 NOTE — Addendum Note (Signed)
Addended by: Antonieta Iba on: 12/03/2019 04:32 PM   Modules accepted: Orders

## 2019-12-03 NOTE — Addendum Note (Signed)
Addended by: Antonieta Iba on: 12/03/2019 08:17 AM   Modules accepted: Orders

## 2019-12-03 NOTE — Addendum Note (Signed)
Addended by: Antonieta Iba on: 12/03/2019 04:35 PM   Modules accepted: Orders

## 2019-12-03 NOTE — Addendum Note (Signed)
Addended by: Fransico Him R on: 12/03/2019 05:50 PM   Modules accepted: Orders

## 2019-12-03 NOTE — Telephone Encounter (Signed)
Attempted to contact the patient, no answer. Will try and contact again, later. S.Lamoine Fredricksen EMTP

## 2019-12-04 ENCOUNTER — Telehealth: Payer: Self-pay | Admitting: *Deleted

## 2019-12-04 DIAGNOSIS — G4733 Obstructive sleep apnea (adult) (pediatric): Secondary | ICD-10-CM

## 2019-12-04 NOTE — Telephone Encounter (Signed)
Order placed to [facility] via fax

## 2019-12-04 NOTE — Telephone Encounter (Signed)
-----   Message from Bobby Rumpf, [place] sent at 12/02/2019  2:13 PM EST ----- Accidentally deleted     Sueanne Margarita, MD  Freada Bergeron, CMA Please order a nasal mask with chin strap and get a download in 4 weeks

## 2019-12-08 ENCOUNTER — Ambulatory Visit (HOSPITAL_COMMUNITY): Payer: 59 | Attending: Cardiology

## 2019-12-08 ENCOUNTER — Other Ambulatory Visit: Payer: Self-pay

## 2019-12-08 DIAGNOSIS — R072 Precordial pain: Secondary | ICD-10-CM | POA: Insufficient documentation

## 2019-12-08 MED ORDER — TECHNETIUM TC 99M TETROFOSMIN IV KIT
32.9000 | PACK | Freq: Once | INTRAVENOUS | Status: AC | PRN
  Administered 2019-12-08: 32.9 via INTRAVENOUS
  Filled 2019-12-08: qty 33

## 2019-12-08 MED ORDER — REGADENOSON 0.4 MG/5ML IV SOLN
0.4000 mg | Freq: Once | INTRAVENOUS | Status: AC
  Administered 2019-12-08: 0.4 mg via INTRAVENOUS

## 2019-12-09 ENCOUNTER — Ambulatory Visit
Admission: RE | Admit: 2019-12-09 | Discharge: 2019-12-09 | Disposition: A | Discharge status: Home / Self Care | Payer: 59 | Source: Ambulatory Visit | Attending: Internal Medicine | Admitting: Internal Medicine

## 2019-12-09 ENCOUNTER — Ambulatory Visit (HOSPITAL_COMMUNITY): Payer: 59 | Attending: Cardiology

## 2019-12-09 DIAGNOSIS — Z1231 Encounter for screening mammogram for malignant neoplasm of breast: Secondary | ICD-10-CM

## 2019-12-09 LAB — MYOCARDIAL PERFUSION IMAGING
LV dias vol: 102 mL (ref 46–106)
LV sys vol: 33 mL
Peak HR: 89 {beats}/min
Rest HR: 60 {beats}/min
SDS: 3
SRS: 1
SSS: 4
TID: 1.07

## 2019-12-09 MED ORDER — TECHNETIUM TC 99M TETROFOSMIN IV KIT
31.6000 | PACK | Freq: Once | INTRAVENOUS | Status: AC | PRN
  Administered 2019-12-09: 31.6 via INTRAVENOUS
  Filled 2019-12-09: qty 32

## 2019-12-29 ENCOUNTER — Other Ambulatory Visit: Payer: Self-pay

## 2019-12-30 ENCOUNTER — Ambulatory Visit (INDEPENDENT_AMBULATORY_CARE_PROVIDER_SITE_OTHER): Payer: 59 | Admitting: Family Medicine

## 2019-12-30 ENCOUNTER — Encounter: Payer: Self-pay | Admitting: Family Medicine

## 2019-12-30 VITALS — BP 140/90 | HR 62 | Ht 70.0 in | Wt 293.0 lb

## 2019-12-30 DIAGNOSIS — I341 Nonrheumatic mitral (valve) prolapse: Secondary | ICD-10-CM | POA: Insufficient documentation

## 2019-12-30 DIAGNOSIS — N924 Excessive bleeding in the premenopausal period: Secondary | ICD-10-CM | POA: Insufficient documentation

## 2019-12-30 DIAGNOSIS — R202 Paresthesia of skin: Secondary | ICD-10-CM | POA: Diagnosis not present

## 2019-12-30 DIAGNOSIS — D259 Leiomyoma of uterus, unspecified: Secondary | ICD-10-CM | POA: Insufficient documentation

## 2019-12-30 DIAGNOSIS — N920 Excessive and frequent menstruation with regular cycle: Secondary | ICD-10-CM | POA: Insufficient documentation

## 2019-12-30 DIAGNOSIS — G2581 Restless legs syndrome: Secondary | ICD-10-CM | POA: Diagnosis not present

## 2019-12-30 DIAGNOSIS — M1990 Unspecified osteoarthritis, unspecified site: Secondary | ICD-10-CM | POA: Insufficient documentation

## 2019-12-30 DIAGNOSIS — N762 Acute vulvitis: Secondary | ICD-10-CM | POA: Insufficient documentation

## 2019-12-30 MED ORDER — PRAMIPEXOLE DIHYDROCHLORIDE 0.25 MG PO TABS: 0.2500 mg | ORAL_TABLET | Freq: Every day | ORAL | 5 refills | Status: DC

## 2019-12-30 NOTE — Patient Instructions (Signed)

## 2019-12-30 NOTE — Progress Notes (Signed)
Established Patient Office Visit  Subjective:  Patient ID: Jamie [name], female    DOB: [birth year]  Age: 59 y.o. MRN: [id number]  CC:  Chief Complaint  Patient presents with  . Foot Problem    HPI [name] presents for the following items  Her main concern is restless leg symptoms at night predominantly.  Right leg is worse than left.  She has a jumping restless symptom that is of lesser severity when she is ambulating or moving her leg.  This does interfere with sleep.  Symptoms progressive in recent months.  She had recent hemoglobin and multiple other labs back in September.  Hemoglobin was normal at that time.  She is postmenopausal.  Does drink a fair amount of caffeine during the day.  No known family history of restless leg symptoms.  Right great toe numbness for past year.  She has had prior back surgery.  No real pain.  Occasional mild paresthesia right leg but mostly confined to the right great toe.  She does take gabapentin at night.  Recent A1c 5.8% Recent B12 and thyroid normal  Past Medical History:  Diagnosis Date  . Acquired dilation of ascending aorta and aortic root ([place])    56mm by 2D echo 03/2019  . Allergy   . Arthritis    in back  . Chest pain    neg cath 2016 after false positive myoview  . Depression   . GERD (gastroesophageal reflux disease)   . Hyperglycemia   . Hyperlipidemia   . Hypertension   . Joint pain   . Knee pain   . Low back pain   . Migraine   . Mitral valve prolapse   . Muscle cramps   . Pulmonary HTN ([place])    mild with PASP 37mmHg by echo 03/2019    Past Surgical History:  Procedure Laterality Date  . BACK SURGERY    . CESAREAN SECTION    . LEFT HEART CATHETERIZATION WITH CORONARY ANGIOGRAM N/A 07/08/2013   Procedure: LEFT HEART CATHETERIZATION WITH CORONARY ANGIOGRAM;  Surgeon: Josue Hector, MD;  Location: [facility] CATH LAB;  Service: Cardiovascular;  Laterality: N/A;  . TUBAL LIGATION      Family History  Problem  Relation Age of Onset  . Hyperlipidemia Mother   . Hypertension Mother   . AAA (abdominal aortic aneurysm) Mother   . Stroke Father   . Hypertension Father   . Hyperlipidemia Brother        x2  . Hypertension Brother        x2  . Liver cancer Maternal Aunt   . Breast cancer Cousin   . Colon cancer Cousin   . Colon polyps Neg Hx   . Esophageal cancer Neg Hx   . Stomach cancer Neg Hx   . Rectal cancer Neg Hx     Social History   Socioeconomic History  . Marital status: Married    Spouse name: Jamie [name]  . Number of children: 1  . Years of education: Not on file  . Highest education level: Not on file  Occupational History    Employer: [employer]  Tobacco Use  . Smoking status: Former Smoker    Types: Cigarettes    Quit date: 07/31/1996    Years since quitting: 23.4  . Smokeless tobacco: Never Used  Vaping Use  . Vaping Use: Never used  Substance and Sexual Activity  . Alcohol use: Yes    Comment: very little  .  Drug use: No  . Sexual activity: Not on file  Other Topics Concern  . Not on file  Social History Narrative  . Not on file   Social Determinants of Health   Financial Resource Strain: Not on file  Food Insecurity: Not on file  Transportation Needs: Not on file  Physical Activity: Not on file  Stress: Not on file  Social Connections: Not on file  Intimate Partner Violence: Not on file    Outpatient Medications Prior to Visit  Medication Sig Dispense Refill  . acetaminophen (TYLENOL) 500 MG tablet Take 2 tablets (1,000 mg total) by mouth at bedtime. 30 tablet 0  . Ascorbic Acid (VITAMIN C) 100 MG tablet Take 100 mg by mouth daily.    Marland Kitchen aspirin EC 81 MG tablet Take 1 tablet (81 mg total) by mouth daily.    Marland Kitchen atorvastatin (LIPITOR) 80 MG tablet Take 1 tablet (80 mg total) by mouth daily. 90 tablet 0  . diclofenac sodium (VOLTAREN) 1 % GEL Apply 2 g topically 4 (four) times daily as needed (pain).    Marland Kitchen ELDERBERRY PO Take by mouth.    .  furosemide (LASIX) 20 MG tablet Take 1 tablet (20 mg total) by mouth daily. You may take 1 extra tablet (20 mg) as needed for increased swelling 90 tablet 3  . gabapentin (NEURONTIN) 300 MG capsule Take 300-600 mg by mouth at bedtime.     Marland Kitchen glucosamine-chondroitin 500-400 MG tablet Take 1 tablet by mouth daily.  (Patient not taking: Reported on 11/24/2019)    . hydrALAZINE (APRESOLINE) 100 MG tablet Take 1 tablet (100 mg total) by mouth 3 (three) times daily. 270 tablet 3  . Ibuprofen-Famotidine 800-26.6 MG TABS Take 1 tablet by mouth as directed. (Patient not taking: Reported on 11/24/2019)    . losartan (COZAAR) 100 MG tablet Take 1 tablet (100 mg total) by mouth daily. 90 tablet 1  . magnesium 30 MG tablet Take 30 mg by mouth 2 (two) times daily.    . metoprolol tartrate (LOPRESSOR) 25 MG tablet Take 1 tablet (25 mg total) by mouth 2 (two) times daily. 180 tablet 1  . Multiple Vitamin (MULTIVITAMIN) tablet Take 1 tablet by mouth daily. (Patient not taking: Reported on 11/24/2019)    . pantoprazole (PROTONIX) 40 MG tablet Take 1 tablet (40 mg total) by mouth daily. 30 tablet 11  . triamcinolone cream (KENALOG) 0.1 % APPLY 1 APPLICATION TOPICALLY DAILY AS NEEDED. 45 g 0  . VITAMIN D PO Take 1 tablet by mouth daily.     No facility-administered medications prior to visit.    No Known Allergies  ROS Review of Systems  Constitutional: Negative for chills and fever.  Respiratory: Negative for shortness of breath.   Cardiovascular: Negative for chest pain.  Gastrointestinal: Negative for abdominal pain.  Neurological: Positive for numbness. Negative for weakness.      Objective:    Physical Exam Vitals reviewed.  Cardiovascular:     Rate and Rhythm: Normal rate and regular rhythm.     Comments: Right foot is warm to touch with good dorsalis pedis pulses and excellent capillary refill. Pulmonary:     Effort: Pulmonary effort is normal.     Breath sounds: Normal breath sounds.   Musculoskeletal:     Right lower leg: No edema.     Left lower leg: No edema.  Neurological:     Mental Status: She is alert.     Comments: She has full strength with plantarflexion  and dorsiflexion bilaterally.  Deep tendon reflexes are difficult to elicit the knee and 1+ left ankle and trace right     BP 140/90   Pulse 62   Ht 5\' 10"  (1.778 m)   Wt 293 lb (132.9 kg)   LMP 10/04/2014   SpO2 98%   BMI 42.04 kg/m  Wt Readings from Last 3 Encounters:  12/30/19 293 lb (132.9 kg)  12/08/19 290 lb (131.5 kg)  11/24/19 290 lb (131.5 kg)     Health Maintenance Due  Topic Date Due  . COVID-19 Vaccine (3 - Pfizer risk 4-dose series) 07/30/2019  . PAP SMEAR-Modifier  12/02/2019    There are no preventive care reminders to display for this patient.  Lab Results  Component Value Date   TSH 1.78 09/04/2019   Lab Results  Component Value Date   WBC 4.6 09/04/2019   HGB 12.3 09/04/2019   HCT 38.2 09/04/2019   MCV 91.0 09/04/2019   PLT 238 09/04/2019   Lab Results  Component Value Date   NA 144 09/04/2019   K 4.1 09/04/2019   CO2 28 09/04/2019   GLUCOSE 98 09/04/2019   BUN 10 09/04/2019   CREATININE 0.90 09/04/2019   BILITOT 0.6 09/04/2019   ALKPHOS 74 08/14/2018   AST 17 09/04/2019   ALT 21 09/04/2019   PROT 6.8 09/04/2019   ALBUMIN 4.3 08/14/2018   CALCIUM 9.3 09/04/2019   ANIONGAP 5 11/28/2017   GFR 87.06 08/14/2018   Lab Results  Component Value Date   CHOL 116 09/04/2019   Lab Results  Component Value Date   HDL 54 09/04/2019   Lab Results  Component Value Date   LDLCALC 47 09/04/2019   Lab Results  Component Value Date   TRIG 74 09/04/2019   Lab Results  Component Value Date   CHOLHDL 2.1 09/04/2019   Lab Results  Component Value Date   HGBA1C 5.8 (H) 09/04/2019      Assessment & Plan:   Problem List Items Addressed This Visit   None   Visit Diagnoses    Restless legs    -  Primary   Paresthesia        Patient describes what  sounds like classic restless leg symptoms at night.  -Recommend she gradually scaled back caffeine use especially after about 2 PM -We discussed screening for iron deficiency but she had recent hemoglobin was normal. -We discussed trial of low-dose Mirapex 0.25 mg nightly and recommend close follow-up with primary for feedback -Regarding her right great toe numbness she is encouraged to follow-up with her neurosurgeon especially for any progressive symptoms.  Meds ordered this encounter  Medications  . pramipexole (MIRAPEX) 0.25 MG tablet    Sig: Take 1 tablet (0.25 mg total) by mouth at bedtime.    Dispense:  30 tablet    Refill:  5    Follow-up: No follow-ups on file.    Carolann Littler, MD

## 2020-01-17 ENCOUNTER — Other Ambulatory Visit: Payer: Self-pay | Admitting: Internal Medicine

## 2020-01-17 DIAGNOSIS — E785 Hyperlipidemia, unspecified: Secondary | ICD-10-CM

## 2020-01-27 ENCOUNTER — Emergency Department (HOSPITAL_COMMUNITY): Payer: 59

## 2020-01-27 ENCOUNTER — Ambulatory Visit (HOSPITAL_COMMUNITY)
Admission: EM | Admit: 2020-01-27 | Discharge: 2020-01-27 | Payer: 59 | Attending: Internal Medicine | Admitting: Internal Medicine

## 2020-01-27 ENCOUNTER — Other Ambulatory Visit: Payer: Self-pay

## 2020-01-27 ENCOUNTER — Encounter (HOSPITAL_COMMUNITY): Payer: Self-pay | Admitting: Emergency Medicine

## 2020-01-27 ENCOUNTER — Emergency Department (HOSPITAL_COMMUNITY)
Admission: EM | Admit: 2020-01-27 | Discharge: 2020-01-27 | Disposition: A | Discharge status: Home / Self Care | Payer: 59 | Attending: Emergency Medicine | Admitting: Emergency Medicine

## 2020-01-27 ENCOUNTER — Encounter (HOSPITAL_COMMUNITY): Payer: Self-pay

## 2020-01-27 DIAGNOSIS — I110 Hypertensive heart disease with heart failure: Secondary | ICD-10-CM | POA: Diagnosis not present

## 2020-01-27 DIAGNOSIS — Z87891 Personal history of nicotine dependence: Secondary | ICD-10-CM | POA: Insufficient documentation

## 2020-01-27 DIAGNOSIS — R0781 Pleurodynia: Secondary | ICD-10-CM

## 2020-01-27 DIAGNOSIS — Z79899 Other long term (current) drug therapy: Secondary | ICD-10-CM | POA: Insufficient documentation

## 2020-01-27 DIAGNOSIS — I5032 Chronic diastolic (congestive) heart failure: Secondary | ICD-10-CM | POA: Insufficient documentation

## 2020-01-27 DIAGNOSIS — R0602 Shortness of breath: Secondary | ICD-10-CM | POA: Diagnosis not present

## 2020-01-27 DIAGNOSIS — I10 Essential (primary) hypertension: Secondary | ICD-10-CM

## 2020-01-27 DIAGNOSIS — Z20822 Contact with and (suspected) exposure to covid-19: Secondary | ICD-10-CM | POA: Insufficient documentation

## 2020-01-27 DIAGNOSIS — R079 Chest pain, unspecified: Secondary | ICD-10-CM | POA: Insufficient documentation

## 2020-01-27 LAB — TROPONIN I (HIGH SENSITIVITY)
Troponin I (High Sensitivity): 4 ng/L (ref <18)
Troponin I (High Sensitivity): 12 ng/L (ref <18)

## 2020-01-27 LAB — BASIC METABOLIC PANEL
Anion gap: 11 (ref 5–15)
BUN: 10 mg/dL (ref 6–20)
CO2: 26 mmol/L (ref 22–32)
Calcium: 9.3 mg/dL (ref 8.9–10.3)
Chloride: 104 mmol/L (ref 98–111)
Creatinine, Ser: 0.86 mg/dL (ref 0.44–1.00)
GFR, Estimated: >60 mL/min (ref >60)
Glucose, Bld: 88 mg/dL (ref 70–99)
Potassium: 3.6 mmol/L (ref 3.5–5.1)
Sodium: 141 mmol/L (ref 135–145)

## 2020-01-27 LAB — CBC
HCT: 39.5 % (ref 36.0–46.0)
Hemoglobin: 12.8 g/dL (ref 12.0–15.0)
MCH: 29.2 pg (ref 26.0–34.0)
MCHC: 32.4 g/dL (ref 30.0–36.0)
MCV: 90 fL (ref 80.0–100.0)
Platelets: 260 10*3/uL (ref 150–400)
RBC: 4.39 MIL/uL (ref 3.87–5.11)
RDW: 15.2 % (ref 11.5–15.5)
WBC: 4 10*3/uL (ref 4.0–10.5)
nRBC: 0 % (ref 0.0–0.2)

## 2020-01-27 LAB — BRAIN NATRIURETIC PEPTIDE: B Natriuretic Peptide: 21.9 pg/mL (ref 0.0–100.0)

## 2020-01-27 LAB — SARS CORONAVIRUS 2 BY RT PCR (HOSPITAL ORDER, PERFORMED IN ~~LOC~~ HOSPITAL LAB): SARS Coronavirus 2: NEGATIVE

## 2020-01-27 LAB — D-DIMER, QUANTITATIVE: D-Dimer, Quant: 0.36 ug/mL-FEU (ref 0.00–0.50)

## 2020-01-27 MED ORDER — NITROGLYCERIN 0.4 MG SL SUBL
0.4000 mg | SUBLINGUAL_TABLET | SUBLINGUAL | Status: DC | PRN
  Administered 2020-01-27 (×2): 0.4 mg via SUBLINGUAL
  Filled 2020-01-27: qty 1

## 2020-01-27 MED ORDER — ONDANSETRON HCL 4 MG/2ML IJ SOLN
4.0000 mg | Freq: Once | INTRAMUSCULAR | Status: AC
  Administered 2020-01-27: 4 mg via INTRAVENOUS
  Filled 2020-01-27: qty 2

## 2020-01-27 MED ORDER — FUROSEMIDE 20 MG PO TABS: 40.0000 mg | ORAL_TABLET | Freq: Every day | ORAL | 3 refills | Status: DC

## 2020-01-27 MED ORDER — MORPHINE SULFATE (PF) 4 MG/ML IV SOLN
4.0000 mg | Freq: Once | INTRAVENOUS | Status: AC
  Administered 2020-01-27: 4 mg via INTRAVENOUS
  Filled 2020-01-27: qty 1

## 2020-01-27 MED ORDER — ASPIRIN 81 MG PO CHEW
324.0000 mg | CHEWABLE_TABLET | Freq: Once | ORAL | Status: AC
  Administered 2020-01-27: 324 mg via ORAL
  Filled 2020-01-27: qty 4

## 2020-01-27 MED ORDER — IOHEXOL 350 MG/ML SOLN
100.0000 mL | Freq: Once | INTRAVENOUS | Status: AC | PRN
  Administered 2020-01-27: 100 mL via INTRAVENOUS

## 2020-01-27 NOTE — ED Notes (Signed)
Patient verbalized understanding of discharge instructions. Opportunity for questions and answers.  

## 2020-01-27 NOTE — ED Notes (Signed)
Patient is being discharged from the Urgent Care and sent to the Emergency Department via wheelchair . Per Dr Lanny Cramp, patient is in need of higher level of care due to possible PE. Patient is aware and verbalizes understanding of plan of care.   Vitals:   01/27/20 0833  BP: 132/81  Pulse: 68  Resp: 20  Temp: 98.2 F (36.8 C)  SpO2: 99%

## 2020-01-27 NOTE — ED Triage Notes (Signed)
Patient complaining of sharp left sided chest pain and shortness of breath that started yesterday. Patient is vaccinated against COVID, denies sick contacts.

## 2020-01-27 NOTE — ED Triage Notes (Signed)
Pt presents with central chest tightness, shortness of breath, and back pain with breathing in & out X 2 days.

## 2020-01-27 NOTE — Discharge Instructions (Addendum)
Please go to the emergency department to be evaluated for further evaluation I am worried that she may have pulmonary embolism given the pleuritic chest pain, shortness of breath and recent long distance travel.

## 2020-01-27 NOTE — ED Provider Notes (Signed)
Patient presented with chest pain.  Signed out to me by prior physician to follow-up additional testing.  Second troponin appears more elevated than prior but continues to be negative.  CT angio shows no acute changes from prior imaging.  Given slightly elevated second troponin, case discussed with cardiology, review of prior records show a negative Lexiscan last month, completely normal CT cardiac coronary last year.  Cardiology recommending continued outpatient follow-up.  Upon my evaluation, patient states her chest pain is improving with interventions provided here in the ER.  Advised immediate return for worsening pain, otherwise discharged home in stable condition.     Luna Fuse, MD 01/27/20 [phone]

## 2020-01-27 NOTE — ED Provider Notes (Signed)
[facility]    CSN: [id number] Arrival date & time: 01/27/20  4098      History   Chief Complaint Chief Complaint  Patient presents with   Chest Tightness   Shortness of Breath   Back Pain    HPI Jamie [name] is a 60 y.o. female with a history of hypertension comes to urgent care with complaints of central to left-sided chest pain and intermittent shortness of breath over the past couple of days.  Pain is severe, sharp, aggravated by taking a deep breath and denies any relieving factors. Patient denies any cough or sputum production.  No trauma to the chest.  No chest tightness.  Shortness of breath is exertional.   No fever or chills.  No wheezing.  Patient recently traveled by road to [place] for funeral (roundtrip of about 8 to 9 hours).  She has slight pain in the left leg.  HPI  Past Medical History:  Diagnosis Date   Acquired dilation of ascending aorta and aortic root ([place])    71mm by 2D echo 03/2019   Allergy    Arthritis    in back   Chest pain    neg cath 2016 after false positive myoview   Depression    GERD (gastroesophageal reflux disease)    Hyperglycemia    Hyperlipidemia    Hypertension    Joint pain    Knee pain    Low back pain    Migraine    Mitral valve prolapse    Muscle cramps    Pulmonary HTN ([place])    mild with PASP 56mmHg by echo 03/2019    Patient Active Problem List   Diagnosis Date Noted   Abnormal perimenopausal bleeding 12/30/2019   Arthritis 12/30/2019   Menorrhagia 12/30/2019   Mitral valve prolapse 12/30/2019   Uterine leiomyoma 12/30/2019   Vulvitis 12/30/2019   Restless legs 12/30/2019   Congestive heart failure ([place]) 04/14/2019   Cyst of right breast 04/14/2019   Hyperglycemia 04/14/2019   Acquired dilation of ascending aorta and aortic root (HCC)    Pulmonary HTN (HCC)    Chronic diastolic CHF (congestive heart failure) ([place]) 08/28/2018   IGT (impaired glucose tolerance)  08/14/2018   Genital herpes 04/16/2018   Chronic back pain 04/16/2018   Syncope 10/06/2017   Other fatigue 10/01/2017   Shortness of breath on exertion 10/01/2017   Acute low back pain 02/04/2017   Degeneration of lumbar intervertebral disc 02/04/2017   Degenerative lumbar spinal stenosis 02/04/2017   Herpes simplex type 2 infection 06/20/2016   Hyperlipemia 05/06/2015   GERD (gastroesophageal reflux disease) 08/01/2010   Hypertension 01/20/2007   Obesity 09/05/2006   Migraine headache 09/05/2006    Past Surgical History:  Procedure Laterality Date   BACK SURGERY     CESAREAN SECTION     LEFT HEART CATHETERIZATION WITH CORONARY ANGIOGRAM N/A 07/08/2013   Procedure: LEFT HEART CATHETERIZATION WITH CORONARY ANGIOGRAM;  Surgeon: Josue Hector, MD;  Location: [facility] CATH LAB;  Service: Cardiovascular;  Laterality: N/A;   TUBAL LIGATION      OB History    Gravida  1   Para  1   Term      Preterm      AB      Living  1     SAB      IAB      Ectopic      Multiple      Live Births  Home Medications    Prior to Admission medications   Medication Sig Start Date End Date Taking? Authorizing Provider  acetaminophen (TYLENOL) 500 MG tablet Take 2 tablets (1,000 mg total) by mouth at bedtime. 10/07/17   Doreatha Lew, MD  Ascorbic Acid (VITAMIN C) 100 MG tablet Take 100 mg by mouth daily.    [provider]  aspirin EC 81 MG tablet Take 1 tablet (81 mg total) by mouth daily. 11/05/18   Isaac Bliss, Rayford Halsted, MD  atorvastatin (LIPITOR) 80 MG tablet TAKE 1 TABLET BY MOUTH EVERY DAY 01/19/20   Isaac Bliss, Rayford Halsted, MD  diclofenac sodium (VOLTAREN) 1 % GEL Apply 2 g topically 4 (four) times daily as needed (pain).    [provider]  ELDERBERRY PO Take by mouth.    [provider]  furosemide (LASIX) 20 MG tablet Take 2 tablets (40 mg total) by mouth daily. You may take 1 extra tablet (20 mg) as needed  for increased swelling 01/27/20   Neta Upadhyay, Myrene Galas, MD  gabapentin (NEURONTIN) 300 MG capsule Take 300-600 mg by mouth at bedtime.     [provider]  hydrALAZINE (APRESOLINE) 100 MG tablet Take 1 tablet (100 mg total) by mouth 3 (three) times daily. 11/24/19   Sueanne Margarita, MD  losartan (COZAAR) 100 MG tablet Take 1 tablet (100 mg total) by mouth daily. 07/29/19   Isaac Bliss, Rayford Halsted, MD  magnesium 30 MG tablet Take 30 mg by mouth 2 (two) times daily.    [provider]  metoprolol tartrate (LOPRESSOR) 25 MG tablet Take 1 tablet (25 mg total) by mouth 2 (two) times daily. 03/25/19   Isaac Bliss, Rayford Halsted, MD  pantoprazole (PROTONIX) 40 MG tablet Take 1 tablet (40 mg total) by mouth daily. 03/04/19   Sueanne Margarita, MD  pramipexole (MIRAPEX) 0.25 MG tablet Take 1 tablet (0.25 mg total) by mouth at bedtime. 12/30/19   Burchette, Alinda Sierras, MD  triamcinolone cream (KENALOG) 0.1 % APPLY 1 APPLICATION TOPICALLY DAILY AS NEEDED. 01/06/18   Martinique, Betty G, MD  VITAMIN D PO Take 1 tablet by mouth daily.    [provider]    Family History Family History  Problem Relation Age of Onset   Hyperlipidemia Mother    Hypertension Mother    AAA (abdominal aortic aneurysm) Mother    Stroke Father    Hypertension Father    Hyperlipidemia Brother        x2   Hypertension Brother        x2   Liver cancer Maternal Aunt    Breast cancer Cousin    Colon cancer Cousin    Colon polyps Neg Hx    Esophageal cancer Neg Hx    Stomach cancer Neg Hx    Rectal cancer Neg Hx     Social History Social History   Tobacco Use   Smoking status: Former Smoker    Types: Cigarettes    Quit date: 07/31/1996    Years since quitting: 23.5   Smokeless tobacco: Never Used  Vaping Use   Vaping Use: Never used  Substance Use Topics   Alcohol use: Yes    Comment: very little   Drug use: No     Allergies   Patient has no known allergies.   Review of  Systems Review of Systems  Constitutional: Negative for activity change, chills, fatigue and unexpected weight change.  Respiratory: Positive for shortness of breath. Negative for cough,  choking, chest tightness and wheezing.   Cardiovascular: Positive for chest pain.  Gastrointestinal: Negative for diarrhea, nausea and vomiting.  Musculoskeletal: Negative.   Neurological: Negative for dizziness, light-headedness and headaches.     Physical Exam Triage Vital Signs ED Triage Vitals  Enc Vitals Group     BP 01/27/20 0833 132/81     Pulse Rate 01/27/20 0833 68     Resp 01/27/20 0833 20     Temp 01/27/20 0833 98.2 F (36.8 C)     Temp Source 01/27/20 0833 Oral     SpO2 01/27/20 0833 99 %     Weight --      Height --      Head Circumference --      Peak Flow --      Pain Score 01/27/20 0832 3     Pain Loc --      Pain Edu? --      Excl. in [place]? --    No data found.  Updated Vital Signs BP 132/81 (BP Location: Left Arm)    Pulse 68    Temp 98.2 F (36.8 C) (Oral)    Resp 20    LMP 10/04/2014    SpO2 99%   Visual Acuity Right Eye Distance:   Left Eye Distance:   Bilateral Distance:    Right Eye Near:   Left Eye Near:    Bilateral Near:     Physical Exam Vitals and nursing note reviewed.  Constitutional:      General: She is not in acute distress.    Appearance: She is not ill-appearing.  Cardiovascular:     Rate and Rhythm: Normal rate and regular rhythm.  Pulmonary:     Breath sounds: No decreased breath sounds, wheezing, rhonchi or rales.  Chest:     Chest wall: No mass or tenderness.  Abdominal:     General: Bowel sounds are normal.     Palpations: Abdomen is soft.  Musculoskeletal:        General: Normal range of motion.     Left lower leg: Tenderness present.  Neurological:     Mental Status: She is alert.      UC Treatments / Results  Labs (all labs ordered are listed, but only abnormal results are displayed) Labs Reviewed - No data to  display  EKG   Radiology   Procedures Procedures (including critical care time)  Medications Ordered in UC Medications - No data to display  Initial Impression / Assessment and Plan / UC Course  I have reviewed the triage vital signs and the nursing notes.  Pertinent labs & imaging results that were available during my care of the patient were reviewed by me and considered in my medical decision making (see chart for details).     1.  Pleuritic chest pain: Patient is currently in excruciating pain.  Given the history of recent long distance travel, pleuritic chest pain and shortness of breath-patient is advised to go to the emergency department to be evaluated for pulmonary embolism.  Patient agrees with the plan of care. Final Clinical Impressions(s) / UC Diagnoses   Final diagnoses:  Pleuritic chest pain     Discharge Instructions     Please go to the emergency department to be evaluated for further evaluation I am worried that she may have pulmonary embolism given the pleuritic chest pain, shortness of breath and recent long distance travel.     ED Prescriptions    Medication Sig Dispense Auth.  Provider   furosemide (LASIX) 20 MG tablet Take 2 tablets (40 mg total) by mouth daily. You may take 1 extra tablet (20 mg) as needed for increased swelling 90 tablet Brittyn Salaz, Myrene Galas, MD     PDMP not reviewed this encounter.   Chase Picket, MD 01/28/20 [phone]

## 2020-01-27 NOTE — Discharge Instructions (Addendum)
Call your primary care doctor or specialist as discussed in the next 2-3 days.   Return immediately back to the ER if:  Your symptoms worsen within the next 12-24 hours. You develop new symptoms such as new fevers, persistent vomiting, new pain, shortness of breath, or new weakness or numbness, or if you have any other concerns.  

## 2020-01-27 NOTE — ED Provider Notes (Signed)
[facility] EMERGENCY DEPARTMENT Provider Note   CSN: [id number] Arrival date & time: 01/27/20  1049     History Chief Complaint  Patient presents with  . Chest Pain    Jamie [name] is a 60 y.o. female.  Patient is a 60 year old female with a history of hypertension, hyperlipidemia, mitral valve prolapse, pulmonary hypertension, acquired dilation of the ascending aortic root, CHF who is presenting today with chest pain and shortness of breath. Patient reports the chest pain started mildly on Monday but worsened yesterday and has become even worse today. She is also noticed worsening shortness of breath over the last 2 days. Patient reports that any type of walking or exertion makes her symptoms worse as well as taking a deep breath and movements. The pain is in the center of her chest and radiates to her left chest around the side and into her left back. She denies any abdominal pain, nausea or vomiting. She also complains of orthopnea and is having to put her bed up higher so that she can sleep comfortably at night. She has been taking her diuretic as prescribed but has noticed more swelling in her legs than usual. She does not check her weights regularly. Patient does report recent travel to [place] and to [facility] after a death in her family. This occurred all last week and she was on the road and not eating her normal diet either. She denies any cough, fever, congestion or infectious type issues. She reports she has had pain like this in the past but is never been this severe. She tried taking Tylenol at home for the pain with no improvement. Patient did have a stress test 1 month ago which was within normal limits and also had an echo done in March 2021 that did show aortic root dilation, mild left ventricular hypertrophy but otherwise no acute findings. She denies any tobacco, alcohol or drug use. No recent medication changes.  The history is provided  by the patient and medical records.  Chest Pain      Past Medical History:  Diagnosis Date  . Acquired dilation of ascending aorta and aortic root ([place])    76mm by 2D echo 03/2019  . Allergy   . Arthritis    in back  . Chest pain    neg cath 2016 after false positive myoview  . Depression   . GERD (gastroesophageal reflux disease)   . Hyperglycemia   . Hyperlipidemia   . Hypertension   . Joint pain   . Knee pain   . Low back pain   . Migraine   . Mitral valve prolapse   . Muscle cramps   . Pulmonary HTN ([place])    mild with PASP 5mmHg by echo 03/2019    Patient Active Problem List   Diagnosis Date Noted  . Abnormal perimenopausal bleeding 12/30/2019  . Arthritis 12/30/2019  . Menorrhagia 12/30/2019  . Mitral valve prolapse 12/30/2019  . Uterine leiomyoma 12/30/2019  . Vulvitis 12/30/2019  . Restless legs 12/30/2019  . Congestive heart failure ([place]) 04/14/2019  . Cyst of right breast 04/14/2019  . Hyperglycemia 04/14/2019  . Acquired dilation of ascending aorta and aortic root ([place])   . Pulmonary HTN ([place])   . Chronic diastolic CHF (congestive heart failure) ([place]) 08/28/2018  . IGT (impaired glucose tolerance) 08/14/2018  . Genital herpes 04/16/2018  . Chronic back pain 04/16/2018  . Syncope 10/06/2017  . Other fatigue 10/01/2017  . Shortness of  breath on exertion 10/01/2017  . Acute low back pain 02/04/2017  . Degeneration of lumbar intervertebral disc 02/04/2017  . Degenerative lumbar spinal stenosis 02/04/2017  . Herpes simplex type 2 infection 06/20/2016  . Hyperlipemia 05/06/2015  . GERD (gastroesophageal reflux disease) 08/01/2010  . Hypertension 01/20/2007  . Obesity 09/05/2006  . Migraine headache 09/05/2006    Past Surgical History:  Procedure Laterality Date  . BACK SURGERY    . CESAREAN SECTION    . LEFT HEART CATHETERIZATION WITH CORONARY ANGIOGRAM N/A 07/08/2013   Procedure: LEFT HEART CATHETERIZATION WITH CORONARY ANGIOGRAM;  Surgeon: Josue Hector, MD;  Location: [facility] CATH LAB;  Service: Cardiovascular;  Laterality: N/A;  . TUBAL LIGATION       OB History    Gravida  1   Para  1   Term      Preterm      AB      Living  1     SAB      IAB      Ectopic      Multiple      Live Births              Family History  Problem Relation Age of Onset  . Hyperlipidemia Mother   . Hypertension Mother   . AAA (abdominal aortic aneurysm) Mother   . Stroke Father   . Hypertension Father   . Hyperlipidemia Brother        x2  . Hypertension Brother        x2  . Liver cancer Maternal Aunt   . Breast cancer Cousin   . Colon cancer Cousin   . Colon polyps Neg Hx   . Esophageal cancer Neg Hx   . Stomach cancer Neg Hx   . Rectal cancer Neg Hx     Social History   Tobacco Use  . Smoking status: Former Smoker    Types: Cigarettes    Quit date: 07/31/1996    Years since quitting: 23.5  . Smokeless tobacco: Never Used  Vaping Use  . Vaping Use: Never used  Substance Use Topics  . Alcohol use: Yes    Comment: very little  . Drug use: No    Home Medications Prior to Admission medications   Medication Sig Start Date End Date Taking? Authorizing Provider  acetaminophen (TYLENOL) 500 MG tablet Take 2 tablets (1,000 mg total) by mouth at bedtime. 10/07/17   Doreatha Lew, MD  Ascorbic Acid (VITAMIN C) 100 MG tablet Take 100 mg by mouth daily.    [provider]  aspirin EC 81 MG tablet Take 1 tablet (81 mg total) by mouth daily. 11/05/18   Isaac Bliss, Rayford Halsted, MD  atorvastatin (LIPITOR) 80 MG tablet TAKE 1 TABLET BY MOUTH EVERY DAY 01/19/20   Isaac Bliss, Rayford Halsted, MD  diclofenac sodium (VOLTAREN) 1 % GEL Apply 2 g topically 4 (four) times daily as needed (pain).    [provider]  ELDERBERRY PO Take by mouth.    [provider]  furosemide (LASIX) 20 MG tablet Take 2 tablets (40 mg total) by mouth daily. You may take 1 extra tablet (20 mg) as needed for increased  swelling 01/27/20   Lamptey, Myrene Galas, MD  gabapentin (NEURONTIN) 300 MG capsule Take 300-600 mg by mouth at bedtime.     [provider]  hydrALAZINE (APRESOLINE) 100 MG tablet Take 1 tablet (100 mg total) by mouth 3 (three) times daily. 11/24/19  Sueanne Margarita, MD  losartan (COZAAR) 100 MG tablet Take 1 tablet (100 mg total) by mouth daily. 07/29/19   Isaac Bliss, Rayford Halsted, MD  magnesium 30 MG tablet Take 30 mg by mouth 2 (two) times daily.    [provider]  metoprolol tartrate (LOPRESSOR) 25 MG tablet Take 1 tablet (25 mg total) by mouth 2 (two) times daily. 03/25/19   Isaac Bliss, Rayford Halsted, MD  pantoprazole (PROTONIX) 40 MG tablet Take 1 tablet (40 mg total) by mouth daily. 03/04/19   Sueanne Margarita, MD  pramipexole (MIRAPEX) 0.25 MG tablet Take 1 tablet (0.25 mg total) by mouth at bedtime. 12/30/19   Burchet[place], Alinda Sierras, MD  triamcinolone cream (KENALOG) 0.1 % APPLY 1 APPLICATION TOPICALLY DAILY AS NEEDED. 01/06/18   Martinique, Betty G, MD  VITAMIN D PO Take 1 tablet by mouth daily.    [provider]    Allergies    Patient has no known allergies.  Review of Systems   Review of Systems  Cardiovascular: Positive for chest pain.  All other systems reviewed and are negative.   Physical Exam Updated Vital Signs BP (!) 171/90 (BP Location: Right Arm)   Pulse 62   Temp 98.2 F (36.8 C) (Oral)   Resp 18   LMP 10/04/2014   SpO2 100%   Physical Exam Vitals and nursing note reviewed.  Constitutional:      General: She is not in acute distress.    Appearance: She is well-developed and well-nourished. She is obese.     Comments: Appears uncomfortable when she attempts to move around, sit up or walk  HENT:     Head: Normocephalic and atraumatic.     Mouth/Throat:     Mouth: Mucous membranes are moist.  Eyes:     Extraocular Movements: EOM normal.     Pupils: Pupils are equal, round, and reactive to light.  Cardiovascular:     Rate and Rhythm:  Normal rate and regular rhythm.     Pulses: Normal pulses and intact distal pulses.     Heart sounds: Normal heart sounds. No murmur heard. No friction rub.  Pulmonary:     Effort: Pulmonary effort is normal. Tachypnea present.     Breath sounds: Normal breath sounds. No wheezing or rales.  Chest:     Chest wall: Tenderness present.    Abdominal:     General: Bowel sounds are normal. There is no distension.     Palpations: Abdomen is soft.     Tenderness: There is no abdominal tenderness. There is no guarding or rebound.  Musculoskeletal:        General: No tenderness. Normal range of motion.     Right lower leg: Edema present.     Left lower leg: Edema present.     Comments: 1+ pitting edema to bilateral shins  Skin:    General: Skin is warm and dry.     Findings: No rash.     Comments: No rashes noted over the side of the chest  Neurological:     General: No focal deficit present.     Mental Status: She is alert and oriented to person, place, and time. Mental status is at baseline.     Cranial Nerves: No cranial nerve deficit.  Psychiatric:        Mood and Affect: Mood and affect and mood normal.        Behavior: Behavior normal.        Thought  Content: Thought content normal.     ED Results / Procedures / Treatments   Labs (all labs ordered are listed, but only abnormal results are displayed) Labs Reviewed  BASIC METABOLIC PANEL  CBC  D-DIMER, QUANTITATIVE (NOT AT [facility])  BRAIN NATRIURETIC PEPTIDE  TROPONIN I (HIGH SENSITIVITY)  TROPONIN I (HIGH SENSITIVITY)    EKG None  Radiology DG Chest 2 View  Result Date: 01/27/2020 CLINICAL DATA:  Chest pain and shortness of breath EXAM: CHEST - 2 VIEW COMPARISON:  November 28, 2017 FINDINGS: Lungs are clear. Heart size and pulmonary vascularity are normal. No adenopathy. No pneumothorax. No bone lesions. IMPRESSION: Lungs clear.  Cardiac silhouette normal. Electronically Signed   By: Lowella Grip III M.D.   On:  01/27/2020 12:02    Procedures Procedures   Medications Ordered in ED Medications  morphine 4 MG/ML injection 4 mg (has no administration in time range)  ondansetron (ZOFRAN) injection 4 mg (has no administration in time range)    ED Course  I have reviewed the triage vital signs and the nursing notes.  Pertinent labs & imaging results that were available during my care of the patient were reviewed by me and considered in my medical decision making (see chart for details).    MDM Rules/Calculators/A&P                          Patient with multiple medical problems presenting today with nonspecific chest pain. It does seem to be worse with movements, palpation and walking. She is also had worsening shortness of breath. Patient does have a history of CHF, pulmonary hypertension and aortic root dilation. No history of MI in the past and no significant findings when she had a catheterization in 2015. Patient did have a stress test done in December 2021 without acute findings. Patient's labs today show a normal BMP, CBC and initial troponin. Her EKG does show a sinus rhythm with some abnormal R wave progression which is unchanged from prior. Chest x-ray within normal limits. Given patient's worsening swelling and history of CHF a BNP is pending. Chest x-ray without acute findings and low suspicion at this time for pericardial tamponade, pericarditis or myocarditis. There is no rash to suggest zoster. Also will check a D-dimer to ensure no issues with PE given her recent travel. If all of this is normal will consider doing this CTA to rule out worsening of her dilated aortic root or dissection. Patient reports currently the pain is a 10 out of 10 but vital signs are reassuring other than hypertension.  Pt given pain  Control.  Final Clinical Impression(s) / ED Diagnoses Final diagnoses:  None    Rx / DC Orders ED Discharge Orders    None       Blanchie Dessert, MD 01/30/20 [phone]

## 2020-02-02 ENCOUNTER — Encounter: Payer: Self-pay | Admitting: Cardiology

## 2020-02-02 ENCOUNTER — Other Ambulatory Visit: Payer: Self-pay

## 2020-02-02 ENCOUNTER — Telehealth: Payer: Self-pay | Admitting: Cardiology

## 2020-02-02 ENCOUNTER — Telehealth (INDEPENDENT_AMBULATORY_CARE_PROVIDER_SITE_OTHER): Payer: 59 | Admitting: Cardiology

## 2020-02-02 VITALS — BP 170/110 | HR 80 | Ht 71.0 in | Wt 290.0 lb

## 2020-02-02 DIAGNOSIS — G4733 Obstructive sleep apnea (adult) (pediatric): Secondary | ICD-10-CM | POA: Diagnosis not present

## 2020-02-02 DIAGNOSIS — I5032 Chronic diastolic (congestive) heart failure: Secondary | ICD-10-CM

## 2020-02-02 DIAGNOSIS — I272 Pulmonary hypertension, unspecified: Secondary | ICD-10-CM | POA: Diagnosis not present

## 2020-02-02 DIAGNOSIS — I10 Essential (primary) hypertension: Secondary | ICD-10-CM

## 2020-02-02 MED ORDER — CARVEDILOL 12.5 MG PO TABS: 12.5000 mg | ORAL_TABLET | Freq: Two times a day (BID) | ORAL | 3 refills | Status: DC

## 2020-02-02 NOTE — Progress Notes (Signed)
Virtual Visit via Video Note   This visit type was conducted due to national recommendations for restrictions regarding the COVID-19 Pandemic (e.g. social distancing) in an effort to limit this patient's exposure and mitigate transmission in our community.  Due to her co-morbid illnesses, this patient is at least at moderate risk for complications without adequate follow up.  This format is felt to be most appropriate for this patient at this time.  The patient did not have access to video technology/had technical difficulties with video requiring transitioning to audio format only (telephone).  All issues noted in this document were discussed and addressed.  No physical exam could be performed with this format.  Please refer to the patient's chart for her  consent to telehealth for [facility].   Evaluation Performed:  Follow-up visit  This visit type was conducted due to national recommendations for restrictions regarding the COVID-19 Pandemic (e.g. social distancing).  This format is felt to be most appropriate for this patient at this time.  All issues noted in this document were discussed and addressed.  No physical exam was performed (except for noted visual exam findings with Video Visits).  Please refer to the patient's chart (MyChart message for video visits and phone note for telephone visits) for the patient's consent to telehealth for [facility].  Date:  02/02/2020   ID:  Jamie [name], DOB 12/18/1960, MRN [id number]  Patient Location:  Home  Provider location:   [place]  PCP:  Isaac Bliss, Rayford Halsted, MD  Cardiologist:  Fransico Him, MD  Electrophysiologist:  None   Chief Complaint:  Chest pain and SOB  History of Present Illness:    Jamie [name] is a 60 y.o. female who presents via audio/video conferencing for a telehealth visit today.    This is a 60yo AAF with a hx of CP in the past with false positive myoview and normal coronary arteries in 2016.   She has a hx of GERD, HTN, HLD and MVP by remote echo.  She saw me in Jan 2021 with recurrent CP and DOE.  2D echo Aug 2020 was normal with G1DD.  CP was atypical (sharp and stabbing) and belching improved her sx but sometimes would radiate into her left arm with tingling.  She also complained of some palpitations.  She underwent coronary CTA showing a Ca++ score of 0 and no CAD.  Repeat 2D echo showed normal LVF with G2DD and mild pulmonary HTN with PASP 31mmHg and mildly dilated ascending aorta at 40mm.   Pro-BNP was normal.   She also has OSA and a sleep study showed severe OSA with an AHI of 72/hr and was started on auto CPAP.  Unfortunately she stopped using her device at last OV because she said it hurt her teeth.  Unfortunately she never followup with getting a new mask and due to noncompliance her insurance company made her hand it back in.    She is here today for followup and is doing well.  She denies any chest pain or pressure, SOB, DOE, PND, orthopnea, LE edema, dizzines or syncope. She occasionally will notice a skipped heart beat but nothing significant.  She is compliant with her meds and is tolerating meds with no SE.    Prior CV studies:   The following studies were reviewed today:  2D echo, sleep study  Past Medical History:  Diagnosis Date  . Acquired dilation of ascending aorta and aortic root (HCC)    61mm by  2D echo 03/2019  . Allergy   . Arthritis    in back  . Chest pain    neg cath 2016 after false positive myoview  . Depression   . GERD (gastroesophageal reflux disease)   . Hyperglycemia   . Hyperlipidemia   . Hypertension   . Joint pain   . Knee pain   . Low back pain   . Migraine   . Mitral valve prolapse   . Muscle cramps   . Pulmonary HTN ([place])    mild with PASP 55mmHg by echo 03/2019   Past Surgical History:  Procedure Laterality Date  . BACK SURGERY    . CESAREAN SECTION    . LEFT HEART CATHETERIZATION WITH CORONARY ANGIOGRAM N/A 07/08/2013    Procedure: LEFT HEART CATHETERIZATION WITH CORONARY ANGIOGRAM;  Surgeon: Josue Hector, MD;  Location: [facility] CATH LAB;  Service: Cardiovascular;  Laterality: N/A;  . TUBAL LIGATION       Current Meds  Medication Sig  . acetaminophen (TYLENOL) 500 MG tablet Take 2 tablets (1,000 mg total) by mouth at bedtime.  . Ascorbic Acid (VITAMIN C) 100 MG tablet Take 100 mg by mouth daily.  Marland Kitchen aspirin EC 81 MG tablet Take 1 tablet (81 mg total) by mouth daily.  Marland Kitchen atorvastatin (LIPITOR) 80 MG tablet TAKE 1 TABLET BY MOUTH EVERY DAY  . diclofenac sodium (VOLTAREN) 1 % GEL Apply 2 g topically 4 (four) times daily as needed (pain).  Marland Kitchen ELDERBERRY PO Take by mouth.  . furosemide (LASIX) 20 MG tablet Take 2 tablets (40 mg total) by mouth daily. You may take 1 extra tablet (20 mg) as needed for increased swelling  . gabapentin (NEURONTIN) 300 MG capsule Take 300-600 mg by mouth at bedtime.   . hydrALAZINE (APRESOLINE) 100 MG tablet Take 1 tablet (100 mg total) by mouth 3 (three) times daily.  Marland Kitchen losartan (COZAAR) 100 MG tablet Take 1 tablet (100 mg total) by mouth daily.  . magnesium 30 MG tablet Take 30 mg by mouth 2 (two) times daily.  . metoprolol tartrate (LOPRESSOR) 25 MG tablet Take 1 tablet (25 mg total) by mouth 2 (two) times daily.  . pantoprazole (PROTONIX) 40 MG tablet Take 1 tablet (40 mg total) by mouth daily.  . pramipexole (MIRAPEX) 0.25 MG tablet Take 1 tablet (0.25 mg total) by mouth at bedtime.  . triamcinolone cream (KENALOG) 0.1 % APPLY 1 APPLICATION TOPICALLY DAILY AS NEEDED.  Marland Kitchen VITAMIN D PO Take 1 tablet by mouth daily.     Allergies:   Patient has no known allergies.   Social History   Tobacco Use  . Smoking status: Former Smoker    Types: Cigarettes    Quit date: 07/31/1996    Years since quitting: 23.5  . Smokeless tobacco: Never Used  Vaping Use  . Vaping Use: Never used  Substance Use Topics  . Alcohol use: Yes    Comment: very little  . Drug use: No     Family Hx: The  patient's family history includes AAA (abdominal aortic aneurysm) in her mother; Breast cancer in her cousin; Colon cancer in her cousin; Hyperlipidemia in her brother and mother; Hypertension in her brother, father, and mother; Liver cancer in her maternal aunt; Stroke in her father. There is no history of Colon polyps, Esophageal cancer, Stomach cancer, or Rectal cancer.  ROS:   Please see the history of present illness.     All other systems reviewed and are negative.  Labs/Other Tests and Data Reviewed:    Recent Labs: 03/10/2019: NT-Pro BNP 130 09/04/2019: ALT 21; TSH 1.78 01/27/2020: B Natriuretic Peptide 21.9; BUN 10; Creatinine, Ser 0.86; Hemoglobin 12.8; Platelets 260; Potassium 3.6; Sodium 141   Recent Lipid Panel Lab Results  Component Value Date/Time   CHOL 116 09/04/2019 08:03 AM   CHOL 193 10/01/2017 11:50 AM   TRIG 74 09/04/2019 08:03 AM   HDL 54 09/04/2019 08:03 AM   HDL 51 10/01/2017 11:50 AM   CHOLHDL 2.1 09/04/2019 08:03 AM   LDLCALC 47 09/04/2019 08:03 AM    Wt Readings from Last 3 Encounters:  02/02/20 290 lb (131.5 kg)  01/27/20 300 lb (136.1 kg)  12/30/19 293 lb (132.9 kg)     Objective:    Vital Signs:  BP (!) 170/110   Pulse 80   Ht 5\' 11"  (1.803 m)   Wt 290 lb (131.5 kg)   LMP 10/04/2014   BMI 40.45 kg/m     Well nourished, well developed female in no acute distress. Well appearing, alert and conversant, regular work of breathing,  good skin color  Eyes- anicteric mouth- oral mucosa is pink  neuro- grossly intact skin- no apparent rash or lesions or cyanosis   ASSESSMENT & PLAN:    1. OSA - -she was noncompliant because she was having problems tolerating a mask -I explained how important it is to use the device -she had to turn her device back in  -she is not a candidate for the Inspire device due to morbid obesity -I have recommended a repeat split night sleep study using a nasal pillow mask  2.  Chronic diastolic CHF -she has  continues to have chronic LE edema and hand edema that intermittently increases likely related to poor compliance with low Na diet -I encouraged her to avoid any added salt and follow a strict <2gm Na diet -continue Lasix 20mg  daily and take an extra dose when needed for increased edema  3.  HTN -Bp remains poorly controlled but just took her BP meds -likely being driven by poor compliance with low Na diet -continue Losartan 100mg  daily and  Hydralazine to 100mg  TID  -change Metoprolol to carvedilol 12.5mg  BID -if BP remains elevated then change Losartan to a more potent ARB and add diuretic -check BP daily at lunch for a week and call with results -I will get a renal doppler to rule out renal artery stenosis -check 24 hour UA for catecholamines -followup in 2 weeks in HTN clinic with PharmD  4.  Palpitations -Event monitor was normal -still intermittently has some palpitations and did not have any while on heart monitor -she got a [employer] device and says at time it will say irregular -I encouraged her to send me the strips to review  5.  Pulmonary HTN -mildly elevated and likely related to G2DD  -continue diuretics -repeat echo 03/2020 -home sleep study showed severe OSA  Time:   Today, I have spent 20 minutes on telemedicine discussing medical problems including CP, SOB, CHF, palpitations, OSA and reviewing patient's chart including  2D echo, sleep study and PAP compliance download  Medication Adjustments/Labs and Tests Ordered: Current medicines are reviewed at length with the patient today.  Concerns regarding medicines are outlined above.  Tests Ordered: No orders of the defined types were placed in this encounter.  Medication Changes: No orders of the defined types were placed in this encounter.   Disposition:  Follow up 6 months  Signed, Fannie Gathright  Radford Pax, MD  02/02/2020 7:56 AM    [place]

## 2020-02-02 NOTE — Telephone Encounter (Signed)
Auth # [id number], submitted through [facility] website

## 2020-02-02 NOTE — Patient Instructions (Signed)
Please take your blood pressure daily for the next week and call with a list of your readings.  Medication Instructions:  Your physician has recommended you make the following change in your medication:  1) STOP taking metoprolol 2) START taking carvedilol 12.5mg  twice daily  *If you need a refill on your cardiac medications before your next appointment, please call your pharmacy*   Lab Work: 24 hour urine analysis  If you have labs (blood work) drawn today and your tests are completely normal, you will receive your results only by: Marland Kitchen MyChart Message (if you have MyChart) OR . A paper copy in the mail If you have any lab test that is abnormal or we need to change your treatment, we will call you to review the results.   Testing/Procedures: Your physician has requested that you have a renal artery duplex. During this test, an ultrasound is used to evaluate blood flow to the kidneys. Allow one hour for this exam. Do not eat after midnight the day before and avoid carbonated beverages. Take your medications as you usually do.  Your physician has recommended that you have a sleep study. This test records several body functions during sleep, including: brain activity, eye movement, oxygen and carbon dioxide blood levels, heart rate and rhythm, breathing rate and rhythm, the flow of air through your mouth and nose, snoring, body muscle movements, and chest and belly movement.  Follow-Up: At [facility], you and your health needs are our priority.  As part of our continuing mission to provide you with exceptional heart care, we have created designated Provider Care Teams.  These Care Teams include your primary Cardiologist (physician) and Advanced Practice Providers (APPs -  Physician Assistants and Nurse Practitioners) who all work together to provide you with the care you need, when you need it.  Your next appointment:   3 month(s)  The format for your next appointment:   In  Person  Provider:   You may see Fransico Him, MD or one of the following Advanced Practice Providers on your designated Care Team:    Melina Copa, PA-C  Ermalinda Barrios, PA-C

## 2020-02-03 ENCOUNTER — Other Ambulatory Visit: Payer: 59

## 2020-02-04 ENCOUNTER — Telehealth: Payer: Self-pay | Admitting: Cardiology

## 2020-02-04 DIAGNOSIS — I10 Essential (primary) hypertension: Secondary | ICD-10-CM

## 2020-02-04 NOTE — Telephone Encounter (Signed)
Left message for pt to call.

## 2020-02-04 NOTE — Telephone Encounter (Signed)
Spoke with pt, she took her first carvedilol yesterday and she developed a sharpness in her chest. She did not take the evening dose or this mornings dose. This morning she continues to have the sharpness in her chest and numbness in her arm. She also has a headache. Her bp this morning is 165/90 and pulse of 80 bpm. She reports she was unable to function at work on the carvedilol. Reassurance given to the patient it dies not sound like heart pain and she will try taking some tums. She will try taking the carvedilol again but will start with 1/2 tablet twice daily for 2 to 3 days and then increase. If at that point she is unable to tolerate she will let us know. Pt agreed with this plan. Will forward to dr turner to review.

## 2020-02-04 NOTE — Telephone Encounter (Signed)
Change Losartan to Avalide 300/25mg  daily and get a BMET in 1 week and check BP daily at lunch for a week and call with results.

## 2020-02-04 NOTE — Telephone Encounter (Signed)
Auth updated, new tracking # [id number]

## 2020-02-04 NOTE — Telephone Encounter (Signed)
Pt c/o medication issue:  1. Name of Medication: Carvedilol   2. How are you currently taking this medication (dosage and times per day)? Supposed to take it 2 times a day, only took it 1 yesterday  3. Are you having a reaction (difficulty breathing--STAT)?  4. What is your medication issue? *yesterday- she was short of breath, dizzy, lightheaded, disoriented, headache, numbness in left arm and chest pain   This morning  She is having the following:  Pt c/o of Chest Pain: STAT if CP now or developed within 24 hours  1. Are you having CP right now?  Dull chest pain  2. Are you experiencing any other symptoms (ex. SOB, nausea, vomiting, sweating)? Slight headache  3. How long have you been experiencing CP? yesterday  4. Is your CP continuous or coming and going? constant  5. Have you taken Nitroglycerin?no ?

## 2020-02-05 ENCOUNTER — Other Ambulatory Visit: Payer: Self-pay

## 2020-02-05 ENCOUNTER — Telehealth: Payer: Self-pay | Admitting: *Deleted

## 2020-02-05 ENCOUNTER — Other Ambulatory Visit: Payer: 59 | Admitting: *Deleted

## 2020-02-05 DIAGNOSIS — G4733 Obstructive sleep apnea (adult) (pediatric): Secondary | ICD-10-CM

## 2020-02-05 DIAGNOSIS — I10 Essential (primary) hypertension: Secondary | ICD-10-CM

## 2020-02-05 MED ORDER — IRBESARTAN-HYDROCHLOROTHIAZIDE 300-12.5 MG PO TABS: 1.0000 | ORAL_TABLET | Freq: Every day | ORAL | 3 refills | Status: DC

## 2020-02-05 NOTE — Telephone Encounter (Signed)
-----   Message from Antonieta Iba, RN sent at 02/02/2020 10:55 AM EST ----- Split night sleep study has been ordered. Study needs to be done with nasal pillow mask.  Thanks!

## 2020-02-05 NOTE — Telephone Encounter (Signed)
Patient came in for lab work today and I spoke with her in regards to medication changes. She states that she is going to continue with the half dose of carvedilol (6.25 mg) twice daily. She will discontinue losartan and start Avalide 300/25 mg daily. She will record her BP daily at lunch time and call us next week with her readings. She will have BMET done when she comes in to see the pharmacist 2/18.

## 2020-02-08 ENCOUNTER — Telehealth: Payer: Self-pay | Admitting: *Deleted

## 2020-02-08 ENCOUNTER — Other Ambulatory Visit: Payer: Self-pay | Admitting: Cardiology

## 2020-02-08 NOTE — Telephone Encounter (Signed)
Staff message sent to [place]. In lab sleep study denied by [facility]. Notify  Ordering provider can do peer to peer or order HST.

## 2020-02-10 NOTE — Telephone Encounter (Addendum)
Jamie [name], CMA  Jamie [name], [name] denied in lab sleep study. Notify ordering provider. Peer to peer or order HST.        From: Antonieta Iba, RN  Sent: 02/02/2020 11:01 AM EST  To: Jamie [name], CMA, Cv Div Sleep Studies   Split night sleep study has been ordered. Study needs to be done with nasal pillow mask.  Thanks!

## 2020-02-10 NOTE — Telephone Encounter (Signed)
Staff message sent to dr Radford Pax for advisement.

## 2020-02-12 LAB — CATECHOLAMINES, FRACTIONATED, URINE, 24 HOUR
Dopamine , 24H Ur: 134 ug/(24.h) (ref 0–510)
Dopamine, Rand Ur: 134 ug/L
Epinephrine, 24H Ur: 1 ug/(24.h) (ref 0–20)
Epinephrine, Rand Ur: 1 ug/L
Norepinephrine, 24H Ur: 38 ug/(24.h) (ref 0–135)
Norepinephrine, Rand Ur: 38 ug/L

## 2020-02-12 NOTE — Addendum Note (Signed)
Addended by: Freada Bergeron on: 02/12/2020 03:42 PM   Modules accepted: Orders

## 2020-02-17 LAB — METANEPHRINES, URINE, 24 HOUR
Metaneph Total, Ur: 66 ug/L
Metanephrines, 24H Ur: 66 ug/24 hr (ref 36–209)
Normetanephrine, 24H Ur: 253 ug/24 hr (ref 131–612)
Normetanephrine, Ur: 253 ug/L

## 2020-02-17 LAB — VANILLYLMANDELIC ACID, 24-HR U
VMA, 24H Ur Adult: 2.4 mg/24 hr (ref 0.0–7.5)
VMA, Urine: 2.4 mg/L

## 2020-02-18 NOTE — Telephone Encounter (Addendum)
Patient is aware and agreeable to Home Sleep Study through [facility]. Patient is scheduled for 03/08/20 at 8:30 to pick up home sleep kit and meet with Respiratory therapist at [facility]. Patient is aware that if this appointment date and time does not work for them they should contact Artis Delay directly at [phone]. Patient is aware that a sleep packet will be sent from [facility] in week. Patient is agreeable to treatment and thankful for call.

## 2020-02-19 ENCOUNTER — Other Ambulatory Visit: Payer: Self-pay

## 2020-02-19 ENCOUNTER — Ambulatory Visit (INDEPENDENT_AMBULATORY_CARE_PROVIDER_SITE_OTHER): Payer: 59 | Admitting: Pharmacist

## 2020-02-19 ENCOUNTER — Other Ambulatory Visit: Payer: 59 | Admitting: *Deleted

## 2020-02-19 VITALS — BP 130/84 | HR 62 | Wt 286.0 lb

## 2020-02-19 DIAGNOSIS — I10 Essential (primary) hypertension: Secondary | ICD-10-CM | POA: Diagnosis not present

## 2020-02-19 DIAGNOSIS — I5032 Chronic diastolic (congestive) heart failure: Secondary | ICD-10-CM

## 2020-02-19 LAB — BASIC METABOLIC PANEL
BUN/Creatinine Ratio: 18 (ref 9–23)
BUN: 16 mg/dL (ref 6–24)
CO2: 25 mmol/L (ref 20–29)
Calcium: 9.6 mg/dL (ref 8.7–10.2)
Chloride: 103 mmol/L (ref 96–106)
Creatinine, Ser: 0.89 mg/dL (ref 0.57–1.00)
GFR calc Af Amer: 83 mL/min/{1.73_m2} (ref >59)
GFR calc non Af Amer: 72 mL/min/{1.73_m2} (ref >59)
Glucose: 101 mg/dL — ABNORMAL HIGH (ref 65–99)
Potassium: 3.9 mmol/L (ref 3.5–5.2)
Sodium: 144 mmol/L (ref 134–144)

## 2020-02-19 NOTE — Progress Notes (Signed)
Patient ID: Jamie [name]                 DOB: 1960-10-10                      MRN: [id number]     HPI: Jamie [name] is a 60 y.o. female referred by Dr. Radford Pax to HTN clinic. PMH is significant for HTN, HLD, GERD, MVP, obesity, severe OSA, false positive myoview, and chest pain. Coronary CTA showed calcium score of 0, 2D echo showed normal LVF with G2DD, mild pulmonary HTN, and mildly dilated ascending aorta. At last visit with Dr Radford Pax on 02/02/20, BP was 170/110. She continued to report chronic LE edema and hand edema and was advised to follow low Na diet. Her metoprolol was changed to carvedilol. She was scheduled for repeat sleep study (pending insurance coverage), 24 hour UA for catecholamines (negative), echo (pending 3/3) and renal doppler (pending 3/2).  She called clinic 2 days later reporting SOB, dizziness, headache, numbness in her left arm and chest pain after taking 1 dose of carvedilol. She was advised to cut tablets in half and take 6.25mg  BID and was also advised to change from losartan to irbesartan-HCTZ 300-25mg  daily, however looks as though the 300-12.5mg  dose was sent in.  Pt presents today in good spirits. Reports tolerating lower dose of carvedilol much better. Still does feel a bit lightheaded/dizzy on a daily basis but overall much better, can happen when she's sitting, not necessarily with positional changes. Has had headaches about 3x in the past week in the evenings. Reports swelling in her legs is well controlled currently.  Morning BP readings run 160s/80, lunch readings run 140/80 using bicep cuff she has had for about 2 years. Pt checks AM BP around the same time she takes her meds. Hydralazine doses being taken at 8am, 2pm, and 8pm so pm dose is likely wearing off before her AM dose the next morning. All meds brought today, a few discrepancies noted. Pt is taking furosemide 20mg  daily (our med list stated 40mg ). She has also been taking 200mg  of hydralazine TID  for the past at least 3 months per her PCP. Advised pt that 100mg  is typically the highest dose we'll use and to clarify with them. Taking gabapentin prn for back pain but not very helpful. Was prescribed branded med containing ibuprofen and famotidine but cost was > $1000. Advised pt she can find each of these meds OTC for much cheaper, but to avoid taking daily oral NSAIDs if possible due to risk of elevating BP. Topical Voltaren gel works for her knee pain.  Has not taken any of her AM meds yet today, usually takes these at work. Doesn't get much sleep, typically sleeps 1-2am until 6am.  Current HTN meds:  carvedilol 12.5mg  BID - 8am and 8pm furosemide 20mg  daily - 8am hydralazine 100mg  TID - 8am, 2pm, 8pm irbesartan-HCTZ 300-12.5mg  daily - 8am  BP goal: <130/41mmHg  Family History: The patient's family history includes AAA (abdominal aortic aneurysm) in her mother; Breast cancer in her cousin; Colon cancer in her cousin; Hyperlipidemia in her brother and mother; Hypertension in her brother, father, and mother; Liver cancer in her maternal aunt; Stroke in her father. There is no history of Colon polyps, Esophageal cancer, Stomach cancer, or Rectal cancer.  Social History: Former tobacco use, quit in 1998, rarely drinks alcohol, denies drug use.  Diet: Eating more fruits and vegetables, more fish, less red meat. Uses  Mrs Deliah Boston or salt free seasonings. 1 cup of coffee, cutting back on soda and tea.  Exercise: Planning to start walking 30 minutes with better weather 5 days a week = goal. Not currently walking much.  Labs: 01/27/20: Na 141, K 3.6, SCr 0.86  Wt Readings from Last 3 Encounters:  02/02/20 290 lb (131.5 kg)  01/27/20 300 lb (136.1 kg)  12/30/19 293 lb (132.9 kg)   BP Readings from Last 3 Encounters:  02/02/20 (!) 170/110  01/27/20 136/77  01/27/20 132/81   Pulse Readings from Last 3 Encounters:  02/02/20 80  01/27/20 70  01/27/20 68    Renal function: CrCl cannot be  calculated (Patient's most recent lab result is older than the maximum 21 days allowed.).  Past Medical History:  Diagnosis Date  . Acquired dilation of ascending aorta and aortic root ([place])    56mm by 2D echo 03/2019  . Allergy   . Arthritis    in back  . Chest pain    neg cath 2016 after false positive myoview  . Depression   . GERD (gastroesophageal reflux disease)   . Hyperglycemia   . Hyperlipidemia   . Hypertension   . Joint pain   . Knee pain   . Low back pain   . Migraine   . Mitral valve prolapse   . Muscle cramps   . Pulmonary HTN ([place])    mild with PASP 81mmHg by echo 03/2019    Current Outpatient Medications on File Prior to Visit  Medication Sig Dispense Refill  . acetaminophen (TYLENOL) 500 MG tablet Take 2 tablets (1,000 mg total) by mouth at bedtime. 30 tablet 0  . Ascorbic Acid (VITAMIN C) 100 MG tablet Take 100 mg by mouth daily.    Marland Kitchen aspirin EC 81 MG tablet Take 1 tablet (81 mg total) by mouth daily.    Marland Kitchen atorvastatin (LIPITOR) 80 MG tablet TAKE 1 TABLET BY MOUTH EVERY DAY 90 tablet 1  . carvedilol (COREG) 12.5 MG tablet Take 1 tablet (12.5 mg total) by mouth 2 (two) times daily. (Patient taking differently: Take 6.25 mg by mouth 2 (two) times daily.) 180 tablet 3  . diclofenac sodium (VOLTAREN) 1 % GEL Apply 2 g topically 4 (four) times daily as needed (pain).    Marland Kitchen ELDERBERRY PO Take by mouth.    . furosemide (LASIX) 20 MG tablet Take 2 tablets (40 mg total) by mouth daily. You may take 1 extra tablet (20 mg) as needed for increased swelling 90 tablet 3  . gabapentin (NEURONTIN) 300 MG capsule Take 300-600 mg by mouth at bedtime.     . hydrALAZINE (APRESOLINE) 100 MG tablet Take 1 tablet (100 mg total) by mouth 3 (three) times daily. 270 tablet 3  . irbesartan-hydrochlorothiazide (AVALIDE) 300-12.5 MG tablet Take 1 tablet by mouth daily. 90 tablet 3  . magnesium 30 MG tablet Take 30 mg by mouth 2 (two) times daily.    . pantoprazole (PROTONIX) 40 MG tablet  Take 1 tablet (40 mg total) by mouth daily. 30 tablet 11  . pramipexole (MIRAPEX) 0.25 MG tablet Take 1 tablet (0.25 mg total) by mouth at bedtime. 30 tablet 5  . triamcinolone cream (KENALOG) 0.1 % APPLY 1 APPLICATION TOPICALLY DAILY AS NEEDED. 45 g 0  . VITAMIN D PO Take 1 tablet by mouth daily.     No current facility-administered medications on file prior to visit.    No Known Allergies   Assessment/Plan:  1. Hypertension -  BP close to goal <130/45mmHg today despite not taking any of her AM BP meds yet. Advised pt to contact her PCP regarding hydralazine dose as she has been taking 200mg  TID but typical max dose is 100mg  TID and higher dose may be contributing to her dizziness. Also changed timing of her hydralazine to be 8 hours apart (8am, 4pm, and midnight) to provide her with better 24 hour coverage. She'll continue on lower dose of carvedilol 6.25mg  BID which she is tolerating better. Checking BMET today with recent change to irbesartan-HCTZ. Of note, dose was sent in for 300-12.5mg  daily rather than ordered 300-25mg  daily, although lower thiazide dose may be better since pt is also taking furosemide 20mg  daily and fluid status is well controlled.   Encouraged pt to check BP at home once daily, about 2 hours after taking her meds. She has been working to decrease her salt intake, is down about 5 lbs today and would like to discuss weight loss at next visit, can discuss [place]gov[place]. Plans to start walking for 30 minutes 5 days a week. Provided pt with diet and exercise handout today. Scheduled f/u with PharmD in 4 weeks, she'll bring her home BP cuff and readings with her as her home readings were notably higher than clinic reading today. She'll have had sleep study, renal duplex, and echo completed by f/u visit as well.  Naydeen Speirs E. Lizann Edelman, PharmD, BCACP, [place] [address] [place],  [address] Phone: [phone]; Fax: [phone] 02/19/2020 9:31  AM

## 2020-02-19 NOTE — Patient Instructions (Addendum)
It was nice to see you today!  Your blood pressure goal is < 130/77mmHg  Take your hydralazine doses at 8am, 4pm, and midnight. Call your primary care doctor to clarify your dosing. Typically the highest dose we use is 100mg  3x a day.  Continue taking your other medications.   You can find ibuprofen and famotidine over the counter. Ibuprofen is in the pain section, try not to take this daily as it can increase your blood pressure. Famotidine is in the GI section since it helps protect your stomach  Limit your daily sodium intake to < 2,000mg  daily  Continue to check your blood pressure, once a day is fine.  Bring in your blood pressure readings and cuff to your next visit in 1 month.      Tips for living a healthier life  SUGAR  Sugar is a huge problem in the modern day diet. Sugar is a big contributor to heart disease, diabetes, high triglyceride levels, fatty liver disease and obesity. Sugar is hidden in almost all packaged foods/beverages. Added sugar is extra sugar that is added beyond what is naturally found and has no nutritional benefit for your body. The American Heart Association recommends limiting added sugars to no more than 25g for women and 36 grams for men per day. There are many names for sugar including maltose, sucrose (names ending in "ose"), high fructose corn syrup, molasses, cane sugar, corn sweetener, raw sugar, syrup, honey or fruit juice concentrate.   One of the best ways to limit your added sugars is to stop drinking sweetened beverages such as soda, sweet tea, and fruit juice. There is 65g of added sugars in one 20oz bottle of Coke! That is equal to 6 donuts.   Pay attention and read all nutrition facts labels. Below is an examples of a nutrition facts label. The #1 is showing you the total sugars where the # 2 is showing you the added sugars. This one serving has almost the max amount of added sugars per day!     EXERCISE  Exercise is good. We've all  heard that. In an ideal world, we would all have time and resources to get plenty of it. When you are active, your heart pumps more efficiently and you will feel better.  Multiple studies show that even walking regularly has benefits that include living a longer life. The American Heart Association recommends 150 minutes per week of exercise (30 minutes per day most days of the week). You can do this in any increment you wish. Nine or more 10-minute walks count. So does an hour-long exercise class. Break the time apart into what will work in your life. Some of the best things you can do include walking briskly, jogging, cycling or swimming laps. Not everyone is ready to "exercise." Sometimes we need to start with just getting active. Here are some easy ways to be more active throughout the day: Marland Kitchen Take the stairs instead of the elevator . Go for a 10-15 minute walk during your lunch break (find a friend to make it more enjoyable) . When shopping, park at the back of the parking lot . If you take public transportation, get off one stop early and walk the extra distance . Pace around while making phone calls  Check with your doctor if you aren't sure what your limitations may be. Always remember to drink plenty of water when doing any type of exercise. Don't feel like a failure if you're not getting the  90-150 minutes per week. If you started by being a couch potato, then just a 10-minute walk each day is a huge improvement. Start with little victories and work your way up.   HEALTHY EATING TIPS  When looking to improve your eating habits, whether to lose weight, lower blood pressure or just be healthier, it helps to know what a serving size is.   Grains 1 slice of bread,  bagel,  cup pasta or rice  Vegetables 1 cup fresh or raw vegetables,  cup cooked or canned Fruits 1 piece of medium sized fruit,  cup canned,   Meats/Proteins  cup dried       1 oz meat, 1 egg,  cup cooked beans, nuts or  seeds  Dairy        Fats Individual yogurt container, 1 cup (8oz)    1 teaspoon margarine/butter or vegetable  milk or milk alternative, 1 slice of cheese          oil; 1 tablespoon mayonnaise or salad dressing                  Plan ahead: make a menu of the meals for a week then create a grocery list to go with that menu. Consider meals that easily stretch into a night of leftovers, such as stews or casseroles. Or consider making two of your favorite meal and put one in the freezer for another night. Try a night or two each week that is "meatless" or "no cook" such as salads. When you get home from the grocery store wash and prepare your vegetables and fruits. Then when you need them they are ready to go.   Tips for going to the grocery store: . Buy store or generic brands . Check the weekly ad from your store on-line or in their in-store flyer . Look at the unit price on the shelf tag to compare/contrast the costs of different items . Buy fruits/vegetables in season . Carrots, bananas and apples are low-cost, naturally healthy items . If meats or frozen vegetables are on sale, buy some extras and put in your freezer . Limit buying prepared or "ready to eat" items, even if they are pre-made salads or fruit snacks . Do not shop when you're hungry . Foods at eye level tend to be more expensive. Look on the high and low shelves for deals. . Consider shopping at the farmer's market for fresh foods in season. Marland Kitchen Avoid the cookie and chip aisles (these are expensive, high in calories and low in nutritional value). Shop on the outside of the grocery store.  Healthy food preparations: . If you can't get lean hamburger, be sure to drain the fat when cooking . Steam, saut (in olive oil), grill or bake foods . Experiment with different seasonings to avoid adding salt to your foods. Kosher salt, sea salt and Himalayan salt are all still salt and should be avoided. Try seasoning food with onion, garlic,  thyme, rosemary, basil ect. Onion powder or garlic powder is ok. Avoid if it says salt (ie garlic salt).        Other resources: American Heart Association - InstantFinish.fi         Go to the Healthy Living tab to get more information American Diabetes Association - www.diabetes.org         You don't have to be diabetic - check out the Food and Fitness tab

## 2020-02-29 ENCOUNTER — Other Ambulatory Visit: Payer: Self-pay | Admitting: Internal Medicine

## 2020-02-29 DIAGNOSIS — I5032 Chronic diastolic (congestive) heart failure: Secondary | ICD-10-CM

## 2020-02-29 DIAGNOSIS — I10 Essential (primary) hypertension: Secondary | ICD-10-CM

## 2020-03-02 ENCOUNTER — Other Ambulatory Visit: Payer: Self-pay | Admitting: Cardiology

## 2020-03-02 ENCOUNTER — Other Ambulatory Visit: Payer: Self-pay

## 2020-03-02 ENCOUNTER — Ambulatory Visit (HOSPITAL_COMMUNITY)
Admission: RE | Admit: 2020-03-02 | Discharge: 2020-03-02 | Disposition: A | Discharge status: Home / Self Care | Payer: 59 | Source: Ambulatory Visit | Attending: Cardiology | Admitting: Cardiology

## 2020-03-02 DIAGNOSIS — I10 Essential (primary) hypertension: Secondary | ICD-10-CM

## 2020-03-03 ENCOUNTER — Ambulatory Visit (HOSPITAL_COMMUNITY): Payer: 59 | Attending: Cardiology

## 2020-03-03 DIAGNOSIS — I272 Pulmonary hypertension, unspecified: Secondary | ICD-10-CM | POA: Insufficient documentation

## 2020-03-03 LAB — ECHOCARDIOGRAM COMPLETE
Area-P 1/2: 2.82 cm2
S' Lateral: 2.5 cm

## 2020-03-03 MED ORDER — PERFLUTREN LIPID MICROSPHERE
1.0000 mL | INTRAVENOUS | Status: AC | PRN
  Administered 2020-03-03: 2 mL via INTRAVENOUS

## 2020-03-04 ENCOUNTER — Encounter: Payer: Self-pay | Admitting: Cardiology

## 2020-03-04 DIAGNOSIS — I359 Nonrheumatic aortic valve disorder, unspecified: Secondary | ICD-10-CM | POA: Insufficient documentation

## 2020-03-08 ENCOUNTER — Ambulatory Visit (HOSPITAL_BASED_OUTPATIENT_CLINIC_OR_DEPARTMENT_OTHER): Payer: 59 | Attending: Cardiology | Admitting: Cardiology

## 2020-03-08 ENCOUNTER — Other Ambulatory Visit: Payer: Self-pay

## 2020-03-08 DIAGNOSIS — G4733 Obstructive sleep apnea (adult) (pediatric): Secondary | ICD-10-CM

## 2020-03-08 DIAGNOSIS — R0902 Hypoxemia: Secondary | ICD-10-CM | POA: Insufficient documentation

## 2020-03-08 DIAGNOSIS — G4736 Sleep related hypoventilation in conditions classified elsewhere: Secondary | ICD-10-CM | POA: Insufficient documentation

## 2020-03-10 ENCOUNTER — Telehealth: Payer: Self-pay | Admitting: *Deleted

## 2020-03-10 DIAGNOSIS — G4733 Obstructive sleep apnea (adult) (pediatric): Secondary | ICD-10-CM

## 2020-03-10 NOTE — Telephone Encounter (Signed)
Informed patient of sleep study results and patient understanding was verbalized. Patient understands her sleep study showed they have sleep apnea and recommend CPAP titration. Please set up titration in the sleep lab.  Pt is aware and agreeable to her results.  Titration sent to sleep pool 

## 2020-03-10 NOTE — Telephone Encounter (Signed)
-----   Message from Sueanne Margarita, MD sent at 03/10/2020 12:28 PM EST ----- Please let patient know that they have sleep apnea and recommend CPAP titration. Please set up titration in the sleep lab.

## 2020-03-10 NOTE — Procedures (Signed)
   Patient Name: Jamie [name], Jamie [name] Date: 03/08/2020 Gender: Female D.O.B: [birth year] Age (years): 29 Referring Provider: Fransico Him MD, ABSM Height (inches): 70 Interpreting Physician: Fransico Him MD, ABSM Weight (lbs): 289 RPSGT: Jacolyn Reedy BMI: 41 MRN: [id number] Neck Size: 14.00  CLINICAL INFORMATION Sleep Study Type: HST  Indication for sleep study: N/A  Epworth Sleepiness Score: 6  SLEEP STUDY TECHNIQUE A multi-channel overnight portable sleep study was performed. The channels recorded were: nasal airflow, thoracic respiratory movement, and oxygen saturation with a pulse oximetry. Snoring was also monitored.  MEDICATIONS Patient self administered medications include: N/A.  SLEEP ARCHITECTURE Patient was studied for 378.8 minutes. The sleep efficiency was 99.9 % and the patient was supine for 0%. The arousal index was 0.0 per hour.  RESPIRATORY PARAMETERS The overall AHI was 36.0 per hour, with a central apnea index of 0 per hour.  The oxygen nadir was 78% during sleep.  CARDIAC DATA Mean heart rate during sleep was 71.9 bpm.  IMPRESSIONS - Severe obstructive sleep apnea occurred during this study (AHI = 36.0/h). - Severe oxygen desaturation was noted during this study (Min O2 = 78%). - Patient snored 0.2% during the sleep.  DIAGNOSIS - Obstructive Sleep Apnea (G47.33) - Nocturnal Hypoxemia (G47.36)  RECOMMENDATIONS - Recomend in lab CPAP titration - Avoid alcohol, sedatives and other CNS depressants that may worsen sleep apnea and disrupt normal sleep architecture. - Sleep hygiene should be reviewed to assess factors that may improve sleep quality. - Weight management and regular exercise should be initiated or continued.  [Electronically signed] 03/10/2020 12:25 PM  Fransico Him MD, ABSM Diplomate, American Board of Sleep Medicine

## 2020-03-11 ENCOUNTER — Telehealth: Payer: Self-pay | Admitting: *Deleted

## 2020-03-11 NOTE — Telephone Encounter (Signed)
PA for CPAP titration submitted to [facility] via web portal.

## 2020-03-14 ENCOUNTER — Telehealth: Payer: Self-pay | Admitting: *Deleted

## 2020-03-14 NOTE — Telephone Encounter (Signed)
Staff message sent to Jamie [name] received a call from [facility] denying titration sleep study. Recommends APAP.

## 2020-03-17 NOTE — Telephone Encounter (Signed)
Sueanne Margarita, MD  Freada Bergeron, CMA ResMed auto CPAP from 4 to 15cm H2O with heated humidity and mask of choice         ----- Message -----  From: Freada Bergeron, CMA  Sent: 03/16/2020  3:14 PM EDT  To: Sueanne Margarita, MD  Subject: FW: PRECERT                    Please write for APAP orders.  ----- Message -----  From: Lauralee Evener, CMA  Sent: 03/14/2020  3:18 PM EDT  To: Freada Bergeron, CMA  Subject: RE: Eual Fines                    Received a call from [facility] denying Titration study. Recommended APAP.

## 2020-03-17 NOTE — Telephone Encounter (Signed)
Upon patient request DME selection is [facility] Patient understands he will be contacted by [facility] to set up his cpap. Patient understands to call if [facility] does not contact him with new setup in a timely manner. Patient understands they will be called once confirmation has been received from [facility] that they have received their new machine to schedule 10 week follow up appointment.   [facility] notified of new cpap order  Please add to airview Patient was grateful for the call and thanked me  

## 2020-03-17 NOTE — Addendum Note (Signed)
Addended by: Freada Bergeron on: 03/17/2020 06:11 PM   Modules accepted: Orders

## 2020-03-22 ENCOUNTER — Ambulatory Visit (INDEPENDENT_AMBULATORY_CARE_PROVIDER_SITE_OTHER): Payer: 59 | Admitting: Pharmacist

## 2020-03-22 ENCOUNTER — Other Ambulatory Visit: Payer: Self-pay

## 2020-03-22 VITALS — BP 126/84 | HR 66 | Wt 291.0 lb

## 2020-03-22 DIAGNOSIS — I10 Essential (primary) hypertension: Secondary | ICD-10-CM | POA: Diagnosis not present

## 2020-03-22 LAB — BASIC METABOLIC PANEL
BUN/Creatinine Ratio: 13 (ref 9–23)
BUN: 12 mg/dL (ref 6–24)
CO2: 24 mmol/L (ref 20–29)
Calcium: 9.1 mg/dL (ref 8.7–10.2)
Chloride: 104 mmol/L (ref 96–106)
Creatinine, Ser: 0.91 mg/dL (ref 0.57–1.00)
Glucose: 104 mg/dL — ABNORMAL HIGH (ref 65–99)
Potassium: 4.1 mmol/L (ref 3.5–5.2)
Sodium: 143 mmol/L (ref 134–144)
eGFR: 73 mL/min/{1.73_m2} (ref >59)

## 2020-03-22 MED ORDER — SEMAGLUTIDE-WEIGHT MANAGEMENT 0.5 MG/0.5ML ~~LOC~~ SOAJ: 0.5000 mg | SUBCUTANEOUS | 0 refills | Status: AC

## 2020-03-22 MED ORDER — SEMAGLUTIDE-WEIGHT MANAGEMENT 0.25 MG/0.5ML ~~LOC~~ SOAJ: 0.2500 mg | SUBCUTANEOUS | 0 refills | Status: AC

## 2020-03-22 MED ORDER — SEMAGLUTIDE-WEIGHT MANAGEMENT 1.7 MG/0.75ML ~~LOC~~ SOAJ: 1.7000 mg | SUBCUTANEOUS | 0 refills | Status: DC

## 2020-03-22 MED ORDER — SEMAGLUTIDE-WEIGHT MANAGEMENT 1 MG/0.5ML ~~LOC~~ SOAJ: 1.0000 mg | SUBCUTANEOUS | 0 refills | Status: AC

## 2020-03-22 MED ORDER — SEMAGLUTIDE-WEIGHT MANAGEMENT 2.4 MG/0.75ML ~~LOC~~ SOAJ: 2.4000 mg | SUBCUTANEOUS | 5 refills | Status: DC

## 2020-03-22 MED ORDER — CARVEDILOL 12.5 MG PO TABS: ORAL_TABLET | ORAL | 3 refills | Status: DC

## 2020-03-22 NOTE — Progress Notes (Signed)
Patient ID: Jamie [name]                 DOB: Dec 20, 1960                      MRN: [id number]     HPI: Jecenia Leamer is a 60 y.o. female referred by Dr. Radford Pax to HTN clinic. PMH is significant for HTN, HLD, GERD, MVP, obesity, severe OSA, false positive myoview, and chest pain. Coronary CTA 03/03/19 showed calcium score of 0, 2D echo showed normal LVF with G2DD, mild pulmonary HTN, and mildly dilated ascending aorta. Chest CT 01/27/20 did show aortic atherosclerosis. At last visit with Dr Radford Pax on 02/02/20, BP was 170/110. She continued to report chronic LE edema and hand edema and was advised to follow low Na diet. Her metoprolol was changed to carvedilol. She was scheduled for repeat sleep study (pending insurance coverage), 24 hour UA for catecholamines (negative), echo which revealed EF 50-55% with mildly thickened bicuspid AV, and renal artery ultrasound which revealed normal right renal artery and 1-59% left renal artery stenosis which was non-obstructive and not likely to cause HTN.  She called clinic 2 days later reporting SOB, dizziness, headache, numbness in her left arm and chest pain after taking 1 dose of carvedilol. She was advised to cut tablets in half and take 6.25mg  BID and was also advised to change from losartan to irbesartan-HCTZ 300-25mg  daily, however looks as though the 300-12.5mg  dose was sent in. At visit with me on 02/19/20, BP was very close to goal despite pt not taking AM BP meds prior to visit. She was tolerating lower dose of carvedilol better. She was advised to follow up with PCP regarding hydralazine dose (believed they were prescribing 200mg  TID which is higher than normal max dose) and was advised to separate doses by 8 hours.  Pt presents today in good spirits. She self increased her carvedilol to carvedilol 12.5mg  in the evening and has continued on 6.25mg  in the AM, tolerating well. Tolerating other meds well. Decreased hydralazine dose to 100mg  TID. Has noticed  cramping quite a bit. Takes OTC potassium supplementation. Has some numbness in her toe, is seeing MD later this morning about it. Still reports dizziness typically w hen she bends down. Has been going on for a few years, states she has discussed with her PCP, unsure of cause. Reports mild stable swelling in her legs. Has been checking BP in the AM at the same time she takes her meds. Home cuff brought to clinic today, measuring ~[id number] higher systolic than clinic reading. Home bicep cuff: 142/84, 145/94 on recheck. Manual clinic reading: 126/84, 134/82 on recheck. Hasn't taken any AM meds yet (takes 8am doses at work). Insurance denied sleep study, recommended APAP. Hasn't started walking yet but plans to.   Current HTN meds:  carvedilol 6.25mg  AM and 12.5mg  PM - 8am and 8pm furosemide 20mg  daily - 8am hydralazine 100mg  TID - 8am, 4pm, 12am irbesartan-HCTZ 300-12.5mg  daily - 8am  Previously tried: - Carvedilol 12.5mg  BID -SOB, dizziness, headache, numbness in her left arm and chest pain after 1 dose - Amlodipine - LEE  BP goal: <130/32mmHg  Family History: The patient's family history includes AAA (abdominal aortic aneurysm) in her mother; Breast cancer in her cousin; Colon cancer in her cousin; Hyperlipidemia in her brother and mother; Hypertension in her brother, father, and mother; Liver cancer in her maternal aunt; Stroke in her father. There is no history of Colon  polyps, Esophageal cancer, Stomach cancer, or Rectal cancer.  Social History: Former tobacco use, quit in 1998, rarely drinks alcohol, denies drug use.  Diet: Eating more fruits and vegetables, more fish, less red meat. Uses Mrs Deliah Boston or salt free seasonings. 1 cup of coffee, cutting back on soda and tea.  Exercise: Planning to start walking 30 minutes with better weather 5 days a week = goal. Not currently walking much.  Home BP readings:  127/77, 115/66, 125/70, 136/78, 144/82, 149/85, 129/78, 147/85, 115/90, 98/88, 132/78,  130/75, 123/82, 136/75, 119/76, 133/78, 136/81, 129/75, 126/77, 132/74. HR 60s-80. BP checked before taking BP meds   Labs: 01/27/20: Na 141, K 3.6, SCr 0.86  Wt Readings from Last 3 Encounters:  03/08/20 289 lb (131.1 kg)  02/19/20 286 lb (129.7 kg)  02/02/20 290 lb (131.5 kg)   BP Readings from Last 3 Encounters:  02/19/20 130/84  02/02/20 (!) 170/110  01/27/20 136/77   Pulse Readings from Last 3 Encounters:  02/19/20 62  02/02/20 80  01/27/20 70    Renal function: CrCl cannot be calculated (Patient's most recent lab result is older than the maximum 21 days allowed.).  Past Medical History:  Diagnosis Date  . Acquired dilation of ascending aorta and aortic root ([place])    84mm by 2D echo 03/2019  . Allergy   . Aortic valve disease    functionally bicuspid AV with fusion of the right and left coronary cusps with no AS by echo 03/2020  . Arthritis    in back  . Chest pain    neg cath 2016 after false positive myoview  . Depression   . GERD (gastroesophageal reflux disease)   . Hyperglycemia   . Hyperlipidemia   . Hypertension   . Joint pain   . Knee pain   . Low back pain   . Migraine   . Mitral valve prolapse   . Muscle cramps   . Pulmonary HTN ([place])    mild with PASP 56mmHg by echo 03/2019 but 19mmHg on echo 03/2020    Current Outpatient Medications on File Prior to Visit  Medication Sig Dispense Refill  . acetaminophen (TYLENOL) 500 MG tablet Take 2 tablets (1,000 mg total) by mouth at bedtime. 30 tablet 0  . aspirin EC 81 MG tablet Take 1 tablet (81 mg total) by mouth daily.    Marland Kitchen atorvastatin (LIPITOR) 80 MG tablet TAKE 1 TABLET BY MOUTH EVERY DAY 90 tablet 1  . carvedilol (COREG) 12.5 MG tablet Take 0.5 tablets (6.25 mg total) by mouth 2 (two) times daily.    . Cholecalciferol (VITAMIN D3) 50 MCG (2000 UT) capsule Take 2,000 Units by mouth daily.    . diclofenac sodium (VOLTAREN) 1 % GEL Apply 2 g topically 4 (four) times daily as needed (pain).    Marland Kitchen ELDERBERRY  PO Take by mouth.    . furosemide (LASIX) 20 MG tablet Take 1 tablet (20 mg total) by mouth daily. You may take 1 extra tablet (20 mg) as needed for increased swelling    . gabapentin (NEURONTIN) 300 MG capsule Take 300-600 mg by mouth as needed.    . hydrALAZINE (APRESOLINE) 100 MG tablet Take 1 tablet (100 mg total) by mouth 3 (three) times daily. 270 tablet 3  . irbesartan-hydrochlorothiazide (AVALIDE) 300-12.5 MG tablet Take 1 tablet by mouth daily. 90 tablet 3  . magnesium 30 MG tablet Take 30 mg by mouth 2 (two) times daily.    . Melatonin 5 MG CAPS  Take 1 capsule by mouth at bedtime as needed.    . Misc Natural Products (OSTEO BI-FLEX ADV TRIPLE ST PO) Take 1 tablet by mouth 2 (two) times daily. Glucosamine and turmeric    . pantoprazole (PROTONIX) 40 MG tablet TAKE 1 TABLET BY MOUTH EVERY DAY 90 tablet 3  . Potassium 99 MG TABS Take 1 tablet by mouth daily.    . pramipexole (MIRAPEX) 0.25 MG tablet Take 1 tablet (0.25 mg total) by mouth at bedtime. 30 tablet 5  . triamcinolone cream (KENALOG) 0.1 % APPLY 1 APPLICATION TOPICALLY DAILY AS NEEDED. 45 g 0   No current facility-administered medications on file prior to visit.    No Known Allergies   Assessment/Plan:  1. Hypertension - BP close to goal <130/63mmHg today despite not taking any of her AM BP meds yet.  Continue carvedilol 6.25mg  AM and 12.5mg  PM, hydralazine 100mg  TID, irbesartan-HCTZ 300-12.5mg  daily, and furosemide 20mg  daily. Checking BMET today due to pt reports of cramping (does take OTC K supplement). Pt advised to look for new BP cuff as her home cuff is overestimating SBP by ~92mmHg. Also encouraged pt to check BP a few hours after taking her meds instead of at the same time.   2. Obesity - Discussed weight loss today including Wegovy which pt is interested in trying. No family hx of thyroid cancer. Prior authorization submitted through insurance after pt's visit which was approved, copay only $25 at pharmacy. Detailed  dosing instructions sent to pt via MyChart. Pt also encouraged to start walking 5 days per week and focus on heart healthy diet.  Pt has follow up with Dr Radford Pax in ~ 6 weeks. Can follow up with pt after as needed.  Megan E. Supple, PharmD, BCACP, [facility] [address] [place], [place] [address] Phone: [phone]; Fax: [phone] 03/22/2020 7:14 AM

## 2020-04-18 LAB — HM PAP SMEAR: HM Pap smear: NORMAL

## 2020-04-27 ENCOUNTER — Encounter: Payer: Self-pay | Admitting: Internal Medicine

## 2020-05-09 ENCOUNTER — Other Ambulatory Visit: Payer: Self-pay

## 2020-05-09 ENCOUNTER — Ambulatory Visit (INDEPENDENT_AMBULATORY_CARE_PROVIDER_SITE_OTHER): Payer: 59 | Admitting: Cardiology

## 2020-05-09 ENCOUNTER — Encounter: Payer: Self-pay | Admitting: Cardiology

## 2020-05-09 ENCOUNTER — Telehealth: Payer: Self-pay | Admitting: Pharmacist

## 2020-05-09 VITALS — BP 132/88 | HR 73 | Ht 70.0 in | Wt 290.6 lb

## 2020-05-09 DIAGNOSIS — I5032 Chronic diastolic (congestive) heart failure: Secondary | ICD-10-CM | POA: Diagnosis not present

## 2020-05-09 DIAGNOSIS — G4733 Obstructive sleep apnea (adult) (pediatric): Secondary | ICD-10-CM

## 2020-05-09 DIAGNOSIS — I272 Pulmonary hypertension, unspecified: Secondary | ICD-10-CM

## 2020-05-09 DIAGNOSIS — R002 Palpitations: Secondary | ICD-10-CM | POA: Diagnosis not present

## 2020-05-09 DIAGNOSIS — I10 Essential (primary) hypertension: Secondary | ICD-10-CM | POA: Diagnosis not present

## 2020-05-09 DIAGNOSIS — E785 Hyperlipidemia, unspecified: Secondary | ICD-10-CM

## 2020-05-09 MED ORDER — IRBESARTAN-HYDROCHLOROTHIAZIDE 300-12.5 MG PO TABS: 1.0000 | ORAL_TABLET | Freq: Every day | ORAL | 3 refills | Status: DC

## 2020-05-09 MED ORDER — FUROSEMIDE 20 MG PO TABS: 20.0000 mg | ORAL_TABLET | Freq: Every day | ORAL | 3 refills | Status: DC

## 2020-05-09 MED ORDER — CARVEDILOL 12.5 MG PO TABS: ORAL_TABLET | ORAL | 3 refills | Status: DC

## 2020-05-09 MED ORDER — ATORVASTATIN CALCIUM 80 MG PO TABS: 1.0000 | ORAL_TABLET | Freq: Every day | ORAL | 3 refills | Status: DC

## 2020-05-09 MED ORDER — HYDRALAZINE HCL 100 MG PO TABS: 100.0000 mg | ORAL_TABLET | Freq: Three times a day (TID) | ORAL | 3 refills | Status: DC

## 2020-05-09 NOTE — Addendum Note (Signed)
Addended by: Antonieta Iba on: 05/09/2020 08:35 AM   Modules accepted: Orders

## 2020-05-09 NOTE — Telephone Encounter (Signed)
Called pt to follow up with [facility]. Pt states she didn't start on injections because she read about the side effects and was scared of the cancer risk. Discussed that the warning for thyroid cancer comes from data seen in mouse studies but has not been shown in humans and that the warning is listed because of this. She has Wegovy at home in her fridge and is agreeable to start on injections. Will call pt in 1 month to follow up with tolerability and dose increase.

## 2020-05-09 NOTE — Patient Instructions (Signed)

## 2020-05-09 NOTE — Progress Notes (Signed)
Date:  05/09/2020   ID:  Jamie [name], DOB Aug 24, 1960, MRN [id number]   PCP:  Isaac Bliss, Rayford Halsted, MD  Cardiologist:  Fransico Him, MD  Electrophysiologist:  None   Chief Complaint:  Chest pain and SOB  History of Present Illness:    Jamie [name] is a 60 y.o. female  with a hx of CP in the past with false positive myoview and normal coronary arteries in 2016.  She has a hx of GERD, HTN, HLD and MVP by remote echo.  She saw me in Jan 2021 with recurrent CP and DOE.  2D echo Aug 2020 was normal with G1DD.  CP was atypical (sharp and stabbing) and belching improved her sx but sometimes would radiate into her left arm with tingling.  She also complained of some palpitations.  She underwent coronary CTA showing a Ca++ score of 0 and no CAD.  Repeat 2D echo showed normal LVF with G2DD and mild pulmonary HTN with PASP 15mmHg and mildly dilated ascending aorta at 83mm.   Pro-BNP was normal. Her renal dopplers in 03/2020 showed 1-59% left RAS.  She also has OSA and a sleep study showed severe OSA with an AHI of 72/hr and was started on auto CPAP.  Unfortunately she stopped using her device at last OV because she said it hurt her teeth.  Unfortunately she never followup with getting a new mask and due to noncompliance her insurance company made her hand it back in.    She is here today for followup and is doing well.  She tells me that it rained really hard on Friday and she got a panic attack and had some chest pain because she was on the highway and could not see while driving.  She went to bed to calm herself down and it went away.  She denies any other exertional chest pain or pressure, SOB, DOE, PND, orthopnea,   palpitations or syncope. She continues to have chronic LE edema and tries to watch her Na intake.  She stills has some feelings of being off balance when taking a shower. She is compliant with her meds and is tolerating meds with no SE.    She recently underwent sleep study to  get a new device as she lost her other one due to noncompliant. Sleep study showed severe OSA with an AHI of 36/hr with nocturnal hypoxemia with lowest O2sat 78%.  She is getting her PAP device on May 23rd.    Prior CV studies:   The following studies were reviewed today:  2D echo, sleep study, PAP compliance download  Past Medical History:  Diagnosis Date  . Acquired dilation of ascending aorta and aortic root ([place])    27mm by 2D echo 03/2019  . Allergy   . Aortic valve disease    functionally bicuspid AV with fusion of the right and left coronary cusps with no AS by echo 03/2020  . Arthritis    in back  . Chest pain    neg cath 2016 after false positive myoview  . Depression   . GERD (gastroesophageal reflux disease)   . Hyperglycemia   . Hyperlipidemia   . Hypertension   . Joint pain   . Knee pain   . Low back pain   . Migraine   . Mitral valve prolapse   . Muscle cramps   . Pulmonary HTN ([place])    mild with PASP 82mmHg by echo 03/2019 but 49mmHg on echo 03/2020  Past Surgical History:  Procedure Laterality Date  . BACK SURGERY    . CESAREAN SECTION    . LEFT HEART CATHETERIZATION WITH CORONARY ANGIOGRAM N/A 07/08/2013   Procedure: LEFT HEART CATHETERIZATION WITH CORONARY ANGIOGRAM;  Surgeon: Josue Hector, MD;  Location: [facility] CATH LAB;  Service: Cardiovascular;  Laterality: N/A;  . TUBAL LIGATION       Current Meds  Medication Sig  . acetaminophen (TYLENOL) 500 MG tablet Take 2 tablets (1,000 mg total) by mouth at bedtime.  Marland Kitchen aspirin EC 81 MG tablet Take 1 tablet (81 mg total) by mouth daily.  Marland Kitchen atorvastatin (LIPITOR) 80 MG tablet TAKE 1 TABLET BY MOUTH EVERY DAY  . carvedilol (COREG) 12.5 MG tablet Take 1/2 tablet by mouth in the morning and 1 tablet by mouth in the evening.  . Cholecalciferol (VITAMIN D3) 50 MCG (2000 UT) capsule Take 2,000 Units by mouth daily.  . diclofenac sodium (VOLTAREN) 1 % GEL Apply 2 g topically 4 (four) times daily as needed (pain).  Marland Kitchen  ELDERBERRY PO Take by mouth.  . furosemide (LASIX) 20 MG tablet Take 1 tablet (20 mg total) by mouth daily. You may take 1 extra tablet (20 mg) as needed for increased swelling  . gabapentin (NEURONTIN) 300 MG capsule Take 300-600 mg by mouth as needed.  . hydrALAZINE (APRESOLINE) 100 MG tablet Take 1 tablet (100 mg total) by mouth 3 (three) times daily.  . irbesartan-hydrochlorothiazide (AVALIDE) 300-12.5 MG tablet Take 1 tablet by mouth daily.  . magnesium 30 MG tablet Take 30 mg by mouth 2 (two) times daily.  . Melatonin 5 MG CAPS Take 1 capsule by mouth at bedtime as needed.  . Misc Natural Products (OSTEO BI-FLEX ADV TRIPLE ST PO) Take 1 tablet by mouth 2 (two) times daily. Glucosamine and turmeric  . pantoprazole (PROTONIX) 40 MG tablet TAKE 1 TABLET BY MOUTH EVERY DAY  . Potassium 99 MG TABS Take 1 tablet by mouth daily.  . pramipexole (MIRAPEX) 0.25 MG tablet Take 1 tablet (0.25 mg total) by mouth at bedtime.  . Semaglutide-Weight Management 0.5 MG/0.5ML SOAJ Inject 0.5 mg into the skin once a week for 28 days.  Derrill Memo ON 05/19/2020] Semaglutide-Weight Management 1 MG/0.5ML SOAJ Inject 1 mg into the skin once a week for 28 days.  Derrill Memo ON 06/17/2020] Semaglutide-Weight Management 1.7 MG/0.75ML SOAJ Inject 1.7 mg into the skin once a week for 28 days.  Derrill Memo ON 07/16/2020] Semaglutide-Weight Management 2.4 MG/0.75ML SOAJ Inject 2.4 mg into the skin once a week.  . triamcinolone cream (KENALOG) 0.1 % APPLY 1 APPLICATION TOPICALLY DAILY AS NEEDED.     Allergies:   Patient has no known allergies.   Social History   Tobacco Use  . Smoking status: Former Smoker    Types: Cigarettes    Quit date: 07/31/1996    Years since quitting: 23.7  . Smokeless tobacco: Never Used  Vaping Use  . Vaping Use: Never used  Substance Use Topics  . Alcohol use: Yes    Comment: very little  . Drug use: No     Family Hx: The patient's family history includes AAA (abdominal aortic aneurysm) in  her mother; Breast cancer in her cousin; Colon cancer in her cousin; Hyperlipidemia in her brother and mother; Hypertension in her brother, father, and mother; Liver cancer in her maternal aunt; Stroke in her father. There is no history of Colon polyps, Esophageal cancer, Stomach cancer, or Rectal cancer.  ROS:   Please  see the history of present illness.     All other systems reviewed and are negative.   Labs/Other Tests and Data Reviewed:    Recent Labs: 09/04/2019: ALT 21; TSH 1.78 01/27/2020: B Natriuretic Peptide 21.9; Hemoglobin 12.8; Platelets 260 03/22/2020: BUN 12; Creatinine, Ser 0.91; Potassium 4.1; Sodium 143   Recent Lipid Panel Lab Results  Component Value Date/Time   CHOL 116 09/04/2019 08:03 AM   CHOL 193 10/01/2017 11:50 AM   TRIG 74 09/04/2019 08:03 AM   HDL 54 09/04/2019 08:03 AM   HDL 51 10/01/2017 11:50 AM   CHOLHDL 2.1 09/04/2019 08:03 AM   LDLCALC 47 09/04/2019 08:03 AM    Wt Readings from Last 3 Encounters:  05/09/20 290 lb 9.6 oz (131.8 kg)  03/22/20 291 lb (132 kg)  03/08/20 289 lb (131.1 kg)     Objective:    Vital Signs:  BP 132/88   Pulse 73   Ht 5\' 10"  (1.778 m)   Wt 290 lb 9.6 oz (131.8 kg)   LMP 10/04/2014   SpO2 99%   BMI 41.70 kg/m    GEN: Well nourished, well developed in no acute distress HEENT: Normal NECK: No JVD; No carotid bruits LYMPHATICS: No lymphadenopathy CARDIAC:RRR, no murmurs, rubs, gallops RESPIRATORY:  Clear to auscultation without rales, wheezing or rhonchi  ABDOMEN: Soft, non-tender, non-distended MUSCULOSKELETAL:  No edema; No deformity  SKIN: Warm and dry NEUROLOGIC:  Alert and oriented x 3 PSYCHIATRIC:  Normal affect   ASSESSMENT & PLAN:    1. OSA  -she lost her PAP device due to noncompliance and underwent repeat sleep study showing severe OSA and nocturnal hypoxemia -she is now waiting to get her PAP in a few weeks -she will see me back virtually once she gets her device  2.  Chronic diastolic  CHF -she has continues to have chronic LE edema and hand edema that intermittently increases likely related to poor compliance with low Na diet -I encouraged her to avoid any added salt and follow a strict <2gm Na diet -continue Lasix 20mg  daily and take an extra dose when needed for increased edema  3.  HTN -BP is well controlled on exam today -she will continue on Carvedilol 6.25mg  qam and 12.5mg  qpm, Hydralazine 100mg  TID, Avalide 300-12.5mg  daily -likely being driven by poor compliance with low Na diet -renal dopplers showed 1-59% left non obstructing renal artery stenosis -I have refilled her Carvedilol, hydralazine and Avalide -SCr stable at 0.91 and K+ 4.1 on review of outside labs from PCP in march 2022  4.  Palpitations -Event monitor was normal -still intermittently has some palpitations and did not have any while on heart monitor -she got a [employer] device -I encouraged her to send me the strips to review  5.  Pulmonary HTN -mildly elevated and likely related to G2DD  -continue diuretics -repeat echo 03/2020 showed normal PAP -home sleep study showed severe OSA  6.  HLD -LDL was 47 in Sept 2021 on review of labs that PCP ordered  -continue lipitor 80mg  daily  Medication Adjustments/Labs and Tests Ordered: Current medicines are reviewed at length with the patient today.  Concerns regarding medicines are outlined above.  Tests Ordered: No orders of the defined types were placed in this encounter.  Medication Changes: No orders of the defined types were placed in this encounter.   Disposition:  Follow up 6 months  Signed, Fransico Him, MD  05/09/2020 8:22 AM    [place]

## 2020-05-24 ENCOUNTER — Other Ambulatory Visit: Payer: Self-pay

## 2020-05-25 ENCOUNTER — Encounter: Payer: Self-pay | Admitting: Internal Medicine

## 2020-05-25 ENCOUNTER — Ambulatory Visit (INDEPENDENT_AMBULATORY_CARE_PROVIDER_SITE_OTHER): Payer: 59 | Admitting: Internal Medicine

## 2020-05-25 VITALS — BP 120/80 | HR 80 | Temp 98.3°F | Ht 70.0 in | Wt 287.8 lb

## 2020-05-25 DIAGNOSIS — Z23 Encounter for immunization: Secondary | ICD-10-CM

## 2020-05-25 DIAGNOSIS — E7800 Pure hypercholesterolemia, unspecified: Secondary | ICD-10-CM | POA: Diagnosis not present

## 2020-05-25 DIAGNOSIS — R7302 Impaired glucose tolerance (oral): Secondary | ICD-10-CM | POA: Diagnosis not present

## 2020-05-25 DIAGNOSIS — Z0000 Encounter for general adult medical examination without abnormal findings: Secondary | ICD-10-CM | POA: Diagnosis not present

## 2020-05-25 DIAGNOSIS — I5032 Chronic diastolic (congestive) heart failure: Secondary | ICD-10-CM

## 2020-05-25 DIAGNOSIS — I10 Essential (primary) hypertension: Secondary | ICD-10-CM | POA: Diagnosis not present

## 2020-05-25 LAB — COMPREHENSIVE METABOLIC PANEL
ALT: 18 U/L (ref 0–35)
AST: 13 U/L (ref 0–37)
Albumin: 4.4 g/dL (ref 3.5–5.2)
Alkaline Phosphatase: 54 U/L (ref 39–117)
BUN: 14 mg/dL (ref 6–23)
CO2: 29 mEq/L (ref 19–32)
Calcium: 9.4 mg/dL (ref 8.4–10.5)
Chloride: 105 mEq/L (ref 96–112)
Creatinine, Ser: 0.86 mg/dL (ref 0.40–1.20)
GFR: 74.48 mL/min (ref >60.00)
Glucose, Bld: 74 mg/dL (ref 70–99)
Potassium: 3.6 mEq/L (ref 3.5–5.1)
Sodium: 142 mEq/L (ref 135–145)
Total Bilirubin: 0.7 mg/dL (ref 0.2–1.2)
Total Protein: 7.1 g/dL (ref 6.0–8.3)

## 2020-05-25 LAB — CBC WITH DIFFERENTIAL/PLATELET
Basophils Absolute: 0 10*3/uL (ref 0.0–0.1)
Basophils Relative: 0.5 % (ref 0.0–3.0)
Eosinophils Absolute: 0.3 10*3/uL (ref 0.0–0.7)
Eosinophils Relative: 6.9 % — ABNORMAL HIGH (ref 0.0–5.0)
HCT: 35.4 % — ABNORMAL LOW (ref 36.0–46.0)
Hemoglobin: 11.9 g/dL — ABNORMAL LOW (ref 12.0–15.0)
Lymphocytes Relative: 39.6 % (ref 12.0–46.0)
Lymphs Abs: 1.8 10*3/uL (ref 0.7–4.0)
MCHC: 33.7 g/dL (ref 30.0–36.0)
MCV: 89.1 fl (ref 78.0–100.0)
Monocytes Absolute: 0.3 10*3/uL (ref 0.1–1.0)
Monocytes Relative: 5.9 % (ref 3.0–12.0)
Neutro Abs: 2.2 10*3/uL (ref 1.4–7.7)
Neutrophils Relative %: 47.1 % (ref 43.0–77.0)
Platelets: 220 10*3/uL (ref 150.0–400.0)
RBC: 3.97 Mil/uL (ref 3.87–5.11)
RDW: 16.4 % — ABNORMAL HIGH (ref 11.5–15.5)
WBC: 4.7 10*3/uL (ref 4.0–10.5)

## 2020-05-25 LAB — LIPID PANEL
Cholesterol: 122 mg/dL (ref 0–200)
HDL: 47.4 mg/dL (ref >39.00)
LDL Cholesterol: 60 mg/dL (ref 0–99)
NonHDL: 74.71
Total CHOL/HDL Ratio: 3
Triglycerides: 76 mg/dL (ref 0.0–149.0)
VLDL: 15.2 mg/dL (ref 0.0–40.0)

## 2020-05-25 LAB — HEMOGLOBIN A1C: Hgb A1c MFr Bld: 6 % (ref 4.6–6.5)

## 2020-05-25 LAB — VITAMIN D 25 HYDROXY (VIT D DEFICIENCY, FRACTURES): VITD: 31.66 ng/mL (ref 30.00–100.00)

## 2020-05-25 LAB — TSH: TSH: 1.47 u[IU]/mL (ref 0.35–4.50)

## 2020-05-25 LAB — VITAMIN B12: Vitamin B-12: 356 pg/mL (ref 211–911)

## 2020-05-25 LAB — MAGNESIUM: Magnesium: 1.9 mg/dL (ref 1.5–2.5)

## 2020-05-25 NOTE — Patient Instructions (Signed)
-Nice seeing you today!!  -Lab work today; will notify you once results are available.  -Second shingles vaccine today.  -Remember your 3rd and 4th COVID vaccines at the pharmacy.  -Schedule follow up in 6 months or sooner as needed.   Preventive Care 53-58 Years Old, Female Preventive care refers to lifestyle choices and visits with your health care provider that can promote health and wellness. This includes:  A yearly physical exam. This is also called an annual wellness visit.  Regular dental and eye exams.  Immunizations.  Screening for certain conditions.  Healthy lifestyle choices, such as: ? Eating a healthy diet. ? Getting regular exercise. ? Not using drugs or products that contain nicotine and tobacco. ? Limiting alcohol use. What can I expect for my preventive care visit? Physical exam Your health care provider will check your:  Height and weight. These may be used to calculate your BMI (body mass index). BMI is a measurement that tells if you are at a healthy weight.  Heart rate and blood pressure.  Body temperature.  Skin for abnormal spots. Counseling Your health care provider may ask you questions about your:  Past medical problems.  Family's medical history.  Alcohol, tobacco, and drug use.  Emotional well-being.  Home life and relationship well-being.  Sexual activity.  Diet, exercise, and sleep habits.  Work and work Statistician.  Access to firearms.  Method of birth control.  Menstrual cycle.  Pregnancy history. What immunizations do I need? Vaccines are usually given at various ages, according to a schedule. Your health care provider will recommend vaccines for you based on your age, medical history, and lifestyle or other factors, such as travel or where you work.   What tests do I need? Blood tests  Lipid and cholesterol levels. These may be checked every 5 years, or more often if you are over 59 years old.  Hepatitis C  test.  Hepatitis B test. Screening  Lung cancer screening. You may have this screening every year starting at age 35 if you have a 30-pack-year history of smoking and currently smoke or have quit within the past 15 years.  Colorectal cancer screening. ? All adults should have this screening starting at age 4 and continuing until age 52. ? Your health care provider may recommend screening at age 39 if you are at increased risk. ? You will have tests every 1-10 years, depending on your results and the type of screening test.  Diabetes screening. ? This is done by checking your blood sugar (glucose) after you have not eaten for a while (fasting). ? You may have this done every 1-3 years.  Mammogram. ? This may be done every 1-2 years. ? Talk with your health care provider about when you should start having regular mammograms. This may depend on whether you have a family history of breast cancer.  BRCA-related cancer screening. This may be done if you have a family history of breast, ovarian, tubal, or peritoneal cancers.  Pelvic exam and Pap test. ? This may be done every 3 years starting at age 40. ? Starting at age 78, this may be done every 5 years if you have a Pap test in combination with an HPV test. Other tests  STD (sexually transmitted disease) testing, if you are at risk.  Bone density scan. This is done to screen for osteoporosis. You may have this scan if you are at high risk for osteoporosis. Talk with your health care provider about your  test results, treatment options, and if necessary, the need for more tests. Follow these instructions at home: Eating and drinking  Eat a diet that includes fresh fruits and vegetables, whole grains, lean protein, and low-fat dairy products.  Take vitamin and mineral supplements as recommended by your health care provider.  Do not drink alcohol if: ? Your health care provider tells you not to drink. ? You are pregnant, may be  pregnant, or are planning to become pregnant.  If you drink alcohol: ? Limit how much you have to 0-1 drink a day. ? Be aware of how much alcohol is in your drink. In the [place] one drink equals one 12 oz bottle of beer (355 mL), one 5 oz glass of wine (148 mL), or one 1 oz glass of hard liquor (44 mL).   Lifestyle  Take daily care of your teeth and gums. Brush your teeth every morning and night with fluoride toothpaste. Floss one time each day.  Stay active. Exercise for at least 30 minutes 5 or more days each week.  Do not use any products that contain nicotine or tobacco, such as cigarettes, e-cigarettes, and chewing tobacco. If you need help quitting, ask your health care provider.  Do not use drugs.  If you are sexually active, practice safe sex. Use a condom or other form of protection to prevent STIs (sexually transmitted infections).  If you do not wish to become pregnant, use a form of birth control. If you plan to become pregnant, see your health care provider for a prepregnancy visit.  If told by your health care provider, take low-dose aspirin daily starting at age 94.  Find healthy ways to cope with stress, such as: ? Meditation, yoga, or listening to music. ? Journaling. ? Talking to a trusted person. ? Spending time with friends and family. Safety  Always wear your seat belt while driving or riding in a vehicle.  Do not drive: ? If you have been drinking alcohol. Do not ride with someone who has been drinking. ? When you are tired or distracted. ? While texting.  Wear a helmet and other protective equipment during sports activities.  If you have firearms in your house, make sure you follow all gun safety procedures. What's next?  Visit your health care provider once a year for an annual wellness visit.  Ask your health care provider how often you should have your eyes and teeth checked.  Stay up to date on all vaccines. This information is not intended to  replace advice given to you by your health care provider. Make sure you discuss any questions you have with your health care provider. Document Revised: 09/22/2019 Document Reviewed: 08/29/2017 Elsevier Patient Education  2021 [employer].

## 2020-05-25 NOTE — Progress Notes (Signed)
Established Patient Office Visit     This visit occurred during the SARS-CoV-2 public health emergency.  Safety protocols were in place, including screening questions prior to the visit, additional usage of staff PPE, and extensive cleaning of exam room while observing appropriate contact time as indicated for disinfecting solutions.    CC/Reason for Visit: Annual preventive exam  HPI: Jamie [name] is a 60 y.o. female who is coming in today for the above mentioned reasons. Past Medical History is significant for: Hypertension, heart failure with preserved ejection fraction, morbid obesity, hyperlipidemia, impaired glucose tolerance, obstructive sleep apnea recently started on CPAP.  She had a colonoscopy in 2019, she had a mammogram in 2021.  She had a Pap smear in 2022 and follows with her GYN annually.  She has had 2 COVID vaccines.  She is due for her second shingles vaccine.  She has routine eye and dental care.  She is requesting a handicap placard due to significant leg pain.   Past Medical/Surgical History: Past Medical History:  Diagnosis Date  . Acquired dilation of ascending aorta and aortic root ([place])    32m by 2D echo 03/2019  . Allergy   . Aortic valve disease    functionally bicuspid AV with fusion of the right and left coronary cusps with no AS by echo 03/2020  . Arthritis    in back  . Chest pain    neg cath 2016 after false positive myoview  . Depression   . GERD (gastroesophageal reflux disease)   . Hyperglycemia   . Hyperlipidemia   . Hypertension   . Joint pain   . Knee pain   . Low back pain   . Migraine   . Mitral valve prolapse   . Muscle cramps   . Pulmonary HTN ([place]    mild with PASP 375mg by echo 03/2019 but 2681m on echo 03/2020    Past Surgical History:  Procedure Laterality Date  . BACK SURGERY    . CESAREAN SECTION    . LEFT HEART CATHETERIZATION WITH CORONARY ANGIOGRAM N/A 07/08/2013   Procedure: LEFT HEART CATHETERIZATION WITH  CORONARY ANGIOGRAM;  Surgeon: PetJosue HectorD;  [facility];  Service: Cardiovascular;  Laterality: N/A;  . TUBAL LIGATION      Social History:  reports that she quit smoking about 23 years ago. Her smoking use included cigarettes. She has never used smokeless tobacco. She reports current alcohol use. She reports that she does not use drugs.  Allergies: No Known Allergies  Family History:  Family History  Problem Relation Age of Onset  . Hyperlipidemia Mother   . Hypertension Mother   . AAA (abdominal aortic aneurysm) Mother   . Stroke Father   . Hypertension Father   . Hyperlipidemia Brother        x2  . Hypertension Brother        x2  . Liver cancer Maternal Aunt   . Breast cancer Cousin   . Colon cancer Cousin   . Colon polyps Neg Hx   . Esophageal cancer Neg Hx   . Stomach cancer Neg Hx   . Rectal cancer Neg Hx      Current Outpatient Medications:  .  acetaminophen (TYLENOL) 500 MG tablet, Take 2 tablets (1,000 mg total) by mouth at bedtime., Disp: 30 tablet, Rfl: 0 .  APPLE CIDER VINEGAR PO, Take by mouth. [place] gummies, Disp: , Rfl:  .  aspirin EC 81 MG tablet,  Take 1 tablet (81 mg total) by mouth daily., Disp:  , Rfl:  .  atorvastatin (LIPITOR) 80 MG tablet, Take 1 tablet (80 mg total) by mouth daily., Disp: 90 tablet, Rfl: 3 .  carvedilol (COREG) 12.5 MG tablet, Take 1/2 tablet by mouth in the morning and 1 tablet by mouth in the evening., Disp: 225 tablet, Rfl: 3 .  Cholecalciferol (VITAMIN D3) 50 MCG (2000 UT) capsule, Take 2,000 Units by mouth daily., Disp: , Rfl:  .  diclofenac sodium (VOLTAREN) 1 % GEL, Apply 2 g topically 4 (four) times daily as needed (pain)., Disp: , Rfl:  .  ELDERBERRY PO, Take by mouth., Disp: , Rfl:  .  furosemide (LASIX) 20 MG tablet, Take 1 tablet (20 mg total) by mouth daily. You may take 1 extra tablet (20 mg) as needed for increased swelling, Disp: 90 tablet, Rfl: 3 .  gabapentin (NEURONTIN) 300 MG capsule, Take 300-600 mg  by mouth as needed., Disp: , Rfl:  .  Homeopathic Products (LEG CRAMPS PO), Take by mouth., Disp: , Rfl:  .  Homeopathic Products ([place]), Apply topically. Spray or moose, Disp: , Rfl:  .  hydrALAZINE (APRESOLINE) 100 MG tablet, Take 1 tablet (100 mg total) by mouth 3 (three) times daily., Disp: 270 tablet, Rfl: 3 .  irbesartan-hydrochlorothiazide (AVALIDE) 300-12.5 MG tablet, Take 1 tablet by mouth daily., Disp: 90 tablet, Rfl: 3 .  losartan (COZAAR) 100 MG tablet, Take by mouth daily., Disp: , Rfl:  .  magnesium 30 MG tablet, Take 30 mg by mouth 2 (two) times daily., Disp: , Rfl:  .  Melatonin 5 MG CAPS, Take 1 capsule by mouth at bedtime as needed., Disp: , Rfl:  .  Misc Natural Products (OSTEO BI-FLEX ADV TRIPLE ST PO), Take 1 tablet by mouth 2 (two) times daily. Glucosamine and turmeric, Disp: , Rfl:  .  pramipexole (MIRAPEX) 0.25 MG tablet, Take 1 tablet (0.25 mg total) by mouth at bedtime., Disp: 30 tablet, Rfl: 5 .  Semaglutide-Weight Management 1 MG/0.5ML SOAJ, Inject 1 mg into the skin once a week for 28 days., Disp: 2 mL, Rfl: 0 .  [START ON 06/17/2020] Semaglutide-Weight Management 1.7 MG/0.75ML SOAJ, Inject 1.7 mg into the skin once a week for 28 days., Disp: 3 mL, Rfl: 0 .  [START ON 07/16/2020] Semaglutide-Weight Management 2.4 MG/0.75ML SOAJ, Inject 2.4 mg into the skin once a week., Disp: 3 mL, Rfl: 5 .  triamcinolone cream (KENALOG) 0.1 %, APPLY 1 APPLICATION TOPICALLY DAILY AS NEEDED., Disp: 45 g, Rfl: 0 .  pantoprazole (PROTONIX) 40 MG tablet, TAKE 1 TABLET BY MOUTH EVERY DAY (Patient not taking: Reported on 05/25/2020), Disp: 90 tablet, Rfl: 3  Review of Systems:  Constitutional: Denies fever, chills, diaphoresis, appetite change and fatigue.  HEENT: Denies photophobia, eye pain, redness, hearing loss, ear pain, congestion, sore throat, rhinorrhea, sneezing, mouth sores, trouble swallowing, neck pain, neck stiffness and tinnitus.   Respiratory: Denies SOB, DOE,  cough, chest tightness,  and wheezing.   Cardiovascular: Denies chest pain, palpitations and leg swelling.  Gastrointestinal: Denies nausea, vomiting, abdominal pain, diarrhea, constipation, blood in stool and abdominal distention.  Genitourinary: Denies dysuria, urgency, frequency, hematuria, flank pain and difficulty urinating.  Endocrine: Denies: hot or cold intolerance, sweats, changes in hair or nails, polyuria, polydipsia. Musculoskeletal: Denies  joint swelling, arthralgias and gait problem.  Skin: Denies pallor, rash and wound.  Neurological: Denies dizziness, seizures, syncope, weakness, light-headedness, numbness and headaches.  Hematological: Denies adenopathy. Easy  bruising, personal or family bleeding history  Psychiatric/Behavioral: Denies suicidal ideation, mood changes, confusion, nervousness, sleep disturbance and agitation    Physical Exam: Vitals:   05/25/20 1309  BP: 120/80  Pulse: 80  Temp: 98.3 F (36.8 C)  TempSrc: Oral  SpO2: 97%  Weight: 287 lb 12.8 oz (130.5 kg)  Height: '5\' 10"'  (1.778 m)    Body mass index is 41.3 kg/m.   Constitutional: NAD, calm, comfortable, obese Eyes: PERRL, lids and conjunctivae normal ENMT: Mucous membranes are moist. Posterior pharynx clear of any exudate or lesions. Normal dentition. Tympanic membrane is pearly white, no erythema or bulging. Neck: normal, supple, no masses, no thyromegaly Respiratory: clear to auscultation bilaterally, no wheezing, no crackles. Normal respiratory effort. No accessory muscle use.  Cardiovascular: Regular rate and rhythm, no murmurs / rubs / gallops. No extremity edema. 2+ pedal pulses. No carotid bruits.  Abdomen: no tenderness, no masses palpated. No hepatosplenomegaly. Bowel sounds positive.  Musculoskeletal: no clubbing / cyanosis. No joint deformity upper and lower extremities. Good ROM, no contractures. Normal muscle tone.  Skin: no rashes, lesions, ulcers. No induration Neurologic: CN  2-12 grossly intact. Sensation intact, DTR normal. Strength 5/5 in all 4.  Psychiatric: Normal judgment and insight. Alert and oriented x 3. Normal mood.    Impression and Plan:  Encounter for preventive health examination -She has routine eye and dental care. -She will get her second and final shingles vaccine today.  She knows that she needs to get her third and fourth COVID vaccines at the pharmacy.  Flu and Tdap are up-to-date. -Screening labs today. -Healthy lifestyle discussed in detail. -Mammogram will be due in December of this year. -She had a colonoscopy in 2019 and is a 5-year callback.   -She had a Pap smear earlier this year by her GYN.  Morbid obesity ([place]) -Discussed healthy lifestyle, including increased physical activity and better food choices to promote weight loss.  Pure hypercholesterolemia  - Plan: Lipid panel -Last lipid panel in September 2021 with total cholesterol of 116, triglycerides 74 and LDL cholesterol of 47.  She is on atorvastatin 80 mg daily.  Primary hypertension  - Plan: CBC with Differential/Platelet, Comprehensive metabolic panel, Magnesium -Blood pressures well controlled today.  IGT (impaired glucose tolerance)  - Plan: Hemoglobin A1c -Last A1c was 5.8 in September 4562  Chronic diastolic CHF (congestive heart failure) (HCC) -Stable, compensated, followed by cardiology. -On statin, Coreg, Lasix, hydralazine, irbesartan.  Need for shingles vaccine -Second shingles vaccine administered today.   Patient Instructions   -Nice seeing you today!!  -Lab work today; will notify you once results are available.  -Second shingles vaccine today.  -Remember your 3rd and 4th COVID vaccines at the pharmacy.  -Schedule follow up in 6 months or sooner as needed.   Preventive Care 76-36 Years Old, Female Preventive care refers to lifestyle choices and visits with your health care provider that can promote health and wellness. This  includes:  A yearly physical exam. This is also called an annual wellness visit.  Regular dental and eye exams.  Immunizations.  Screening for certain conditions.  Healthy lifestyle choices, such as: ? Eating a healthy diet. ? Getting regular exercise. ? Not using drugs or products that contain nicotine and tobacco. ? Limiting alcohol use. What can I expect for my preventive care visit? Physical exam Your health care provider will check your:  Height and weight. These may be used to calculate your BMI (body mass index). BMI is a measurement  that tells if you are at a healthy weight.  Heart rate and blood pressure.  Body temperature.  Skin for abnormal spots. Counseling Your health care provider may ask you questions about your:  Past medical problems.  Family's medical history.  Alcohol, tobacco, and drug use.  Emotional well-being.  Home life and relationship well-being.  Sexual activity.  Diet, exercise, and sleep habits.  Work and work Statistician.  Access to firearms.  Method of birth control.  Menstrual cycle.  Pregnancy history. What immunizations do I need? Vaccines are usually given at various ages, according to a schedule. Your health care provider will recommend vaccines for you based on your age, medical history, and lifestyle or other factors, such as travel or where you work.   What tests do I need? Blood tests  Lipid and cholesterol levels. These may be checked every 5 years, or more often if you are over 60 years old.  Hepatitis C test.  Hepatitis B test. Screening  Lung cancer screening. You may have this screening every year starting at age 29 if you have a 30-pack-year history of smoking and currently smoke or have quit within the past 15 years.  Colorectal cancer screening. ? All adults should have this screening starting at age 49 and continuing until age 42. ? Your health care provider may recommend screening at age 109 if you  are at increased risk. ? You will have tests every 1-10 years, depending on your results and the type of screening test.  Diabetes screening. ? This is done by checking your blood sugar (glucose) after you have not eaten for a while (fasting). ? You may have this done every 1-3 years.  Mammogram. ? This may be done every 1-2 years. ? Talk with your health care provider about when you should start having regular mammograms. This may depend on whether you have a family history of breast cancer.  BRCA-related cancer screening. This may be done if you have a family history of breast, ovarian, tubal, or peritoneal cancers.  Pelvic exam and Pap test. ? This may be done every 3 years starting at age 62. ? Starting at age 34, this may be done every 5 years if you have a Pap test in combination with an HPV test. Other tests  STD (sexually transmitted disease) testing, if you are at risk.  Bone density scan. This is done to screen for osteoporosis. You may have this scan if you are at high risk for osteoporosis. Talk with your health care provider about your test results, treatment options, and if necessary, the need for more tests. Follow these instructions at home: Eating and drinking  Eat a diet that includes fresh fruits and vegetables, whole grains, lean protein, and low-fat dairy products.  Take vitamin and mineral supplements as recommended by your health care provider.  Do not drink alcohol if: ? Your health care provider tells you not to drink. ? You are pregnant, may be pregnant, or are planning to become pregnant.  If you drink alcohol: ? Limit how much you have to 0-1 drink a day. ? Be aware of how much alcohol is in your drink. In the [place] one drink equals one 12 oz bottle of beer (355 mL), one 5 oz glass of wine (148 mL), or one 1 oz glass of hard liquor (44 mL).   Lifestyle  Take daily care of your teeth and gums. Brush your teeth every morning and night with fluoride  toothpaste. Floss one  time each day.  Stay active. Exercise for at least 30 minutes 5 or more days each week.  Do not use any products that contain nicotine or tobacco, such as cigarettes, e-cigarettes, and chewing tobacco. If you need help quitting, ask your health care provider.  Do not use drugs.  If you are sexually active, practice safe sex. Use a condom or other form of protection to prevent STIs (sexually transmitted infections).  If you do not wish to become pregnant, use a form of birth control. If you plan to become pregnant, see your health care provider for a prepregnancy visit.  If told by your health care provider, take low-dose aspirin daily starting at age 69.  Find healthy ways to cope with stress, such as: ? Meditation, yoga, or listening to music. ? Journaling. ? Talking to a trusted person. ? Spending time with friends and family. Safety  Always wear your seat belt while driving or riding in a vehicle.  Do not drive: ? If you have been drinking alcohol. Do not ride with someone who has been drinking. ? When you are tired or distracted. ? While texting.  Wear a helmet and other protective equipment during sports activities.  If you have firearms in your house, make sure you follow all gun safety procedures. What's next?  Visit your health care provider once a year for an annual wellness visit.  Ask your health care provider how often you should have your eyes and teeth checked.  Stay up to date on all vaccines. This information is not intended to replace advice given to you by your health care provider. Make sure you discuss any questions you have with your health care provider. Document Revised: 09/22/2019 Document Reviewed: 08/29/2017 Elsevier Patient Education  2021 [place], MD [facility]

## 2020-06-06 ENCOUNTER — Telehealth: Payer: Self-pay | Admitting: Pharmacist

## 2020-06-06 MED ORDER — WEGOVY 0.5 MG/0.5ML ~~LOC~~ SOAJ: 0.5000 mg | SUBCUTANEOUS | 0 refills | Status: DC

## 2020-06-06 NOTE — Telephone Encounter (Signed)
Called pt to follow up with [facility]. She reports tolerating therapy well. 4th dose of 0.25mg  is due this Wednesday. She is aware to increase her dose of 0.5mg  for month 2. Will call pt in another month for continued titration and will plan to schedule pt in office for 3 month follow up at that time.

## 2020-06-15 ENCOUNTER — Telehealth: Payer: Self-pay | Admitting: Pharmacist

## 2020-06-15 NOTE — Telephone Encounter (Signed)
Patient called.  Is due to start [facility] 0.5mg  tonight however her [facility] does not have in stock and they could not locate it in the area.  Advised that I was are there was a backorder so we were doing was having patients use Ozempic for one month at 0.5mg  until she can titrate up to 1.0mg .  However, once I explained that Ozempic requires attaching a pen needle patient was no longer interested due to apprehension about needles.  Explained that I did not know when Wegovy 1.0mg  would be in stock so I recommended she use Ozempic for one month.  Patient will consider.

## 2020-06-21 ENCOUNTER — Other Ambulatory Visit: Payer: Self-pay | Admitting: Family Medicine

## 2020-06-21 NOTE — Addendum Note (Signed)
Addended by: Kimley Apsey E on: 06/21/2020 09:18 AM   Modules accepted: Orders

## 2020-06-21 NOTE — Telephone Encounter (Signed)
Called pt to follow up. She reports she has been doing better with her diet and still does not wish to use Ozempic for 1-2 months for dose titration while Wegovy is on back order. Will call pt once manufacturing issues are sorted out and the pharmacy can order all strengths of [facility] for her dose titration.

## 2020-07-02 IMAGING — MG DIGITAL SCREENING BILAT W/ CAD
8 of 11 series · 8 of 11 positions shown · non-contrast
Comparison: Previous exam(s).

CLINICAL DATA: Screening.

EXAM:
DIGITAL SCREENING BILATERAL MAMMOGRAM WITH CAD

[R CV]
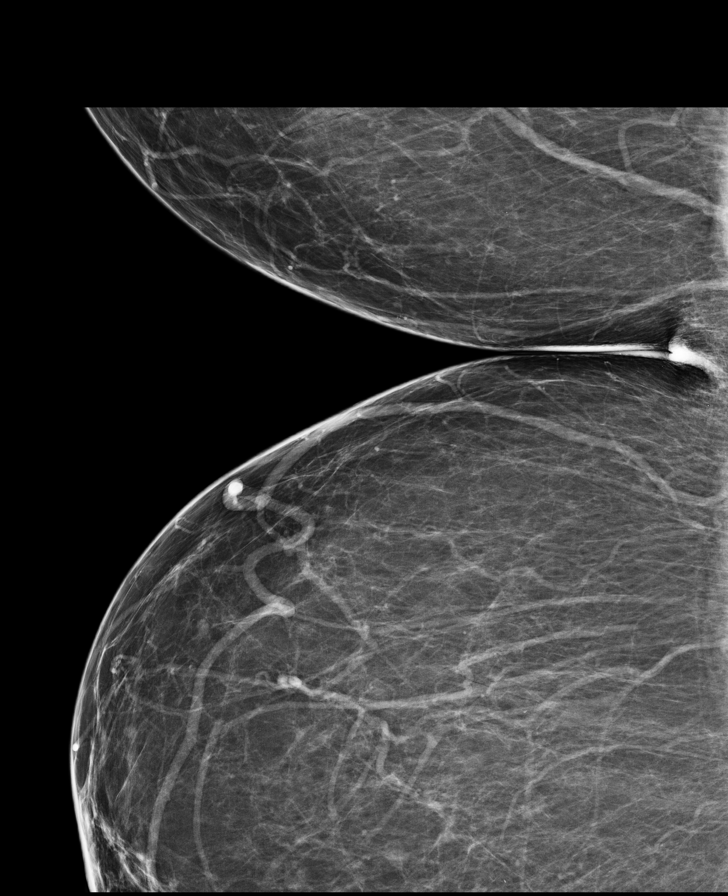

[R MLO (1 of 2)]
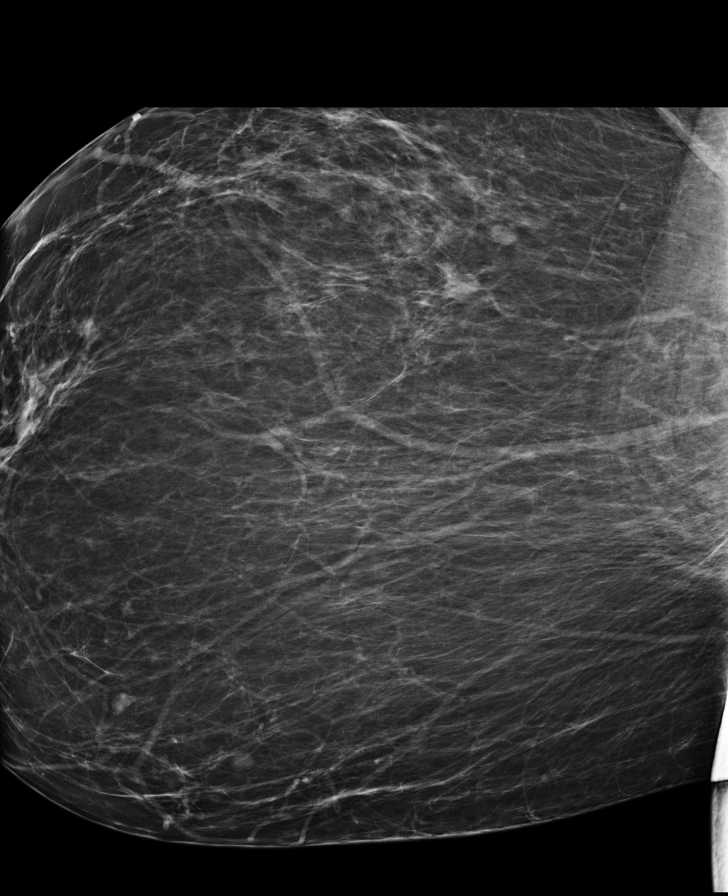

[L MLO (1 of 3)]
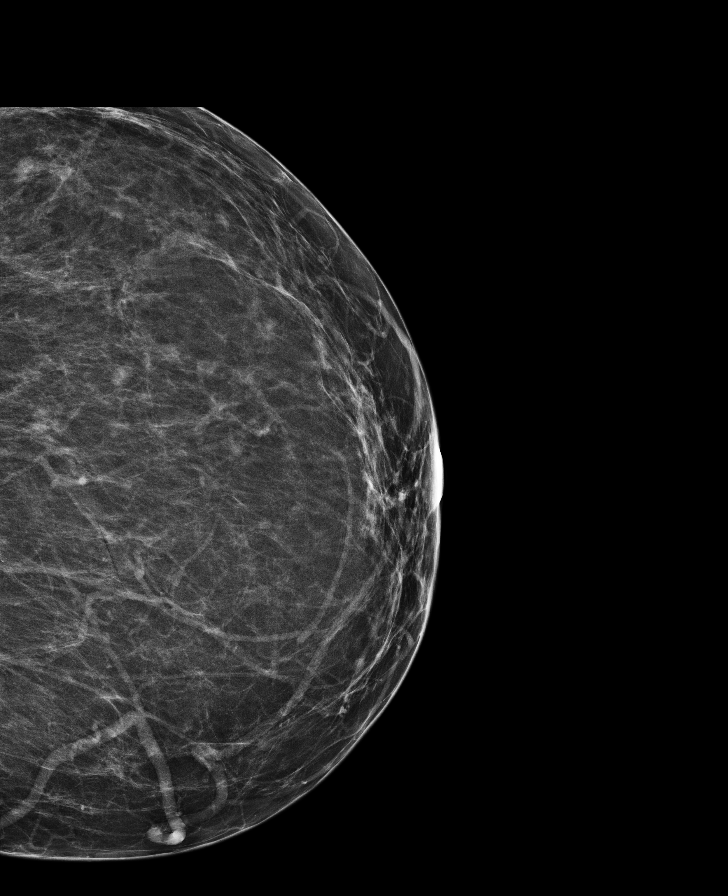

[L CC]
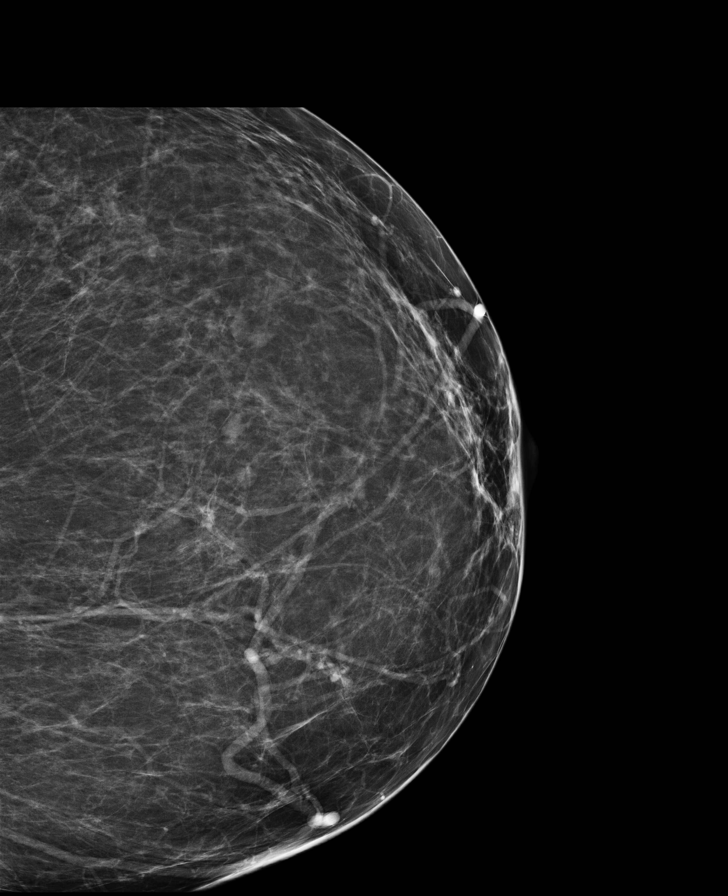

[L MLO (2 of 3)]
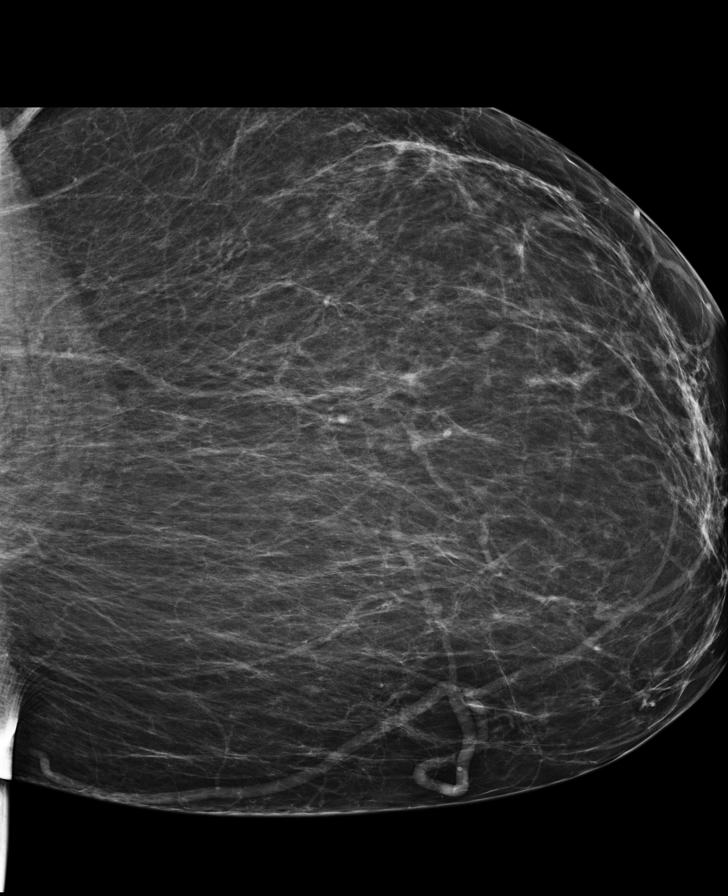

[R CC]
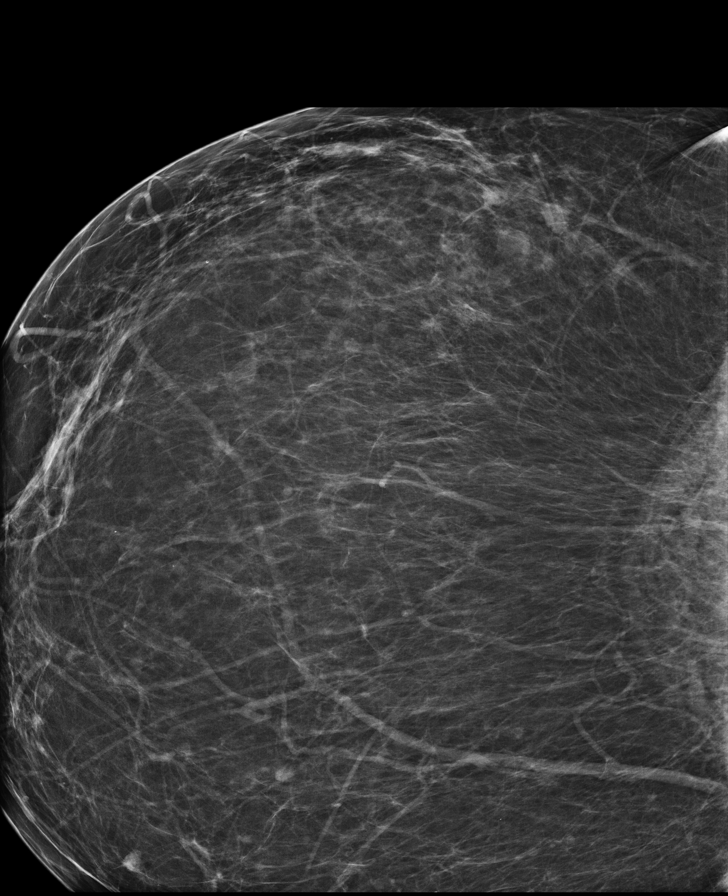

[L MLO (3 of 3)]
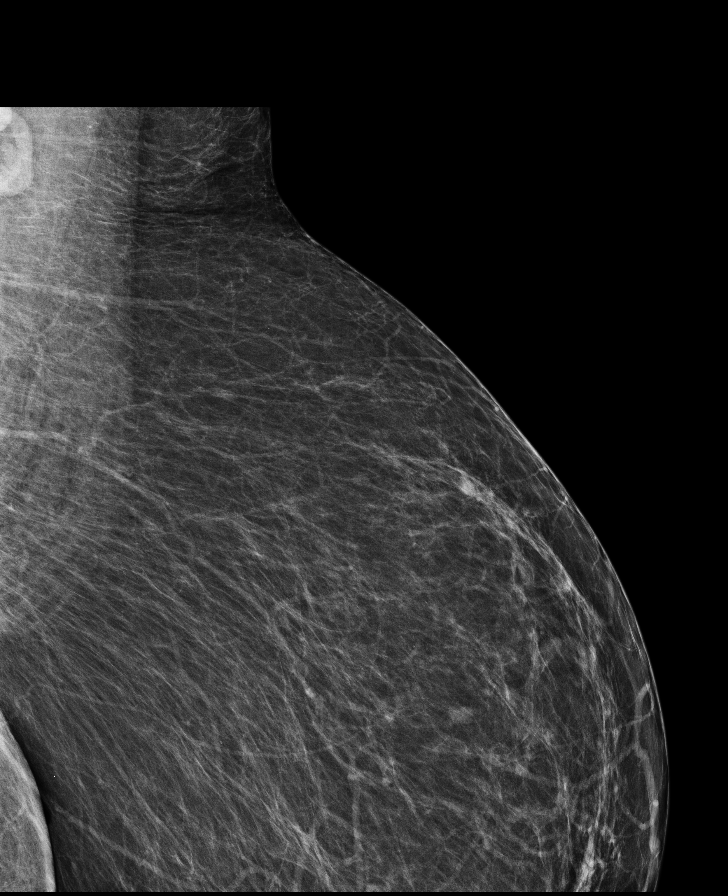

[R MLO (2 of 2)]
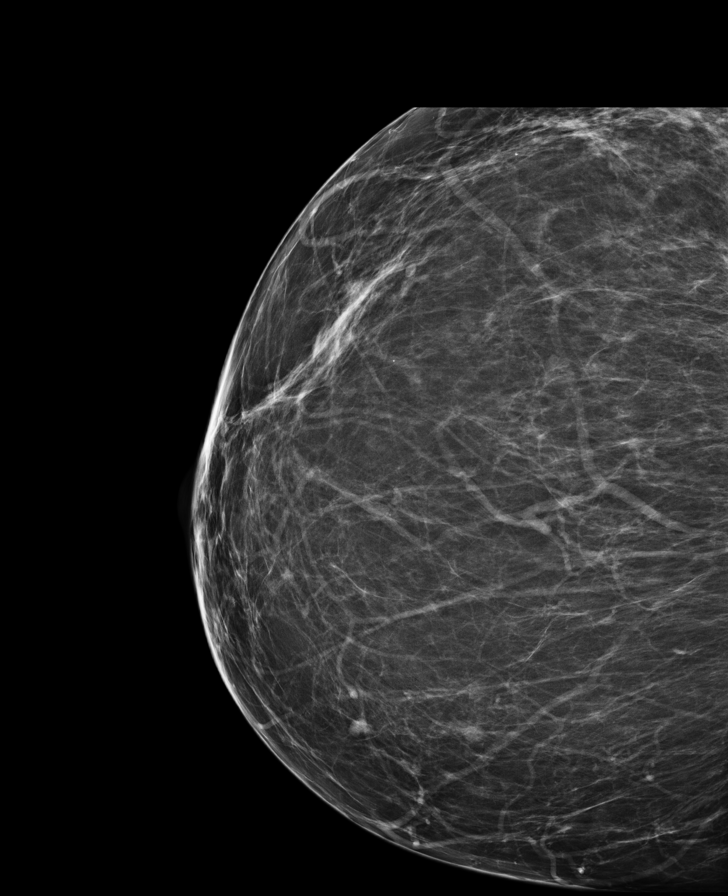

[8 of 11 positions shown; findings below may reference images not displayed]

ACR Breast Density Category b: There are scattered areas of
fibroglandular density.
FINDINGS: In the right breast, a possible mass warrants further evaluation. In
the left breast, no findings suspicious for malignancy. Images were
processed with CAD.
IMPRESSION: Further evaluation is suggested for possible mass in the right
breast.

RECOMMENDATION:
Diagnostic mammogram and possibly ultrasound of the right breast.
(Code:[id number])

The patient will be contacted regarding the findings, and additional
imaging will be scheduled.

BI-RADS CATEGORY  0: Incomplete. Need additional imaging evaluation
and/or prior mammograms for comparison.

## 2020-07-17 ENCOUNTER — Other Ambulatory Visit: Payer: Self-pay | Admitting: Internal Medicine

## 2020-07-17 DIAGNOSIS — I10 Essential (primary) hypertension: Secondary | ICD-10-CM

## 2020-07-17 DIAGNOSIS — I5032 Chronic diastolic (congestive) heart failure: Secondary | ICD-10-CM

## 2020-09-22 ENCOUNTER — Other Ambulatory Visit: Payer: Self-pay

## 2020-09-23 ENCOUNTER — Encounter: Payer: Self-pay | Admitting: Internal Medicine

## 2020-09-23 ENCOUNTER — Ambulatory Visit (INDEPENDENT_AMBULATORY_CARE_PROVIDER_SITE_OTHER): Payer: 59 | Admitting: Internal Medicine

## 2020-09-23 VITALS — BP 130/80 | HR 59 | Temp 98.1°F | Wt 285.1 lb

## 2020-09-23 DIAGNOSIS — Z23 Encounter for immunization: Secondary | ICD-10-CM | POA: Diagnosis not present

## 2020-09-23 DIAGNOSIS — T3995XA Adverse effect of unspecified nonopioid analgesic, antipyretic and antirheumatic, initial encounter: Secondary | ICD-10-CM | POA: Diagnosis not present

## 2020-09-23 DIAGNOSIS — G4440 Drug-induced headache, not elsewhere classified, not intractable: Secondary | ICD-10-CM

## 2020-09-23 MED ORDER — MELOXICAM 7.5 MG PO TABS: 7.5000 mg | ORAL_TABLET | Freq: Every day | ORAL | 0 refills | Status: DC

## 2020-09-23 NOTE — Patient Instructions (Signed)
-  Nice seeing you today!!  -STOP taking all tylenol.  -May use meloxicam if extreme headache, but try to go without.

## 2020-09-23 NOTE — Progress Notes (Signed)
Acute office Visit     This visit occurred during the SARS-CoV-2 public health emergency.  Safety protocols were in place, including screening questions prior to the visit, additional usage of staff PPE, and extensive cleaning of exam room while observing appropriate contact time as indicated for disinfecting solutions.    CC/Reason for Visit: Headache  HPI: Jamie [name] is a 60 y.o. female who is coming in today for the above mentioned reasons.  She has been having chronic, persistent headaches for about 2 weeks now headache is bitemporal but sometimes at the back of her head.  Headaches have been on an almost daily basis.  She does not have nausea, vomiting or photophobia with them.  No vision changes, aura.  No focal neurologic deficits.  She has not been checking her blood pressure routinely but it is normal in office today.  She has had her vision checked within the last 3 months.  She has some chronic arthritic pain and has been taking extra strength Tylenol 2 to 3 tablets 3 times a day.  Past Medical/Surgical History: Past Medical History:  Diagnosis Date   Acquired dilation of ascending aorta and aortic root ([place])    77mm by 2D echo 03/2019   Allergy    Aortic valve disease    functionally bicuspid AV with fusion of the right and left coronary cusps with no AS by echo 03/2020   Arthritis    in back   Chest pain    neg cath 2016 after false positive myoview   Depression    GERD (gastroesophageal reflux disease)    Hyperglycemia    Hyperlipidemia    Hypertension    Joint pain    Knee pain    Low back pain    Migraine    Mitral valve prolapse    Muscle cramps    Pulmonary HTN ([place])    mild with PASP 9mmHg by echo 03/2019 but 64mmHg on echo 03/2020    Past Surgical History:  Procedure Laterality Date   BACK SURGERY     CESAREAN SECTION     LEFT HEART CATHETERIZATION WITH CORONARY ANGIOGRAM N/A 07/08/2013   Procedure: LEFT HEART CATHETERIZATION WITH CORONARY  ANGIOGRAM;  Surgeon: Josue Hector, MD;  Location: [facility] CATH LAB;  Service: Cardiovascular;  Laterality: N/A;   TUBAL LIGATION      Social History:  reports that she quit smoking about 24 years ago. Her smoking use included cigarettes. She has never used smokeless tobacco. She reports current alcohol use. She reports that she does not use drugs.  Allergies: No Known Allergies  Family History:  Family History  Problem Relation Age of Onset   Hyperlipidemia Mother    Hypertension Mother    AAA (abdominal aortic aneurysm) Mother    Stroke Father    Hypertension Father    Hyperlipidemia Brother        x2   Hypertension Brother        x2   Liver cancer Maternal Aunt    Breast cancer Cousin    Colon cancer Cousin    Colon polyps Neg Hx    Esophageal cancer Neg Hx    Stomach cancer Neg Hx    Rectal cancer Neg Hx      Current Outpatient Medications:    acetaminophen (TYLENOL) 500 MG tablet, Take 2 tablets (1,000 mg total) by mouth at bedtime., Disp: 30 tablet, Rfl: 0   APPLE CIDER VINEGAR PO, Take by mouth. Goli  gummies, Disp: , Rfl:    aspirin EC 81 MG tablet, Take 1 tablet (81 mg total) by mouth daily., Disp:  , Rfl:    atorvastatin (LIPITOR) 80 MG tablet, Take 1 tablet (80 mg total) by mouth daily., Disp: 90 tablet, Rfl: 3   carvedilol (COREG) 12.5 MG tablet, Take 1/2 tablet by mouth in the morning and 1 tablet by mouth in the evening., Disp: 225 tablet, Rfl: 3   Cholecalciferol (VITAMIN D3) 50 MCG (2000 UT) capsule, Take 2,000 Units by mouth daily., Disp: , Rfl:    diclofenac sodium (VOLTAREN) 1 % GEL, Apply 2 g topically 4 (four) times daily as needed (pain)., Disp: , Rfl:    ELDERBERRY PO, Take by mouth., Disp: , Rfl:    furosemide (LASIX) 20 MG tablet, Take 1 tablet (20 mg total) by mouth daily. You may take 1 extra tablet (20 mg) as needed for increased swelling, Disp: 90 tablet, Rfl: 3   gabapentin (NEURONTIN) 300 MG capsule, Take 300-600 mg by mouth as needed., Disp: , Rfl:     Homeopathic Products (LEG CRAMPS PO), Take by mouth., Disp: , Rfl:    Homeopathic Products ([place]), Apply topically. Spray or moose, Disp: , Rfl:    hydrALAZINE (APRESOLINE) 100 MG tablet, Take 1 tablet (100 mg total) by mouth 3 (three) times daily., Disp: 270 tablet, Rfl: 3   irbesartan-hydrochlorothiazide (AVALIDE) 300-12.5 MG tablet, Take 1 tablet by mouth daily., Disp: 90 tablet, Rfl: 3   losartan (COZAAR) 100 MG tablet, TAKE 1 TABLET BY MOUTH EVERY DAY, Disp: 90 tablet, Rfl: 1   magnesium 30 MG tablet, Take 30 mg by mouth 2 (two) times daily., Disp: , Rfl:    meloxicam (MOBIC) 7.5 MG tablet, Take 1 tablet (7.5 mg total) by mouth daily., Disp: 30 tablet, Rfl: 0   Misc Natural Products (OSTEO BI-FLEX ADV TRIPLE ST PO), Take 1 tablet by mouth 2 (two) times daily. Glucosamine and turmeric, Disp: , Rfl:    pantoprazole (PROTONIX) 40 MG tablet, TAKE 1 TABLET BY MOUTH EVERY DAY, Disp: 90 tablet, Rfl: 3   pramipexole (MIRAPEX) 0.25 MG tablet, TAKE 1 TABLET BY MOUTH AT BEDTIME., Disp: 90 tablet, Rfl: 1   triamcinolone cream (KENALOG) 0.1 %, APPLY 1 APPLICATION TOPICALLY DAILY AS NEEDED., Disp: 45 g, Rfl: 0   Melatonin 5 MG CAPS, Take 1 capsule by mouth at bedtime as needed. (Patient not taking: Reported on 09/23/2020), Disp: , Rfl:   Review of Systems:  Constitutional: Denies fever, chills, diaphoresis, appetite change and fatigue.  HEENT: Denies photophobia, eye pain, redness, hearing loss, ear pain, congestion, sore throat, rhinorrhea, sneezing, mouth sores, trouble swallowing, neck pain, neck stiffness and tinnitus.   Respiratory: Denies SOB, DOE, cough, chest tightness,  and wheezing.   Cardiovascular: Denies chest pain, palpitations and leg swelling.  Gastrointestinal: Denies nausea, vomiting, abdominal pain, diarrhea, constipation, blood in stool and abdominal distention.  Genitourinary: Denies dysuria, urgency, frequency, hematuria, flank pain and difficulty urinating.   Endocrine: Denies: hot or cold intolerance, sweats, changes in hair or nails, polyuria, polydipsia. Musculoskeletal: Denies myalgias, back pain, joint swelling, arthralgias and gait problem.  Skin: Denies pallor, rash and wound.  Neurological: Denies dizziness, seizures, syncope, weakness, light-headedness, numbness. Hematological: Denies adenopathy. Easy bruising, personal or family bleeding history  Psychiatric/Behavioral: Denies suicidal ideation, mood changes, confusion, nervousness, sleep disturbance and agitation    Physical Exam: Vitals:   09/23/20 1442  BP: 130/80  Pulse: (!) 59  Temp: 98.1 F (36.7 C)  TempSrc: Oral  SpO2: 99%  Weight: 285 lb 1.6 oz (129.3 kg)    Body mass index is 40.91 kg/m.   Constitutional: NAD, calm, comfortable Eyes: PERRL, lids and conjunctivae normal ENMT: Mucous membranes are moist.  Respiratory: clear to auscultation bilaterally, no wheezing, no crackles. Normal respiratory effort. No accessory muscle use.  Cardiovascular: Regular rate and rhythm, no murmurs / rubs / gallops. No extremity edema. Neurologic: Grossly intact and nonfocal Psychiatric: Normal judgment and insight. Alert and oriented x 3. Normal mood.    Impression and Plan:  Analgesic rebound headache -Given the amount and frequency of her Tylenol usage, I suspect this is an analgesic rebound headache. -We have talked about cessation of Tylenol usage not just because of her frequent headaches but also because of possibility of liver toxicity. -I will prescribe her 7.5 mg of meloxicam #30 tablets to take only when headache is extreme.  Have advised her that over the next week or 2 she may notice increased frequency of headaches due to discontinuing Tylenol but that these should level out. -She has no red flag/warning headache signs.  Needs flu shot  - Plan: Flu Vaccine QUAD 6+ mos PF IM (Fluarix Quad PF)  Time spent: 31 minutes reviewing chart, interviewing and examining  patient and formulating plan of care.   Patient Instructions  -Nice seeing you today!!  -STOP taking all tylenol.  -May use meloxicam if extreme headache, but try to go without.     Lelon Frohlich, MD [facility]

## 2020-10-14 ENCOUNTER — Telehealth: Payer: Self-pay

## 2020-10-14 DIAGNOSIS — G4440 Drug-induced headache, not elsewhere classified, not intractable: Secondary | ICD-10-CM

## 2020-10-14 NOTE — Telephone Encounter (Signed)
Patient called wanting to make appt for an office visit on 10/19 but no appts were available patient stated that she is still having headaches and would like a call back to discuss.

## 2020-10-18 NOTE — Telephone Encounter (Signed)
Patient is taking meloxicam (MOBIC) 7.5 MG tablet.    Her headaches have not improved and she is still having dizziness.  Please advise

## 2020-10-19 NOTE — Telephone Encounter (Signed)
Patient has been taking Mobic 15 mg.  Her orthopedic doctor gave her a 30 day supply for the arthritis in her knee. Patient did agree to the referral to Neurology. Referral placed.

## 2020-10-19 NOTE — Addendum Note (Signed)
Addended by: Westley Hummer B on: 10/19/2020 01:27 PM   Modules accepted: Orders

## 2020-10-21 ENCOUNTER — Encounter: Payer: Self-pay | Admitting: Neurology

## 2020-10-24 ENCOUNTER — Other Ambulatory Visit: Payer: Self-pay | Admitting: Internal Medicine

## 2020-10-24 DIAGNOSIS — G4440 Drug-induced headache, not elsewhere classified, not intractable: Secondary | ICD-10-CM

## 2020-11-04 ENCOUNTER — Other Ambulatory Visit: Payer: Self-pay | Admitting: Internal Medicine

## 2020-11-11 ENCOUNTER — Other Ambulatory Visit: Payer: Self-pay | Admitting: Internal Medicine

## 2020-11-11 DIAGNOSIS — Z1231 Encounter for screening mammogram for malignant neoplasm of breast: Secondary | ICD-10-CM

## 2020-11-20 ENCOUNTER — Other Ambulatory Visit: Payer: Self-pay | Admitting: Internal Medicine

## 2020-11-20 DIAGNOSIS — I10 Essential (primary) hypertension: Secondary | ICD-10-CM

## 2020-11-20 DIAGNOSIS — T3995XA Adverse effect of unspecified nonopioid analgesic, antipyretic and antirheumatic, initial encounter: Secondary | ICD-10-CM

## 2020-11-20 DIAGNOSIS — I5032 Chronic diastolic (congestive) heart failure: Secondary | ICD-10-CM

## 2020-11-20 DIAGNOSIS — G4440 Drug-induced headache, not elsewhere classified, not intractable: Secondary | ICD-10-CM

## 2020-11-30 ENCOUNTER — Encounter: Payer: Self-pay | Admitting: Internal Medicine

## 2020-11-30 ENCOUNTER — Ambulatory Visit (INDEPENDENT_AMBULATORY_CARE_PROVIDER_SITE_OTHER): Payer: 59 | Admitting: Internal Medicine

## 2020-11-30 ENCOUNTER — Telehealth: Payer: Self-pay

## 2020-11-30 VITALS — BP 120/88 | HR 67 | Temp 98.6°F | Wt 295.8 lb

## 2020-11-30 DIAGNOSIS — I10 Essential (primary) hypertension: Secondary | ICD-10-CM | POA: Diagnosis not present

## 2020-11-30 DIAGNOSIS — E7800 Pure hypercholesterolemia, unspecified: Secondary | ICD-10-CM | POA: Diagnosis not present

## 2020-11-30 DIAGNOSIS — I5032 Chronic diastolic (congestive) heart failure: Secondary | ICD-10-CM

## 2020-11-30 DIAGNOSIS — R7302 Impaired glucose tolerance (oral): Secondary | ICD-10-CM | POA: Diagnosis not present

## 2020-11-30 DIAGNOSIS — R7303 Prediabetes: Secondary | ICD-10-CM

## 2020-11-30 LAB — POCT GLYCOSYLATED HEMOGLOBIN (HGB A1C): Hemoglobin A1C: 6.2 % — AB (ref 4.0–5.6)

## 2020-11-30 NOTE — Telephone Encounter (Signed)
Patient called asking for a referral to  [facility] [place] Fax [phone] [address], [place], [place]  Patient would like to be scheduled on 12/16 @ 1pm the same time as her mother

## 2020-11-30 NOTE — Telephone Encounter (Signed)
Okay to refer? 

## 2020-11-30 NOTE — Progress Notes (Signed)
Established Patient Office Visit     This visit occurred during the SARS-CoV-2 public health emergency.  Safety protocols were in place, including screening questions prior to the visit, additional usage of staff PPE, and extensive cleaning of exam room while observing appropriate contact time as indicated for disinfecting solutions.    CC/Reason for Visit: 55-month follow-up chronic medical conditions  HPI: Jamie [name] is a 60 y.o. female who is coming in today for the above mentioned reasons. Past Medical History is significant for: Hypertension, morbid obesity, hyperlipidemia, heart failure with preserved ejection fraction, impaired glucose tolerance and obstructive sleep apnea.  She fell a few weeks ago in her kitchen on the left side of her body, still feels sore but has full range of motion of her joints.  She is overdue for COVID booster, she did have her flu vaccine in September.   Past Medical/Surgical History: Past Medical History:  Diagnosis Date   Acquired dilation of ascending aorta and aortic root ([place])    58mm by 2D echo 03/2019   Allergy    Aortic valve disease    functionally bicuspid AV with fusion of the right and left coronary cusps with no AS by echo 03/2020   Arthritis    in back   Chest pain    neg cath 2016 after false positive myoview   Depression    GERD (gastroesophageal reflux disease)    Hyperglycemia    Hyperlipidemia    Hypertension    Joint pain    Knee pain    Low back pain    Migraine    Mitral valve prolapse    Muscle cramps    Pulmonary HTN ([place])    mild with PASP 46mmHg by echo 03/2019 but 75mmHg on echo 03/2020    Past Surgical History:  Procedure Laterality Date   BACK SURGERY     CESAREAN SECTION     LEFT HEART CATHETERIZATION WITH CORONARY ANGIOGRAM N/A 07/08/2013   Procedure: LEFT HEART CATHETERIZATION WITH CORONARY ANGIOGRAM;  Surgeon: Josue Hector, MD;  Location: [facility] CATH LAB;  Service: Cardiovascular;  Laterality: N/A;    TUBAL LIGATION      Social History:  reports that she quit smoking about 24 years ago. Her smoking use included cigarettes. She has never used smokeless tobacco. She reports current alcohol use. She reports that she does not use drugs.  Allergies: No Known Allergies  Family History:  Family History  Problem Relation Age of Onset   Hyperlipidemia Mother    Hypertension Mother    AAA (abdominal aortic aneurysm) Mother    Stroke Father    Hypertension Father    Hyperlipidemia Brother        x2   Hypertension Brother        x2   Liver cancer Maternal Aunt    Breast cancer Cousin    Colon cancer Cousin    Colon polyps Neg Hx    Esophageal cancer Neg Hx    Stomach cancer Neg Hx    Rectal cancer Neg Hx      Current Outpatient Medications:    acetaminophen (TYLENOL) 500 MG tablet, Take 2 tablets (1,000 mg total) by mouth at bedtime., Disp: 30 tablet, Rfl: 0   APPLE CIDER VINEGAR PO, Take by mouth. Goli gummies, Disp: , Rfl:    aspirin EC 81 MG tablet, Take 1 tablet (81 mg total) by mouth daily., Disp:  , Rfl:    atorvastatin (LIPITOR) 80 MG  tablet, Take 1 tablet (80 mg total) by mouth daily., Disp: 90 tablet, Rfl: 3   carvedilol (COREG) 12.5 MG tablet, Take 1/2 tablet by mouth in the morning and 1 tablet by mouth in the evening., Disp: 225 tablet, Rfl: 3   Cholecalciferol (VITAMIN D3) 50 MCG (2000 UT) capsule, Take 2,000 Units by mouth daily., Disp: , Rfl:    diclofenac sodium (VOLTAREN) 1 % GEL, Apply 2 g topically 4 (four) times daily as needed (pain)., Disp: , Rfl:    ELDERBERRY PO, Take by mouth., Disp: , Rfl:    furosemide (LASIX) 20 MG tablet, Take 1 tablet (20 mg total) by mouth daily. You may take 1 extra tablet (20 mg) as needed for increased swelling, Disp: 90 tablet, Rfl: 3   gabapentin (NEURONTIN) 300 MG capsule, Take 300-600 mg by mouth as needed., Disp: , Rfl:    Homeopathic Products (LEG CRAMPS PO), Take by mouth., Disp: , Rfl:    Homeopathic Products ([place]), Apply topically. Spray or moose, Disp: , Rfl:    hydrALAZINE (APRESOLINE) 100 MG tablet, Take 1 tablet (100 mg total) by mouth 3 (three) times daily., Disp: 270 tablet, Rfl: 3   irbesartan-hydrochlorothiazide (AVALIDE) 300-12.5 MG tablet, Take 1 tablet by mouth daily., Disp: 90 tablet, Rfl: 3   losartan (COZAAR) 100 MG tablet, TAKE 1 TABLET BY MOUTH EVERY DAY, Disp: 90 tablet, Rfl: 1   magnesium 30 MG tablet, Take 30 mg by mouth 2 (two) times daily., Disp: , Rfl:    Melatonin 5 MG CAPS, Take 1 capsule by mouth at bedtime as needed., Disp: , Rfl:    meloxicam (MOBIC) 7.5 MG tablet, TAKE 1 TABLET BY MOUTH EVERY DAY, Disp: 30 tablet, Rfl: 2   metoprolol tartrate (LOPRESSOR) 25 MG tablet, TAKE 1 TABLET BY MOUTH TWICE A DAY, Disp: 180 tablet, Rfl: 1   Misc Natural Products (OSTEO BI-FLEX ADV TRIPLE ST PO), Take 1 tablet by mouth 2 (two) times daily. Glucosamine and turmeric, Disp: , Rfl:    pantoprazole (PROTONIX) 40 MG tablet, TAKE 1 TABLET BY MOUTH EVERY DAY, Disp: 90 tablet, Rfl: 3   pramipexole (MIRAPEX) 0.25 MG tablet, TAKE 1 TABLET BY MOUTH AT BEDTIME., Disp: 90 tablet, Rfl: 1   triamcinolone cream (KENALOG) 0.1 %, APPLY 1 APPLICATION TOPICALLY DAILY AS NEEDED., Disp: 45 g, Rfl: 0  Review of Systems:  Constitutional: Denies fever, chills, diaphoresis, appetite change and fatigue.  HEENT: Denies photophobia, eye pain, redness, hearing loss, ear pain, congestion, sore throat, rhinorrhea, sneezing, mouth sores, trouble swallowing, neck pain, neck stiffness and tinnitus.   Respiratory: Denies SOB, DOE, cough, chest tightness,  and wheezing.   Cardiovascular: Denies chest pain, palpitations and leg swelling.  Gastrointestinal: Denies nausea, vomiting, abdominal pain, diarrhea, constipation, blood in stool and abdominal distention.  Genitourinary: Denies dysuria, urgency, frequency, hematuria, flank pain and difficulty urinating.  Endocrine: Denies: hot or cold intolerance, sweats, changes  in hair or nails, polyuria, polydipsia. Musculoskeletal: Denies myalgias, back pain, joint swelling, arthralgias and gait problem.  Skin: Denies pallor, rash and wound.  Neurological: Denies dizziness, seizures, syncope, weakness, light-headedness, numbness and headaches.  Hematological: Denies adenopathy. Easy bruising, personal or family bleeding history  Psychiatric/Behavioral: Denies suicidal ideation, mood changes, confusion, nervousness, sleep disturbance and agitation    Physical Exam: Vitals:   11/30/20 0710  BP: 120/88  Pulse: 67  Temp: 98.6 F (37 C)  TempSrc: Oral  SpO2: 96%  Weight: 295 lb 12.8 oz (134.2 kg)  Body mass index is 42.44 kg/m.   Constitutional: NAD, calm, comfortable Eyes: PERRL, lids and conjunctivae normal ENMT: Mucous membranes are moist.  Respiratory: clear to auscultation bilaterally, no wheezing, no crackles. Normal respiratory effort. No accessory muscle use.  Cardiovascular: Regular rate and rhythm, no murmurs / rubs / gallops. No extremity edema.  Neurologic: Grossly intact and nonfocal Psychiatric: Normal judgment and insight. Alert and oriented x 3. Normal mood.    Impression and Plan:  IGT (impaired glucose tolerance)  - Plan: POCT glycosylated hemoglobin (Hb A1C) -A1c has increased to 6.2, she has also gained about 10 pounds since last visit. -We have discussed importance of lifestyle modifications, return for follow-up in 6 months.  Primary hypertension -Well-controlled.  Pure hypercholesterolemia -Last lipid panel in May 2022 with a total cholesterol of 122, triglycerides 76 and LDL 60.  She is on atorvastatin 80 mg daily.  Morbid obesity ([place]) -Discussed healthy lifestyle, including increased physical activity and better food choices to promote weight loss.  Chronic diastolic CHF (congestive heart failure) (H[facility]) -Currently compensated, on ARB, carvedilol, statin.  Time spent: 31 minutes reviewing chart, interviewing and  examining patient and formulating plan of care.    Lelon Frohlich, MD [facility]

## 2020-12-01 NOTE — Telephone Encounter (Signed)
Referral placed.

## 2020-12-01 NOTE — Addendum Note (Signed)
Addended by: Westley Hummer B on: 12/01/2020 10:42 AM   Modules accepted: Orders

## 2020-12-02 ENCOUNTER — Encounter: Payer: Self-pay | Admitting: Neurology

## 2020-12-02 ENCOUNTER — Ambulatory Visit (INDEPENDENT_AMBULATORY_CARE_PROVIDER_SITE_OTHER): Payer: 59 | Admitting: Neurology

## 2020-12-02 ENCOUNTER — Other Ambulatory Visit: Payer: Self-pay

## 2020-12-02 VITALS — BP 146/86 | HR 68 | Ht 70.0 in | Wt 292.4 lb

## 2020-12-02 DIAGNOSIS — G43011 Migraine without aura, intractable, with status migrainosus: Secondary | ICD-10-CM | POA: Diagnosis not present

## 2020-12-02 DIAGNOSIS — G43019 Migraine without aura, intractable, without status migrainosus: Secondary | ICD-10-CM | POA: Diagnosis not present

## 2020-12-02 DIAGNOSIS — G4733 Obstructive sleep apnea (adult) (pediatric): Secondary | ICD-10-CM

## 2020-12-02 DIAGNOSIS — Z9989 Dependence on other enabling machines and devices: Secondary | ICD-10-CM

## 2020-12-02 MED ORDER — ONDANSETRON 4 MG PO TBDP: 4.0000 mg | ORAL_TABLET | Freq: Three times a day (TID) | ORAL | 5 refills | Status: DC | PRN

## 2020-12-02 MED ORDER — RIZATRIPTAN BENZOATE 10 MG PO TBDP: ORAL_TABLET | ORAL | 5 refills | Status: DC

## 2020-12-02 NOTE — Progress Notes (Signed)
NEUROLOGY CONSULTATION NOTE  Jamie [name] MRN: [id number] DOB: 08/02/1960  Referring provider: Lelon Frohlich, MD Primary care provider: Lelon Frohlich, MD  Reason for consult:  headache  Assessment/Plan:   Suspect migraine without aura, with status migrainosus, intractable - the fact that they occur once a year and possibly with change in season suggests migraine.  She does not exhibit features to suspect that these are cluster headaches. Migraine without aura, without status migrainosus, not intractable OSA  She would rather avoid a daily preventative. As these are longstanding headaches, and exam is non-focal, I do not think imaging is warranted at this time. If she should begin another intractable daily "cluster" she will contact me and we will try to break it with an extended prednisone taper (treating it like I would for episodic cluster headaches).  If we do this, she was advised to take omeprazole (or limit/discontinue use of NSAIDs while on taper) and monitor blood sugar. Will prescribe rizatriptan 10mg  and Zofran 4mg  to take as abortive therapy for her typical migraines Limit use of pain relievers to no more than 2 days out of week to prevent risk of rebound or medication-overuse headache. Keep headache diary Continue use of CPAP Follow up one year or as needed.   Subjective:  Jamie [name] is a 60 year old female with CHF, aortic valve disease, MVP, HTN, HLD, pulmonary HTN who presents for headaches.  History supplemented by referring provider's note.  She started having these intractable headaches in her 81s.  They usually occur once a year but not necessarily same time of year.  Most recent episode started in September and lasted 2 months.  She describes a daily headache.  A persistent dull headache with severe episodes occurring 15 days out of the month.  It is a holocephalic pressure/pounding with photophobia and osmophobia but no nausea,  phonophobia, autonomic symptoms, numbness or weakness.  She sees spots, but she has that all of the time.  She treated with Tylenol, which she has been taking daily for years to treat her arthritis.  Her PCP changed it to meloxicam which she continues to take daily.  No known triggers.  She has these headaches once a year but not necessarily same time of year.  She can't recall if they only occur with change in seasons.    She also has history of migraines since her 25s.  The are severe pounding pain involving frontal region/occipital region and right side of face.  Associated with nausea, photophobia, phonophobia and osmophobia.  They last 2 to 3 days and occur 6 times a year.  Past Imaging (personally reviewed): 10/06/2017 CT HEAD WO:  Normal  Current NSAIDS/analgesics: Mobic Current triptans:  none Current ergotamine:  none Current anti-emetic:  none Current muscle relaxants:  none Current Antihypertensive medications:  metoprolol tartrate, carvedilol, furosemide, hydralazine,irbesartan-HCTZ, losartan Current Antidepressant medications:  none Current Anticonvulsant medications:  gabapentin 300-600mg  PRN back pain) Current anti-CGRP:  none Current Vitamins/Herbal/Supplements:  Mg, melatonin, elderberry Current Antihistamines/Decongestants:  none Other therapy:  none Hormone/birth control:  none  Past NSAIDS/analgesics:  ibuprofen, acetaminophen, tramadol Past abortive triptans:  none Past abortive ergotamine:  none Past muscle relaxants:  cyclobenzaprine, tizanidine Past anti-emetic:  none Past antihypertensive medications:  amlodipine Past antidepressant medications:  none Past anticonvulsant medications:  none Past anti-CGRP:  none Past vitamins/Herbal/Supplements:  none Past antihistamines/decongestants:  none Other past therapies:  none  Caffeine:  1 cup of coffee daily.  Sometimes soda and tea Diet:  Sometimes soda.  3 to 5 bottles water daily.  Skips meals Exercise:  was  exercising but had to stop 6 weeks ago due to injury Depression:  no; Anxiety:  no Other pain:  back and knee pain Sleep hygiene:  Has OSA - uses CPAP at least 4 hours a night. Family history of headache:  Brother.  No family history of aneurysm.     PAST MEDICAL HISTORY: Past Medical History:  Diagnosis Date   Acquired dilation of ascending aorta and aortic root ([place])    97mm by 2D echo 03/2019   Allergy    Aortic valve disease    functionally bicuspid AV with fusion of the right and left coronary cusps with no AS by echo 03/2020   Arthritis    in back   Chest pain    neg cath 2016 after false positive myoview   Depression    GERD (gastroesophageal reflux disease)    Hyperglycemia    Hyperlipidemia    Hypertension    Joint pain    Knee pain    Low back pain    Migraine    Mitral valve prolapse    Muscle cramps    Pulmonary HTN ([place])    mild with PASP 64mmHg by echo 03/2019 but 60mmHg on echo 03/2020    PAST SURGICAL HISTORY: Past Surgical History:  Procedure Laterality Date   BACK SURGERY     CESAREAN SECTION     LEFT HEART CATHETERIZATION WITH CORONARY ANGIOGRAM N/A 07/08/2013   Procedure: LEFT HEART CATHETERIZATION WITH CORONARY ANGIOGRAM;  Surgeon: Josue Hector, MD;  Location: [facility] CATH LAB;  Service: Cardiovascular;  Laterality: N/A;   TUBAL LIGATION      MEDICATIONS: Current Outpatient Medications on File Prior to Visit  Medication Sig Dispense Refill   acetaminophen (TYLENOL) 500 MG tablet Take 2 tablets (1,000 mg total) by mouth at bedtime. 30 tablet 0   APPLE CIDER VINEGAR PO Take by mouth. [place] gummies     aspirin EC 81 MG tablet Take 1 tablet (81 mg total) by mouth daily.     atorvastatin (LIPITOR) 80 MG tablet Take 1 tablet (80 mg total) by mouth daily. 90 tablet 3   carvedilol (COREG) 12.5 MG tablet Take 1/2 tablet by mouth in the morning and 1 tablet by mouth in the evening. 225 tablet 3   Cholecalciferol (VITAMIN D3) 50 MCG (2000 UT) capsule Take 2,000  Units by mouth daily.     diclofenac sodium (VOLTAREN) 1 % GEL Apply 2 g topically 4 (four) times daily as needed (pain).     ELDERBERRY PO Take by mouth.     furosemide (LASIX) 20 MG tablet Take 1 tablet (20 mg total) by mouth daily. You may take 1 extra tablet (20 mg) as needed for increased swelling 90 tablet 3   gabapentin (NEURONTIN) 300 MG capsule Take 300-600 mg by mouth as needed.     Homeopathic Products (LEG CRAMPS PO) Take by mouth.     Homeopathic Products [facility] RELIEF EX) Apply topically. Spray or moose     hydrALAZINE (APRESOLINE) 100 MG tablet Take 1 tablet (100 mg total) by mouth 3 (three) times daily. 270 tablet 3   irbesartan-hydrochlorothiazide (AVALIDE) 300-12.5 MG tablet Take 1 tablet by mouth daily. 90 tablet 3   losartan (COZAAR) 100 MG tablet TAKE 1 TABLET BY MOUTH EVERY DAY 90 tablet 1   magnesium 30 MG tablet Take 30 mg by mouth 2 (two) times daily.  Melatonin 5 MG CAPS Take 1 capsule by mouth at bedtime as needed.     meloxicam (MOBIC) 7.5 MG tablet TAKE 1 TABLET BY MOUTH EVERY DAY 30 tablet 2   metoprolol tartrate (LOPRESSOR) 25 MG tablet TAKE 1 TABLET BY MOUTH TWICE A DAY 180 tablet 1   Misc Natural Products (OSTEO BI-FLEX ADV TRIPLE ST PO) Take 1 tablet by mouth 2 (two) times daily. Glucosamine and turmeric     pantoprazole (PROTONIX) 40 MG tablet TAKE 1 TABLET BY MOUTH EVERY DAY 90 tablet 3   pramipexole (MIRAPEX) 0.25 MG tablet TAKE 1 TABLET BY MOUTH AT BEDTIME. 90 tablet 1   triamcinolone cream (KENALOG) 0.1 % APPLY 1 APPLICATION TOPICALLY DAILY AS NEEDED. 45 g 0   No current facility-administered medications on file prior to visit.    ALLERGIES: No Known Allergies  FAMILY HISTORY: Family History  Problem Relation Age of Onset   Hyperlipidemia Mother    Hypertension Mother    AAA (abdominal aortic aneurysm) Mother    Stroke Father    Hypertension Father    Hyperlipidemia Brother        x2   Hypertension Brother        x2   Liver cancer  Maternal Aunt    Breast cancer Cousin    Colon cancer Cousin    Colon polyps Neg Hx    Esophageal cancer Neg Hx    Stomach cancer Neg Hx    Rectal cancer Neg Hx     Objective:  Blood pressure (!) 146/86, pulse 68, height 5\' 10"  (1.778 m), weight 292 lb 6.4 oz (132.6 kg), last menstrual period 10/04/2014, SpO2 99 %. General: No acute distress.  Patient appears well-groomed.   Head:  Normocephalic/atraumatic Eyes:  fundi examined but not visualized Neck: supple, no paraspinal tenderness, full range of motion Back: No paraspinal tenderness Heart: regular rate and rhythm Lungs: Clear to auscultation bilaterally. Vascular: No carotid bruits. Neurological Exam: Mental status: alert and oriented to person, place, and time, recent and remote memory intact, fund of knowledge intact, attention and concentration intact, speech fluent and not dysarthric, language intact. Cranial nerves: CN I: not tested CN II: pupils equal, round and reactive to light, visual fields intact CN III, IV, VI:  full range of motion, no nystagmus, no ptosis CN V: facial sensation intact. CN VII: upper and lower face symmetric CN VIII: hearing intact CN IX, X: gag intact, uvula midline CN XI: sternocleidomastoid and trapezius muscles intact CN XII: tongue midline Bulk & Tone: normal, no fasciculations. Motor:  muscle strength 5/5 throughout Sensation:  Pinprick, temperature and vibratory sensation intact. Deep Tendon Reflexes:  2+ throughout,  toes downgoing.   Finger to nose testing:  Without dysmetria.   Heel to shin:  Without dysmetria.   Gait:  Normal station and stride.  Romberg negative.    Thank you for allowing me to take part in the care of this patient.  Metta Clines, DO  CC: Lelon Frohlich, MD

## 2020-12-02 NOTE — Patient Instructions (Signed)
  If you get one of those daily headache spells, contact me and we can start a steroid taper. Take rizatriptan at earliest onset of headache.  May repeat dose once in 2 hours if needed.  Maximum 2 tablets in 24 hours. Take ondansetron for nausea Limit use of pain relievers to no more than 2 days out of the week.  These medications include acetaminophen, NSAIDs (ibuprofen/Advil/Motrin, naproxen/Aleve, triptans (Imitrex/sumatriptan), Excedrin, and narcotics.  This will help reduce risk of rebound headaches. Be aware of common food triggers:  - Caffeine:  coffee, black tea, cola, Mt. Dew  - Chocolate  - Dairy:  aged cheeses (brie, blue, cheddar, gouda, [place], provolone, [place], Swiss, etc), chocolate milk, buttermilk, sour cream, limit eggs and yogurt  - Nuts, peanut butter  - Alcohol  - Cereals/grains:  FRESH breads (fresh bagels, sourdough, doughnuts), yeast productions  - Processed/canned/aged/cured meats (pre-packaged deli meats, hotdogs)  - MSG/glutamate:  soy sauce, flavor enhancer, pickled/preserved/marinated foods  - Sweeteners:  aspartame (Equal, Nutrasweet).  Sugar and Splenda are okay  - Vegetables:  legumes (lima beans, lentils, snow peas, fava beans, pinto peans, peas, garbanzo beans), sauerkraut, onions, olives, pickles  - Fruit:  avocados, bananas, citrus fruit (orange, lemon, grapefruit), mango  - Other:  Frozen meals, macaroni and cheese Routine exercise Stay adequately hydrated (aim for 64 oz water daily) Keep headache diary Maintain proper stress management Maintain proper sleep hygiene Do not skip meals Consider supplements:  magnesium citrate 400mg  daily, riboflavin 400mg  daily, coenzyme Q10 100mg  three times daily.

## 2020-12-15 ENCOUNTER — Ambulatory Visit
Admission: RE | Admit: 2020-12-15 | Discharge: 2020-12-15 | Disposition: A | Discharge status: Home / Self Care | Payer: 59 | Source: Ambulatory Visit | Attending: Internal Medicine | Admitting: Internal Medicine

## 2020-12-15 DIAGNOSIS — Z1231 Encounter for screening mammogram for malignant neoplasm of breast: Secondary | ICD-10-CM

## 2021-01-11 ENCOUNTER — Ambulatory Visit: Payer: 59 | Admitting: Neurology

## 2021-01-23 ENCOUNTER — Ambulatory Visit: Payer: 59 | Admitting: Neurology

## 2021-01-25 ENCOUNTER — Telehealth: Payer: Self-pay | Admitting: Pharmacist

## 2021-01-25 NOTE — Telephone Encounter (Signed)
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 Submitted new prior authorization request and subsequent appeals for pt to restart therapy.

## 2021-01-26 ENCOUNTER — Telehealth: Payer: Self-pay

## 2021-01-26 ENCOUNTER — Telehealth: Payer: Self-pay | Admitting: Internal Medicine

## 2021-01-26 ENCOUNTER — Telehealth: Payer: Self-pay | Admitting: Cardiology

## 2021-01-26 NOTE — Telephone Encounter (Signed)
Disability Parking Placard to be filled out--placed in dr's folder.  Fax to [phone] and call [phone] upon completion.

## 2021-01-26 NOTE — Telephone Encounter (Signed)
Left voicemail for patient to call the office in regards to why patient needs a handicap placard?  And does she have any physical disabilities that Dr. Jerilee Hoh is not aware of?

## 2021-01-26 NOTE — Telephone Encounter (Signed)
Spoke with the patient who states that she went to pick up her medications from the pharmacy and her Avalide was going to cost her $134 for a 3 month supply. She states that she has never had to pay that much previously. She states that it usually only cost her $40 for 3 months. She is going to call her pharmacy back to make sure that they were running it through her insurance.  Advised that I would also reach out to our pharmacy team to check on to see if she is able to get it at a better price.

## 2021-01-26 NOTE — Telephone Encounter (Signed)
Pt reaching out with questions in regards to her medications... please advise

## 2021-01-26 NOTE — Telephone Encounter (Signed)
Last OV 11/30/20. Document placed in PCP red folder for signature.

## 2021-01-26 NOTE — Telephone Encounter (Signed)
From what I can tell, it looks like her irbesartan-HCTZ shuld be a tier 1 medication so that seems overpriced.  Do not know all the details of her plan however. Recommend contacting her pharmacy and possibly her insurance company as well

## 2021-01-27 NOTE — Telephone Encounter (Signed)
LVM instructions for pt to call back.  There is no documented need in EMR on rationale for handicap placard.

## 2021-01-30 ENCOUNTER — Encounter: Payer: 59 | Attending: Internal Medicine | Admitting: Dietician

## 2021-01-30 ENCOUNTER — Other Ambulatory Visit: Payer: Self-pay

## 2021-01-30 ENCOUNTER — Encounter: Payer: Self-pay | Admitting: Dietician

## 2021-01-30 DIAGNOSIS — I5032 Chronic diastolic (congestive) heart failure: Secondary | ICD-10-CM | POA: Diagnosis not present

## 2021-01-30 DIAGNOSIS — I10 Essential (primary) hypertension: Secondary | ICD-10-CM | POA: Diagnosis present

## 2021-01-30 DIAGNOSIS — E7800 Pure hypercholesterolemia, unspecified: Secondary | ICD-10-CM

## 2021-01-30 DIAGNOSIS — R7303 Prediabetes: Secondary | ICD-10-CM

## 2021-01-30 NOTE — Patient Instructions (Addendum)
Consider resuming Vitamin D (1000-2000 units daily) Consider 600 mg ALA twice daily (for neuropathy) (food sources include:  ground flax seeds or chia seeds)  Aim to be active most days (30 minutes or more) Continued mindfulness with meals Choose foods without added salt Find ways to increase your vegetable intake  Consider label reading

## 2021-01-30 NOTE — Telephone Encounter (Signed)
Pt returned call and stated that Dr.Hernandez gave her a form due to knee issues. Pt is wanting to know if she can have this renewed bc her knee is till causing pain.

## 2021-01-30 NOTE — Progress Notes (Signed)
Medical Nutrition Therapy  Appointment Start time:  [phone]  Appointment End time:  0910  Primary concerns today: Patient saw me on a visit with her mother and would like this visit to be focused more on her own health needs.  They have begun eating healthier and less.  She states that her mom's A1C has come down and she did not need medication. She wants to have breast reduction surgery.    Referral diagnosis: impaired glucose tolerance, prediabetes, CHF, pure hypercholesterolemia, morbid obesity Preferred learning style:  no preference indicated Learning readiness: ready, change in progress)  NUTRITION ASSESSMENT   Anthropometrics  71" 296 lbs 01/30/2021  She states that she has regained what she has lost when she was exercising consistently. 282 lbs 10/2020 200 lbs lowest adult weight.  Clinical Medical Hx: Prediabetes, obesity, OSA- cpap, HTN, HLD Medications: see list Labs: A1C 6.2% 11/2020 increased from 6% 05/25/2020, vitamin D 31 on 04/25/2020 Notable Signs/Symptoms: neuropathy pain  Lifestyle & Dietary Hx Patient is currently living with her mother. They are working on healthier eating. Was exercising a lot but fell and hurt her left side in October 2022 and is now trying to get back into it with bands and plans on restarting at [facility] and is considering classes at the [facility]. 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  Estimated daily fluid intake: 90 oz Supplements: vitamin D Sleep: 4 - 6 hours (back issues, neuropathy, and other discomfort) Stress / self-care: moderate   Current average weekly physical activity: limited and to resume  24-Hr Dietary Recall First Meal: Special K cereal OR oatmeal and [place] bacon OR greek yogurt and [place] bacon Snack: occasional fruit (apple or banana) Second Meal: chicken salad on bread, baked chips OR soup Snack: none Third Meal: soup, occasional crackers OR cabbage, lima beans, okra, white rice, gravy,  tenderloin Snack: occasional handful of nuts, lightly salted Beverages: water, coffee with regular creamer, regular Dr. Malachi Bonds or Sprite, ginger tea with occasional honey    NUTRITION DIAGNOSIS  NB-1.1 Food and nutrition-related knowledge deficit As related to balance of carbohydrates, protein, and fat.  As evidenced by patient report and diet hx.   NUTRITION INTERVENTION  Nutrition education (E-1) on the following topics:  Insulin resistance and how to improve Benefits of exercise Balance of carbohydrates, protein, fat Saturated fat sources  Beverage choices A1C diagnostic labs and goals Sodium and CHF Supplement options for neuropathy and adequate vitamin D Mindfulness with meal choices  Handouts Provided Include  How to Thrive:  A Guide for Your Journey with Diabetes by the ADA Meal Plan Card My Plate Snack list Label reading Diabetes Resources  Learning Style & Readiness for Change Teaching method utilized: Visual & Auditory  Demonstrated degree of understanding via: Teach Back  Barriers to learning/adherence to lifestyle change: motivation  Goals Established by Pt Aim to be active most days (30 minutes or more) Continued mindfulness with meals Choose foods without added salt Find ways to increase your vegetable intake Consider label reading Consider resuming Vitamin D (1000-2000 units daily) Consider 600 mg ALA twice daily (for neuropathy) (food sources include:  ground flax seeds or chia seeds)   MONITORING & EVALUATION Dietary intake, weekly physical activity, and label reading prn.  Next Steps  Patient is to call for questions.

## 2021-01-30 NOTE — Telephone Encounter (Signed)
Noted!  Called pt to advise of update but no answer.

## 2021-01-31 NOTE — Telephone Encounter (Signed)
Patient notified of update  and verbalized understanding. Pt is wanting the form mail to home. Form mailed.

## 2021-02-01 NOTE — Telephone Encounter (Signed)
Spoke with the patient and she is waiting for her husband to find their new insurance card. She states that once he finds it she is going to take it to the pharmacy to see if the price is better.

## 2021-02-07 MED ORDER — WEGOVY 0.25 MG/0.5ML ~~LOC~~ SOAJ: 0.2500 mg | SUBCUTANEOUS | 0 refills | Status: DC

## 2021-02-07 NOTE — Addendum Note (Signed)
Addended by: Blythe Veach E on: 02/07/2021 10:07 AM   Modules accepted: Orders

## 2021-02-07 NOTE — Telephone Encounter (Signed)
[facility] appeal approved. Spoke with pt, rx sent to pharmacy for first month starting dose. She notes that she had some itching on Wegovy last year for a few days after the injection but is willing to retry and will monitor for any symptoms. Will call pt in 3-4 weeks to follow up with tolerability.

## 2021-02-20 DIAGNOSIS — M25561 Pain in right knee: Secondary | ICD-10-CM | POA: Diagnosis not present

## 2021-02-20 DIAGNOSIS — M25562 Pain in left knee: Secondary | ICD-10-CM | POA: Diagnosis not present

## 2021-03-01 ENCOUNTER — Telehealth: Payer: Self-pay | Admitting: Pharmacist

## 2021-03-01 NOTE — Telephone Encounter (Signed)
Called pt and left message to follow up with [facility] tolerability. Previously experienced itching when she tried it last year before national shortage caused her to stop therapy. Rechallenged last month and will be due for dose increase up to 0.5mg  weekly if tolerating well. ?

## 2021-03-03 MED ORDER — WEGOVY 0.5 MG/0.5ML ~~LOC~~ SOAJ: 0.5000 mg | SUBCUTANEOUS | 0 refills | Status: DC

## 2021-03-03 NOTE — Telephone Encounter (Signed)
2nd message left for patient. Will go ahead and send in next rx for Wegovy 0.5mg  dose assuming pt is still taking and tolerating Wegovy as she will run out of medication soon otherwise. ?

## 2021-03-10 NOTE — Telephone Encounter (Signed)
3rd call made to pt, left another voicemail. Will await pt's return call at this time for further dose titration. ?

## 2021-03-23 ENCOUNTER — Other Ambulatory Visit: Payer: Self-pay | Admitting: Internal Medicine

## 2021-03-23 DIAGNOSIS — G4440 Drug-induced headache, not elsewhere classified, not intractable: Secondary | ICD-10-CM

## 2021-04-25 DIAGNOSIS — Z780 Asymptomatic menopausal state: Secondary | ICD-10-CM | POA: Diagnosis not present

## 2021-04-25 DIAGNOSIS — Z6841 Body Mass Index (BMI) 40.0 and over, adult: Secondary | ICD-10-CM | POA: Diagnosis not present

## 2021-04-25 DIAGNOSIS — Z01419 Encounter for gynecological examination (general) (routine) without abnormal findings: Secondary | ICD-10-CM | POA: Diagnosis not present

## 2021-04-26 ENCOUNTER — Other Ambulatory Visit: Payer: Self-pay | Admitting: Cardiology

## 2021-04-26 ENCOUNTER — Other Ambulatory Visit: Payer: Self-pay | Admitting: Internal Medicine

## 2021-04-26 DIAGNOSIS — I5032 Chronic diastolic (congestive) heart failure: Secondary | ICD-10-CM

## 2021-04-26 DIAGNOSIS — I10 Essential (primary) hypertension: Secondary | ICD-10-CM

## 2021-05-08 DIAGNOSIS — M170 Bilateral primary osteoarthritis of knee: Secondary | ICD-10-CM | POA: Diagnosis not present

## 2021-05-19 ENCOUNTER — Other Ambulatory Visit: Payer: Self-pay | Admitting: Cardiology

## 2021-05-24 DIAGNOSIS — M170 Bilateral primary osteoarthritis of knee: Secondary | ICD-10-CM | POA: Diagnosis not present

## 2021-05-31 ENCOUNTER — Ambulatory Visit (INDEPENDENT_AMBULATORY_CARE_PROVIDER_SITE_OTHER): Payer: BC Managed Care – PPO | Admitting: Internal Medicine

## 2021-05-31 ENCOUNTER — Other Ambulatory Visit: Payer: Self-pay | Admitting: Internal Medicine

## 2021-05-31 ENCOUNTER — Encounter: Payer: Self-pay | Admitting: Internal Medicine

## 2021-05-31 VITALS — BP 140/88 | HR 59 | Temp 98.0°F | Ht 69.0 in | Wt 297.0 lb

## 2021-05-31 DIAGNOSIS — G8929 Other chronic pain: Secondary | ICD-10-CM

## 2021-05-31 DIAGNOSIS — I10 Essential (primary) hypertension: Secondary | ICD-10-CM | POA: Diagnosis not present

## 2021-05-31 DIAGNOSIS — Z0000 Encounter for general adult medical examination without abnormal findings: Secondary | ICD-10-CM

## 2021-05-31 DIAGNOSIS — E7800 Pure hypercholesterolemia, unspecified: Secondary | ICD-10-CM

## 2021-05-31 DIAGNOSIS — N950 Postmenopausal bleeding: Secondary | ICD-10-CM | POA: Diagnosis not present

## 2021-05-31 DIAGNOSIS — M546 Pain in thoracic spine: Secondary | ICD-10-CM | POA: Diagnosis not present

## 2021-05-31 DIAGNOSIS — I5032 Chronic diastolic (congestive) heart failure: Secondary | ICD-10-CM

## 2021-05-31 DIAGNOSIS — E1169 Type 2 diabetes mellitus with other specified complication: Secondary | ICD-10-CM

## 2021-05-31 LAB — VITAMIN B12: Vitamin B-12: 514 pg/mL (ref 211–911)

## 2021-05-31 LAB — COMPREHENSIVE METABOLIC PANEL
ALT: 29 U/L (ref 0–35)
AST: 16 U/L (ref 0–37)
Albumin: 4.1 g/dL (ref 3.5–5.2)
Alkaline Phosphatase: 64 U/L (ref 39–117)
BUN: 15 mg/dL (ref 6–23)
CO2: 28 mEq/L (ref 19–32)
Calcium: 9.2 mg/dL (ref 8.4–10.5)
Chloride: 109 mEq/L (ref 96–112)
Creatinine, Ser: 0.93 mg/dL (ref 0.40–1.20)
GFR: 67.32 mL/min (ref >60.00)
Glucose, Bld: 104 mg/dL — ABNORMAL HIGH (ref 70–99)
Potassium: 3.9 mEq/L (ref 3.5–5.1)
Sodium: 143 mEq/L (ref 135–145)
Total Bilirubin: 0.7 mg/dL (ref 0.2–1.2)
Total Protein: 6.9 g/dL (ref 6.0–8.3)

## 2021-05-31 LAB — CBC WITH DIFFERENTIAL/PLATELET
Basophils Absolute: 0 10*3/uL (ref 0.0–0.1)
Basophils Relative: 0.5 % (ref 0.0–3.0)
Eosinophils Absolute: 0.1 10*3/uL (ref 0.0–0.7)
Eosinophils Relative: 2.9 % (ref 0.0–5.0)
HCT: 36.1 % (ref 36.0–46.0)
Hemoglobin: 11.9 g/dL — ABNORMAL LOW (ref 12.0–15.0)
Lymphocytes Relative: 38.1 % (ref 12.0–46.0)
Lymphs Abs: 2 10*3/uL (ref 0.7–4.0)
MCHC: 33 g/dL (ref 30.0–36.0)
MCV: 89.2 fl (ref 78.0–100.0)
Monocytes Absolute: 0.3 10*3/uL (ref 0.1–1.0)
Monocytes Relative: 5.2 % (ref 3.0–12.0)
Neutro Abs: 2.7 10*3/uL (ref 1.4–7.7)
Neutrophils Relative %: 53.3 % (ref 43.0–77.0)
Platelets: 176 10*3/uL (ref 150.0–400.0)
RBC: 4.05 Mil/uL (ref 3.87–5.11)
RDW: 16.5 % — ABNORMAL HIGH (ref 11.5–15.5)
WBC: 5.1 10*3/uL (ref 4.0–10.5)

## 2021-05-31 LAB — LIPID PANEL
Cholesterol: 133 mg/dL (ref 0–200)
HDL: 59.4 mg/dL (ref >39.00)
LDL Cholesterol: 59 mg/dL (ref 0–99)
NonHDL: 73.87
Total CHOL/HDL Ratio: 2
Triglycerides: 74 mg/dL (ref 0.0–149.0)
VLDL: 14.8 mg/dL (ref 0.0–40.0)

## 2021-05-31 LAB — VITAMIN D 25 HYDROXY (VIT D DEFICIENCY, FRACTURES): VITD: 31.63 ng/mL (ref 30.00–100.00)

## 2021-05-31 LAB — TSH: TSH: 2.97 u[IU]/mL (ref 0.35–5.50)

## 2021-05-31 LAB — HEMOGLOBIN A1C: Hgb A1c MFr Bld: 6.7 % — ABNORMAL HIGH (ref 4.6–6.5)

## 2021-05-31 MED ORDER — EMPAGLIFLOZIN 10 MG PO TABS: 10.0000 mg | ORAL_TABLET | Freq: Every day | ORAL | 1 refills | Status: DC

## 2021-05-31 NOTE — Progress Notes (Signed)
Established Patient Office Visit     CC/Reason for Visit: Annual preventive exam, discuss acute concerns  HPI: Jamie [name] is a 61 y.o. female who is coming in today for the above mentioned reasons. Past Medical History is significant for: Hypertension, hyperlipidemia, morbid obesity, impaired glucose tolerance, obstructive sleep apnea, heart failure with preserved ejection fraction.  She has routine eye and dental care.  She has not been exercising.  She has not had a menstrual period in 7 years, this month has had some vaginal bleeding.  She is requesting referral to plastic surgery to consider a breast reduction.  She has very large, pendulous breast and is starting to have some significant back pain related to this.  She is overdue for a COVID-vaccine.  She had a mammogram in December 2022, a Pap smear in February and a colonoscopy in 2019.  Blood pressure is noted to be elevated on 2 separate takes today.  She is not sure she is taking all medications as prescribed.   Past Medical/Surgical History: Past Medical History:  Diagnosis Date   Acquired dilation of ascending aorta and aortic root ([place])    85m by 2D echo 03/2019   Allergy    Aortic valve disease    functionally bicuspid AV with fusion of the right and left coronary cusps with no AS by echo 03/2020   Arthritis    in back   Chest pain    neg cath 2016 after false positive myoview   Depression    GERD (gastroesophageal reflux disease)    Hyperglycemia    Hyperlipidemia    Hypertension    Joint pain    Knee pain    Low back pain    Migraine    Mitral valve prolapse    Muscle cramps    Prediabetes    Pulmonary HTN ([place]    mild with PASP 371mg by echo 03/2019 but 2614m on echo 03/2020    Past Surgical History:  Procedure Laterality Date   BACK SURGERY     CESAREAN SECTION     LEFT HEART CATHETERIZATION WITH CORONARY ANGIOGRAM N/A 07/08/2013   Procedure: LEFT HEART CATHETERIZATION WITH CORONARY  ANGIOGRAM;  Surgeon: PetJosue HectorD;  [facility];  Service: Cardiovascular;  Laterality: N/A;   TUBAL LIGATION      Social History:  reports that she quit smoking about 24 years ago. Her smoking use included cigarettes. She has never used smokeless tobacco. She reports current alcohol use. She reports that she does not use drugs.  Allergies: No Known Allergies  Family History:  Family History  Problem Relation Age of Onset   Hyperlipidemia Mother    Hypertension Mother    AAA (abdominal aortic aneurysm) Mother    Stroke Father    Hypertension Father    Hyperlipidemia Brother        x2   Hypertension Brother        x2   Liver cancer Maternal Aunt    Breast cancer Cousin    Colon cancer Cousin    Colon polyps Neg Hx    Esophageal cancer Neg Hx    Stomach cancer Neg Hx    Rectal cancer Neg Hx      Current Outpatient Medications:    atorvastatin (LIPITOR) 80 MG tablet, Take 1 tablet (80 mg total) by mouth daily., Disp: 90 tablet, Rfl: 3   carvedilol (COREG) 12.5 MG tablet, TAKE 1/2 TABLET BY MOUTH IN THE MORNING  AND 1 TABLET BY MOUTH IN THE EVENING., Disp: 45 tablet, Rfl: 1   Cholecalciferol (VITAMIN D3) 50 MCG (2000 UT) capsule, Take 2,000 Units by mouth daily., Disp: , Rfl:    furosemide (LASIX) 20 MG tablet, Take 1 tablet (20 mg total) by mouth daily. You may take 1 extra tablet (20 mg) as needed for increased swelling, Disp: 90 tablet, Rfl: 3   gabapentin (NEURONTIN) 300 MG capsule, Take 300-600 mg by mouth as needed., Disp: , Rfl:    Homeopathic Products (LEG CRAMPS PO), Take by mouth., Disp: , Rfl:    Homeopathic Products ([place]), Apply topically. Spray or moose, Disp: , Rfl:    hydrALAZINE (APRESOLINE) 100 MG tablet, Take 1 tablet (100 mg total) by mouth 3 (three) times daily., Disp: 270 tablet, Rfl: 3   irbesartan-hydrochlorothiazide (AVALIDE) 300-12.5 MG tablet, Take 1 tablet by mouth daily., Disp: 90 tablet, Rfl: 3   losartan (COZAAR) 100  MG tablet, TAKE 1 TABLET BY MOUTH EVERY DAY, Disp: 90 tablet, Rfl: 1   Melatonin 5 MG CAPS, Take 1 capsule by mouth at bedtime as needed., Disp: , Rfl:    meloxicam (MOBIC) 7.5 MG tablet, TAKE 1 TABLET BY MOUTH EVERY DAY, Disp: 30 tablet, Rfl: 2   metoprolol tartrate (LOPRESSOR) 25 MG tablet, TAKE 1 TABLET BY MOUTH TWICE A DAY, Disp: 180 tablet, Rfl: 1   pantoprazole (PROTONIX) 40 MG tablet, TAKE 1 TABLET BY MOUTH EVERY DAY, Disp: 30 tablet, Rfl: 1   WEGOVY 0.5 MG/0.5ML SOAJ, Inject 0.5 mg into the skin once a week., Disp: 2 mL, Rfl: 0   acetaminophen (TYLENOL) 500 MG tablet, Take 2 tablets (1,000 mg total) by mouth at bedtime., Disp: 30 tablet, Rfl: 0   APPLE CIDER VINEGAR PO, Take by mouth. Goli gummies (Patient not taking: Reported on 12/02/2020), Disp: , Rfl:    diclofenac sodium (VOLTAREN) 1 % GEL, Apply 2 g topically 4 (four) times daily as needed (pain). (Patient not taking: Reported on 12/02/2020), Disp: , Rfl:    ELDERBERRY PO, Take by mouth. (Patient not taking: Reported on 01/30/2021), Disp: , Rfl:    magnesium 30 MG tablet, Take 30 mg by mouth 2 (two) times daily. (Patient not taking: Reported on 05/31/2021), Disp: , Rfl:    Misc Natural Products (OSTEO BI-FLEX ADV TRIPLE ST PO), Take 1 tablet by mouth 2 (two) times daily. Glucosamine and turmeric, Disp: , Rfl:    ondansetron (ZOFRAN-ODT) 4 MG disintegrating tablet, Take 1 tablet (4 mg total) by mouth every 8 (eight) hours as needed for nausea or vomiting. (Patient not taking: Reported on 01/30/2021), Disp: 20 tablet, Rfl: 5   pramipexole (MIRAPEX) 0.25 MG tablet, TAKE 1 TABLET BY MOUTH AT BEDTIME. (Patient not taking: Reported on 01/30/2021), Disp: 90 tablet, Rfl: 1   rizatriptan (MAXALT-MLT) 10 MG disintegrating tablet, Take 1 tablet earliest onset of migraine.  May repeat in 2 hours if needed.  Maximum 2 tablets in 24 hours (Patient not taking: Reported on 01/30/2021), Disp: 10 tablet, Rfl: 5   triamcinolone cream (KENALOG) 0.1 %, APPLY 1  APPLICATION TOPICALLY DAILY AS NEEDED. (Patient not taking: Reported on 05/31/2021), Disp: 45 g, Rfl: 0  Review of Systems:  Constitutional: Denies fever, chills, diaphoresis, appetite change and fatigue.  HEENT: Denies photophobia, eye pain, redness, hearing loss, ear pain, congestion, sore throat, rhinorrhea, sneezing, mouth sores, trouble swallowing, neck pain, neck stiffness and tinnitus.   Respiratory: Denies SOB, DOE, cough, chest tightness,  and wheezing.   Cardiovascular:  Denies chest pain, palpitations and leg swelling.  Gastrointestinal: Denies nausea, vomiting, abdominal pain, diarrhea, constipation, blood in stool and abdominal distention.  Genitourinary: Denies dysuria, urgency, frequency, hematuria, flank pain and difficulty urinating.  Endocrine: Denies: hot or cold intolerance, sweats, changes in hair or nails, polyuria, polydipsia. Musculoskeletal: Denies myalgias, back pain, joint swelling, arthralgias and gait problem.  Skin: Denies pallor, rash and wound.  Neurological: Denies dizziness, seizures, syncope, weakness, light-headedness, numbness and headaches.  Hematological: Denies adenopathy. Easy bruising, personal or family bleeding history  Psychiatric/Behavioral: Denies suicidal ideation, mood changes, confusion, nervousness, sleep disturbance and agitation    Physical Exam: Vitals:   05/31/21 0741 05/31/21 0807  BP: (!) 150/90 140/88  Pulse: (!) 59   Temp: 98 F (36.7 C)   TempSrc: Oral   SpO2: 98%   Weight: 297 lb (134.7 kg)   Height: '5\' 9"'$  (1.753 m)     Body mass index is 43.86 kg/m.   Constitutional: NAD, calm, comfortable, obese Eyes: PERRL, lids and conjunctivae normal ENMT: Mucous membranes are moist. Posterior pharynx clear of any exudate or lesions. Normal dentition. Tympanic membrane is pearly white, no erythema or bulging. Neck: normal, supple, no masses, no thyromegaly Respiratory: clear to auscultation bilaterally, no wheezing, no crackles.  Normal respiratory effort. No accessory muscle use.  Cardiovascular: Regular rate and rhythm, no murmurs / rubs / gallops. No extremity edema. 2+ pedal pulses. No carotid bruits.  Abdomen: no tenderness, no masses palpated. No hepatosplenomegaly. Bowel sounds positive.  Musculoskeletal: no clubbing / cyanosis. No joint deformity upper and lower extremities. Good ROM, no contractures. Normal muscle tone.  Skin: no rashes, lesions, ulcers. No induration Neurologic: CN 2-12 grossly intact. Sensation intact, DTR normal. Strength 5/5 in all 4.  Psychiatric: Normal judgment and insight. Alert and oriented x 3. Normal mood.    Impression and Plan:  Encounter for preventive health examination -Recommend routine eye and dental care. -Immunizations: Overdue for COVID-vaccine, other immunizations are up-to-date -Healthy lifestyle discussed in detail. -Labs to be updated today. -Colon cancer screening: 11/2017 -Breast cancer screening: 12/2020 -Cervical cancer screening: 02/2021 -Lung cancer screening: Not applicable -Prostate cancer screening: Not applicable -DEXA: Not applicable  Postmenopausal vaginal bleeding -Urgent referral to GYN placed.  Chronic bilateral thoracic back pain -Referral to plastic surgery to consider breast reduction.  Morbid obesity ([place])  - Plan: Hemoglobin A1c, Vitamin B12, VITAMIN D 25 Hydroxy (Vit-D Deficiency, Fractures) -Discussed healthy lifestyle, including increased physical activity and better food choices to promote weight loss.  Pure hypercholesterolemia  - Plan: Lipid panel -Currently on atorvastatin 80 mg daily.  Primary hypertension  - Plan: CBC with Differential/Platelet, Comprehensive metabolic panel -Uncontrolled.  She will make sure she is taking all medications as prescribed and follow-up with me on this.  If she is taking all may need to consider dose adjustment.  Chronic diastolic CHF (congestive heart failure) (HCC)  - Plan: TSH -Appears  compensated today.  She is supposed to be on carvedilol, Avalide, hydralazine, statin.  She will double check her medicine list.     Patient Instructions  -Nice seeing you today!!  -Lab work today; will notify you once results are available.  -Remember your COVID vaccine.  -Confirm that you are taking all medications that are on this list.  -Check BP 2-3 times a week and bring measurements to your next visit in 3 months.  -GYN and plastic surgery referrals have been placed.      Lelon Frohlich, MD [facility]

## 2021-05-31 NOTE — Progress Notes (Signed)
Mammogram & Pap results requested from Dr Charlesetta Garibaldi.

## 2021-05-31 NOTE — Patient Instructions (Signed)
-  Nice seeing you today!!  -Lab work today; will notify you once results are available.  -Remember your COVID vaccine.  -Confirm that you are taking all medications that are on this list.  -Check BP 2-3 times a week and bring measurements to your next visit in 3 months.  -GYN and plastic surgery referrals have been placed.

## 2021-06-01 ENCOUNTER — Encounter: Payer: Self-pay | Admitting: Internal Medicine

## 2021-06-01 NOTE — Progress Notes (Signed)
[place]

## 2021-06-02 ENCOUNTER — Other Ambulatory Visit: Payer: Self-pay | Admitting: Cardiology

## 2021-06-08 DIAGNOSIS — M25562 Pain in left knee: Secondary | ICD-10-CM | POA: Diagnosis not present

## 2021-06-08 DIAGNOSIS — M25561 Pain in right knee: Secondary | ICD-10-CM | POA: Diagnosis not present

## 2021-06-14 ENCOUNTER — Emergency Department (HOSPITAL_BASED_OUTPATIENT_CLINIC_OR_DEPARTMENT_OTHER)
Admission: EM | Admit: 2021-06-14 | Discharge: 2021-06-14 | Disposition: A | Discharge status: Home / Self Care | Payer: BC Managed Care – PPO | Attending: Emergency Medicine | Admitting: Emergency Medicine

## 2021-06-14 ENCOUNTER — Other Ambulatory Visit: Payer: Self-pay

## 2021-06-14 ENCOUNTER — Emergency Department (HOSPITAL_BASED_OUTPATIENT_CLINIC_OR_DEPARTMENT_OTHER): Payer: BC Managed Care – PPO | Admitting: Radiology

## 2021-06-14 ENCOUNTER — Encounter (HOSPITAL_BASED_OUTPATIENT_CLINIC_OR_DEPARTMENT_OTHER): Payer: Self-pay | Admitting: Obstetrics and Gynecology

## 2021-06-14 ENCOUNTER — Telehealth: Payer: Self-pay | Admitting: Internal Medicine

## 2021-06-14 DIAGNOSIS — I10 Essential (primary) hypertension: Secondary | ICD-10-CM

## 2021-06-14 DIAGNOSIS — Z79899 Other long term (current) drug therapy: Secondary | ICD-10-CM | POA: Diagnosis not present

## 2021-06-14 DIAGNOSIS — I509 Heart failure, unspecified: Secondary | ICD-10-CM | POA: Diagnosis not present

## 2021-06-14 DIAGNOSIS — M25562 Pain in left knee: Secondary | ICD-10-CM | POA: Diagnosis not present

## 2021-06-14 DIAGNOSIS — I110 Hypertensive heart disease with heart failure: Secondary | ICD-10-CM | POA: Insufficient documentation

## 2021-06-14 DIAGNOSIS — E119 Type 2 diabetes mellitus without complications: Secondary | ICD-10-CM | POA: Diagnosis not present

## 2021-06-14 DIAGNOSIS — R030 Elevated blood-pressure reading, without diagnosis of hypertension: Secondary | ICD-10-CM | POA: Diagnosis not present

## 2021-06-14 DIAGNOSIS — M25561 Pain in right knee: Secondary | ICD-10-CM | POA: Diagnosis not present

## 2021-06-14 DIAGNOSIS — R0602 Shortness of breath: Secondary | ICD-10-CM | POA: Diagnosis not present

## 2021-06-14 LAB — CBC
HCT: 35.9 % — ABNORMAL LOW (ref 36.0–46.0)
Hemoglobin: 11.3 g/dL — ABNORMAL LOW (ref 12.0–15.0)
MCH: 28.6 pg (ref 26.0–34.0)
MCHC: 31.5 g/dL (ref 30.0–36.0)
MCV: 90.9 fL (ref 80.0–100.0)
Platelets: 184 10*3/uL (ref 150–400)
RBC: 3.95 MIL/uL (ref 3.87–5.11)
RDW: 16.7 % — ABNORMAL HIGH (ref 11.5–15.5)
WBC: 4.2 10*3/uL (ref 4.0–10.5)
nRBC: 0.5 % — ABNORMAL HIGH (ref 0.0–0.2)

## 2021-06-14 LAB — BASIC METABOLIC PANEL
Anion gap: 7 (ref 5–15)
BUN: 9 mg/dL (ref 6–20)
CO2: 28 mmol/L (ref 22–32)
Calcium: 9.5 mg/dL (ref 8.9–10.3)
Chloride: 108 mmol/L (ref 98–111)
Creatinine, Ser: 0.84 mg/dL (ref 0.44–1.00)
GFR, Estimated: >60 mL/min (ref >60)
Glucose, Bld: 95 mg/dL (ref 70–99)
Potassium: 3.8 mmol/L (ref 3.5–5.1)
Sodium: 143 mmol/L (ref 135–145)

## 2021-06-14 LAB — TROPONIN I (HIGH SENSITIVITY)
Troponin I (High Sensitivity): 2 ng/L (ref <18)
Troponin I (High Sensitivity): 3 ng/L (ref <18)

## 2021-06-14 LAB — BRAIN NATRIURETIC PEPTIDE: B Natriuretic Peptide: 114.9 pg/mL — ABNORMAL HIGH (ref 0.0–100.0)

## 2021-06-14 NOTE — Telephone Encounter (Signed)
Pt is calling to concerning the empagliflozin (JARDIANCE) 10 MG TABS tablet  May need a PA  [id number] [address] Phone:  [phone] Fax:  [phone]

## 2021-06-14 NOTE — ED Notes (Signed)
Discharge instructions and follow up care reviewed and explained, pt verbalized understanding and had no further questions on discharge.

## 2021-06-14 NOTE — ED Triage Notes (Signed)
Patient reports to the ER for HTN. Patient reports she saw her PCP x2 weeks ago and has been having elevated BP that she has been tracking. Patient reports she is also having some feelings of headache and shortness of breath. Patient reports she has had some visual disturbances.

## 2021-06-14 NOTE — Discharge Instructions (Signed)
Follow up with your Physicain for rehceck

## 2021-06-14 NOTE — ED Provider Notes (Signed)
[facility] EMERGENCY DEPT Provider Note   CSN: [id number] Arrival date & time: 06/14/21  1148     History  Chief Complaint  Patient presents with   Hypertension   Headache   Shortness of Breath    Jamie [name] is a 61 y.o. female.  And is concerned about elevated blood pressure she reports she saw her primary care physician who has had her record her blood pressure.  Patient reports her blood pressure was elevated today.  Patient has a past medical history of congestive heart failure, hypertension and diabetes.  Patient reports she is experienced some mild shortness of breath exertion.  Patient denies any fever or chills.  Patient denies any cough or congestion.  The history is provided by the patient. No language interpreter was used.  Hypertension This is a new problem. The current episode started yesterday. The problem occurs constantly. The problem has not changed since onset.Associated symptoms include shortness of breath. Pertinent negatives include no chest pain, no abdominal pain and no headaches. Nothing aggravates the symptoms. Nothing relieves the symptoms. She has tried nothing for the symptoms. The treatment provided no relief.  Headache Associated symptoms: no abdominal pain   Shortness of Breath Severity:  Mild Onset quality:  Gradual Timing:  Constant Chronicity:  New Relieved by:  Nothing Worsened by:  Nothing Ineffective treatments:  Lying down Associated symptoms: no abdominal pain, no chest pain and no headaches        Home Medications Prior to Admission medications   Medication Sig Start Date End Date Taking? Authorizing Provider  acetaminophen (TYLENOL) 500 MG tablet Take 2 tablets (1,000 mg total) by mouth at bedtime. 10/07/17   Doreatha Lew, MD  APPLE CIDER VINEGAR PO Take by mouth. Goli gummies Patient not taking: Reported on 12/02/2020    [provider]  atorvastatin (LIPITOR) 80 MG tablet Take 1 tablet (80 mg  total) by mouth daily. 05/09/20   Sueanne Margarita, MD  carvedilol (COREG) 12.5 MG tablet TAKE 1/2 TABLET BY MOUTH IN THE MORNING AND 1 TABLET BY MOUTH IN THE EVENING. 06/02/21   Sueanne Margarita, MD  Cholecalciferol (VITAMIN D3) 50 MCG (2000 UT) capsule Take 2,000 Units by mouth daily.    [provider]  diclofenac sodium (VOLTAREN) 1 % GEL Apply 2 g topically 4 (four) times daily as needed (pain). Patient not taking: Reported on 12/02/2020    [provider]  ELDERBERRY PO Take by mouth. Patient not taking: Reported on 01/30/2021    [provider]  empagliflozin (JARDIANCE) 10 MG TABS tablet Take 1 tablet (10 mg total) by mouth daily before breakfast. 05/31/21   Isaac Bliss, Rayford Halsted, MD  furosemide (LASIX) 20 MG tablet Take 1 tablet (20 mg total) by mouth daily. You may take 1 extra tablet (20 mg) as needed for increased swelling 05/09/20   Sueanne Margarita, MD  gabapentin (NEURONTIN) 300 MG capsule Take 300-600 mg by mouth as needed.    [provider]  Homeopathic Products (LEG CRAMPS PO) Take by mouth.    [provider]  Homeopathic Products ([place]) Apply topically. Spray or moose    [provider]  hydrALAZINE (APRESOLINE) 100 MG tablet Take 1 tablet (100 mg total) by mouth 3 (three) times daily. 05/09/20   Sueanne Margarita, MD  irbesartan-hydrochlorothiazide (AVALIDE) 300-12.5 MG tablet Take 1 tablet by mouth daily. 05/09/20   Sueanne Margarita, MD  losartan (COZAAR) 100 MG tablet TAKE 1  TABLET BY MOUTH EVERY DAY 07/18/20   Isaac Bliss, Rayford Halsted, MD  magnesium 30 MG tablet Take 30 mg by mouth 2 (two) times daily. Patient not taking: Reported on 05/31/2021    [provider]  Melatonin 5 MG CAPS Take 1 capsule by mouth at bedtime as needed.    [provider]  meloxicam (MOBIC) 7.5 MG tablet TAKE 1 TABLET BY MOUTH EVERY DAY 03/23/21   Isaac Bliss, Rayford Halsted, MD  Misc Natural Products (OSTEO BI-FLEX ADV TRIPLE  ST PO) Take 1 tablet by mouth 2 (two) times daily. Glucosamine and turmeric    [provider]  ondansetron (ZOFRAN-ODT) 4 MG disintegrating tablet Take 1 tablet (4 mg total) by mouth every 8 (eight) hours as needed for nausea or vomiting. Patient not taking: Reported on 01/30/2021 12/02/20   Pieter Partridge, DO  pantoprazole (PROTONIX) 40 MG tablet TAKE 1 TABLET BY MOUTH EVERY DAY 05/22/21   Sueanne Margarita, MD  pramipexole (MIRAPEX) 0.25 MG tablet TAKE 1 TABLET BY MOUTH AT BEDTIME. Patient not taking: Reported on 01/30/2021 06/21/20   Eulas Post, MD  rizatriptan (MAXALT-MLT) 10 MG disintegrating tablet Take 1 tablet earliest onset of migraine.  May repeat in 2 hours if needed.  Maximum 2 tablets in 24 hours Patient not taking: Reported on 01/30/2021 12/02/20   Pieter Partridge, DO  triamcinolone cream (KENALOG) 0.1 % APPLY 1 APPLICATION TOPICALLY DAILY AS NEEDED. Patient not taking: Reported on 05/31/2021 01/06/18   [place], Betty G, MD  WEGOVY 0.5 MG/0.5ML SOAJ Inject 0.5 mg into the skin once a week. 03/03/21   Sueanne Margarita, MD      Allergies    Patient has no known allergies.    Review of Systems   Review of Systems  Respiratory:  Positive for shortness of breath.   Cardiovascular:  Negative for chest pain.  Gastrointestinal:  Negative for abdominal pain.  Neurological:  Negative for headaches.  All other systems reviewed and are negative.   Physical Exam Updated Vital Signs BP (!) 157/85   Pulse 62   Temp 98.8 F (37.1 C)   Resp 16   Ht '5\' 9"'$  (1.753 m)   Wt 127 kg   LMP 05/16/2021 (Approximate) Comment: pt states it wasn't heavy but painful  SpO2 99%   BMI 41.35 kg/m  Physical Exam Vitals reviewed.  Constitutional:      Appearance: She is well-developed.  HENT:     Head: Normocephalic.  Eyes:     Extraocular Movements: Extraocular movements intact.  Cardiovascular:     Rate and Rhythm: Normal rate.  Pulmonary:     Effort: Pulmonary effort is normal.   Abdominal:     Palpations: Abdomen is soft.  Musculoskeletal:        General: Normal range of motion.     Cervical back: Normal range of motion.  Skin:    General: Skin is warm.  Neurological:     Mental Status: She is alert.  Psychiatric:        Mood and Affect: Mood normal.     ED Results / Procedures / Treatments   Labs (all labs ordered are listed, but only abnormal results are displayed) Labs Reviewed  CBC - Abnormal; Notable for the following components:      Result Value   Hemoglobin 11.3 (*)    HCT 35.9 (*)    RDW 16.7 (*)    nRBC 0.5 (*)    All other components within  normal limits  BRAIN NATRIURETIC PEPTIDE - Abnormal; Notable for the following components:   B Natriuretic Peptide 114.9 (*)    All other components within normal limits  BASIC METABOLIC PANEL  TROPONIN I (HIGH SENSITIVITY)  TROPONIN I (HIGH SENSITIVITY)    EKG EKG Interpretation  Date/Time:  Wednesday June 14 2021 12:47:41 EDT Ventricular Rate:  59 PR Interval:  148 QRS Duration: 76 QT Interval:  406 QTC Calculation: 401 R Axis:   23 Text Interpretation: Sinus bradycardia Otherwise normal ECG When compared with ECG of 27-Jan-2020 15:35, PREVIOUS ECG IS PRESENT Confirmed by Octaviano Glow [phone]) on 06/14/2021 5:18:52 PM  Radiology DG Chest 2 View  Result Date: 06/14/2021 CLINICAL DATA:  Short of breath. EXAM: CHEST - 2 VIEW COMPARISON:  None Available. FINDINGS: Normal mediastinum and cardiac silhouette. Normal pulmonary vasculature. No evidence of effusion, infiltrate, or pneumothorax. No acute bony abnormality. Degenerative osteophytosis of the spine. IMPRESSION: No acute cardiopulmonary process. Electronically Signed   By: Suzy Bouchard M.D.   On: 06/14/2021 17:25    Procedures Procedures    Medications Ordered in ED Medications - No data to display  ED Course/ Medical Decision Making/ A&P                           Medical Decision Making Patient complains of elevated blood  pressure  Amount and/or Complexity of Data Reviewed External Data Reviewed: notes.    Details: Primary care notes reviewed Labs: ordered. Decision-making details documented in ED Course.    Details: Labs are ordered reviewed and interpreted.  Patient has a globin of 11.3 she is slightly elevated BNP of 114 troponin is negative x2 Radiology: ordered and independent interpretation performed. Decision-making details documented in ED Course.    Details: Dust x-ray is ordered reviewed and interpreted chest x-ray shows no acute cardiopulmonary disease ECG/medicine tests: ordered and independent interpretation performed.    Details: EG shows no acute abnormality  Risk Risk Details: Patient's laboratory evaluations are reassuring.  Patient is counseled on diet for hypertension as well as increasing exercise and weight loss.  I advised patient to schedule to see cardiology for evaluation            Final Clinical Impression(s) / ED Diagnoses Final diagnoses:  Hypertension, unspecified type    Rx / DC Orders ED Discharge Orders     None      An After Visit Summary was printed and given to the patient.    Fransico Meadow, [place] 06/14/21 1906    Wyvonnia Dusky, MD 06/14/21 [phone]

## 2021-06-19 ENCOUNTER — Telehealth: Payer: Self-pay | Admitting: Internal Medicine

## 2021-06-19 DIAGNOSIS — M7052 Other bursitis of knee, left knee: Secondary | ICD-10-CM | POA: Diagnosis not present

## 2021-06-19 DIAGNOSIS — M170 Bilateral primary osteoarthritis of knee: Secondary | ICD-10-CM | POA: Diagnosis not present

## 2021-06-19 DIAGNOSIS — M7051 Other bursitis of knee, right knee: Secondary | ICD-10-CM | POA: Diagnosis not present

## 2021-06-19 DIAGNOSIS — E1169 Type 2 diabetes mellitus with other specified complication: Secondary | ICD-10-CM

## 2021-06-19 DIAGNOSIS — G4440 Drug-induced headache, not elsewhere classified, not intractable: Secondary | ICD-10-CM

## 2021-06-19 MED ORDER — EMPAGLIFLOZIN 10 MG PO TABS: 10.0000 mg | ORAL_TABLET | Freq: Every day | ORAL | 0 refills | Status: DC

## 2021-06-19 MED ORDER — DICLOFENAC SODIUM 1 % EX GEL: 2.0000 g | Freq: Two times a day (BID) | CUTANEOUS | 2 refills | Status: DC | PRN

## 2021-06-19 MED ORDER — MELOXICAM 7.5 MG PO TABS: 7.5000 mg | ORAL_TABLET | Freq: Every day | ORAL | 0 refills | Status: DC

## 2021-06-19 NOTE — Telephone Encounter (Signed)
Prior Jamie [name] has been started for   Jardiance 10 mg Jamie [name] (Key: [id number])  Your information has been sent to [facility].

## 2021-06-19 NOTE — Telephone Encounter (Signed)
Spoke with patient and a prior Josem Kaufmann will be started for Jardiance 10 mg.  Samples given.  Patient also requests refills for Meloxicam and Voltaren.  Refills sent

## 2021-06-19 NOTE — Telephone Encounter (Signed)
Pt went to [facility] Emergency room last week due to her BP being high.  The dr. There told her she may need to be referred out to another dr. To get all of her medications adjusted, but pt isn't sure what kind of dr she was talking about.  Pt took BP yesterday at home--it was 154/82.  I have placed an updated list of medications in the dr's folder up front. Please advise pt.

## 2021-06-20 ENCOUNTER — Telehealth: Payer: Self-pay | Admitting: *Deleted

## 2021-06-20 ENCOUNTER — Other Ambulatory Visit: Payer: Self-pay | Admitting: Cardiology

## 2021-06-20 DIAGNOSIS — M25561 Pain in right knee: Secondary | ICD-10-CM | POA: Diagnosis not present

## 2021-06-20 DIAGNOSIS — M48061 Spinal stenosis, lumbar region without neurogenic claudication: Secondary | ICD-10-CM | POA: Diagnosis not present

## 2021-06-20 DIAGNOSIS — M25562 Pain in left knee: Secondary | ICD-10-CM | POA: Diagnosis not present

## 2021-06-20 NOTE — Telephone Encounter (Signed)
Medication list updated.

## 2021-06-20 NOTE — Telephone Encounter (Signed)
Prior auth for empagliflozin (JARDIANCE) 10 MG TABS tablet Was denied.  Please advise

## 2021-06-21 ENCOUNTER — Encounter (HOSPITAL_BASED_OUTPATIENT_CLINIC_OR_DEPARTMENT_OTHER): Payer: Self-pay

## 2021-06-26 NOTE — Telephone Encounter (Signed)
Spoke with patient.  She is currently in [place].  She will think about switching to Ozempic and call back next week.

## 2021-07-05 DIAGNOSIS — M25561 Pain in right knee: Secondary | ICD-10-CM | POA: Diagnosis not present

## 2021-07-05 DIAGNOSIS — M25562 Pain in left knee: Secondary | ICD-10-CM | POA: Diagnosis not present

## 2021-07-10 ENCOUNTER — Other Ambulatory Visit: Payer: Self-pay | Admitting: Cardiology

## 2021-07-10 NOTE — Telephone Encounter (Signed)
Pt's pharmacy is requesting a refill on pantoprazole. Would Dr. Turner like to refill this medication? Please address 

## 2021-07-14 ENCOUNTER — Encounter: Payer: Self-pay | Admitting: Plastic Surgery

## 2021-07-14 ENCOUNTER — Ambulatory Visit (INDEPENDENT_AMBULATORY_CARE_PROVIDER_SITE_OTHER): Payer: BC Managed Care – PPO | Admitting: Plastic Surgery

## 2021-07-14 VITALS — BP 185/107 | HR 64 | Ht 70.5 in | Wt 296.4 lb

## 2021-07-14 DIAGNOSIS — M545 Low back pain, unspecified: Secondary | ICD-10-CM | POA: Diagnosis not present

## 2021-07-14 DIAGNOSIS — N62 Hypertrophy of breast: Secondary | ICD-10-CM | POA: Insufficient documentation

## 2021-07-14 DIAGNOSIS — I509 Heart failure, unspecified: Secondary | ICD-10-CM

## 2021-07-14 DIAGNOSIS — I272 Pulmonary hypertension, unspecified: Secondary | ICD-10-CM

## 2021-07-14 DIAGNOSIS — R0602 Shortness of breath: Secondary | ICD-10-CM

## 2021-07-14 DIAGNOSIS — M542 Cervicalgia: Secondary | ICD-10-CM | POA: Insufficient documentation

## 2021-07-14 DIAGNOSIS — M549 Dorsalgia, unspecified: Secondary | ICD-10-CM

## 2021-07-14 DIAGNOSIS — I5032 Chronic diastolic (congestive) heart failure: Secondary | ICD-10-CM

## 2021-07-14 DIAGNOSIS — I10 Essential (primary) hypertension: Secondary | ICD-10-CM

## 2021-07-14 DIAGNOSIS — Z6841 Body Mass Index (BMI) 40.0 and over, adult: Secondary | ICD-10-CM

## 2021-07-14 NOTE — Progress Notes (Signed)
Patient ID: Jamie [name], female    DOB: Aug 11, 1960, 61 y.o.   MRN: [id number]   Chief Complaint  Patient presents with   Consult   Breast Problem    Mammary Hyperplasia: The patient is a 61 y.o. female with a history of mammary hyperplasia for several years.  She has extremely large breasts causing symptoms that include the following: Back pain in the upper and lower back, including neck pain. She pulls or pins her bra straps to provide better lift and relief of the pressure and pain. She notices relief by holding her breast up manually.  Her shoulder straps cause grooves and pain and pressure that requires padding for relief. Pain medication is sometimes required with motrin and tylenol.  Activities that are hindered by enlarged breasts include: exercise and running.  She has tried supportive clothing as well as fitted bras without improvement.  Her breasts are extremely large with the right breast larger than the left.  She has hyperpigmentation of the inframammary area on both sides.  The sternal to nipple distance on the right is 43 cm and the left is 43 cm.  The IMF distance is 27 cm.  She is 5 feet 10 inches tall and weighs 296 pounds.  The BMI = 42.5 kg/m.  Preoperative bra size = G cup.  The estimated excess breast tissue to be removed at the time of surgery = 1200 grams on the left and 1200 grams on the right.  Mammogram history: 12/22 negative.  Family history of breast cancer:  none.  Tobacco use:  quit years ago.   The patient expresses the desire to pursue surgical intervention. Has had back surgery and needs to have knee replacements.    Review of Systems  Constitutional: Negative.   HENT: Negative.    Eyes: Negative.   Respiratory: Negative.    Cardiovascular: Negative.   Gastrointestinal: Negative.   Endocrine: Negative.   Genitourinary: Negative.   Musculoskeletal:  Positive for back pain and neck pain.  Skin:  Positive for rash.  Hematological: Negative.    Psychiatric/Behavioral: Negative.      Past Medical History:  Diagnosis Date   Acquired dilation of ascending aorta and aortic root ([place])    60m by 2D echo 03/2019   Allergy    Aortic valve disease    functionally bicuspid AV with fusion of the right and left coronary cusps with no AS by echo 03/2020   Arthritis    in back   Chest pain    neg cath 2016 after false positive myoview   Depression    GERD (gastroesophageal reflux disease)    Hyperglycemia    Hyperlipidemia    Hypertension    Joint pain    Knee pain    Low back pain    Migraine    Mitral valve prolapse    Muscle cramps    Prediabetes    Pulmonary HTN ([place]    mild with PASP 325mg by echo 03/2019 but 2619m on echo 03/2020    Past Surgical History:  Procedure Laterality Date   BACK SURGERY     CESAREAN SECTION     LEFT HEART CATHETERIZATION WITH CORONARY ANGIOGRAM N/A 07/08/2013   Procedure: LEFT HEART CATHETERIZATION WITH CORONARY ANGIOGRAM;  Surgeon: PetJosue HectorD;  [facility];  Service: Cardiovascular;  Laterality: N/A;   TUBAL LIGATION        Current Outpatient Medications:    acetaminophen (TYLENOL) 500 MG  tablet, Take 2 tablets (1,000 mg total) by mouth at bedtime., Disp: 30 tablet, Rfl: 0   APPLE CIDER VINEGAR PO, Take by mouth. [place] gummies, Disp: , Rfl:    atorvastatin (LIPITOR) 80 MG tablet, Take 1 tablet (80 mg total) by mouth daily., Disp: 90 tablet, Rfl: 3   carvedilol (COREG) 12.5 MG tablet, TAKE 1/2 TABLET BY MOUTH IN THE MORNING AND 1 TABLET BY MOUTH IN THE EVENING. (Patient taking differently: Take 1 tablet by mouth in the morning and 1 tablet by mouth in the evening.), Disp: 22 tablet, Rfl: 0   Cholecalciferol (VITAMIN D3) 50 MCG (2000 UT) capsule, Take 2,000 Units by mouth daily., Disp: , Rfl:    diclofenac sodium (VOLTAREN) 1 % GEL, Apply 2 g topically 4 (four) times daily as needed (pain)., Disp: , Rfl:    diclofenac Sodium (VOLTAREN) 1 % GEL, Apply 2 g topically 2 (two)  times daily as needed., Disp: 2 g, Rfl: 2   ELDERBERRY PO, Take by mouth., Disp: , Rfl:    empagliflozin (JARDIANCE) 10 MG TABS tablet, Take 1 tablet (10 mg total) by mouth daily before breakfast., Disp: 28 tablet, Rfl: 0   furosemide (LASIX) 20 MG tablet, Take 1 tablet (20 mg total) by mouth daily. You may take 1 extra tablet (20 mg) as needed for increased swelling, Disp: 90 tablet, Rfl: 3   gabapentin (NEURONTIN) 300 MG capsule, Take 300-600 mg by mouth as needed., Disp: , Rfl:    Homeopathic Products (LEG CRAMPS PO), Take by mouth., Disp: , Rfl:    Homeopathic Products ([place]), Apply topically. Spray or moose, Disp: , Rfl:    hydrALAZINE (APRESOLINE) 100 MG tablet, Take 1 tablet (100 mg total) by mouth 3 (three) times daily. (Patient taking differently: Take 100 mg by mouth daily.), Disp: 270 tablet, Rfl: 3   irbesartan-hydrochlorothiazide (AVALIDE) 300-12.5 MG tablet, Take 1 tablet by mouth daily., Disp: 90 tablet, Rfl: 3   losartan (COZAAR) 100 MG tablet, TAKE 1 TABLET BY MOUTH EVERY DAY, Disp: 90 tablet, Rfl: 1   magnesium 30 MG tablet, Take 30 mg by mouth 2 (two) times daily., Disp: , Rfl:    Melatonin 5 MG CAPS, Take 1 capsule by mouth at bedtime as needed., Disp: , Rfl:    meloxicam (MOBIC) 7.5 MG tablet, Take 1 tablet (7.5 mg total) by mouth daily., Disp: 90 tablet, Rfl: 0   Misc Natural Products (OSTEO BI-FLEX ADV TRIPLE ST PO), Take 1 tablet by mouth 2 (two) times daily. Glucosamine and turmeric, Disp: , Rfl:    pantoprazole (PROTONIX) 40 MG tablet, TAKE 1 TABLET BY MOUTH EVERY DAY, Disp: 15 tablet, Rfl: 0   rizatriptan (MAXALT-MLT) 10 MG disintegrating tablet, Take 1 tablet earliest onset of migraine.  May repeat in 2 hours if needed.  Maximum 2 tablets in 24 hours, Disp: 10 tablet, Rfl: 5   triamcinolone cream (KENALOG) 0.1 %, APPLY 1 APPLICATION TOPICALLY DAILY AS NEEDED., Disp: 45 g, Rfl: 0   WEGOVY 0.5 MG/0.5ML SOAJ, Inject 0.5 mg into the skin once a week., Disp: 2  mL, Rfl: 0   Objective:   Vitals:   07/14/21 0840  BP: (!) 185/107  Pulse: 64  SpO2: 97%    Physical Exam Vitals and nursing note reviewed.  Constitutional:      Appearance: Normal appearance.  HENT:     Head: Normocephalic and atraumatic.  Cardiovascular:     Rate and Rhythm: Normal rate.     Pulses: Normal  pulses.  Pulmonary:     Effort: Pulmonary effort is normal.  Abdominal:     General: There is no distension.     Palpations: Abdomen is soft.     Tenderness: There is no abdominal tenderness.  Musculoskeletal:        General: No swelling or deformity.  Skin:    General: Skin is warm.     Coloration: Skin is not jaundiced.     Findings: Rash present. No bruising.  Neurological:     Mental Status: She is alert and oriented to person, place, and time.  Psychiatric:        Mood and Affect: Mood normal.        Behavior: Behavior normal.        Thought Content: Thought content normal.     Assessment & Plan:  Primary hypertension  Chronic diastolic CHF (congestive heart failure) (HCC)  Congestive heart failure, unspecified HF chronicity, unspecified heart failure type ([place])  Pulmonary HTN (HCC)  Acute bilateral low back pain, unspecified whether sciatica present  Chronic bilateral low back pain without sciatica  Morbid obesity (HCC)  Shortness of breath on exertion  Symptomatic mammary hypertrophy  Neck pain  The procedure the patient selected and that was best for the patient was discussed. The risk were discussed and include but not limited to the following:  Breast asymmetry, fluid accumulation, firmness of the breast, inability to breast feed, loss of nipple or areola, skin loss, change in skin and nipple sensation, fat necrosis of the breast tissue, bleeding, infection and healing delay.  There are risks of anesthesia and injury to nerves or blood vessels.  Allergic reaction to tape, suture and skin glue are possible.  There will be swelling.  Any of  these can lead to the need for revisional surgery.  A breast reduction has potential to interfere with diagnostic procedures in the future.  This procedure is best done when the breast is fully developed.  Changes in the breast will continue to occur over time: pregnancy, weight gain or weigh loss.    Total time: 40 minutes. This includes time spent with the patient during the visit as well as time spent before and after the visit reviewing the chart, documenting the encounter, ordering pertinent studies and literature for the patient.   Physical therapy:  starts in August for back pain Mammogram:  done 12/22 [facility]:  did it in the past  Is a candidate for bilateral breast reduction but I have encouraged her to go through the PT and follow up with [place] in 3 months.  Pictures were obtained of the patient and placed in the chart with the patient's or guardian's permission.   [place], DO

## 2021-07-27 ENCOUNTER — Other Ambulatory Visit: Payer: Self-pay | Admitting: Cardiology

## 2021-07-29 LAB — HM DIABETES EYE EXAM

## 2021-08-01 ENCOUNTER — Encounter (HOSPITAL_BASED_OUTPATIENT_CLINIC_OR_DEPARTMENT_OTHER): Payer: Self-pay | Admitting: Physical Therapy

## 2021-08-01 ENCOUNTER — Ambulatory Visit (HOSPITAL_BASED_OUTPATIENT_CLINIC_OR_DEPARTMENT_OTHER): Payer: BC Managed Care – PPO | Attending: Orthopedic Surgery | Admitting: Physical Therapy

## 2021-08-01 DIAGNOSIS — M48061 Spinal stenosis, lumbar region without neurogenic claudication: Secondary | ICD-10-CM | POA: Diagnosis not present

## 2021-08-01 DIAGNOSIS — M25561 Pain in right knee: Secondary | ICD-10-CM | POA: Diagnosis not present

## 2021-08-01 DIAGNOSIS — G8929 Other chronic pain: Secondary | ICD-10-CM

## 2021-08-01 DIAGNOSIS — M25562 Pain in left knee: Secondary | ICD-10-CM | POA: Diagnosis not present

## 2021-08-01 DIAGNOSIS — M6281 Muscle weakness (generalized): Secondary | ICD-10-CM | POA: Diagnosis not present

## 2021-08-01 NOTE — Therapy (Signed)
OUTPATIENT PHYSICAL THERAPY THORACOLUMBAR EVALUATION   Patient Name: Jamie [name] MRN: [id number] DOB:03/12/1960, 61 y.o., female Today's Date: 08/01/2021   PT End of Session - 08/01/21 1818     Visit Number 1    Number of Visits 12    Date for PT Re-Evaluation 09/12/21    Authorization Type BCBS    PT Start Time 0900    PT Stop Time 0935    PT Time Calculation (min) 35 min    Activity Tolerance Patient tolerated treatment well;Patient limited by pain    Behavior During Therapy [facility] for tasks assessed/performed             Past Medical History:  Diagnosis Date   Acquired dilation of ascending aorta and aortic root ([place])    51m by 2D echo 03/2019   Allergy    Aortic valve disease    functionally bicuspid AV with fusion of the right and left coronary cusps with no AS by echo 03/2020   Arthritis    in back   Chest pain    neg cath 2016 after false positive myoview   Depression    GERD (gastroesophageal reflux disease)    Hyperglycemia    Hyperlipidemia    Hypertension    Joint pain    Knee pain    Low back pain    Migraine    Mitral valve prolapse    Muscle cramps    Prediabetes    Pulmonary HTN ([place]    mild with PASP 360mg by echo 03/2019 but 2661m on echo 03/2020   Past Surgical History:  Procedure Laterality Date   BACK SURGERY     CESAREAN SECTION     LEFT HEART CATHETERIZATION WITH CORONARY ANGIOGRAM N/A 07/08/2013   Procedure: LEFT HEART CATHETERIZATION WITH CORONARY ANGIOGRAM;  Surgeon: PetJosue HectorD;  [facility];  Service: Cardiovascular;  Laterality: N/A;   TUBAL LIGATION     Patient Active Problem List   Diagnosis Date Noted   Symptomatic mammary hypertrophy 07/14/2021   Neck pain 07/14/2021   Aortic valve disease    Abnormal perimenopausal bleeding 12/30/2019   Arthritis 12/30/2019   Menorrhagia 12/30/2019   Mitral valve prolapse 12/30/2019   Uterine leiomyoma 12/30/2019   Vulvitis 12/30/2019   Restless legs 12/30/2019    Congestive heart failure ([place]4/13/2021   Cyst of right breast 04/14/2019   Hyperglycemia 04/14/2019   Acquired dilation of ascending aorta and aortic root (HCC)    Pulmonary HTN (HCC)    Chronic diastolic CHF (congestive heart failure) ([place]8/27/2020   DM (diabetes mellitus), type 2 ([place]CC[place]8/13/2020   Genital herpes 04/16/2018   Chronic back pain 04/16/2018   Syncope 10/06/2017   Other fatigue 10/01/2017   Shortness of breath on exertion 10/01/2017   Acute low back pain 02/04/2017   Degeneration of lumbar intervertebral disc 02/04/2017   Degenerative lumbar spinal stenosis 02/04/2017   Herpes simplex type 2 infection 06/20/2016   Hyperlipemia 05/06/2015   GERD (gastroesophageal reflux disease) 08/01/2010   Hypertension 01/20/2007   Morbid obesity ([place]CC[place]9/04/2006   Migraine headache 09/05/2006    PCP: HerIsaac BlissstRayford HalstedD  REFERRING PROVIDER: DahMelina Schools  REFERRING DIAG: [id number]) - Spinal stenosis, lumbar region without neurogenic claudication  Rationale for Evaluation and Treatment Rehabilitation  THERAPY DIAG:  Spinal stenosis of lumbar region without neurogenic claudication  Muscle weakness (generalized)  Chronic pain of left knee  Chronic pain of right knee  ONSET  DATE: 6 months  SUBJECTIVE:                                                                                                                                                                                           SUBJECTIVE STATEMENT: Has been working out for past 1.5 months at [employer], fell 6 months ago stopped exercising at that point x 7 months set her back. Bilaterl knee OA pain 9.5/10 left >R, past LB surgical hx x 10 years ago, no limit to walking or cleaning. Pt reports she is "used" to the pain.  Does what she needs to do, not limited. Wants to lose weight, have breast reduction and decrease back pain.  She sleeping in recliner.  Unable to tolerate lying flat/sleeping  in bed. PERTINENT HISTORY:  Thoracic pain, related to Macromastia  Elevated BMI 10 years status post a right L5-S1 discectomy End-stage bilateral knee osteoarthritis, tricompartmental, varus deformity.  Bilateral pes anserine bursitis.  Chronic low back pain.   PAIN:  Are you having pain? Yes: NPRS scale: worst: 8/10; least 0/10 current 8/10 Pain location: left sided LB buttock Pain description: nagging, dull, sometimes sharpe Aggravating factors: sitting to standing, cold weather Relieving factors: sitting, meds   PRECAUTIONS: Back  WEIGHT BEARING RESTRICTIONS No  FALLS:  Has patient fallen in last 6 months? Yes. 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   OBJECTIVE:   DIAGNOSTIC FINDINGS:  None in chart.  See above  PATIENT SURVEYS:  FOTO Risk adjusted 40 Primary measure: 58 Goal 59 SCREENING FOR RED FLAGS: None  COGNITION:  Overall cognitive status: Within functional limits for tasks assessed     SENSATION: Peripheral neuropathy, right foot tingling, numb x>5. (As per pt report)   POSTURE: rounded shoulders and increased lumbar lordosis  PALPATION: No tenderness to palpation about LB or hip/buttock  LUMBAR ROM:   Active  A/PROM  eval  Flexion 50% limited by pain  Extension 25%limited by pain  Right lateral flexion wfl  Left lateral flexion wfl  Right rotation wfl  Left rotation wfl   (Blank rows = not tested)  LOWER EXTREMITY ROM:     Active  Right eval Left eval  Hip flexion    Hip extension    Hip abduction    Hip adduction    Hip internal rotation    Hip external rotation    Knee flexion 110  105  Knee extension wfl Wfl   Ankle dorsiflexion    Ankle plantarflexion    Ankle  inversion    Ankle eversion     (Blank rows = not tested)  LOWER EXTREMITY MMT:    MMT Right eval Left eval  Hip flexion 35.0 27.0  Hip extension    Hip abduction 26.4 39.8  Hip adduction    Hip internal rotation    Hip external rotation    Knee flexion 40.3 28.7  Knee extension    Ankle dorsiflexion    Ankle plantarflexion    Ankle inversion    Ankle eversion     (Blank rows = not tested)  LUMBAR SPECIAL TESTS:  Slump test: Negative and Trendelenburg sign: Negative  FUNCTIONAL TESTS:  5 times sit to stand: 22 limited by OA knees> LBP Timed up and go (TUG): 15  GAIT: Distance walked: 500 ft Assistive device utilized: None Level of assistance: Complete Independence Comments: Slight antalgic limp lle, decreased cadence.    TODAY'S TREATMENT  Objective testing Evaluation EDU   PATIENT EDUCATION:  Education details: Statistician of condition/ properties of water/ benefit of aquatic therapy Person educated: Patient Education method: Explanation Education comprehension: verbalized understanding   HOME EXERCISE PROGRAM: Aquatic HEP to be assigned going forward  ASSESSMENT:  CLINICAL IMPRESSION: Patient is a 61 y.o. F who was seen today for physical therapy evaluation and treatment for Spinal stenosis, lumbar region without neurogenic claudication. She has an extensive med hx including L5-S1 discectomy x 10 yrs ago, chronic pain, bilat knee OA. She is trying to stay off another LB surgical procedure.  She is in process of having a breast reduction to decrease some thoracic pain she has been having.  She does work spending ~ 4 hours daily on feet.  She does reports she is not limited due to back pain, from tolerating standing or walking.  Knees are more limiting. She scored high on Foto, stating that her pain does not slow her down.  Le strength is limited to pain.  LB ROM fairly good limited only by pain with forward flex. Standing balance static and dynamic  wfl. She will benefit form skilled phys therapy for 6 weeks instructing her through an exercise program she can complete indep ([facility] at [facility]) with focus on stretching and strengthening of core, LE/knees and aerobic capacity to improve overall well being.   OBJECTIVE IMPAIRMENTS decreased activity tolerance, decreased ROM, decreased strength, increased edema, impaired sensation, obesity, and pain.   ACTIVITY LIMITATIONS lifting, bending, squatting, sleeping, stairs, transfers, and bed mobility  PARTICIPATION LIMITATIONS: cleaning, community activity, and sit to stand transfers from chairs, car, bed  [place], Past/current experiences, and 1-2 comorbidities: morbid obesity, OA knees, macromastia  are also affecting patient's functional outcome.   REHAB POTENTIAL: Good  CLINICAL DECISION MAKING: Evolving/moderate complexity  EVALUATION COMPLEXITY: Moderate   GOALS: Goals reviewed with patient? Yes  SHORT TERM GOALS: Target date: 08/22/2021  Tug under 12 sec to demonstrate improved strength and balance Baseline:15s Goal status: INITIAL  2.  Improved lumbar flex and ext ROM by 25% without increase in pain Baseline: limited by pain  Goal status: INITIAL  3.  Hip flex strength to be improved by 5 lbs Baseline: 35 right; 27 left Goal status: INITIAL  4.  LPB max pain to be decreased by at least 2 points Baseline: 8/10 Goal status: INITIAL    LONG TERM GOALS: Target date: 09/12/2021  Pt will tolerating sleeping  in bed part of night to improve rest Baseline: unable to tolerate Goal status: INITIAL  2.  Pt to be indep with final aquatic HEP Baseline: none Goal status: INITIAL  3.  Pt to meet stated FOTO goal Baseline: risk adjusted 40 Goal status: INITIAL  4. Pt will improve bilat knee strength and ROM decreasing knee pain to below 7/10 to better manage OA symptoms  Baseline: pain 9.5/10  Goal Status: INITIAL    PLAN: PT FREQUENCY:  1-2x/week  PT DURATION: 6 weeks  PLANNED INTERVENTIONS: Therapeutic exercises, Therapeutic activity, Neuromuscular re-education, Balance training, Gait training, Patient/Family education, Self Care, Joint mobilization, Joint manipulation, Stair training, Aquatic Therapy, Electrical stimulation, Spinal manipulation, Spinal mobilization, Cryotherapy, Moist heat, Taping, Traction, Ultrasound, Ionotophoresis '4mg'$ /ml Dexamethasone, and Manual therapy.  PLAN FOR NEXT SESSION: core stretching/strengthening.  Knee strengthening. Aerobic capacity training. Instruction on aquatic and [place], [place] MPT 08/01/2021, 6:20 PM

## 2021-08-02 ENCOUNTER — Encounter (HOSPITAL_BASED_OUTPATIENT_CLINIC_OR_DEPARTMENT_OTHER): Payer: Self-pay | Admitting: Physical Therapy

## 2021-08-02 ENCOUNTER — Ambulatory Visit (HOSPITAL_BASED_OUTPATIENT_CLINIC_OR_DEPARTMENT_OTHER): Payer: BC Managed Care – PPO | Admitting: Physical Therapy

## 2021-08-02 DIAGNOSIS — G8929 Other chronic pain: Secondary | ICD-10-CM

## 2021-08-02 DIAGNOSIS — M48061 Spinal stenosis, lumbar region without neurogenic claudication: Secondary | ICD-10-CM | POA: Diagnosis not present

## 2021-08-02 DIAGNOSIS — M25562 Pain in left knee: Secondary | ICD-10-CM | POA: Diagnosis not present

## 2021-08-02 DIAGNOSIS — M6281 Muscle weakness (generalized): Secondary | ICD-10-CM | POA: Diagnosis not present

## 2021-08-02 DIAGNOSIS — M25561 Pain in right knee: Secondary | ICD-10-CM | POA: Diagnosis not present

## 2021-08-02 NOTE — Therapy (Signed)
OUTPATIENT PHYSICAL THERAPY THORACOLUMBAR TREATMENT NOTE   Patient Name: Jamie [name] MRN: [id number] DOB:January 02, 1960, 61 y.o., female Today's Date: 08/02/2021   PT End of Session - 08/02/21 0735     Visit Number 2    Number of Visits 12    Date for PT Re-Evaluation 09/12/21    Authorization Type BCBS    PT Start Time 0728    PT Stop Time 0810    PT Time Calculation (min) 42 min    Behavior During Therapy [facility] for tasks assessed/performed             Past Medical History:  Diagnosis Date   Acquired dilation of ascending aorta and aortic root ([place])    36m by 2D echo 03/2019   Allergy    Aortic valve disease    functionally bicuspid AV with fusion of the right and left coronary cusps with no AS by echo 03/2020   Arthritis    in back   Chest pain    neg cath 2016 after false positive myoview   Depression    GERD (gastroesophageal reflux disease)    Hyperglycemia    Hyperlipidemia    Hypertension    Joint pain    Knee pain    Low back pain    Migraine    Mitral valve prolapse    Muscle cramps    Prediabetes    [place]ulmonary HTN ([place]    mild with PASP 358mg by echo 03/2019 but 2637m on echo 03/2020   Past Surgical History:  Procedure Laterality Date   BACK SURGERY     CESAREAN SECTION     LEFT HEART CATHETERIZATION WITH CORONARY ANGIOGRAM N/A 07/08/2013   Procedure: LEFT HEART CATHETERIZATION WITH CORONARY ANGIOGRAM;  Surgeon: PetJosue HectorD;  [facility];  Service: Cardiovascular;  Laterality: N/A;   TUBAL LIGATION     Patient Active Problem List   Diagnosis Date Noted   Symptomatic mammary hypertrophy 07/14/2021   Neck pain 07/14/2021   Aortic valve disease    Abnormal perimenopausal bleeding 12/30/2019   Arthritis 12/30/2019   Menorrhagia 12/30/2019   Mitral valve prolapse 12/30/2019   Uterine leiomyoma 12/30/2019   Vulvitis 12/30/2019   Restless legs 12/30/2019   Congestive heart failure (HCCQulin4/13/2021   Cyst of right breast 04/14/2019    Hyperglycemia 04/14/2019   Acquired dilation of ascending aorta and aortic root (HCC)    Pulmonary HTN (HCC)    Chronic diastolic CHF (congestive heart failure) ([place]CCSanta [place]8/27/2020   DM (diabetes mellitus), type 2 (HCCK[place]8/13/2020   Genital herpes 04/16/2018   Chronic back pain 04/16/2018   Syncope 10/06/2017   Other fatigue 10/01/2017   Shortness of breath on exertion 10/01/2017   Acute low back pain 02/04/2017   Degeneration of lumbar intervertebral disc 02/04/2017   Degenerative lumbar spinal stenosis 02/04/2017   Herpes simplex type 2 infection 06/20/2016   Hyperlipemia 05/06/2015   GERD (gastroesophageal reflux disease) 08/01/2010   Hypertension 01/20/2007   Morbid obesity (HCCDoniphan9/04/2006   Migraine headache 09/05/2006    PCP: HerIsaac BlissstRayford HalstedD  REFERRING PROVIDER: [employer]ah[employer]ina [employer]  REFERRING DIAG: [id number]) - Spinal stenosis, lumbar region without neurogenic claudication  Rationale for Evaluation and Treatment Rehabilitation  THERAPY DIAG:  Spinal stenosis of lumbar region without neurogenic claudication  Muscle weakness (generalized)  Chronic pain of left knee  Chronic pain of right knee  ONSET DATE: 6 months  SUBJECTIVE:  SUBJECTIVE STATEMENT: Pt reports no changes since yesterday's evaluation.  PERTINENT HISTORY:  Thoracic pain, related to Macromastia  Elevated BMI 10 years status post a right L5-S1 discectomy End-stage bilateral knee osteoarthritis, tricompartmental, varus deformity.  Bilateral pes anserine bursitis.  Chronic low back pain.   PAIN:  Are you having pain? Yes: NPRS scale: 4/10 Pain location: bilat lower back, bilat knees Pain description: nagging, dull, sometimes sharpe Aggravating factors: sitting to standing, cold  weather Relieving factors: sitting, meds   PRECAUTIONS: Back  WEIGHT BEARING RESTRICTIONS No  FALLS:  Has patient fallen in last 6 months? Yes. 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   OBJECTIVE:   DIAGNOSTIC FINDINGS:  None in chart.  See above  PATIENT SURVEYS:  FOTO Risk adjusted 40 Primary measure: 58 Goal 59 SCREENING FOR RED FLAGS: None  COGNITION:  Overall cognitive status: Within functional limits for tasks assessed     SENSATION: Peripheral neuropathy, right foot tingling, numb x>5. (As per pt report)   POSTURE: rounded shoulders and increased lumbar lordosis  PALPATION: No tenderness to palpation about LB or hip/buttock  LUMBAR ROM:   Active  A/PROM  eval  Flexion 50% limited by pain  Extension 25%limited by pain  Right lateral flexion wfl  Left lateral flexion wfl  Right rotation wfl  Left rotation wfl   (Blank rows = not tested)  LOWER EXTREMITY ROM:     Active  Right eval Left eval  Hip flexion    Hip extension    Hip abduction    Hip adduction    Hip internal rotation    Hip external rotation    Knee flexion 110 105  Knee extension wfl Wfl   Ankle dorsiflexion    Ankle plantarflexion    Ankle inversion    Ankle eversion     (Blank rows = not tested)  LOWER EXTREMITY MMT:    MMT Right eval Left eval  Hip flexion 35.0 27.0  Hip extension    Hip abduction 26.4 39.8  Hip adduction    Hip internal rotation    Hip external rotation    Knee flexion 40.3 28.7  Knee extension    Ankle dorsiflexion    Ankle plantarflexion    Ankle inversion    Ankle eversion     (Blank rows = not tested)  LUMBAR SPECIAL TESTS:  Slump test: Negative and Trendelenburg sign:  Negative  FUNCTIONAL TESTS:  5 times sit to stand: 22 limited by OA knees> LBP Timed up and go (TUG): 15  GAIT: Distance walked: 500 ft Assistive device utilized: None Level of assistance: Complete Independence Comments: Slight antalgic limp lle, decreased cadence.    TODAY'S TREATMENT  Pt seen for aquatic therapy today.  Treatment took place in water 3.25-4.5 ft in depth at the [place]. Temp of water was 91.  Pt entered/exited the pool via stairs independently with bilat rail.  * forward gait (cues to adjust step length and move trunk to more upright); backward gait; side stepping  * side step with squat (painful) * split squat holding wall x 3, painful; repeated after quad stretch  * quad stretch with foot on 2nd step, 3x each LE * holding wall: hip abdct x 5 x 2; hip  ext x 5 x 2 * noodle between legs:  jumping jacks with soft landing * reviewed form of pt's current exercises (seated on bench, LAQ and hip abdct/add) * standing hamstring stretch with foot on 2nd step * L stretch holding rails * low back stretch with knees/hips flexed, holding rails  Pt requires the buoyancy and hydrostatic pressure of water for support, and to offload joints by unweighting joint load by at least 50 % in navel deep water and by at least 75-80% in chest to neck deep water.  Viscosity of the water is needed for resistance of strengthening. Water current perturbations provides challenge to standing balance requiring increased core activation.     PATIENT EDUCATION:  Education details: Statistician of condition/ properties of water/ benefit of aquatic therapy Person educated: Patient Education method: Explanation Education comprehension: verbalized understanding   HOME EXERCISE PROGRAM: Aquatic HEP to be assigned going forward  ASSESSMENT:  CLINICAL IMPRESSION: Tightness noted in LLE, both quad and hamstring.  She did not tolerate wide squats or split squats well  at this time.  All other exercises tolerated well.  Pt confident in aquatic environment and able to take direction from therapist on deck.  She may need more practice balancing on noodle in future.  Goals are ongoing.   OBJECTIVE IMPAIRMENTS decreased activity tolerance, decreased ROM, decreased strength, increased edema, impaired sensation, obesity, and pain.   ACTIVITY LIMITATIONS lifting, bending, squatting, sleeping, stairs, transfers, and bed mobility  PARTICIPATION LIMITATIONS: cleaning, community activity, and sit to stand transfers from chairs, car, bed  [place], Past/current experiences, and 1-2 comorbidities: morbid obesity, OA knees, macromastia  are also affecting patient's functional outcome.   REHAB POTENTIAL: Good  CLINICAL DECISION MAKING: Evolving/moderate complexity  EVALUATION COMPLEXITY: Moderate   GOALS: Goals reviewed with patient? Yes  SHORT TERM GOALS: Target date: 08/23/2021  Tug under 12 sec to demonstrate improved strength and balance Baseline:15s Goal status: INITIAL  2.  Improved lumbar flex and ext ROM by 25% without increase in pain Baseline: limited by pain  Goal status: INITIAL  3.  Hip flex strength to be improved by 5 lbs Baseline: 35 right; 27 left Goal status: INITIAL  4.  LPB max pain to be decreased by at least 2 points Baseline: 8/10 Goal status: INITIAL    LONG TERM GOALS: Target date: 09/13/2021  Pt will tolerating sleeping in bed part of night to improve rest Baseline: unable to tolerate Goal status: INITIAL  2.  Pt to be indep with final aquatic HEP Baseline: none Goal status: INITIAL  3.  Pt to meet stated FOTO goal Baseline: risk adjusted 40 Goal status: INITIAL  4. Pt will improve bilat knee strength and ROM decreasing knee pain to below 7/10 to better manage OA symptoms  Baseline: pain 9.5/10  Goal Status: INITIAL    PLAN: PT FREQUENCY: 1-2x/week  PT DURATION: 6 weeks  PLANNED  INTERVENTIONS: Therapeutic exercises, Therapeutic activity, Neuromuscular re-education, Balance training, Gait training, Patient/Family education, Self Care, Joint mobilization, Joint manipulation, Stair training, Aquatic Therapy, Electrical stimulation, Spinal manipulation, Spinal mobilization, Cryotherapy, Moist heat, Taping, Traction, Ultrasound, Ionotophoresis '4mg'$ /ml Dexamethasone, and Manual therapy.  PLAN FOR NEXT SESSION: core stretching/strengthening.  Knee strengthening. Aerobic capacity training. Instruction on aquatic and [employer], [place] 08/02/21 2:35 PM

## 2021-08-07 ENCOUNTER — Ambulatory Visit (HOSPITAL_BASED_OUTPATIENT_CLINIC_OR_DEPARTMENT_OTHER): Payer: Self-pay | Admitting: Physical Therapy

## 2021-08-09 ENCOUNTER — Ambulatory Visit (HOSPITAL_BASED_OUTPATIENT_CLINIC_OR_DEPARTMENT_OTHER): Payer: BC Managed Care – PPO | Admitting: Physical Therapy

## 2021-08-09 ENCOUNTER — Encounter (HOSPITAL_BASED_OUTPATIENT_CLINIC_OR_DEPARTMENT_OTHER): Payer: Self-pay | Admitting: Physical Therapy

## 2021-08-09 DIAGNOSIS — M25561 Pain in right knee: Secondary | ICD-10-CM | POA: Diagnosis not present

## 2021-08-09 DIAGNOSIS — M48061 Spinal stenosis, lumbar region without neurogenic claudication: Secondary | ICD-10-CM

## 2021-08-09 DIAGNOSIS — G8929 Other chronic pain: Secondary | ICD-10-CM | POA: Diagnosis not present

## 2021-08-09 DIAGNOSIS — M6281 Muscle weakness (generalized): Secondary | ICD-10-CM | POA: Diagnosis not present

## 2021-08-09 DIAGNOSIS — M25562 Pain in left knee: Secondary | ICD-10-CM | POA: Diagnosis not present

## 2021-08-09 NOTE — Therapy (Signed)
OUTPATIENT PHYSICAL THERAPY THORACOLUMBAR TREATMENT NOTE   Patient Name: Jamie [name] MRN: [id number] DOB:January 13, 1960, 61 y.o., female Today's Date: 08/09/2021   PT End of Session - 08/09/21 0736     Visit Number 3    Number of Visits 12    Date for PT Re-Evaluation 09/12/21    Authorization Type BCBS    PT Start Time 0730    PT Stop Time 0810    PT Time Calculation (min) 40 min    Behavior During Therapy [facility] for tasks assessed/performed              Past Medical History:  Diagnosis Date   Acquired dilation of ascending aorta and aortic root ([place])    45m by 2D echo 03/2019   Allergy    Aortic valve disease    functionally bicuspid AV with fusion of the right and left coronary cusps with no AS by echo 03/2020   Arthritis    in back   Chest pain    neg cath 2016 after false positive myoview   Depression    GERD (gastroesophageal reflux disease)    Hyperglycemia    Hyperlipidemia    Hypertension    Joint pain    Knee pain    Low back pain    Migraine    Mitral valve prolapse    Muscle cramps    Prediabetes    Pulmonary HTN ([place]    mild with PASP 350mg by echo 03/2019 but 2689m on echo 03/2020   Past Surgical History:  Procedure Laterality Date   BACK SURGERY     CESAREAN SECTION     LEFT HEART CATHETERIZATION WITH CORONARY ANGIOGRAM N/A 07/08/2013   Procedure: LEFT HEART CATHETERIZATION WITH CORONARY ANGIOGRAM;  Surgeon: PetJosue HectorD;  [facility];  Service: Cardiovascular;  Laterality: N/A;   TUBAL LIGATION     Patient Active Problem List   Diagnosis Date Noted   Symptomatic mammary hypertrophy 07/14/2021   Neck pain 07/14/2021   Aortic valve disease    Abnormal perimenopausal bleeding 12/30/2019   Arthritis 12/30/2019   Menorrhagia 12/30/2019   Mitral valve prolapse 12/30/2019   Uterine leiomyoma 12/30/2019   Vulvitis 12/30/2019   Restless legs 12/30/2019   Congestive heart failure (HCCBarton Creek4/13/2021   Cyst of right breast  04/14/2019   Hyperglycemia 04/14/2019   Acquired dilation of ascending aorta and aortic root (HCC)    Pulmonary HTN (HCC)    Chronic diastolic CHF (congestive heart failure) (HCCMcCrack[place]8/27/2020   DM (diabetes mellitus), type 2 (HCCTunnel [place]8/13/2020   Genital herpes 04/16/2018   Chronic back pain 04/16/2018   Syncope 10/06/2017   Other fatigue 10/01/2017   Shortness of breath on exertion 10/01/2017   Acute low back pain 02/04/2017   Degeneration of lumbar intervertebral disc 02/04/2017   Degenerative lumbar spinal stenosis 02/04/2017   Herpes simplex type 2 infection 06/20/2016   Hyperlipemia 05/06/2015   GERD (gastroesophageal reflux disease) 08/01/2010   Hypertension 01/20/2007   Morbid obesity (HCCShasta Lake9/04/2006   Migraine headache 09/05/2006    PCP: HerIsaac BlissstRayford HalstedD  REFERRING PROVIDER: DahMelina Schools  REFERRING DIAG: [id number]) - Spinal stenosis, lumbar region without neurogenic claudication  Rationale for Evaluation and Treatment Rehabilitation  THERAPY DIAG:  Spinal stenosis of lumbar region without neurogenic claudication  Muscle weakness (generalized)  Chronic pain of left knee  Chronic pain of right knee  ONSET DATE: 6 months  SUBJECTIVE:  SUBJECTIVE STATEMENT: Pt reports she was sore "all over" the day following the last PT session, "It was a good workout".  She hasn't had a chance to workout until yesterday; went to gym.   PERTINENT HISTORY:  Thoracic pain, related to Macromastia  Elevated BMI 10 years status post a right L5-S1 discectomy End-stage bilateral knee osteoarthritis, tricompartmental, varus deformity.  Bilateral pes anserine bursitis.  Chronic low back pain.   PAIN:  Are you having pain? Yes: NPRS scale: 4/10 Pain location: bilat lower back,  bilat knees Pain description: nagging, dull, sometimes sharpe Aggravating factors: sitting to standing, cold weather Relieving factors: sitting, meds   PRECAUTIONS: Back  WEIGHT BEARING RESTRICTIONS No  FALLS:  Has patient fallen in last 6 months? Yes. 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   OBJECTIVE:   DIAGNOSTIC FINDINGS:  None in chart.  See above  PATIENT SURVEYS:  FOTO Risk adjusted 40 Primary measure: 58 Goal 59 SCREENING FOR RED FLAGS: None  COGNITION:  Overall cognitive status: Within functional limits for tasks assessed     SENSATION: Peripheral neuropathy, right foot tingling, numb x>5. (As per pt report)   POSTURE: rounded shoulders and increased lumbar lordosis  PALPATION: No tenderness to palpation about LB or hip/buttock  LUMBAR ROM:   Active  A/PROM  eval  Flexion 50% limited by pain  Extension 25%limited by pain  Right lateral flexion wfl  Left lateral flexion wfl  Right rotation wfl  Left rotation wfl   (Blank rows = not tested)  LOWER EXTREMITY ROM:     Active  Right eval Left eval  Hip flexion    Hip extension    Hip abduction    Hip adduction    Hip internal rotation    Hip external rotation    Knee flexion 110 105  Knee extension wfl Wfl   Ankle dorsiflexion    Ankle plantarflexion    Ankle inversion    Ankle eversion     (Blank rows = not tested)  LOWER EXTREMITY MMT:    MMT Right eval Left eval  Hip flexion 35.0 27.0  Hip extension    Hip abduction 26.4 39.8  Hip adduction    Hip internal rotation    Hip external rotation    Knee flexion 40.3 28.7  Knee extension    Ankle dorsiflexion    Ankle plantarflexion    Ankle inversion    Ankle eversion      (Blank rows = not tested)  LUMBAR SPECIAL TESTS:  Slump test: Negative and Trendelenburg sign: Negative  FUNCTIONAL TESTS:  5 times sit to stand: 22 limited by OA knees> LBP Timed up and go (TUG): 15  GAIT: Distance walked: 500 ft Assistive device utilized: None Level of assistance: Complete Independence Comments: Slight antalgic limp lle, decreased cadence.    TODAY'S TREATMENT  Pt seen for aquatic therapy today.  Treatment took place in water 3.25-4.5 ft in depth at the [place]. Temp of water was 91.  Pt entered/exited the pool via stairs independently with bilat rail.  * forward gait (cues to adjust foot position to forward and move trunk to more upright); backward gait; side stepping with arm abdct/ add  * holding wall:  squats x 10 (cues  to relax into stretch); hip abdct x 5 x 2; hip ext x 5 x 2 * return to walking forward * blue noodle x 5, thin square noodle x 10 pull downs with ab set * wall push off x 10, elbows at side and core engaged * in wide stance:  bilat shoulder horiz abdct/add; bilat row; bilat press downs at side; abdct/ add * calf stretch, single and double * quad stretch with foot on 2nd step, 2x each LE * hamstring stretch with foot on 2nd step, 1x each * low back stretch with knees/hips flexed, holding rails  Pt requires the buoyancy and hydrostatic pressure of water for support, and to offload joints by unweighting joint load by at least 50 % in navel deep water and by at least 75-80% in chest to neck deep water.  Viscosity of the water is needed for resistance of strengthening. Water current perturbations provides challenge to standing balance requiring increased core activation.     PATIENT EDUCATION:  Education details: Statistician of condition/ properties of water/ benefit of aquatic therapy Person educated: Patient Education method: Explanation Education comprehension: verbalized understanding   HOME EXERCISE  PROGRAM: Aquatic HEP to be assigned going forward  ASSESSMENT:  CLINICAL IMPRESSION: Cues given to correct feet turned out during gait and squats (L is turned out more than R). Continued tightness in LE musculature.  All other exercises tolerated well. Trialed additional core exercises, engaging multiple body areas; will wait to create HEP after finding out what exercises pt tolerates best.  Pt confident in aquatic environment and able to take direction from therapist on deck.  She may need more practice balancing on noodle in future and working on stairs (with LLE).  Goals are ongoing.   OBJECTIVE IMPAIRMENTS decreased activity tolerance, decreased ROM, decreased strength, increased edema, impaired sensation, obesity, and pain.   ACTIVITY LIMITATIONS lifting, bending, squatting, sleeping, stairs, transfers, and bed mobility  PARTICIPATION LIMITATIONS: cleaning, community activity, and sit to stand transfers from chairs, car, bed  [place], Past/current experiences, and 1-2 comorbidities: morbid obesity, OA knees, macromastia  are also affecting patient's functional outcome.   REHAB POTENTIAL: Good  CLINICAL DECISION MAKING: Evolving/moderate complexity  EVALUATION COMPLEXITY: Moderate   GOALS: Goals reviewed with patient? Yes  SHORT TERM GOALS: Target date: 08/30/2021  Tug under 12 sec to demonstrate improved strength and balance Baseline:15s Goal status: INITIAL  2.  Improved lumbar flex and ext ROM by 25% without increase in pain Baseline: limited by pain  Goal status: INITIAL  3.  Hip flex strength to be improved by 5 lbs Baseline: 35 right; 27 left Goal status: INITIAL  4.  LPB max pain to be decreased by at least 2 points Baseline: 8/10 Goal status: INITIAL    LONG TERM GOALS: Target date: 09/20/2021  Pt will tolerating sleeping in bed part of night to improve rest Baseline: unable to tolerate Goal status: INITIAL  2.  Pt to be indep with final  aquatic HEP Baseline: none Goal status: INITIAL  3.  Pt to meet stated FOTO goal Baseline: risk adjusted 40 Goal status: INITIAL  4. Pt will improve bilat knee strength and ROM decreasing knee pain to below 7/10 to better manage OA symptoms  Baseline: pain 9.5/10  Goal Status: INITIAL    PLAN: PT FREQUENCY: 1-2x/week  PT DURATION: 6 weeks  PLANNED INTERVENTIONS: Therapeutic exercises, Therapeutic activity, Neuromuscular re-education, Balance training, Gait training, Patient/Family education, Self Care, Joint mobilization, Joint manipulation, Stair training,  Aquatic Therapy, Electrical stimulation, Spinal manipulation, Spinal mobilization, Cryotherapy, Moist heat, Taping, Traction, Ultrasound, Ionotophoresis '4mg'$ /ml Dexamethasone, and Manual therapy.  PLAN FOR NEXT SESSION: core stretching/strengthening.  Knee strengthening; postural alignment. Aerobic capacity training. Instruction on aquatic and [employer], [place] 08/09/21 9:34 AM

## 2021-08-14 ENCOUNTER — Encounter (HOSPITAL_BASED_OUTPATIENT_CLINIC_OR_DEPARTMENT_OTHER): Payer: Self-pay | Admitting: Physical Therapy

## 2021-08-14 ENCOUNTER — Ambulatory Visit (HOSPITAL_BASED_OUTPATIENT_CLINIC_OR_DEPARTMENT_OTHER): Payer: BC Managed Care – PPO | Admitting: Physical Therapy

## 2021-08-14 DIAGNOSIS — M48061 Spinal stenosis, lumbar region without neurogenic claudication: Secondary | ICD-10-CM

## 2021-08-14 DIAGNOSIS — M6281 Muscle weakness (generalized): Secondary | ICD-10-CM

## 2021-08-14 DIAGNOSIS — M25562 Pain in left knee: Secondary | ICD-10-CM | POA: Diagnosis not present

## 2021-08-14 DIAGNOSIS — M25561 Pain in right knee: Secondary | ICD-10-CM | POA: Diagnosis not present

## 2021-08-14 DIAGNOSIS — G8929 Other chronic pain: Secondary | ICD-10-CM | POA: Diagnosis not present

## 2021-08-14 NOTE — Therapy (Signed)
OUTPATIENT PHYSICAL THERAPY THORACOLUMBAR TREATMENT NOTE   Patient Name: Jamie [name] MRN: [id number] DOB:04-30-1960, 61 y.o., female Today's Date: 08/14/2021   PT End of Session - 08/14/21 0741     Visit Number 4    Number of Visits 12    Date for PT Re-Evaluation 09/12/21    Authorization Type BCBS    PT Start Time 0730    PT Stop Time 0811    PT Time Calculation (min) 41 min    Activity Tolerance Patient tolerated treatment well    Behavior During Therapy [facility] for tasks assessed/performed              Past Medical History:  Diagnosis Date   Acquired dilation of ascending aorta and aortic root ([place])    60m by 2D echo 03/2019   Allergy    Aortic valve disease    functionally bicuspid AV with fusion of the right and left coronary cusps with no AS by echo 03/2020   Arthritis    in back   Chest pain    neg cath 2016 after false positive myoview   Depression    GERD (gastroesophageal reflux disease)    Hyperglycemia    Hyperlipidemia    Hypertension    Joint pain    Knee pain    Low back pain    Migraine    Mitral valve prolapse    Muscle cramps    Prediabetes    Pulmonary HTN ([place]    mild with PASP 3108mg by echo 03/2019 but 2655m on echo 03/2020   Past Surgical History:  Procedure Laterality Date   BACK SURGERY     CESAREAN SECTION     LEFT HEART CATHETERIZATION WITH CORONARY ANGIOGRAM N/A 07/08/2013   Procedure: LEFT HEART CATHETERIZATION WITH CORONARY ANGIOGRAM;  Surgeon: PetJosue HectorD;  [facility];  Service: Cardiovascular;  Laterality: N/A;   TUBAL LIGATION     Patient Active Problem List   Diagnosis Date Noted   Symptomatic mammary hypertrophy 07/14/2021   Neck pain 07/14/2021   Aortic valve disease    Abnormal perimenopausal bleeding 12/30/2019   Arthritis 12/30/2019   Menorrhagia 12/30/2019   Mitral valve prolapse 12/30/2019   Uterine leiomyoma 12/30/2019   Vulvitis 12/30/2019   Restless legs 12/30/2019   Congestive heart  failure ([place]CC[place]4/13/2021   Cyst of right breast 04/14/2019   Hyperglycemia 04/14/2019   Acquired dilation of ascending aorta and aortic root (HCC)    Pulmonary HTN (HCC)    Chronic diastolic CHF (congestive heart failure) (HCC[place]8/27/2020   DM (diabetes mellitus), type 2 ([place]CC[place]8/13/2020   Genital herpes 04/16/2018   Chronic back pain 04/16/2018   Syncope 10/06/2017   Other fatigue 10/01/2017   Shortness of breath on exertion 10/01/2017   Acute low back pain 02/04/2017   Degeneration of lumbar intervertebral disc 02/04/2017   Degenerative lumbar spinal stenosis 02/04/2017   Herpes simplex type 2 infection 06/20/2016   Hyperlipemia 05/06/2015   GERD (gastroesophageal reflux disease) 08/01/2010   Hypertension 01/20/2007   Morbid obesity (HCCHonal[place]9/04/2006   Migraine headache 09/05/2006    PCP: HerIsaac BlissstRayford HalstedD  REFERRING PROVIDER: [employer]  REFERRING DIAG: [phone] [id number]) - Spinal stenosis, lumbar region without neurogenic claudication  Rationale for Evaluation and Treatment Rehabilitation  THERAPY DIAG:  Spinal stenosis of lumbar region without neurogenic claudication  Muscle weakness (generalized)  Chronic pain of left knee  Chronic pain of right knee  ONSET DATE:  6 months  SUBJECTIVE:                                                                                                                                                                                           SUBJECTIVE STATEMENT: Pt reports she walked in water Friday afternoon.  Felt good afterwards.  Later in evening "sneezed wrong" and had increased back pain.  Relieved with multiple Tylenol.    PERTINENT HISTORY:  Thoracic pain, related to Macromastia  Elevated BMI 10 years status post a right L5-S1 discectomy End-stage bilateral knee osteoarthritis, tricompartmental, varus deformity.  Bilateral pes anserine bursitis.  Chronic low back pain.   PAIN:  Are you having pain?  Yes: NPRS scale: 6/10; "just a little"  Pain location: bilat lower back, bilat knees Pain description: nagging, dull, sometimes sharpe Aggravating factors: sitting to standing, cold weather Relieving factors: sitting, meds   PRECAUTIONS: Back  WEIGHT BEARING RESTRICTIONS No  FALLS:  Has patient fallen in last 6 months? Yes. 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   OBJECTIVE:   DIAGNOSTIC FINDINGS:  None in chart.  See above  PATIENT SURVEYS:  FOTO Risk adjusted 40 Primary measure: 58 Goal 59 SCREENING FOR RED FLAGS: None  COGNITION:  Overall cognitive status: Within functional limits for tasks assessed     SENSATION: Peripheral neuropathy, right foot tingling, numb x>5. (As per pt report)   POSTURE: rounded shoulders and increased lumbar lordosis  PALPATION: No tenderness to palpation about LB or hip/buttock  LUMBAR ROM:   Active  A/PROM  eval  Flexion 50% limited by pain  Extension 25%limited by pain  Right lateral flexion wfl  Left lateral flexion wfl  Right rotation wfl  Left rotation wfl   (Blank rows = not tested)  LOWER EXTREMITY ROM:     Active  Right eval Left eval  Hip flexion    Hip extension    Hip abduction    Hip adduction    Hip internal rotation    Hip external rotation    Knee flexion 110 105  Knee extension wfl Wfl   Ankle dorsiflexion    Ankle plantarflexion    Ankle inversion    Ankle eversion     (Blank rows = not tested)  LOWER EXTREMITY MMT:    MMT Right eval Left eval  Hip flexion 35.0 27.0  Hip extension  Hip abduction 26.4 39.8  Hip adduction    Hip internal rotation    Hip external rotation    Knee flexion 40.3 28.7  Knee extension    Ankle  dorsiflexion    Ankle plantarflexion    Ankle inversion    Ankle eversion     (Blank rows = not tested)  LUMBAR SPECIAL TESTS:  Slump test: Negative and Trendelenburg sign: Negative  FUNCTIONAL TESTS:  5 times sit to stand: 22 limited by OA knees> LBP Timed up and go (TUG): 15  GAIT: Distance walked: 500 ft Assistive device utilized: None Level of assistance: Complete Independence Comments: Slight antalgic limp lle, decreased cadence.    TODAY'S TREATMENT  Pt seen for aquatic therapy today.  Treatment took place in water 3.25-4.5 ft in depth at the [facility]. Temp of water was 91.  Pt entered/exited the pool via stairs independently with bilat rail.  * forward gait (cues to adjust foot position to forward and move trunk to more upright); backward gait; side stepping with arm abdct/ add  * Holding yellow noodle: hip abdct x 5 x 2; hip ext x 5 x 2;  hip flex (straight knee kicks)  5 x 2.  * Trial of single hip abdct, flex, ext with wall support to assess change in pain level * holding yellow noodle: side squat - painful/ stopped; high knee marching forward/backward - forward is painful * low back stretch with knees/hips flexed, holding rails * quad stretch with foot on 2nd step, 2x each LE- some cramping in Rt hamstring * hamstring stretch with foot on 2nd step, 1x each; trial fig 4 - difficult to attain * Seated on 4th step, holding rails: flutter, hip abdct/ add, bicycle; holding yellow kick board - forward lumbar stretch * Seated on yellow noodle, holding yellow hand buoys: finding COB, easing into deeper water and attempts at cycling LE; then holding wall at corner and returning to cycling    Pt requires the buoyancy and hydrostatic pressure of water for support, and to offload joints by unweighting joint load by at least 50 % in navel deep water and by at least 75-80% in chest to neck deep water.  Viscosity of the water is needed for resistance of strengthening.  Water current perturbations provides challenge to standing balance requiring increased core activation.     PATIENT EDUCATION:  Education details: Statistician of condition/ properties of water/ benefit of aquatic therapy Person educated: Patient Education method: Explanation Education comprehension: verbalized understanding   HOME EXERCISE PROGRAM: Aquatic HEP to be assigned going forward  ASSESSMENT:  CLINICAL IMPRESSION: Cues given to correct feet turned out during gait and squats (L is turned out more than R). Continued tightness in knee musculature.  No change in reported pain during session. Will wait to create HEP until after finding out what exercises pt tolerates best.  She may need more practice balancing on noodle in future and working on stairs (with LLE).  Goals are ongoing.   OBJECTIVE IMPAIRMENTS decreased activity tolerance, decreased ROM, decreased strength, increased edema, impaired sensation, obesity, and pain.   ACTIVITY LIMITATIONS lifting, bending, squatting, sleeping, stairs, transfers, and bed mobility  PARTICIPATION LIMITATIONS: cleaning, community activity, and sit to stand transfers from chairs, car, bed  [place], Past/current experiences, and 1-2 comorbidities: morbid obesity, OA knees, macromastia  are also affecting patient's functional outcome.   REHAB POTENTIAL: Good  CLINICAL DECISION MAKING: Evolving/moderate complexity  EVALUATION COMPLEXITY: Moderate   GOALS:  Goals reviewed with patient? Yes  SHORT TERM GOALS: Target date: 09/04/2021  Tug under 12 sec to demonstrate improved strength and balance Baseline:15s Goal status: INITIAL  2.  Improved lumbar flex and ext ROM by 25% without increase in pain Baseline: limited by pain  Goal status: INITIAL  3.  Hip flex strength to be improved by 5 lbs Baseline: 35 right; 27 left Goal status: INITIAL  4.  LPB max pain to be decreased by at least 2 points Baseline:  8/10 Goal status: INITIAL    LONG TERM GOALS: Target date: 09/25/2021  Pt will tolerating sleeping in bed part of night to improve rest Baseline: unable to tolerate Goal status: INITIAL  2.  Pt to be indep with final aquatic HEP Baseline: none Goal status: INITIAL  3.  Pt to meet stated FOTO goal Baseline: risk adjusted 40 Goal status: INITIAL  4. Pt will improve bilat knee strength and ROM decreasing knee pain to below 7/10 to better manage OA symptoms  Baseline: pain 9.5/10  Goal Status: INITIAL    PLAN: PT FREQUENCY: 1-2x/week  PT DURATION: 6 weeks  PLANNED INTERVENTIONS: Therapeutic exercises, Therapeutic activity, Neuromuscular re-education, Balance training, Gait training, Patient/Family education, Self Care, Joint mobilization, Joint manipulation, Stair training, Aquatic Therapy, Electrical stimulation, Spinal manipulation, Spinal mobilization, Cryotherapy, Moist heat, Taping, Traction, Ultrasound, Ionotophoresis '4mg'$ /ml Dexamethasone, and Manual therapy.  PLAN FOR NEXT SESSION: core stretching/strengthening.  Knee strengthening; postural alignment. Aerobic capacity training. Instruction on aquatic and [employer], [place] 08/14/21 11:54 AM

## 2021-08-16 ENCOUNTER — Ambulatory Visit (HOSPITAL_BASED_OUTPATIENT_CLINIC_OR_DEPARTMENT_OTHER): Payer: BC Managed Care – PPO | Admitting: Physical Therapy

## 2021-08-16 ENCOUNTER — Encounter (HOSPITAL_BASED_OUTPATIENT_CLINIC_OR_DEPARTMENT_OTHER): Payer: Self-pay | Admitting: Physical Therapy

## 2021-08-16 DIAGNOSIS — M25562 Pain in left knee: Secondary | ICD-10-CM | POA: Diagnosis not present

## 2021-08-16 DIAGNOSIS — M6281 Muscle weakness (generalized): Secondary | ICD-10-CM

## 2021-08-16 DIAGNOSIS — M48061 Spinal stenosis, lumbar region without neurogenic claudication: Secondary | ICD-10-CM | POA: Diagnosis not present

## 2021-08-16 DIAGNOSIS — M25561 Pain in right knee: Secondary | ICD-10-CM | POA: Diagnosis not present

## 2021-08-16 DIAGNOSIS — G8929 Other chronic pain: Secondary | ICD-10-CM | POA: Diagnosis not present

## 2021-08-16 NOTE — Therapy (Signed)
OUTPATIENT PHYSICAL THERAPY THORACOLUMBAR TREATMENT NOTE   Patient Name: Jamie [name] MRN: [id number] DOB:07-May-1960, 61 y.o., female Today's Date: 08/16/2021   PT End of Session - 08/16/21 0739     Visit Number 5    Number of Visits 12    Date for PT Re-Evaluation 09/12/21    Authorization Type BCBS    PT Start Time 0730    PT Stop Time 0810    PT Time Calculation (min) 40 min    Activity Tolerance Patient tolerated treatment well    Behavior During Therapy [facility] for tasks assessed/performed              Past Medical History:  Diagnosis Date   Acquired dilation of ascending aorta and aortic root ([place])    8m by 2D echo 03/2019   Allergy    Aortic valve disease    functionally bicuspid AV with fusion of the right and left coronary cusps with no AS by echo 03/2020   Arthritis    in back   Chest pain    neg cath 2016 after false positive myoview   Depression    GERD (gastroesophageal reflux disease)    Hyperglycemia    Hyperlipidemia    Hypertension    Joint pain    Knee pain    Low back pain    Migraine    Mitral valve prolapse    Muscle cramps    Prediabetes    Pulmonary HTN ([place]    mild with PASP 336mg by echo 03/2019 but 2662m on echo 03/2020   Past Surgical History:  Procedure Laterality Date   BACK SURGERY     CESAREAN SECTION     LEFT HEART CATHETERIZATION WITH CORONARY ANGIOGRAM N/A 07/08/2013   Procedure: LEFT HEART CATHETERIZATION WITH CORONARY ANGIOGRAM;  Surgeon: PetJosue HectorD;  [facility];  Service: Cardiovascular;  Laterality: N/A;   TUBAL LIGATION     Patient Active Problem List   Diagnosis Date Noted   Symptomatic mammary hypertrophy 07/14/2021   Neck pain 07/14/2021   Aortic valve disease    Abnormal perimenopausal bleeding 12/30/2019   Arthritis 12/30/2019   Menorrhagia 12/30/2019   Mitral valve prolapse 12/30/2019   Uterine leiomyoma 12/30/2019   Vulvitis 12/30/2019   Restless legs 12/30/2019   Congestive heart  failure (HCCDeepstep4/13/2021   Cyst of right breast 04/14/2019   Hyperglycemia 04/14/2019   Acquired dilation of ascending aorta and aortic root (HCC)    Pulmonary HTN (HCC)    Chronic diastolic CHF (congestive heart failure) (HCCBet[place]8/27/2020   DM (diabetes mellitus), type 2 (HCCTark[place]8/13/2020   Genital herpes 04/16/2018   Chronic back pain 04/16/2018   Syncope 10/06/2017   Other fatigue 10/01/2017   Shortness of breath on exertion 10/01/2017   Acute low back pain 02/04/2017   Degeneration of lumbar intervertebral disc 02/04/2017   Degenerative lumbar spinal stenosis 02/04/2017   Herpes simplex type 2 infection 06/20/2016   Hyperlipemia 05/06/2015   GERD (gastroesophageal reflux disease) 08/01/2010   Hypertension 01/20/2007   Morbid obesity (HCCApp[place]9/04/2006   Migraine headache 09/05/2006    PCP: HerIsaac BlissstRayford HalstedD  REFERRING PROVIDER: [employer]ah[employer]ina [employer]  REFERRING DIAG: [phone]) - Spinal stenosis, lumbar region without neurogenic claudication  Rationale for Evaluation and Treatment Rehabilitation  THERAPY DIAG:  Spinal stenosis of lumbar region without neurogenic claudication  Muscle weakness (generalized)  Chronic pain of left knee  Chronic pain of right knee  ONSET DATE:  6 months  SUBJECTIVE:                                                                                                                                                                                           SUBJECTIVE STATEMENT: "I'm still in pain."    PERTINENT HISTORY:  Thoracic pain, related to Macromastia  Elevated BMI 10 years status post a right L5-S1 discectomy End-stage bilateral knee osteoarthritis, tricompartmental, varus deformity.  Bilateral pes anserine bursitis.  Chronic low back pain.   PAIN:  Are you having pain? Yes: NPRS scale: 6-7/10;  Pain location: bilat lower back, bilat knees Pain description: nagging, dull, sometimes sharpe Aggravating factors:  sitting to standing, cold weather Relieving factors: sitting, meds   PRECAUTIONS: Back  WEIGHT BEARING RESTRICTIONS No  FALLS:  Has patient fallen in last 6 months? Yes. 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   OBJECTIVE:   DIAGNOSTIC FINDINGS:  None in chart.  See above  PATIENT SURVEYS:  FOTO Risk adjusted 40 Primary measure: 58 Goal 59 SCREENING FOR RED FLAGS: None  COGNITION:  Overall cognitive status: Within functional limits for tasks assessed     SENSATION: Peripheral neuropathy, right foot tingling, numb x>5. (As per pt report)   POSTURE: rounded shoulders and increased lumbar lordosis  PALPATION: No tenderness to palpation about LB or hip/buttock  LUMBAR ROM:   Active  A/PROM  eval  Flexion 50% limited by pain  Extension 25%limited by pain  Right lateral flexion wfl  Left lateral flexion wfl  Right rotation wfl  Left rotation wfl   (Blank rows = not tested)  LOWER EXTREMITY ROM:     Active  Right eval Left eval  Hip flexion    Hip extension    Hip abduction    Hip adduction    Hip internal rotation    Hip external rotation    Knee flexion 110 105  Knee extension wfl Wfl   Ankle dorsiflexion    Ankle plantarflexion    Ankle inversion    Ankle eversion     (Blank rows = not tested)  LOWER EXTREMITY MMT:    MMT Right eval Left eval  Hip flexion 35.0 27.0  Hip extension    Hip abduction 26.4 39.8  Hip adduction    Hip internal rotation    Hip external rotation    Knee flexion 40.3 28.7  Knee extension    Ankle dorsiflexion    Ankle plantarflexion    Ankle inversion    Ankle eversion     (Blank rows = not tested)  LUMBAR SPECIAL TESTS:  Slump test: Negative and  Trendelenburg sign: Negative  FUNCTIONAL TESTS:  5 times sit to stand: 22 limited by OA knees> LBP Timed up and go (TUG): 15  GAIT: Distance walked: 500 ft Assistive device utilized: None Level of assistance: Complete Independence Comments: Slight antalgic limp lle, decreased cadence.    TODAY'S TREATMENT  Pt seen for aquatic therapy today.  Treatment took place in water 3.25-4.5 ft in depth at the [facility]. Temp of water was 91.  Pt entered/exited the pool via stairs independently with bilat rail.  * forward gait; backward gait; side stepping with arm abdct/ add  * Monster walk forward * Straddling yellow noodle and holding wall in corner- cycling forward/ backward and hip abdct/ add feet suspended * holding yellow hand buoys: finding COB ( challenging) * holding yellow hand buoys at side - forward/ backward * STS at bench in water x 10 with cues on core engagement, hip hinge and neutral spine (mod cues) * TUG like exercise x 2 reps; repeated with side stepping x 2 * Seated on bench - LAQ with DF for dynamic calf stretch * hamstring stretch with foot on 2nd step * single/double calf stretch with heel off of step   Pt requires the buoyancy and hydrostatic pressure of water for support, and to offload joints by unweighting joint load by at least 50 % in navel deep water and by at least 75-80% in chest to neck deep water.  Viscosity of the water is needed for resistance of strengthening. Water current perturbations provides challenge to standing balance requiring increased core activation.     PATIENT EDUCATION:  Education details: Statistician of condition/ properties of water/ benefit of aquatic therapy Person educated: Patient Education method: Explanation Education comprehension: verbalized understanding   HOME EXERCISE PROGRAM: Aquatic HEP to be assigned going forward  ASSESSMENT:  CLINICAL IMPRESSION: Pain level for knees and lower back  remain unchanged during session, except when in deeper water supported by yellow noodle and hold the wall in corner - pain was almost completely eliminated.  Still trying to find ideal water exercises for pt as increased knee/hip flexion, even submerged irritated her knees and lower back.  Pt is progressing gradually towards goals.   OBJECTIVE IMPAIRMENTS decreased activity tolerance, decreased ROM, decreased strength, increased edema, impaired sensation, obesity, and pain.   ACTIVITY LIMITATIONS lifting, bending, squatting, sleeping, stairs, transfers, and bed mobility  PARTICIPATION LIMITATIONS: cleaning, community activity, and sit to stand transfers from chairs, car, bed  [place], Past/current experiences, and 1-2 comorbidities: morbid obesity, OA knees, macromastia  are also affecting patient's functional outcome.   REHAB POTENTIAL: Good  CLINICAL DECISION MAKING: Evolving/moderate complexity  EVALUATION COMPLEXITY: Moderate   GOALS: Goals reviewed with patient? Yes  SHORT TERM GOALS: Target date: 09/06/2021  Tug under 12 sec to demonstrate improved strength and balance Baseline:15s Goal status: INITIAL  2.  Improved lumbar flex and ext ROM by 25% without increase in pain Baseline: limited by pain  Goal status: INITIAL  3.  Hip flex strength to be improved by 5 lbs Baseline: 35 right; 27 left Goal status: INITIAL  4.  LBP max pain to be decreased by at least 2 points Baseline: 8/10 Goal status: INITIAL    LONG TERM GOALS:  Target date: 09/27/2021  Pt will tolerating sleeping in bed part of night to improve rest Baseline: unable to tolerate Goal status: INITIAL  2.  Pt to be indep with final aquatic HEP Baseline: none Goal status: INITIAL  3.  Pt to meet stated FOTO goal Baseline: risk adjusted 40 Goal status: INITIAL  4. Pt will improve bilat knee strength and ROM decreasing knee pain to below 7/10 to better manage OA symptoms  Baseline: pain  9.5/10  Goal Status: INITIAL    PLAN: PT FREQUENCY: 1-2x/week  PT DURATION: 6 weeks  PLANNED INTERVENTIONS: Therapeutic exercises, Therapeutic activity, Neuromuscular re-education, Balance training, Gait training, Patient/Family education, Self Care, Joint mobilization, Joint manipulation, Stair training, Aquatic Therapy, Electrical stimulation, Spinal manipulation, Spinal mobilization, Cryotherapy, Moist heat, Taping, Traction, Ultrasound, Ionotophoresis '4mg'$ /ml Dexamethasone, and Manual therapy.  PLAN FOR NEXT SESSION: core stretching/strengthening.  Knee strengthening; postural alignment. Aerobic capacity training. Instruction on aquatic and [employer], [place] 08/16/21 1:57 PM

## 2021-08-21 ENCOUNTER — Ambulatory Visit (HOSPITAL_BASED_OUTPATIENT_CLINIC_OR_DEPARTMENT_OTHER): Payer: Self-pay | Admitting: Physical Therapy

## 2021-08-23 ENCOUNTER — Ambulatory Visit (HOSPITAL_BASED_OUTPATIENT_CLINIC_OR_DEPARTMENT_OTHER): Payer: BC Managed Care – PPO | Admitting: Physical Therapy

## 2021-08-23 ENCOUNTER — Encounter (HOSPITAL_BASED_OUTPATIENT_CLINIC_OR_DEPARTMENT_OTHER): Payer: Self-pay | Admitting: Physical Therapy

## 2021-08-23 DIAGNOSIS — M48061 Spinal stenosis, lumbar region without neurogenic claudication: Secondary | ICD-10-CM | POA: Diagnosis not present

## 2021-08-23 DIAGNOSIS — M25562 Pain in left knee: Secondary | ICD-10-CM | POA: Diagnosis not present

## 2021-08-23 DIAGNOSIS — M6281 Muscle weakness (generalized): Secondary | ICD-10-CM | POA: Diagnosis not present

## 2021-08-23 DIAGNOSIS — G8929 Other chronic pain: Secondary | ICD-10-CM

## 2021-08-23 DIAGNOSIS — M25561 Pain in right knee: Secondary | ICD-10-CM | POA: Diagnosis not present

## 2021-08-23 NOTE — Therapy (Signed)
OUTPATIENT PHYSICAL THERAPY THORACOLUMBAR TREATMENT NOTE   Patient Name: Jamie [name] MRN: [id number] DOB:September 20, 1960, 61 y.o., female Today's Date: 08/23/2021   PT End of Session - 08/23/21 0739     Visit Number 6    Number of Visits 12    Date for PT Re-Evaluation 09/12/21    Authorization Type BCBS    PT Start Time 0730    PT Stop Time 0809    PT Time Calculation (min) 39 min    Activity Tolerance Patient tolerated treatment well    Behavior During Therapy [facility] for tasks assessed/performed              Past Medical History:  Diagnosis Date   Acquired dilation of ascending aorta and aortic root ([place])    76m by 2D echo 03/2019   Allergy    Aortic valve disease    functionally bicuspid AV with fusion of the right and left coronary cusps with no AS by echo 03/2020   Arthritis    in back   Chest pain    neg cath 2016 after false positive myoview   Depression    GERD (gastroesophageal reflux disease)    Hyperglycemia    Hyperlipidemia    Hypertension    Joint pain    Knee pain    Low back pain    Migraine    Mitral valve prolapse    Muscle cramps    Prediabetes    [place] HT[place] ([place]    mild with PASP 363mg by echo 03/2019 but 2672m on echo 03/2020   Past Surgical History:  Procedure Laterality Date   BACK SURGERY     CESAREAN SECTION     LEFT HEART CATHETERIZATION WITH CORONARY ANGIOGRAM N/A 07/08/2013   Procedure: LEFT HEART CATHETERIZATION WITH CORONARY ANGIOGRAM;  Surgeon: PetJosue HectorD;  [facility];  Service: Cardiovascular;  Laterality: N/A;   TUBAL LIGATION     Patient Active Problem List   Diagnosis Date Noted   Symptomatic mammary hypertrophy 07/14/2021   Neck pain 07/14/2021   Aortic valve disease    Abnormal perimenopausal bleeding 12/30/2019   Arthritis 12/30/2019   Menorrhagia 12/30/2019   Mitral valve prolapse 12/30/2019   Uterine leiomyoma 12/30/2019   Vulvitis 12/30/2019   Restless legs 12/30/2019   Congestive heart  failure ([place]CC[place]4/13/2021   Cyst of right breast 04/14/2019   Hyperglycemia 04/14/2019   Acquired dilation of ascending aorta and aortic root (HCC)    Pulmonary HTN (HCC)    Chronic diastolic CHF (congestive heart failure) (HCC[place]8/27/2020   DM (diabetes mellitus), type 2 (HCCCast[place]8/13/2020   Genital herpes 04/16/2018   Chronic back pain 04/16/2018   Syncope 10/06/2017   Other fatigue 10/01/2017   Shortness of breath on exertion 10/01/2017   Acute low back pain 02/04/2017   Degeneration of lumbar intervertebral disc 02/04/2017   Degenerative lumbar spinal stenosis 02/04/2017   Herpes simplex type 2 infection 06/20/2016   Hyperlipemia 05/06/2015   GERD (gastroesophageal reflux disease) 08/01/2010   Hypertension 01/20/2007   Morbid obesity (HCCSan Pas[place]9/04/2006   Migraine headache 09/05/2006    PCP: HerIsaac BlissstRayford HalstedD  REFERRING PROVIDER: [employer]  REFERRING DIAG: [id number]) - Spinal stenosis, lumbar region without neurogenic claudication  Rationale for Evaluation and Treatment Rehabilitation  THERAPY DIAG:  Spinal stenosis of lumbar region without neurogenic claudication  Muscle weakness (generalized)  Chronic pain of left knee  Chronic pain of right knee  ONSET DATE:  6 months  SUBJECTIVE:                                                                                                                                                                                           SUBJECTIVE STATEMENT: Pt reports she has increased pain since returning from vacation, "I did a lot of walking and the bed was hard".  She reports increased catching in Rt knee with driving.   PERTINENT HISTORY:  Thoracic pain, related to Macromastia  Elevated BMI 10 years status post a right L5-S1 discectomy End-stage bilateral knee osteoarthritis, tricompartmental, varus deformity.  Bilateral pes anserine bursitis.  Chronic low back pain.   PAIN:  Are you having pain?  Yes: NPRS scale: 8-9/10;  Pain location: bilat lower back, Right knee Pain description: nagging, dull, sometimes sharp Aggravating factors: sitting to standing, cold weather Relieving factors: sitting, meds   PRECAUTIONS: Back  WEIGHT BEARING RESTRICTIONS No  FALLS:  Has patient fallen in last 6 months? Yes. 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   OBJECTIVE:   DIAGNOSTIC FINDINGS:  None in chart.  See above  PATIENT SURVEYS:  FOTO Risk adjusted 40 Primary measure: 58 Goal 59 SCREENING FOR RED FLAGS: None  COGNITION:  Overall cognitive status: Within functional limits for tasks assessed     SENSATION: Peripheral neuropathy, right foot tingling, numb x>5. (As per pt report)   POSTURE: rounded shoulders and increased lumbar lordosis  PALPATION: No tenderness to palpation about LB or hip/buttock  LUMBAR ROM:   Active  A/PROM  eval  Flexion 50% limited by pain  Extension 25%limited by pain  Right lateral flexion wfl  Left lateral flexion wfl  Right rotation wfl  Left rotation wfl   (Blank rows = not tested)  LOWER EXTREMITY ROM:     Active  Right eval Left eval  Hip flexion    Hip extension    Hip abduction    Hip adduction    Hip internal rotation    Hip external rotation    Knee flexion 110 105  Knee extension wfl Wfl   Ankle dorsiflexion    Ankle plantarflexion    Ankle inversion    Ankle eversion     (Blank rows = not tested)  LOWER EXTREMITY MMT:    MMT Right eval Left eval  Hip flexion 35.0 27.0  Hip extension    Hip  abduction 26.4 39.8  Hip adduction    Hip internal rotation    Hip external rotation    Knee flexion 40.3 28.7  Knee extension    Ankle dorsiflexion     Ankle plantarflexion    Ankle inversion    Ankle eversion     (Blank rows = not tested)  LUMBAR SPECIAL TESTS:  Slump test: Negative and Trendelenburg sign: Negative  FUNCTIONAL TESTS:  5 times sit to stand: 22 limited by OA knees> LBP Timed up and go (TUG): 15  GAIT: Distance walked: 500 ft Assistive device utilized: None Level of assistance: Complete Independence Comments: Slight antalgic limp lle, decreased cadence.    TODAY'S TREATMENT  Pt seen for aquatic therapy today.  Treatment took place in water 3.25-4.5 ft in depth at the [place]. Temp of water was 91.  Pt entered/exited the pool via stairs independently with bilat rail.  * forward gait; backward gait; side stepping with arm abdct/ add  * attempted marching - increased R knee pain; attempted squat at wall - increased R knee pain * Straddling yellow noodle and holding wall in corner- cycling forward- increased R knee pain;  hip abdct/ add feet suspended * Holding wall:  hip abdct x 5 x 2; hip ext x5 x 2 * STS at bench in water x 1; increased knee pain.  * Seated on bench, slow LAQ with cues for neutral foot and knee position (improved tolerance) * STS at bench with cues for core engaged and neutral foot/knee position x 7  * quad stretch with foot on 2nd step behind her * hamstring stretch with foot on 3rd step * double calf stretch with heel off of step * L stretch with bent knees   * after dried off- applied reg Rock tape to bilat medial knee joints to decompress tissue and increase proprioception  Pt requires the buoyancy and hydrostatic pressure of water for support, and to offload joints by unweighting joint load by at least 50 % in navel deep water and by at least 75-80% in chest to neck deep water.  Viscosity of the water is needed for resistance of strengthening. Water current perturbations provides challenge to standing balance requiring increased core activation.     PATIENT EDUCATION:   Education details: Statistician of condition/ properties of water/ benefit of aquatic therapy Person educated: Patient Education method: Explanation Education comprehension: verbalized understanding   HOME EXERCISE PROGRAM: Aquatic HEP to be assigned going forward  ASSESSMENT:  CLINICAL IMPRESSION: Pt presents with decreased exercise tolerance and increased pain with any Rt knee flexion.  Pain is slightly less during LAQ when pt corrects foot/knee position to more neutral (vs ER).  Pt moved in very guarded manner throughout session.  Continued pain education and discussed expectation during aquatic session (decreased vs full elimination of pain)  Pt is progressing gradually towards goals.   OBJECTIVE IMPAIRMENTS decreased activity tolerance, decreased ROM, decreased strength, increased edema, impaired sensation, obesity, and pain.   ACTIVITY LIMITATIONS lifting, bending, squatting, sleeping, stairs, transfers, and bed mobility  PARTICIPATION LIMITATIONS: cleaning, community activity, and sit to stand transfers from chairs, car, bed  [place], Past/current experiences, and 1-2 comorbidities: morbid obesity, OA knees, macromastia  are also affecting patient's functional outcome.   REHAB POTENTIAL: Good  CLINICAL DECISION MAKING: Evolving/moderate complexity  EVALUATION COMPLEXITY: Moderate   GOALS: Goals reviewed with patient? Yes  SHORT TERM GOALS: Target date: 09/13/2021  Tug under 12 sec to demonstrate improved  strength and balance Baseline:15s Goal status: INITIAL  2.  Improved lumbar flex and ext ROM by 25% without increase in pain Baseline: limited by pain  Goal status: INITIAL  3.  Hip flex strength to be improved by 5 lbs Baseline: 35 right; 27 left Goal status: INITIAL  4.  LBP max pain to be decreased by at least 2 points Baseline: 8/10 Goal status: INITIAL    LONG TERM GOALS: Target date: 10/04/2021  Pt will tolerating sleeping  in bed part of night to improve rest Baseline: unable to tolerate Goal status: INITIAL  2.  Pt to be indep with final aquatic HEP Baseline: none Goal status: INITIAL  3.  Pt to meet stated FOTO goal Baseline: risk adjusted 40 Goal status: INITIAL  4. Pt will improve bilat knee strength and ROM decreasing knee pain to below 7/10 to better manage OA symptoms  Baseline: pain 9.5/10  Goal Status: INITIAL    PLAN: PT FREQUENCY: 1-2x/week  PT DURATION: 6 weeks  PLANNED INTERVENTIONS: Therapeutic exercises, Therapeutic activity, Neuromuscular re-education, Balance training, Gait training, Patient/Family education, Self Care, Joint mobilization, Joint manipulation, Stair training, Aquatic Therapy, Electrical stimulation, Spinal manipulation, Spinal mobilization, Cryotherapy, Moist heat, Taping, Traction, Ultrasound, Ionotophoresis '4mg'$ /ml Dexamethasone, and Manual therapy.  PLAN FOR NEXT SESSION: core stretching/strengthening.  Knee strengthening; postural alignment. Aerobic capacity training. Instruction on aquatic and [employer], [place] 08/23/21 8:13 AM

## 2021-08-25 ENCOUNTER — Ambulatory Visit (HOSPITAL_BASED_OUTPATIENT_CLINIC_OR_DEPARTMENT_OTHER): Payer: BC Managed Care – PPO | Admitting: Physical Therapy

## 2021-08-28 ENCOUNTER — Encounter (HOSPITAL_BASED_OUTPATIENT_CLINIC_OR_DEPARTMENT_OTHER): Payer: Self-pay | Admitting: Physical Therapy

## 2021-08-28 ENCOUNTER — Ambulatory Visit (HOSPITAL_BASED_OUTPATIENT_CLINIC_OR_DEPARTMENT_OTHER): Payer: BC Managed Care – PPO | Admitting: Physical Therapy

## 2021-08-28 DIAGNOSIS — G8929 Other chronic pain: Secondary | ICD-10-CM

## 2021-08-28 DIAGNOSIS — M25561 Pain in right knee: Secondary | ICD-10-CM | POA: Diagnosis not present

## 2021-08-28 DIAGNOSIS — M6281 Muscle weakness (generalized): Secondary | ICD-10-CM

## 2021-08-28 DIAGNOSIS — M48061 Spinal stenosis, lumbar region without neurogenic claudication: Secondary | ICD-10-CM

## 2021-08-28 DIAGNOSIS — M25562 Pain in left knee: Secondary | ICD-10-CM | POA: Diagnosis not present

## 2021-08-28 NOTE — Therapy (Signed)
OUTPATIENT PHYSICAL THERAPY THORACOLUMBAR TREATMENT NOTE   Patient Name: Jamie [name] MRN: [id number] DOB:04/05/1960, 61 y.o., female Today's Date: 08/28/2021   PT End of Session - 08/28/21 0747     Visit Number 7    Number of Visits 12    Date for PT Re-Evaluation 09/12/21    Authorization Type BCBS    PT Start Time 0728    PT Stop Time 0810    PT Time Calculation (min) 42 min    Behavior During Therapy [facility] for tasks assessed/performed              Past Medical History:  Diagnosis Date   Acquired dilation of ascending aorta and aortic root ([place])    41m by 2D echo 03/2019   Allergy    Aortic valve disease    functionally bicuspid AV with fusion of the right and left coronary cusps with no AS by echo 03/2020   Arthritis    in back   Chest pain    neg cath 2016 after false positive myoview   Depression    GERD (gastroesophageal reflux disease)    Hyperglycemia    Hyperlipidemia    Hypertension    Joint pain    Knee pain    Low back pain    Migraine    Mitral valve prolapse    Muscle cramps    Prediabetes    Pulmonary HTN (H[place]    mild with PASP 353mg by echo 03/2019 but 2631m on echo 03/2020   Past Surgical History:  Procedure Laterality Date   BACK SURGERY     CESAREAN SECTION     LEFT HEART CATHETERIZATION WITH CORONARY ANGIOGRAM N/A 07/08/2013   Procedure: LEFT HEART CATHETERIZATION WITH CORONARY ANGIOGRAM;  Surgeon: PetJosue HectorD;  [facility];  Service: Cardiovascular;  Laterality: N/A;   TUBAL LIGATION     Patient Active Problem List   Diagnosis Date Noted   Symptomatic mammary hypertrophy 07/14/2021   Neck pain 07/14/2021   Aortic valve disease    Abnormal perimenopausal bleeding 12/30/2019   Arthritis 12/30/2019   Menorrhagia 12/30/2019   Mitral valve prolapse 12/30/2019   Uterine leiomyoma 12/30/2019   Vulvitis 12/30/2019   Restless legs 12/30/2019   Congestive heart failure (HCCPet[place]4/13/2021   Cyst of right breast  04/14/2019   Hyperglycemia 04/14/2019   Acquired dilation of ascending aorta and aortic root (HCC)    Pulmonary HTN (HCC)    Chronic diastolic CHF (congestive heart failure) (HCCMount Gret[place]8/27/2020   DM (diabetes mellitus), type 2 (HCCRed [place]8/13/2020   Genital herpes 04/16/2018   Chronic back pain 04/16/2018   Syncope 10/06/2017   Other fatigue 10/01/2017   Shortness of breath on exertion 10/01/2017   Acute low back pain 02/04/2017   Degeneration of lumbar intervertebral disc 02/04/2017   Degenerative lumbar spinal stenosis 02/04/2017   Herpes simplex type 2 infection 06/20/2016   Hyperlipemia 05/06/2015   GERD (gastroesophageal reflux disease) 08/01/2010   Hypertension 01/20/2007   Morbid obesity (HCCL[place]9/04/2006   Migraine headache 09/05/2006    PCP: HerIsaac BlissstRayford HalstedD  REFERRING PROVIDER: DahMelina Schools  REFERRING DIAG: [id number]) - Spinal stenosis, lumbar region without neurogenic claudication  Rationale for Evaluation and Treatment Rehabilitation  THERAPY DIAG:  Spinal stenosis of lumbar region without neurogenic claudication  Muscle weakness (generalized)  Chronic pain of left knee  Chronic pain of right knee  ONSET DATE: 6 months  SUBJECTIVE:  SUBJECTIVE STATEMENT: Pt reports she is feeling a little better. "I just wish I could get it (Rt knee) to pop then it would feel better".  She has been doing stretches since last visit.   PERTINENT HISTORY:  Thoracic pain, related to Macromastia  Elevated BMI 10 years status post a right L5-S1 discectomy End-stage bilateral knee osteoarthritis, tricompartmental, varus deformity.  Bilateral pes anserine bursitis.  Chronic low back pain.   PAIN:  Are you having pain? Yes: NPRS scale: 5/10 bilat lower back and Lt knee; 6/10  Rt knee  Pain location: see above Pain description: nagging, dull, sometimes sharp Aggravating factors: sitting to standing, cold weather Relieving factors: sitting, meds   PRECAUTIONS: Back  WEIGHT BEARING RESTRICTIONS No  FALLS:  Has patient fallen in last 6 months? Yes. 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   OBJECTIVE:   DIAGNOSTIC FINDINGS:  None in chart.  See above  PATIENT SURVEYS:  FOTO Risk adjusted 40 Primary measure: 58 Goal 59 SCREENING FOR RED FLAGS: None  COGNITION:  Overall cognitive status: Within functional limits for tasks assessed     SENSATION: Peripheral neuropathy, right foot tingling, numb x>5. (As per pt report)   POSTURE: rounded shoulders and increased lumbar lordosis  PALPATION: No tenderness to palpation about LB or hip/buttock  LUMBAR ROM:   Active  A/PROM  eval  Flexion 50% limited by pain  Extension 25%limited by pain  Right lateral flexion wfl  Left lateral flexion wfl  Right rotation wfl  Left rotation wfl   (Blank rows = not tested)  LOWER EXTREMITY ROM:     Active  Right eval Left eval  Hip flexion    Hip extension    Hip abduction    Hip adduction    Hip internal rotation    Hip external rotation    Knee flexion 110 105  Knee extension wfl Wfl   Ankle dorsiflexion    Ankle plantarflexion    Ankle inversion    Ankle eversion     (Blank rows = not tested)  LOWER EXTREMITY MMT:    MMT Right eval Left eval  Hip flexion 35.0 27.0  Hip extension    Hip abduction 26.4 39.8  Hip adduction    Hip internal rotation    Hip external rotation    Knee flexion 40.3 28.7  Knee extension    Ankle dorsiflexion    Ankle plantarflexion    Ankle  inversion    Ankle eversion     (Blank rows = not tested)  LUMBAR SPECIAL TESTS:  Slump test: Negative and Trendelenburg sign: Negative  FUNCTIONAL TESTS:  5 times sit to stand: 22 limited by OA knees> LBP Timed up and go (TUG): 15  GAIT: Distance walked: 500 ft Assistive device utilized: None Level of assistance: Complete Independence Comments: Slight antalgic limp lle, decreased cadence.    TODAY'S TREATMENT  Pt seen for aquatic therapy today.  Treatment took place in water 3.25-4.5 ft in depth at the [facility]. Temp of water was 91.  Pt entered/exited the pool via stairs independently with bilat rail.  * forward gait; backward gait; side stepping with arm abdct/ add  * monster walk forward * forward lunge on 2nd step for knee flexion;  quad stretch with foot on 2nd step behind her * hamstring stretch with foot on 3rd step * Straddling yellow noodle and holding wall in corner- cycling forward-  hip abdct/ add feet suspended * Ab set with thin square noodle pull down (too easy); switched to yellow noodle x 3s x 10 * Warrior 1 with noodle raise towards ceiling x 5 each leg forward * plank on bench with hip ext and core engaged * quad stretch with foot on bench * sit to/from stand from bench x 5 * hamstring stretch with foot on 2nd step    Pt requires the buoyancy and hydrostatic pressure of water for support, and to offload joints by unweighting joint load by at least 50 % in navel deep water and by at least 75-80% in chest to neck deep water.  Viscosity of the water is needed for resistance of strengthening. Water current perturbations provides challenge to standing balance requiring increased core activation.     PATIENT EDUCATION:  Education details: Statistician of condition/ properties of water/ benefit of aquatic therapy Person educated: Patient Education method: Explanation Education comprehension: verbalized understanding   HOME  EXERCISE PROGRAM: Aquatic HEP to be assigned going forward  ASSESSMENT:  CLINICAL IMPRESSION: Improved exercise tolerance compared to last week.  She tolerated planks with hip ext well; will add that to her HEP.  Slight reduction in pain while pt was exercising in water.  Pt is progressing gradually towards goals.   OBJECTIVE IMPAIRMENTS decreased activity tolerance, decreased ROM, decreased strength, increased edema, impaired sensation, obesity, and pain.   ACTIVITY LIMITATIONS lifting, bending, squatting, sleeping, stairs, transfers, and bed mobility  PARTICIPATION LIMITATIONS: cleaning, community activity, and sit to stand transfers from chairs, car, bed  [place], Past/current experiences, and 1-2 comorbidities: morbid obesity, OA knees, macromastia  are also affecting patient's functional outcome.   REHAB POTENTIAL: Good  CLINICAL DECISION MAKING: Evolving/moderate complexity  EVALUATION COMPLEXITY: Moderate   GOALS: Goals reviewed with patient? Yes  SHORT TERM GOALS: Target date: 08/22/21  Tug under 12 sec to demonstrate improved strength and balance Baseline:15s Goal status: INITIAL  2.  Improved lumbar flex and ext ROM by 25% without increase in pain Baseline: limited by pain  Goal status: INITIAL  3.  Hip flex strength to be improved by 5 lbs Baseline: 35 right; 27 left Goal status: INITIAL  4.  LBP max pain to be decreased by at least 2 points Baseline: 8/10 Goal status: INITIAL    LONG TERM GOALS: Target date: 09/12/21  Pt will tolerating sleeping in bed part of night to improve rest Baseline: unable to tolerate Goal status: INITIAL  2.  Pt to be indep with final aquatic HEP Baseline: none Goal status: INITIAL  3.  Pt to meet stated FOTO goal Baseline: risk adjusted 40 Goal status: INITIAL  4. Pt will improve bilat knee strength and ROM decreasing knee pain to below 7/10 to better manage OA symptoms  Baseline: pain 9.5/10  Goal  Status: INITIAL    PLAN: PT FREQUENCY: 1-2x/week  PT DURATION: 6 weeks  PLANNED INTERVENTIONS: Therapeutic exercises, Therapeutic activity, Neuromuscular re-education, Balance training, Gait training, Patient/Family education, Self Care, Joint mobilization, Joint manipulation, Stair training, Aquatic Therapy, Electrical stimulation, Spinal manipulation, Spinal mobilization, Cryotherapy, Moist heat, Taping, Traction, Ultrasound, Ionotophoresis '4mg'$ /ml Dexamethasone, and Manual therapy.  PLAN FOR NEXT SESSION: core stretching/strengthening.  Knee strengthening; postural alignment. Aerobic capacity training.   Kerin Perna, PTA 08/28/21 10:18 AM

## 2021-08-30 ENCOUNTER — Encounter (HOSPITAL_BASED_OUTPATIENT_CLINIC_OR_DEPARTMENT_OTHER): Payer: Self-pay | Admitting: Physical Therapy

## 2021-08-30 ENCOUNTER — Ambulatory Visit (HOSPITAL_BASED_OUTPATIENT_CLINIC_OR_DEPARTMENT_OTHER): Payer: BC Managed Care – PPO | Admitting: Physical Therapy

## 2021-08-30 DIAGNOSIS — M6281 Muscle weakness (generalized): Secondary | ICD-10-CM | POA: Diagnosis not present

## 2021-08-30 DIAGNOSIS — M25561 Pain in right knee: Secondary | ICD-10-CM | POA: Diagnosis not present

## 2021-08-30 DIAGNOSIS — M48061 Spinal stenosis, lumbar region without neurogenic claudication: Secondary | ICD-10-CM | POA: Diagnosis not present

## 2021-08-30 DIAGNOSIS — G8929 Other chronic pain: Secondary | ICD-10-CM

## 2021-08-30 DIAGNOSIS — M25562 Pain in left knee: Secondary | ICD-10-CM | POA: Diagnosis not present

## 2021-08-30 NOTE — Therapy (Signed)
OUTPATIENT PHYSICAL THERAPY THORACOLUMBAR TREATMENT NOTE   Patient Name: Jamie [name] MRN: [id number] DOB:Feb 15, 1960, 61 y.o., female Today's Date: 08/30/2021   PT End of Session - 08/30/21 0801     Visit Number 8    Number of Visits 12    Date for PT Re-Evaluation 09/12/21    Authorization Type BC[employer]    PT Start Time 0731    PT Stop Time 0814    PT Time Calculation (min) 43 min    Activity Tolerance Patient tolerated treatment well    Behavior During Therapy [facility] for tasks assessed/performed               Past Medical History:  Diagnosis Date   Acquired dilation of ascending aorta and aortic root ([place])    95m by 2D echo 03/2019   Allergy    Aortic valve disease    functionally bicuspid AV with fusion of the right and left coronary cusps with no AS by echo 03/2020   Arthritis    in back   Chest pain    neg cath 2016 after false positive myoview   Depression    GERD (gastroesophageal reflux disease)    Hyperglycemia    Hyperlipidemia    Hypertension    Joint pain    Knee pain    Low back pain    Migraine    Mitral valve prolapse    Muscle cramps    Prediabetes    [place]ulmonary HTN ([place]    mild with PASP 33mg by echo 03/2019 but 2624m on echo 03/2020   Past Surgical History:  Procedure Laterality Date   BACK SURGERY     CESAREAN SECTION     LEFT HEART CATHETERIZATION WITH CORONARY ANGIOGRAM N/A 07/08/2013   Procedure: LEFT HEART CATHETERIZATION WITH CORONARY ANGIOGRAM;  Surgeon: PetJosue HectorD;  [facility];  Service: Cardiovascular;  Laterality: N/A;   TUBAL LIGATION     Patient Active Problem List   Diagnosis Date Noted   Symptomatic mammary hypertrophy 07/14/2021   Neck pain 07/14/2021   Aortic valve disease    Abnormal perimenopausal bleeding 12/30/2019   Arthritis 12/30/2019   Menorrhagia 12/30/2019   Mitral valve prolapse 12/30/2019   Uterine leiomyoma 12/30/2019   Vulvitis 12/30/2019   Restless legs 12/30/2019   Congestive  heart failure ([place]CC[place]4/13/2021   Cyst of right breast 04/14/2019   Hyperglycemia 04/14/2019   Acquired dilation of ascending aorta and aortic root (HCC)    Pulmonary HTN (HCC)    Chronic diastolic CHF (congestive heart failure) ([place]CC[place]8/27/2020   DM (diabetes mellitus), type 2 ([place]CC[place]8/13/2020   Genital herpes 04/16/2018   Chronic back pain 04/16/2018   Syncope 10/06/2017   Other fatigue 10/01/2017   Shortness of breath on exertion 10/01/2017   Acute low back pain 02/04/2017   Degeneration of lumbar intervertebral disc 02/04/2017   Degenerative lumbar spinal stenosis 02/04/2017   Herpes simplex type 2 infection 06/20/2016   Hyperlipemia 05/06/2015   GERD (gastroesophageal reflux disease) 08/01/2010   Hypertension 01/20/2007   Morbid obesity (HCCBl[place]9/04/2006   Migraine headache 09/05/2006    PCP: HerIsaac BlissstRayford HalstedD  REFERRING PROVIDER: DahMelina Schools  REFERRING DIAG: [phone]) - Spinal stenosis, lumbar region without neurogenic claudication  Rationale for Evaluation and Treatment Rehabilitation  THERAPY DIAG:  Spinal stenosis of lumbar region without neurogenic claudication  Muscle weakness (generalized)  Chronic pain of left knee  Chronic pain of right knee  ONSET  DATE: 6 months  SUBJECTIVE:                                                                                                                                                                                           SUBJECTIVE STATEMENT: "My right knee popped last night and it feels a lot better".  No other changes since last visit.    PERTINENT HISTORY:  Thoracic pain, related to Macromastia  Elevated BMI 10 years status post a right L5-S1 discectomy End-stage bilateral knee osteoarthritis, tricompartmental, varus deformity.  Bilateral pes anserine bursitis.  Chronic low back pain.   PAIN:  Are you having pain? Yes: NPRS scale: 5/10 bilat lower back and bilat knees Pain  location: see above Pain description: nagging, dull, sometimes sharp Aggravating factors: sitting to standing, cold weather Relieving factors: sitting, meds   PRECAUTIONS: Back  WEIGHT BEARING RESTRICTIONS No  FALLS:  Has patient fallen in last 6 months? Yes. 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   OBJECTIVE:   DIAGNOSTIC FINDINGS:  None in chart.  See above  PATIENT SURVEYS:  FOTO Risk adjusted 40 Primary measure: 58 Goal 59 SCREENING FOR RED FLAGS: None  COGNITION:  Overall cognitive status: Within functional limits for tasks assessed     SENSATION: Peripheral neuropathy, right foot tingling, numb x>5. (As per pt report)   POSTURE: rounded shoulders and increased lumbar lordosis  PALPATION: No tenderness to palpation about LB or hip/buttock  LUMBAR ROM:   Active  A/PROM  eval   Flexion 50% limited by pain   Extension 25%limited by pain   Right lateral flexion wfl   Left lateral flexion wfl   Right rotation wfl   Left rotation wfl    (Blank rows = not tested)  LOWER EXTREMITY ROM:     Active  Right eval Left eval  Hip flexion    Hip extension    Hip abduction    Hip adduction    Hip internal rotation    Hip external rotation    Knee flexion 110 105  Knee extension wfl Wfl   Ankle dorsiflexion    Ankle plantarflexion    Ankle inversion    Ankle eversion     (Blank rows = not tested)  LOWER EXTREMITY MMT:    MMT Right eval Left eval    Hip flexion 35.0 27.0    Hip extension  Hip abduction 26.4 39.8    Hip adduction      Hip internal rotation      Hip external rotation      Knee flexion 40.3 28.7    Knee extension      Ankle dorsiflexion      Ankle plantarflexion       Ankle inversion      Ankle eversion       (Blank rows = not tested)  LUMBAR SPECIAL TESTS:  Slump test: Negative and Trendelenburg sign: Negative  FUNCTIONAL TESTS:  5 times sit to stand: 22 limited by OA knees> LBP Timed up and go (TUG): 15  08/30/21: TUG 9.79 sec  GAIT: Distance walked: 500 ft Assistive device utilized: None Level of assistance: Complete Independence Comments: Slight antalgic limp lle, decreased cadence.    TODAY'S TREATMENT  Pt seen for aquatic therapy today.  Treatment took place in water 3.25-4.5 ft in depth at the [place]. Temp of water was 91.  Pt entered/exited the pool via stairs independently with bilat rail.  * forward gait; backward gait; side stepping with arm abdct/ add  * monster walk forward * forward lunge on 2nd step for knee flexion;   hamstring stretch with foot on 3rd step; quad stretch with foot on 2nd step behind her * plank on bench with hip ext and core engaged, slow mountain climbers * Straddling yellow noodle and holding wall in corner- cycling forward-  hip abdct/ add feet suspended; cc ski * holding yellow hand buoys- 3 way toe tap, then 3 way SLR   * walking with rainbow hand buoys at side forward / backward and side stepping with arm abdct / add  * Warrior 1 with noodle raise towards ceiling x 3 each leg forward (challenging)  Pt requires the buoyancy and hydrostatic pressure of water for support, and to offload joints by unweighting joint load by at least 50 % in navel deep water and by at least 75-80% in chest to neck deep water.  Viscosity of the water is needed for resistance of strengthening. Water current perturbations provides challenge to standing balance requiring increased core activation.  PATIENT EDUCATION:  Education details: aquatics progression, issued laminated HEP Person educated: Patient Education method: Explanation Education comprehension: verbalized understanding   HOME EXERCISE  PROGRAM: Access Code: [place] URL: [email or web address] Date: 08/30/2021 Prepared by: [place]  Exercises - Forward Walking  - 1 x daily - 7 x weekly - 3 sets - Side Stepping  - 1 x daily - 7 x weekly - 3 sets - Backward Walking  - 1 x daily - 7 x weekly - 3 sets - Hip Flexor Stretch with Chair  - 1 x daily - 7 x weekly - 2-3 reps - 10-20 seconds  hold - Standing Hamstring Stretch on Chair  - 1 x daily - 7 x weekly - 2-3 reps - 10-20 seconds  hold - Quadricep Stretch with Chair and Counter Support  - 1 x daily - 7 x weekly - 2-3 reps - 10-20 seconds hold - Plank with Hip Extension at [employer]  - 1 x daily - 7 x weekly - 1 sets - 5-10 reps - Mountain Climbers Slow with Hands on Step  - 1 x daily - 7 x weekly - 1 sets - 5-10 reps - Forward Monster Walk  - 1 x daily - 7 x weekly - 3 sets - Standing 3-Way Leg Reach with Ankle Floats  -  1 x daily - 7 x weekly - 1 sets - 10 reps - Seated Straddle on Flotation Forward Breast Stroke Arms and Bicycle Legs  - 1 x daily - 7 x weekly - 3 sets - 10 reps - [place] Ski with Foam Dumbbells and Ankle Floats  - 1 x daily - 7 x weekly - 1 sets - 10 reps - Warrior I in [employer] with [employer]  - 1 x daily - 7 x weekly - 5 reps  ASSESSMENT:  CLINICAL IMPRESSION: Improved TUG time to 9.79 seconds.   She tolerated planks with mountain climbers as well 3 way leg kick.  Slight reduction in pain while pt was exercising in water.  Pt is progressing gradually towards remaining goals.   OBJECTIVE IMPAIRMENTS decreased activity tolerance, decreased ROM, decreased strength, increased edema, impaired sensation, obesity, and pain.   ACTIVITY LIMITATIONS lifting, bending, squatting, sleeping, stairs, transfers, and bed mobility  PARTICIPATION LIMITATIONS: cleaning, community activity, and sit to stand transfers from chairs, car, bed  [place], Past/current experiences, and 1-2  comorbidities: morbid obesity, OA knees, macromastia  are also affecting patient's functional outcome.   REHAB POTENTIAL: Good  CLINICAL DECISION MAKING: Evolving/moderate complexity  EVALUATION COMPLEXITY: Moderate   GOALS: Goals reviewed with patient? Yes  SHORT TERM GOALS: Target date: 08/22/21  Tug under 12 sec to demonstrate improved strength and balance Baseline:15s Goal status: Achieved 08/30/21 (see above)  2.  Improved lumbar flex and ext ROM by 25% without increase in pain Baseline: limited by pain  Goal status:Ongoing   3.  Hip flex strength to be improved by 5 lbs Baseline: 35 right; 27 left Goal status: Ongoing  4.  LBP max pain to be decreased by at least 2 points Baseline: 8/10 at eval, 6/10  Goal status: Achieved - 08/30/21    LONG TERM GOALS: Target date: 09/12/21  Pt will tolerating sleeping in bed part of night to improve rest Baseline: unable to tolerate Goal status: INITIAL  2.  Pt to be indep with final aquatic HEP Baseline: none Goal status: INITIAL  3.  Pt to meet stated FOTO goal Baseline: risk adjusted 40 Goal status: INITIAL  4. Pt will improve bilat knee strength and ROM decreasing knee pain to below 7/10 to better manage OA symptoms  Baseline: pain 9.5/10  Goal Status: INITIAL    PLAN: PT FREQUENCY: 1-2x/week  PT DURATION: 6 weeks  PLANNED INTERVENTIONS: Therapeutic exercises, Therapeutic activity, Neuromuscular re-education, Balance training, Gait training, Patient/Family education, Self Care, Joint mobilization, Joint manipulation, Stair training, Aquatic Therapy, Electrical stimulation, Spinal manipulation, Spinal mobilization, Cryotherapy, Moist heat, Taping, Traction, Ultrasound, Ionotophoresis '4mg'$ /ml Dexamethasone, and Manual therapy.  PLAN FOR NEXT SESSION: core stretching/strengthening.  Knee strengthening; postural alignment. Aerobic capacity training.   Test 5x STS and MMT.  Assess goals and need for additional visits.    Kerin Perna, PTA 08/30/21 8:26 AM

## 2021-09-04 ENCOUNTER — Other Ambulatory Visit: Payer: Self-pay | Admitting: Cardiology

## 2021-09-05 NOTE — Progress Notes (Unsigned)
   Referring Provider Isaac Bliss, Rayford Halsted, MD [place],  [place] [id number]   CC: No chief complaint on file.     Jamie [name] is an 61 y.o. female.  HPI: Patient is a 61 y.o. year old female here for follow up after completing physical therapy for pain related to macromastia.  She saw Dr. Marla Roe for evaluation for macromastia on 07/14/2021.  She has completed over 6 visits of physical therapy starting on 08/01/2021 and her last appointment was on 08/30/2021.    Review of Systems General: ***  Physical Exam    07/14/2021    8:40 AM 06/14/2021    6:15 PM 06/14/2021    6:10 PM  Vitals with BMI  Height 5' 10.5"    Weight 296 lbs 6 oz    BMI 86.76    Systolic 720 947 096  Diastolic 283 84 84  Pulse 64 60     General:  No acute distress,  Alert and oriented, Non-Toxic, Normal speech and affect Psych: Normal behavior and mood ***   Assessment/Plan ***  Patient is interested in pursuing surgical intervention for bilateral breast reduction. Patient has completed at least 6 weeks of physical therapy for pain related to macromastia.  Discussed with patient we would submit to insurance for authorization, discussed approval could take up to 6 weeks.   Jamie [name] 09/05/2021, 2:31 PM

## 2021-09-06 ENCOUNTER — Ambulatory Visit (INDEPENDENT_AMBULATORY_CARE_PROVIDER_SITE_OTHER): Payer: BC Managed Care – PPO | Admitting: Internal Medicine

## 2021-09-06 ENCOUNTER — Ambulatory Visit: Payer: BC Managed Care – PPO | Admitting: Student

## 2021-09-06 ENCOUNTER — Encounter: Payer: Self-pay | Admitting: Internal Medicine

## 2021-09-06 ENCOUNTER — Ambulatory Visit (INDEPENDENT_AMBULATORY_CARE_PROVIDER_SITE_OTHER): Payer: BC Managed Care – PPO | Admitting: Surgical

## 2021-09-06 ENCOUNTER — Encounter: Payer: Self-pay | Admitting: Surgical

## 2021-09-06 VITALS — Wt 286.4 lb

## 2021-09-06 VITALS — BP 150/96 | HR 67 | Temp 98.4°F | Wt 287.3 lb

## 2021-09-06 DIAGNOSIS — E7800 Pure hypercholesterolemia, unspecified: Secondary | ICD-10-CM

## 2021-09-06 DIAGNOSIS — Z23 Encounter for immunization: Secondary | ICD-10-CM | POA: Diagnosis not present

## 2021-09-06 DIAGNOSIS — M542 Cervicalgia: Secondary | ICD-10-CM

## 2021-09-06 DIAGNOSIS — G8929 Other chronic pain: Secondary | ICD-10-CM

## 2021-09-06 DIAGNOSIS — E785 Hyperlipidemia, unspecified: Secondary | ICD-10-CM

## 2021-09-06 DIAGNOSIS — I10 Essential (primary) hypertension: Secondary | ICD-10-CM | POA: Diagnosis not present

## 2021-09-06 DIAGNOSIS — E1169 Type 2 diabetes mellitus with other specified complication: Secondary | ICD-10-CM

## 2021-09-06 DIAGNOSIS — N62 Hypertrophy of breast: Secondary | ICD-10-CM

## 2021-09-06 DIAGNOSIS — I5032 Chronic diastolic (congestive) heart failure: Secondary | ICD-10-CM

## 2021-09-06 DIAGNOSIS — M545 Low back pain, unspecified: Secondary | ICD-10-CM | POA: Diagnosis not present

## 2021-09-06 DIAGNOSIS — M546 Pain in thoracic spine: Secondary | ICD-10-CM | POA: Diagnosis not present

## 2021-09-06 DIAGNOSIS — R252 Cramp and spasm: Secondary | ICD-10-CM

## 2021-09-06 LAB — POCT GLYCOSYLATED HEMOGLOBIN (HGB A1C): Hemoglobin A1C: 5.6 % (ref 4.0–5.6)

## 2021-09-06 LAB — COMPREHENSIVE METABOLIC PANEL
ALT: 16 U/L (ref 0–35)
AST: 14 U/L (ref 0–37)
Albumin: 4.1 g/dL (ref 3.5–5.2)
Alkaline Phosphatase: 56 U/L (ref 39–117)
BUN: 9 mg/dL (ref 6–23)
CO2: 29 mEq/L (ref 19–32)
Calcium: 9.7 mg/dL (ref 8.4–10.5)
Chloride: 106 mEq/L (ref 96–112)
Creatinine, Ser: 0.86 mg/dL (ref 0.40–1.20)
GFR: 73.81 mL/min (ref >60.00)
Glucose, Bld: 100 mg/dL — ABNORMAL HIGH (ref 70–99)
Potassium: 3.8 mEq/L (ref 3.5–5.1)
Sodium: 141 mEq/L (ref 135–145)
Total Bilirubin: 0.5 mg/dL (ref 0.2–1.2)
Total Protein: 7.2 g/dL (ref 6.0–8.3)

## 2021-09-06 LAB — MAGNESIUM: Magnesium: 1.8 mg/dL (ref 1.5–2.5)

## 2021-09-06 MED ORDER — LOSARTAN POTASSIUM 100 MG PO TABS: 100.0000 mg | ORAL_TABLET | Freq: Every day | ORAL | 1 refills | Status: DC

## 2021-09-06 MED ORDER — ATORVASTATIN CALCIUM 80 MG PO TABS: 80.0000 mg | ORAL_TABLET | Freq: Every day | ORAL | 1 refills | Status: DC

## 2021-09-06 MED ORDER — HYDRALAZINE HCL 100 MG PO TABS: 100.0000 mg | ORAL_TABLET | Freq: Three times a day (TID) | ORAL | 1 refills | Status: DC

## 2021-09-06 MED ORDER — CARVEDILOL 12.5 MG PO TABS: 12.5000 mg | ORAL_TABLET | Freq: Two times a day (BID) | ORAL | 1 refills | Status: DC

## 2021-09-06 NOTE — Patient Instructions (Signed)
-  Nice seeing you today!!  -Lab work today; will notify you once results are available.  -Look at new medicine list and confirm you are taking the correct medications.  -Flu vaccine today.  -Schedule follow up in 3 months.  -Check BP at home and bring measurements into next visit.

## 2021-09-06 NOTE — Addendum Note (Signed)
Addended by: Westley Hummer B on: 09/06/2021 07:44 AM   Modules accepted: Orders

## 2021-09-06 NOTE — Addendum Note (Signed)
Addended by: Octavio Manns E on: 09/06/2021 07:39 AM   Modules accepted: Orders

## 2021-09-06 NOTE — Progress Notes (Signed)
Established Patient Office Visit     CC/Reason for Visit: 50-monthfollow-up chronic conditions  HPI: LTu Jamie [name] a 61y.o. female who is coming in today for the above mentioned reasons. Past Medical History is significant for: Hypertension, hyperlipidemia, obstructive sleep apnea, heart failure with preserved ejection fraction, morbid obesity.  At last visit her impaired glucose tolerance had progressed to diabetes with an A1c of 6.7.  She was prescribed Jardiance however her insurance did not cover.  She has been complaining of leg cramps.  It does not appear as if she is taking her medication correctly.   Past Medical/Surgical History: Past Medical History:  Diagnosis Date   Acquired dilation of ascending aorta and aortic root ([place]    351mby 2D echo 03/2019   Allergy    Aortic valve disease    functionally bicuspid AV with fusion of the right and left coronary cusps with no AS by echo 03/2020   Arthritis    in back   Chest pain    neg cath 2016 after false positive myoview   Depression    GERD (gastroesophageal reflux disease)    Hyperglycemia    Hyperlipidemia    Hypertension    Joint pain    Knee pain    Low back pain    Migraine    Mitral valve prolapse    Muscle cramps    Prediabetes    Pulmonary HTN ([place]   mild with PASP 3936m by echo 03/2019 but 66m3mon echo 03/2020    Past Surgical History:  Procedure Laterality Date   BACK SURGERY     CESAREAN SECTION     LEFT HEART CATHETERIZATION WITH CORONARY ANGIOGRAM N/A 07/08/2013   Procedure: LEFT HEART CATHETERIZATION WITH CORONARY ANGIOGRAM;  Surgeon: PeteJosue Hector;  [facility];  Service: Cardiovascular;  Laterality: N/A;   TUBAL LIGATION      Social History:  reports that she quit smoking about 25 years ago. Her smoking use included cigarettes. She has been exposed to tobacco smoke. She has never used smokeless tobacco. She reports current alcohol use. She reports that she does not  use drugs.  Allergies: No Known Allergies  Family History:  Family History  Problem Relation Age of Onset   Hyperlipidemia Mother    Hypertension Mother    AAA (abdominal aortic aneurysm) Mother    Stroke Father    Hypertension Father    Hyperlipidemia Brother        x2   Hypertension Brother        x2   Liver cancer Maternal Aunt    Breast cancer Cousin    Colon cancer Cousin    Colon polyps Neg Hx    Esophageal cancer Neg Hx    Stomach cancer Neg Hx    Rectal cancer Neg Hx      Current Outpatient Medications:    meloxicam (MOBIC) 7.5 MG tablet, Take 1 tablet (7.5 mg total) by mouth daily., Disp: 90 tablet, Rfl: 0   atorvastatin (LIPITOR) 80 MG tablet, Take 1 tablet (80 mg total) by mouth daily., Disp: 90 tablet, Rfl: 1   carvedilol (COREG) 12.5 MG tablet, Take 1 tablet (12.5 mg total) by mouth 2 (two) times daily with a meal., Disp: 180 tablet, Rfl: 1   hydrALAZINE (APRESOLINE) 100 MG tablet, Take 1 tablet (100 mg total) by mouth 3 (three) times daily., Disp: 270 tablet, Rfl: 1   losartan (COZAAR) 100 MG  tablet, Take 1 tablet (100 mg total) by mouth daily., Disp: 90 tablet, Rfl: 1  Review of Systems:  Constitutional: Denies fever, chills, diaphoresis, appetite change and fatigue.  HEENT: Denies photophobia, eye pain, redness, hearing loss, ear pain, congestion, sore throat, rhinorrhea, sneezing, mouth sores, trouble swallowing, neck pain, neck stiffness and tinnitus.   Respiratory: Denies SOB, DOE, cough, chest tightness,  and wheezing.   Cardiovascular: Denies chest pain, palpitations and leg swelling.  Gastrointestinal: Denies nausea, vomiting, abdominal pain, diarrhea, constipation, blood in stool and abdominal distention.  Genitourinary: Denies dysuria, urgency, frequency, hematuria, flank pain and difficulty urinating.  Endocrine: Denies: hot or cold intolerance, sweats, changes in hair or nails, polyuria, polydipsia. Musculoskeletal: Denies myalgias, back pain, joint  swelling, arthralgias and gait problem.  Skin: Denies pallor, rash and wound.  Neurological: Denies dizziness, seizures, syncope, weakness, light-headedness, numbness and headaches.  Hematological: Denies adenopathy. Easy bruising, personal or family bleeding history  Psychiatric/Behavioral: Denies suicidal ideation, mood changes, confusion, nervousness, sleep disturbance and agitation    Physical Exam: Vitals:   09/06/21 0704 09/06/21 0707  BP: (!) 140/98 (!) 150/96  Pulse: 67   Temp: 98.4 F (36.9 C)   TempSrc: Oral   SpO2: 98%   Weight: 287 lb 4.8 oz (130.3 kg)     Body mass index is 40.64 kg/m.   Constitutional: NAD, calm, comfortable, obese Eyes: PERRL, lids and conjunctivae normal ENMT: Mucous membranes are moist.  Respiratory: clear to auscultation bilaterally, no wheezing, no crackles. Normal respiratory effort. No accessory muscle use.  Cardiovascular: Regular rate and rhythm, no murmurs / rubs / gallops. No extremity edema. No carotid bruits.   Psychiatric: Normal judgment and insight. Alert and oriented x 3. Normal mood.    Impression and Plan:  Type 2 diabetes mellitus with other specified complication, without long-term current use of insulin ([place]) - Plan: POCT glycosylated hemoglobin (Hb A1C), Urine microalbumin-creatinine with uACR, Ambulatory referral to Ophthalmology  Pure hypercholesterolemia  Primary hypertension  Morbid obesity ([place])  Chronic diastolic CHF (congestive heart failure) (HCC)  Leg cramps - Plan: Comprehensive metabolic panel, Magnesium, Magnesium, Comprehensive metabolic panel  Hyperlipidemia, unspecified hyperlipidemia type - Plan: atorvastatin (LIPITOR) 80 MG tablet  Chronic diastolic (congestive) heart failure (HCC) - Plan: losartan (COZAAR) 100 MG tablet  Essential (primary) hypertension - Plan: carvedilol (COREG) 12.5 MG tablet, hydrALAZINE (APRESOLINE) 100 MG tablet, losartan (COZAAR) 100 MG tablet  Need for influenza  vaccination  -A1c demonstrates good control at 5.6, she has not been taking Jardiance.  She has lost approximately 10 pounds.  Okay to monitor for now without medications, follow-up in 3 months.  Check microalbumin, place referral for diabetic eye exam. -Blood pressure remains elevated.  We have gone over medication list in great detail.  She will continue hydralazine 3 times a day carvedilol twice a day as well as losartan and will follow-up with me in 3 months with ambulatory measurements.  I have cleaned up her med list. -She will resume atorvastatin that she is is supposed to be on for hyperlipidemia. -I wonder about electrolyte abnormalities with her leg cramps.  Check CMP and magnesium. -Flu vaccine administered today.  Time spent:33 minutes reviewing chart, interviewing and examining patient and formulating plan of care.   Patient Instructions  -Nice seeing you today!!  -Lab work today; will notify you once results are available.  -Look at new medicine list and confirm you are taking the correct medications.  -Flu vaccine today.  -Schedule follow up in 3 months.  -  Check BP at home and bring measurements into next visit.   Lelon Frohlich, MD [facility]

## 2021-09-07 ENCOUNTER — Encounter: Payer: Self-pay | Admitting: Internal Medicine

## 2021-09-08 ENCOUNTER — Encounter (HOSPITAL_BASED_OUTPATIENT_CLINIC_OR_DEPARTMENT_OTHER): Payer: Self-pay | Admitting: Physical Therapy

## 2021-09-08 ENCOUNTER — Ambulatory Visit (HOSPITAL_BASED_OUTPATIENT_CLINIC_OR_DEPARTMENT_OTHER): Payer: BC Managed Care – PPO | Attending: Orthopedic Surgery | Admitting: Physical Therapy

## 2021-09-08 DIAGNOSIS — G8929 Other chronic pain: Secondary | ICD-10-CM

## 2021-09-08 DIAGNOSIS — M25562 Pain in left knee: Secondary | ICD-10-CM | POA: Insufficient documentation

## 2021-09-08 DIAGNOSIS — M25561 Pain in right knee: Secondary | ICD-10-CM | POA: Diagnosis present

## 2021-09-08 DIAGNOSIS — M6281 Muscle weakness (generalized): Secondary | ICD-10-CM | POA: Diagnosis present

## 2021-09-08 DIAGNOSIS — M48061 Spinal stenosis, lumbar region without neurogenic claudication: Secondary | ICD-10-CM

## 2021-09-08 NOTE — Therapy (Addendum)
OUTPATIENT PHYSICAL THERAPY THORACOLUMBAR TREATMENT NOTE PHYSICAL THERAPY DISCHARGE SUMMARY  Visits from Start of Care: 9  Current functional level related to goals / functional outcomes: She is safe and indep with adl's and functional mobility with AD   Remaining deficits: pain   Education / Equipment: Management of condition/ HEP    Patient agrees to discharge. Patient goals were partially met. Patient is being discharged due to the patient's request.   Patient Name: Jamie [name] MRN: [id number] DOB:Nov 12, 1960, 61 y.o., female Today's Date: 09/08/2021   PT End of Session - 09/08/21 0816     Visit Number 9    Number of Visits 12    Date for PT Re-Evaluation 09/12/21    Authorization Type BC[employer]    PT Start Time 0815    PT Stop Time 0855    PT Time Calculation (min) 40 min    Activity Tolerance Patient tolerated treatment well    Behavior During Therapy [facility] for tasks assessed/performed               Past Medical History:  Diagnosis Date   Acquired dilation of ascending aorta and aortic root ([place])    12m by 2D echo 03/2019   Allergy    Aortic valve disease    functionally bicuspid AV with fusion of the right and left coronary cusps with no AS by echo 03/2020   Arthritis    in back   Chest pain    neg cath 2016 after false positive myoview   Depression    GERD (gastroesophageal reflux disease)    Hyperglycemia    Hyperlipidemia    Hypertension    Joint pain    Knee pain    Low back pain    Migraine    Mitral valve prolapse    Muscle cramps    Prediabetes    Pulmonary HTN ([place]    mild with PASP 388mg by echo 03/2019 but 2687m on echo 03/2020   Past Surgical History:  Procedure Laterality Date   BACK SURGERY     CESAREAN SECTION     LEFT HEART CATHETERIZATION WITH CORONARY ANGIOGRAM N/A 07/08/2013   Procedure: LEFT HEART CATHETERIZATION WITH CORONARY ANGIOGRAM;  Surgeon: PetJosue HectorD;  [facility];  Service: Cardiovascular;   Laterality: N/A;   TUBAL LIGATION     Patient Active Problem List   Diagnosis Date Noted   Symptomatic mammary hypertrophy 07/14/2021   Neck pain 07/14/2021   Aortic valve disease    Abnormal perimenopausal bleeding 12/30/2019   Arthritis 12/30/2019   Menorrhagia 12/30/2019   Mitral valve prolapse 12/30/2019   Uterine leiomyoma 12/30/2019   Vulvitis 12/30/2019   Restless legs 12/30/2019   Congestive heart failure ([place]4/13/2021   Cyst of right breast 04/14/2019   Hyperglycemia 04/14/2019   Acquired dilation of ascending aorta and aortic root (HCC)    Pulmonary HTN (HCC)    Chronic diastolic CHF (congestive heart failure) ([place]CC[place]8/27/2020   DM (diabetes mellitus), type 2 ([place]CC[place]8/13/2020   Genital herpes 04/16/2018   Chronic back pain 04/16/2018   Syncope 10/06/2017   Other fatigue 10/01/2017   Shortness of breath on exertion 10/01/2017   Acute low back pain 02/04/2017   Degeneration of lumbar intervertebral disc 02/04/2017   Degenerative lumbar spinal stenosis 02/04/2017   Herpes simplex type 2 infection 06/20/2016   Hyperlipemia 05/06/2015   GERD (gastroesophageal reflux disease) 08/01/2010   Hypertension 01/20/2007   Morbid obesity ([place]CC[place]9/04/2006  Migraine headache 09/05/2006    PCP: Isaac Bliss, Rayford Halsted, MD  REFERRING PROVIDER: Melina Schools MD  REFERRING DIAG: [phone] (ICD-10-CM) - Spinal stenosis, lumbar region without neurogenic claudication  Rationale for Evaluation and Treatment Rehabilitation  THERAPY DIAG:  Spinal stenosis of lumbar region without neurogenic claudication  Muscle weakness (generalized)  Chronic pain of left knee  Chronic pain of right knee  ONSET DATE: 6 months  SUBJECTIVE:                                                                                                                                                                                           SUBJECTIVE STATEMENT: "I woke up with a crick in my back and  knee"   PERTINENT HISTORY:  Thoracic pain, related to Macromastia  Elevated BMI 10 years status post a right L5-S1 discectomy End-stage bilateral knee osteoarthritis, tricompartmental, varus deformity.  Bilateral pes anserine bursitis.  Chronic low back pain.   PAIN:  Are you having pain? Yes: NPRS scale: 9/10 bilat lower back and bilat knees Pain location: see above Pain description: nagging, dull, sometimes sharp Aggravating factors: sitting to standing, cold weather Relieving factors: sitting, meds   PRECAUTIONS: Back  WEIGHT BEARING RESTRICTIONS No  FALLS:  Has patient fallen in last 6 months? Yes. 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   OBJECTIVE:   DIAGNOSTIC FINDINGS:  None in chart.  See above  PATIENT SURVEYS:  FOTO Risk adjusted 40 Primary measure: 58 Goal 59 SCREENING FOR RED FLAGS: None  COGNITION:  Overall cognitive status: Within functional limits for tasks assessed     SENSATION: Peripheral neuropathy, right foot tingling, numb x>5. (As per pt report)   POSTURE: rounded shoulders and increased lumbar lordosis  PALPATION: No tenderness to palpation about LB or hip/buttock  LUMBAR ROM:   Active  A/PROM  eval   Flexion 50% limited by pain   Extension 25%limited by pain   Right lateral flexion wfl   Left lateral flexion wfl   Right rotation wfl   Left rotation wfl    (Blank rows = not tested)  LOWER EXTREMITY ROM:     Active  Right eval Left eval  Hip flexion    Hip extension    Hip abduction    Hip adduction    Hip internal rotation    Hip external rotation    Knee flexion 110  105  Knee extension wfl Wfl   Ankle dorsiflexion    Ankle plantarflexion    Ankle  inversion    Ankle eversion     (Blank rows = not tested)  LOWER EXTREMITY MMT:    MMT Right eval Left eval Right 9/8 Left  9/8  Hip flexion 35.0 27.0 35.2 36.8  Hip extension      Hip abduction 26.4 39.8 43.5 48.7  Hip adduction      Hip internal rotation      Hip external rotation      Knee flexion 40.3 28.7 43.5 35.7  Knee extension      Ankle dorsiflexion      Ankle plantarflexion      Ankle inversion      Ankle eversion       (Blank rows = not tested)  LUMBAR SPECIAL TESTS:  Slump test: Negative and Trendelenburg sign: Negative  FUNCTIONAL TESTS:  5 times sit to stand: 22 limited by OA knees> LBP Timed up and go (TUG): 15  08/30/21: TUG 9.79 sec  GAIT: Distance walked: 500 ft Assistive device utilized: None Level of assistance: Complete Independence Comments: Slight antalgic limp lle, decreased cadence.    TODAY'S TREATMENT  Pt seen for aquatic therapy today.  Treatment took place in water 3.25-4.5 ft in depth at the [place]. Temp of water was 92.  Pt entered/exited the pool via stairs independently with bilat rail.  * measurements taken to assess goals  * forward gait; backward gait; side stepping with arm abdct/ add with rainbow hand buoys * monster walk forward * Warrior 1 with noodle raise towards ceiling x 5 each leg forward  *forward lunge on 2nd step for knee flexion;   hamstring stretch with foot on 3rd step; quad stretch with foot on 2nd step behind her * plank on bench with hip ext and core engaged, slow mountain climbers * straddling yellow noodle, holding corner - cycling, cc ski, scissors   Pt requires the buoyancy and hydrostatic pressure of water for support, and to offload joints by unweighting joint load by at least 50 % in navel deep water and by at least 75-80% in chest to neck deep water.  Viscosity of the water is needed for resistance of strengthening. Water current perturbations provides challenge to standing balance  requiring increased core activation.  PATIENT EDUCATION:  Education details: aquatics progression, issued laminated HEP Person educated: Patient Education method: Explanation Education comprehension: verbalized understanding   HOME EXERCISE PROGRAM: Access Code: [place] URL: [email or web address] Date: 08/30/2021 Prepared by: Colbert  Exercises - Forward Walking  - 1 x daily - 7 x weekly - 3 sets - Side Stepping  - 1 x daily - 7 x weekly - 3 sets - Backward Walking  - 1 x daily - 7 x weekly - 3 sets - Hip Flexor Stretch with Chair  - 1 x daily - 7 x weekly - 2-3 reps - 10-20 seconds  hold - Standing Hamstring Stretch on Chair  - 1 x daily - 7 x weekly - 2-3 reps - 10-20 seconds  hold - Quadricep Stretch with Chair and Counter Support  - 1 x daily - 7 x weekly - 2-3 reps - 10-20 seconds hold - Plank with Hip Extension at [employer]  - 1 x daily - 7 x weekly - 1 sets - 5-10 reps - Mountain Climbers Slow with Hands on Step  - 1 x daily -  7 x weekly - 1 sets - 5-10 reps - Forward Monster Walk  - 1 x daily - 7 x weekly - 3 sets - Standing 3-Way Leg Reach with Ankle Floats  - 1 x daily - 7 x weekly - 1 sets - 10 reps - Seated Straddle on Flotation Forward Breast Stroke Arms and Bicycle Legs  - 1 x daily - 7 x weekly - 3 sets - 10 reps - [place] Ski with Foam Dumbbells and Ankle Floats  - 1 x daily - 7 x weekly - 1 sets - 10 reps - Warrior I in [employer] with [employer]  - 1 x daily - 7 x weekly - 5 reps  ASSESSMENT:  CLINICAL IMPRESSION: Pt demonstrated improved LE strength with MMT.  FOTO unchanged.  Pt has not attempted sleeping in her bed. Reviewed and finalized HEP.  Pt has partially met her goals and verbalized readiness to d/c to HEP.   OBJECTIVE IMPAIRMENTS decreased activity tolerance, decreased ROM, decreased strength, increased edema, impaired sensation, obesity, and pain.   ACTIVITY LIMITATIONS lifting,  bending, squatting, sleeping, stairs, transfers, and bed mobility  PARTICIPATION LIMITATIONS: cleaning, community activity, and sit to stand transfers from chairs, car, bed  [place], Past/current experiences, and 1-2 comorbidities: morbid obesity, OA knees, macromastia  are also affecting patient's functional outcome.   REHAB POTENTIAL: Good  CLINICAL DECISION MAKING: Evolving/moderate complexity  EVALUATION COMPLEXITY: Moderate   GOALS: Goals reviewed with patient? Yes  SHORT TERM GOALS: Target date: 08/22/21  Tug under 12 sec to demonstrate improved strength and balance Baseline:15s Goal status: Achieved 08/30/21 (see above)  2.  Improved lumbar flex and ext ROM by 25% without increase in pain Baseline: limited by pain  Goal status:Not Met   3.  Hip flex strength to be improved by 5 lbs Baseline:  Goal status: Partially Met   4.  LBP max pain to be decreased by at least 2 points Baseline: 8/10 at eval, 6/10  Goal status: Achieved - 08/30/21    LONG TERM GOALS: Target date: 09/12/21  Pt will tolerating sleeping in bed part of night to improve rest Baseline: unable to tolerate Goal status: Not Met 9/8  2.  Pt to be indep with final aquatic HEP Baseline: none Goal status: Achieved - 9/8  3.  Pt to meet stated FOTO goal Baseline:  unchanged from eval Goal status: Not met - 9/8  4. Pt will improve bilat knee strength and ROM decreasing knee pain to below 7/10 to better manage OA symptoms  Baseline: pain 9.5/10  Goal Status: partially met 9/8    PLAN: PT FREQUENCY: 1-2x/week  PT DURATION: 6 weeks  PLANNED INTERVENTIONS: Therapeutic exercises, Therapeutic activity, Neuromuscular re-education, Balance training, Gait training, Patient/Family education, Self Care, Joint mobilization, Joint manipulation, Stair training, Aquatic Therapy, Electrical stimulation, Spinal manipulation, Spinal mobilization, Cryotherapy, Moist heat, Taping, Traction,  Ultrasound, Ionotophoresis 42m/ml Dexamethasone, and Manual therapy.  PLAN FOR NEXT SESSION:  JKerin Perna PTA 09/08/21 8:59 AM  Addended MStanton Kidney(Tharon Aquas Ziemba MPT 11/02/21 338 PM

## 2021-09-13 ENCOUNTER — Other Ambulatory Visit: Payer: Self-pay | Admitting: Internal Medicine

## 2021-09-13 DIAGNOSIS — G4440 Drug-induced headache, not elsewhere classified, not intractable: Secondary | ICD-10-CM

## 2021-10-08 ENCOUNTER — Telehealth: Payer: Self-pay | Admitting: *Deleted

## 2021-10-08 NOTE — Telephone Encounter (Signed)
Auth submitted for CPT [place]: [id number] pending via [employer] (Primary)  [employer]: [id number] valid 10/16/21 - 01/14/22 (Secondary)

## 2021-11-03 ENCOUNTER — Other Ambulatory Visit: Payer: Self-pay | Admitting: Internal Medicine

## 2021-11-03 DIAGNOSIS — I5032 Chronic diastolic (congestive) heart failure: Secondary | ICD-10-CM

## 2021-11-03 DIAGNOSIS — I10 Essential (primary) hypertension: Secondary | ICD-10-CM

## 2021-11-21 DIAGNOSIS — G4733 Obstructive sleep apnea (adult) (pediatric): Secondary | ICD-10-CM | POA: Diagnosis not present

## 2021-11-22 NOTE — Progress Notes (Signed)
NEUROLOGY FOLLOW UP OFFICE NOTE  Jamie [name] [id number]  Assessment/Plan:   Chronic migraine without aura, with status migrainosus, intractable OSA Hypertension - ongoing problem.  Didn't take her BP meds this morning.   Start topiramate '25mg'$  at bedtime.  We can increase to '50mg'$  at bedtime in 4 weeks ijf needed. She would like to retry rizatriptan '10mg'$ .  Zofran '4mg'$  to take as abortive therapy for her typical migraines Limit use of pain relievers to no more than 2 days out of week to prevent risk of rebound or medication-overuse headache. Keep headache diary Continue use of CPAP Advised to take her BP medication and contact PCP Follow up in 4-5 months.     Subjective:  Jamie [name] is a 61 year old female with CHF, aortic valve disease, MVP, HTN, HLD, pulmonary HTN who follows up for migraines.  UPDATE: She started having headaches 3 months ago.  Still ongoing. Intensity:  mild to severe Duration:  all day Frequency:  daily (8-10 days severe) Frequency of abortive medication: Tylenol daily (but takes for other pain as well) Current NSAIDS/analgesics: Tylenol Current triptans:  rizatriptan '10mg'$  (ran out and hasn't taken it for awhile - can't remember if effective) Current ergotamine:  none Current anti-emetic:  Zofran '4mg'$  Current muscle relaxants:  none Current Antihypertensive medications:  metoprolol tartrate, carvedilol, furosemide, hydralazine,irbesartan-HCTZ, losartan Current Antidepressant medications:  none Current Anticonvulsant medications:  gabapentin 300-'600mg'$  PRN back pain) Current anti-CGRP:  none Current Vitamins/Herbal/Supplements:  Mg, melatonin, elderberry Current Antihistamines/Decongestants:  none Other therapy:  none Hormone/birth control:  none  Caffeine:  1 cup of coffee daily.  Sometimes soda and tea Diet:  Sometimes soda.  3 to 5 bottles water daily.  Skips meals Exercise:  was exercising but had to stop 6 weeks ago due to  injury Depression:  no; Anxiety:  no Other pain:  back and knee pain Sleep hygiene:  Has OSA - uses CPAP at least 4 hours a night.  HISTORY:  She started having these intractable headaches in her 25s.  They usually occur once a year but not necessarily same time of year.  Most recent episode started in September and lasted 2 months.  She describes a daily headache.  A persistent dull headache with severe episodes occurring 15 days out of the month.  It is a holocephalic pressure/pounding (sometimes right sided) with photophobia and osmophobia but no nausea, phonophobia, autonomic symptoms, numbness or weakness.  She sees spots, but she has that all of the time.  She treated with Tylenol, which she has been taking daily for years to treat her arthritis.  Her PCP changed it to meloxicam which she continues to take daily.  No known triggers.  She has these headaches once a year but not necessarily same time of year.  She can't recall if they only occur with change in seasons.     She also has history of migraines since her 58s.  The are severe pounding pain involving frontal region/occipital region and right side of face.  Associated with nausea, photophobia, phonophobia and osmophobia.  They last 2 to 3 days and occur 6 times a year.   Past Imaging: 10/06/2017 CT HEAD WO:  Normal      Past NSAIDS/analgesics:  acetaminophen, ibuprofen, acetaminophen, tramadol Past abortive triptans:  none Past abortive ergotamine:  none Past muscle relaxants:  cyclobenzaprine, tizanidine Past anti-emetic:  none Past antihypertensive medications:  amlodipine Past antidepressant medications:  none Past anticonvulsant medications:  none Past anti-CGRP:  none Past vitamins/Herbal/Supplements:  none Past antihistamines/decongestants:  none Other past therapies:  none    Family history of headache:  Brother.  No family history of aneurysm.   PAST MEDICAL HISTORY: Past Medical History:  Diagnosis Date    Acquired dilation of ascending aorta and aortic root ([place])    65m by 2D echo 03/2019   Allergy    Aortic valve disease    functionally bicuspid AV with fusion of the right and left coronary cusps with no AS by echo 03/2020   Arthritis    in back   Chest pain    neg cath 2016 after false positive myoview   Depression    GERD (gastroesophageal reflux disease)    Hyperglycemia    Hyperlipidemia    Hypertension    Joint pain    Knee pain    Low back pain    Migraine    Mitral valve prolapse    Muscle cramps    Prediabetes    [place]ulmonary HTN ([place]    mild with PASP 326mg by echo 03/2019 but 2676m on echo 03/2020    MEDICATIONS: Current Outpatient Medications on File Prior to Visit  Medication Sig Dispense Refill   atorvastatin (LIPITOR) 80 MG tablet Take 1 tablet (80 mg total) by mouth daily. 90 tablet 1   carvedilol (COREG) 12.5 MG tablet Take 1 tablet (12.5 mg total) by mouth 2 (two) times daily with a meal. 180 tablet 1   hydrALAZINE (APRESOLINE) 100 MG tablet Take 1 tablet (100 mg total) by mouth 3 (three) times daily. 270 tablet 1   losartan (COZAAR) 100 MG tablet Take 1 tablet (100 mg total) by mouth daily. 90 tablet 1   meloxicam (MOBIC) 7.5 MG tablet TAKE 1 TABLET BY MOUTH EVERY DAY 90 tablet 0   No current facility-administered medications on file prior to visit.    ALLERGIES: No Known Allergies  FAMILY HISTORY: Family History  Problem Relation Age of Onset   Hyperlipidemia Mother    Hypertension Mother    AAA (abdominal aortic aneurysm) Mother    Stroke Father    Hypertension Father    Hyperlipidemia Brother        x2   Hypertension Brother        x2   Liver cancer Maternal Aunt    Breast cancer Cousin    Colon cancer Cousin    Colon polyps Neg Hx    Esophageal cancer Neg Hx    Stomach cancer Neg Hx    Rectal cancer Neg Hx       Objective:  Blood pressure (!) 185/115, pulse 82, resp. rate 18, height 5' 10.5" (1.791 m), weight 288 lb (130.6 kg), last  menstrual period 05/16/2021, SpO2 100 %. General: No acute distress.  Patient appears well-groomed.     AdaMetta ClinesO  CC: EstLelon FrohlichD

## 2021-11-29 ENCOUNTER — Ambulatory Visit (INDEPENDENT_AMBULATORY_CARE_PROVIDER_SITE_OTHER): Payer: BC Managed Care – PPO | Admitting: Neurology

## 2021-11-29 ENCOUNTER — Encounter: Payer: Self-pay | Admitting: Neurology

## 2021-11-29 VITALS — BP 185/115 | HR 82 | Resp 18 | Ht 70.5 in | Wt 288.0 lb

## 2021-11-29 DIAGNOSIS — G43719 Chronic migraine without aura, intractable, without status migrainosus: Secondary | ICD-10-CM | POA: Diagnosis not present

## 2021-11-29 MED ORDER — TOPIRAMATE 25 MG PO TABS: 25.0000 mg | ORAL_TABLET | Freq: Every day | ORAL | 5 refills | Status: DC

## 2021-11-29 MED ORDER — RIZATRIPTAN BENZOATE 10 MG PO TBDP: ORAL_TABLET | ORAL | 5 refills | Status: DC

## 2021-11-29 NOTE — Patient Instructions (Signed)
  Start topiramate '25mg'$  at bedtime.  Contact us in 4 weeks with update and we can increase dose if needed. Take rizatriptan '10mg'$  at earliest onset of headache.  May repeat dose once in 2 hours if needed.  Maximum 2 tablets in 24 hours. Limit use of pain relievers to no more than 2 days out of the week.  These medications include acetaminophen, NSAIDs (ibuprofen/Advil/Motrin, naproxen/Aleve, triptans (Imitrex/sumatriptan), Excedrin, and narcotics.  This will help reduce risk of rebound headaches. Be aware of common food triggers:  - Caffeine:  coffee, black tea, cola, Mt. Dew  - Chocolate  - Dairy:  aged cheeses (brie, blue, cheddar, gouda, [place], provolone, [place], Swiss, etc), chocolate milk, buttermilk, sour cream, limit eggs and yogurt  - Nuts, peanut butter  - Alcohol  - Cereals/grains:  FRESH breads (fresh bagels, sourdough, doughnuts), yeast productions  - Processed/canned/aged/cured meats (pre-packaged deli meats, hotdogs)  - MSG/glutamate:  soy sauce, flavor enhancer, pickled/preserved/marinated foods  - Sweeteners:  aspartame (Equal, Nutrasweet).  Sugar and Splenda are okay  - Vegetables:  legumes (lima beans, lentils, snow peas, fava beans, pinto peans, peas, garbanzo beans), sauerkraut, onions, olives, pickles  - Fruit:  avocados, bananas, citrus fruit (orange, lemon, grapefruit), mango  - Other:  Frozen meals, macaroni and cheese Routine exercise Stay adequately hydrated (aim for 64 oz water daily) Keep headache diary Maintain proper stress management Maintain proper sleep hygiene Do not skip meals Consider supplements:  magnesium citrate '400mg'$  daily, riboflavin '400mg'$  daily, coenzyme Q10 '100mg'$  three times daily. Follow up 4-5 months.

## 2021-12-01 ENCOUNTER — Telehealth: Payer: Self-pay | Admitting: *Deleted

## 2021-12-01 NOTE — Telephone Encounter (Signed)
LVM letting patient know that with dual ins nothing is due upfront and to contact insurance with further questions or the [facility] at [phone] ext [phone]

## 2021-12-03 ENCOUNTER — Other Ambulatory Visit: Payer: Self-pay | Admitting: Internal Medicine

## 2021-12-03 ENCOUNTER — Other Ambulatory Visit: Payer: Self-pay | Admitting: Cardiology

## 2021-12-03 DIAGNOSIS — I5032 Chronic diastolic (congestive) heart failure: Secondary | ICD-10-CM

## 2021-12-03 DIAGNOSIS — I10 Essential (primary) hypertension: Secondary | ICD-10-CM

## 2021-12-04 ENCOUNTER — Encounter (INDEPENDENT_AMBULATORY_CARE_PROVIDER_SITE_OTHER): Payer: BC Managed Care – PPO | Admitting: Ophthalmology

## 2021-12-04 ENCOUNTER — Ambulatory Visit: Payer: BC Managed Care – PPO | Admitting: Internal Medicine

## 2021-12-05 ENCOUNTER — Telehealth: Payer: Self-pay | Admitting: *Deleted

## 2021-12-05 NOTE — Telephone Encounter (Signed)
LVM to schedule pre/post op appts

## 2021-12-07 ENCOUNTER — Ambulatory Visit (INDEPENDENT_AMBULATORY_CARE_PROVIDER_SITE_OTHER): Payer: BC Managed Care – PPO | Admitting: Internal Medicine

## 2021-12-07 VITALS — BP 181/103 | HR 70 | Temp 98.5°F | Wt 282.9 lb

## 2021-12-07 DIAGNOSIS — G43809 Other migraine, not intractable, without status migrainosus: Secondary | ICD-10-CM | POA: Diagnosis not present

## 2021-12-07 DIAGNOSIS — I10 Essential (primary) hypertension: Secondary | ICD-10-CM

## 2021-12-07 DIAGNOSIS — E1169 Type 2 diabetes mellitus with other specified complication: Secondary | ICD-10-CM

## 2021-12-07 DIAGNOSIS — I5032 Chronic diastolic (congestive) heart failure: Secondary | ICD-10-CM

## 2021-12-07 DIAGNOSIS — E782 Mixed hyperlipidemia: Secondary | ICD-10-CM

## 2021-12-07 LAB — POCT GLYCOSYLATED HEMOGLOBIN (HGB A1C): Hemoglobin A1C: 5.7 % — AB (ref 4.0–5.6)

## 2021-12-07 MED ORDER — HYDRALAZINE HCL 100 MG PO TABS: 100.0000 mg | ORAL_TABLET | Freq: Three times a day (TID) | ORAL | 1 refills | Status: DC

## 2021-12-07 MED ORDER — LOSARTAN POTASSIUM 100 MG PO TABS: 100.0000 mg | ORAL_TABLET | Freq: Every day | ORAL | 1 refills | Status: DC

## 2021-12-07 MED ORDER — CARVEDILOL 12.5 MG PO TABS: 12.5000 mg | ORAL_TABLET | Freq: Two times a day (BID) | ORAL | 1 refills | Status: DC

## 2021-12-07 NOTE — Patient Instructions (Signed)
Blood Pressure medications: Losartan 100 mg 1 tablet once daily. Carvedilol 12.5 mg 1 tablet twice daily. Hydralazine 100 mg 1 tablet three times a day.  Send me your BP numbers in about 3 weeks.

## 2021-12-07 NOTE — Progress Notes (Signed)
Established Patient Office Visit     CC/Reason for Visit: Follow-up chronic conditions  HPI: Jamie [name] is a 62 y.o. female who is coming in today for the above mentioned reasons. Past Medical History is significant for: Hypertension, hyperlipidemia, type 2 diabetes, heart failure with preserved ejection fraction, morbid obesity and obstructive sleep apnea.  She has lost an additional 6 pounds since I last saw her.  She is feeling well and has no acute concerns or complaints.  She saw neurology for her migraines and was placed on topiramate.  She also tells me that she is scheduled for breast reduction surgery in January.  Unfortunately her blood pressure is elevated today, 2 measurements are 150/100 and 181/103.  Ambulatory measurements are 193/104, 153/87, 156/92, 185/115.  After reviewing medication she is not taking them as prescribed.  She is only taking 1 carvedilol dose frequently forgetting the evening dose, she is also not taking hydralazine or losartan at all.   Past Medical/Surgical History: Past Medical History:  Diagnosis Date   Acquired dilation of ascending aorta and aortic root ([place])    71m by 2D echo 03/2019   Allergy    Aortic valve disease    functionally bicuspid AV with fusion of the right and left coronary cusps with no AS by echo 03/2020   Arthritis    in back   Chest pain    neg cath 2016 after false positive myoview   Depression    GERD (gastroesophageal reflux disease)    Hyperglycemia    Hyperlipidemia    Hypertension    Joint pain    Knee pain    Low back pain    Migraine    Mitral valve prolapse    Muscle cramps    Prediabetes    Pulmonary HTN ([place]    mild with PASP 388mg by echo 03/2019 but 2682m on echo 03/2020    Past Surgical History:  Procedure Laterality Date   BACK SURGERY     CESAREAN SECTION     LEFT HEART CATHETERIZATION WITH CORONARY ANGIOGRAM N/A 07/08/2013   Procedure: LEFT HEART CATHETERIZATION WITH CORONARY  ANGIOGRAM;  Surgeon: PetJosue HectorD;  [facility];  Service: Cardiovascular;  Laterality: N/A;   TUBAL LIGATION      Social History:  reports that she quit smoking about 25 years ago. Her smoking use included cigarettes. She has been exposed to tobacco smoke. She has never used smokeless tobacco. She reports current alcohol use. She reports that she does not use drugs.  Allergies: No Known Allergies  Family History:  Family History  Problem Relation Age of Onset   Hyperlipidemia Mother    Hypertension Mother    AAA (abdominal aortic aneurysm) Mother    Stroke Father    Hypertension Father    Hyperlipidemia Brother        x2   Hypertension Brother        x2   Liver cancer Maternal Aunt    Breast cancer Cousin    Colon cancer Cousin    Colon polyps Neg Hx    Esophageal cancer Neg Hx    Stomach cancer Neg Hx    Rectal cancer Neg Hx      Current Outpatient Medications:    atorvastatin (LIPITOR) 80 MG tablet, Take 1 tablet (80 mg total) by mouth daily., Disp: 90 tablet, Rfl: 1   meloxicam (MOBIC) 7.5 MG tablet, TAKE 1 TABLET BY MOUTH EVERY DAY, Disp: 90  tablet, Rfl: 0   rizatriptan (MAXALT-MLT) 10 MG disintegrating tablet, Take 1 tablet earliest onset of migraine.  May repeat in 2 hours if needed.  Maximum 2 tablets in 24 hours, Disp: 10 tablet, Rfl: 5   topiramate (TOPAMAX) 25 MG tablet, Take 1 tablet (25 mg total) by mouth at bedtime., Disp: 30 tablet, Rfl: 5   carvedilol (COREG) 12.5 MG tablet, Take 1 tablet (12.5 mg total) by mouth 2 (two) times daily with a meal., Disp: 180 tablet, Rfl: 1   hydrALAZINE (APRESOLINE) 100 MG tablet, Take 1 tablet (100 mg total) by mouth 3 (three) times daily., Disp: 270 tablet, Rfl: 1   losartan (COZAAR) 100 MG tablet, Take 1 tablet (100 mg total) by mouth daily., Disp: 90 tablet, Rfl: 1  Review of Systems:  Constitutional: Denies fever, chills, diaphoresis, appetite change and fatigue.  HEENT: Denies photophobia, eye pain,  redness, hearing loss, ear pain, congestion, sore throat, rhinorrhea, sneezing, mouth sores, trouble swallowing, neck pain, neck stiffness and tinnitus.   Respiratory: Denies SOB, DOE, cough, chest tightness,  and wheezing.   Cardiovascular: Denies chest pain, palpitations and leg swelling.  Gastrointestinal: Denies nausea, vomiting, abdominal pain, diarrhea, constipation, blood in stool and abdominal distention.  Genitourinary: Denies dysuria, urgency, frequency, hematuria, flank pain and difficulty urinating.  Endocrine: Denies: hot or cold intolerance, sweats, changes in hair or nails, polyuria, polydipsia. Musculoskeletal: Denies myalgias, back pain, joint swelling, arthralgias and gait problem.  Skin: Denies pallor, rash and wound.  Neurological: Denies dizziness, seizures, syncope, weakness, light-headedness, numbness and headaches.  Hematological: Denies adenopathy. Easy bruising, personal or family bleeding history  Psychiatric/Behavioral: Denies suicidal ideation, mood changes, confusion, nervousness, sleep disturbance and agitation    Physical Exam: Vitals:   12/07/21 0708 12/07/21 0712  BP: (!) 150/100 (!) 181/103  Pulse: 70   Temp: 98.5 F (36.9 C)   TempSrc: Oral   SpO2: 100%   Weight: 282 lb 14.4 oz (128.3 kg)     Body mass index is 40.02 kg/m.   Constitutional: NAD, calm, comfortable Eyes: PERRL, lids and conjunctivae normal ENMT: Mucous membranes are moist. Respiratory: clear to auscultation bilaterally, no wheezing, no crackles. Normal respiratory effort. No accessory muscle use.  Cardiovascular: Regular rate and rhythm, no murmurs / rubs / gallops. No extremity edema.   Psychiatric: Normal judgment and insight. Alert and oriented x 3. Normal mood.    Impression and Plan:  Type 2 diabetes mellitus with other specified complication, without long-term current use of insulin ([place]) - Plan: POCT glycosylated hemoglobin (Hb A1C), Urine microalbumin-creatinine with  uACR  Morbid obesity (HCC)  Other migraine without status migrainosus, not intractable  Mixed hyperlipidemia  Chronic diastolic congestive heart failure ([place])  Primary hypertension  Essential (primary) hypertension - Plan: carvedilol (COREG) 12.5 MG tablet, hydrALAZINE (APRESOLINE) 100 MG tablet, losartan (COZAAR) 100 MG tablet  Chronic diastolic (congestive) heart failure (HCC) - Plan: losartan (COZAAR) 100 MG tablet  -A1c of 5.7 demonstrates excellent glycemic control even off of medication. -Blood pressure is not well-controlled at all.  After carefully going over medication list she is not taking hydralazine or losartan and she is only taking 1 carvedilol dose today.  This explains why her blood pressure remains elevated. -I have helped her set alarms on her phone for the times to take her medication, I have written them down to her in detail, she is scheduled for breast reduction surgery in January, I will touch base with her in 3 weeks to make sure blood pressure  is improved prior to OR.  Time spent:32 minutes reviewing chart, interviewing and examining patient and formulating plan of care.   Patient Instructions  Blood Pressure medications: Losartan 100 mg 1 tablet once daily. Carvedilol 12.5 mg 1 tablet twice daily. Hydralazine 100 mg 1 tablet three times a day.  Send me your BP numbers in about 3 weeks.    Lelon Frohlich, MD [facility] at [facility]

## 2021-12-12 DIAGNOSIS — M170 Bilateral primary osteoarthritis of knee: Secondary | ICD-10-CM | POA: Diagnosis not present

## 2021-12-14 ENCOUNTER — Other Ambulatory Visit: Payer: Self-pay | Admitting: Internal Medicine

## 2021-12-14 ENCOUNTER — Telehealth: Payer: Self-pay

## 2021-12-14 ENCOUNTER — Ambulatory Visit (INDEPENDENT_AMBULATORY_CARE_PROVIDER_SITE_OTHER): Payer: BC Managed Care – PPO | Admitting: Student

## 2021-12-14 VITALS — BP 162/89 | HR 73 | Temp 98.1°F | Resp 18 | Ht 71.0 in

## 2021-12-14 DIAGNOSIS — Z1231 Encounter for screening mammogram for malignant neoplasm of breast: Secondary | ICD-10-CM

## 2021-12-14 DIAGNOSIS — N62 Hypertrophy of breast: Secondary | ICD-10-CM

## 2021-12-14 MED ORDER — OXYCODONE HCL 5 MG PO TABS: 5.0000 mg | ORAL_TABLET | Freq: Three times a day (TID) | ORAL | 0 refills | Status: DC | PRN

## 2021-12-14 MED ORDER — CEPHALEXIN 500 MG PO CAPS: 500.0000 mg | ORAL_CAPSULE | Freq: Four times a day (QID) | ORAL | 0 refills | Status: AC

## 2021-12-14 MED ORDER — ONDANSETRON HCL 4 MG PO TABS: 4.0000 mg | ORAL_TABLET | Freq: Three times a day (TID) | ORAL | 0 refills | Status: DC | PRN

## 2021-12-14 NOTE — H&P (View-Only) (Signed)
Patient ID: Jamie [name], female    DOB: 28-Apr-1960, 61 y.o.   MRN: [id number]  Chief Complaint  Patient presents with   Pre-op Exam      ICD-10-CM   1. Symptomatic mammary hypertrophy  N62        History of Present Illness: Jamie [name] is a 61 y.o.  female  with a history of macromastia.  She presents for preoperative evaluation for upcoming procedure, Bilateral Breast Reduction with possible free nipple graft, scheduled for 01/04/2022 with Dr.  Marla Roe  The patient is accompanied by her husband today.  Patient reports she had back surgery approximately 10 to 12 years ago.  She states that she has not had issues with anesthesia in the past.  Patient reports that she had a mammogram completed last November December.  She states it was negative.  Patient denies any personal or family history of breast cancer.  She reports she has history of mitral valve prolapse and congestive heart failure.  She states that she has not followed up with her cardiologist in over a year.  She states that her last visit, everything was normal and there were no issues.  Patient denies taking any birth control or hormone replacement.  She denies any history of miscarriages.  She denies any personal history of blood clots.  She reports that she has had aunts and uncles who have had blood clots before.  She denies any personal or family history of clotting diseases.  She denies any recent surgeries, traumas, infections or hospitalizations.  She denies any history of stroke or heart attack.  She denies any history of Crohn's disease or ulcerative colitis.  She denies any history of COPD or asthma.  Patient reports she does wear a CPAP.  She denies any history of cancer.  Patient denies any recent fevers, chills or changes in her health.  Patient reports that she is currently a G cup and would like to be a D cup.  I discussed the limitations of how much tissue can be removed during surgery.  Patient  expressed understanding.  Summary of Previous Visit: Patient was initially seen in the clinic on 07/14/2021 by Dr. Marla Roe.  At this visit, patient complained of back pain and neck pain.  She also complained of shoulder strap grooving.  Her STN bilaterally was 43 cm.  Her BMI was 42.5 kg/m.  Her preoperative bra size was a G cup.  Patient underwent physical therapy to see if this would help with her symptoms.  Patient later did physical therapy, but reported she was still experiencing upper back, neck and lower/mid back pain.  She reported she was still interested in pursuing surgical intervention.  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  States that she can return to work after 2 weeks doing light/desk duty.  PMH Significant for: Hypertension, CHF, mitral valve prolapse, type 2 diabetes  Patient states that she had her A1c completed 1 week ago.  She is unsure with the numbers.  Per chart review, it appears her A1c was 5.7.  Patient states that she does not take any medications for her type 2 diabetes.   Past Medical History: Allergies: No Known Allergies  Current Medications:  Current Outpatient Medications:    atorvastatin (LIPITOR) 80 MG tablet, Take 1 tablet (80 mg total) by mouth daily., Disp: 90 tablet, Rfl:  1   carvedilol (COREG) 12.5 MG tablet, Take 1 tablet (12.5 mg total) by mouth 2 (two) times daily with a meal., Disp: 180 tablet, Rfl: 1   hydrALAZINE (APRESOLINE) 100 MG tablet, Take 1 tablet (100 mg total) by mouth 3 (three) times daily., Disp: 270 tablet, Rfl: 1   losartan (COZAAR) 100 MG tablet, Take 1 tablet (100 mg total) by mouth daily., Disp: 90 tablet, Rfl: 1   meloxicam (MOBIC) 7.5 MG tablet, TAKE 1 TABLET BY MOUTH EVERY DAY, Disp: 90 tablet, Rfl: 0   Multiple Vitamin (MULTIVITAMIN WITH MINERALS) TABS tablet, Take 1 tablet by mouth daily., Disp: ,  Rfl:    rizatriptan (MAXALT-MLT) 10 MG disintegrating tablet, Take 1 tablet earliest onset of migraine.  May repeat in 2 hours if needed.  Maximum 2 tablets in 24 hours, Disp: 10 tablet, Rfl: 5   topiramate (TOPAMAX) 25 MG tablet, Take 1 tablet (25 mg total) by mouth at bedtime., Disp: 30 tablet, Rfl: 5  Past Medical Problems: Past Medical History:  Diagnosis Date   Acquired dilation of ascending aorta and aortic root ([place])    80m by 2D echo 03/2019   Allergy    Aortic valve disease    functionally bicuspid AV with fusion of the right and left coronary cusps with no AS by echo 03/2020   Arthritis    in back   Chest pain    neg cath 2016 after false positive myoview   Depression    GERD (gastroesophageal reflux disease)    Hyperglycemia    Hyperlipidemia    Hypertension    Joint pain    Knee pain    Low back pain    Migraine    Mitral valve prolapse    Muscle cramps    Prediabetes    Pulmonary HTN ([place]    mild with PASP 317mg by echo 03/2019 but 2659m on echo 03/2020    Past Surgical History: Past Surgical History:  Procedure Laterality Date   BACK SURGERY     CESAREAN SECTION     LEFT HEART CATHETERIZATION WITH CORONARY ANGIOGRAM N/A 07/08/2013   Procedure: LEFT HEART CATHETERIZATION WITH CORONARY ANGIOGRAM;  Surgeon: PetJosue HectorD;  [facility];  Service: Cardiovascular;  Laterality: N/A;   TUBAL LIGATION      Social History: Social History   Socioeconomic History   Marital status: Married    Spouse name: Al Conservation officer, historic buildingsNumber of children: 1   Years of education: Not on file   Highest education level: Not on file  Occupational History    Employer: [employer]  Tobacco Use   Smoking status: Former    Types: Cigarettes    Quit date: 07/31/1996    Years since quitting: 25.3    Passive exposure: Past   Smokeless tobacco: Never  Vaping Use   Vaping Use: Never used  Substance and Sexual Activity   Alcohol use: Yes    Comment: very little    Drug use: No   Sexual activity: Not on file  Other Topics Concern   Not on file  Social History Narrative   Right handed   Drinks caffeine   One floor home   Currently working   Lives with husband   Social Determinants of Health   Financial Resource Strain: Not on file  Food Insecurity: Not on file  Transportation Needs: Not on file  Physical Activity: Not on file  Stress: Not on file  Social Connections:  Not on file  Intimate Partner Violence: Not on file    Family History: Family History  Problem Relation Age of Onset   Hyperlipidemia Mother    Hypertension Mother    AAA (abdominal aortic aneurysm) Mother    Stroke Father    Hypertension Father    Hyperlipidemia Brother        x2   Hypertension Brother        x2   Liver cancer Maternal Aunt    Breast cancer Cousin    Colon cancer Cousin    Colon polyps Neg Hx    Esophageal cancer Neg Hx    Stomach cancer Neg Hx    Rectal cancer Neg Hx     Review of Systems: Denies any fevers, chills or recent changes in her health  Physical Exam: Vital Signs BP (!) 162/89 (BP Location: Left Arm, Patient Position: Sitting, Cuff Size: Large)   Pulse 73   Temp 98.1 F (36.7 C) (Oral)   Resp 18   Ht '5\' 11"'$  (1.803 m)   LMP 05/16/2021 (Approximate) Comment: pt states it wasn't heavy but painful  SpO2 99%   BMI 39.46 kg/m   Physical Exam  Constitutional:      General: Not in acute distress.    Appearance: Normal appearance. Not ill-appearing.  HENT:     Head: Normocephalic and atraumatic.  Neck:     Musculoskeletal: Normal range of motion.  Cardiovascular:     Rate and Rhythm: Normal rate Pulmonary:     Effort: Pulmonary effort is normal. No respiratory distress.  Musculoskeletal: Normal range of motion.  Lower Extremities: Varicose veins noted bilaterally, mild swelling noted bilaterally  Skin:    General: Skin is warm and dry.     Findings: No erythema or rash.  Neurological:     Mental Status: Alert and  oriented to person, place, and time. Mental status is at baseline.  Psychiatric:        Mood and Affect: Mood normal.        Behavior: Behavior normal.    Assessment/Plan: The patient is scheduled for bilateral breast reduction with possible free nipple graft with Dr. Marla Roe.  Risks, benefits, and alternatives of procedure discussed, questions answered and consent obtained.    Smoking Status: Nonsmoker; Counseling Given?  N/A Last Mammogram: 12/15/2020; Results: BI-RADS Category 1 negative.  I discussed with the patient that she will not be able to get another mammogram for another 6 months after her surgery.  I encouraged the patient to get her screening mammogram prior to surgery so she does not have to wait 6 months.  Patient expressed understanding and states that she will contact her PCP and the imaging center to arrange a mammogram prior to surgery.  Clearance will be sent to patient's PCP and cardiologist  Caprini Score: 9; Risk Factors include: Age, BMI > 25, family history of thrombosis, Visibile varicose veins, swollen legs, and length of planned surgery. Recommendation for mechanical and possible pharmacological prophylaxis. Encourage early ambulation. I will discuss the possibility of postoperative pharmacological prophylaxis with Dr. Marla Roe.   Pictures obtained: '@consult'$   Post-op Rx sent to pharmacy: Oxycodone, Zofran, Keflex  Patient was provided with the breast reduction and General Surgical Risk consent document and Pain Medication Agreement prior to their appointment.  They had adequate time to read through the risk consent documents and Pain Medication Agreement. We also discussed them in person together during this preop appointment. All of their questions were answered to their satisfaction.  Recommended calling if they have any further questions.  Risk consent form and Pain Medication Agreement to be scanned into patient's chart.  The risk that can be encountered  with breast reduction were discussed and include the following but not limited to these:  Breast asymmetry, fluid accumulation, firmness of the breast, inability to breast feed, loss of nipple or areola, skin loss, decrease or no nipple sensation, fat necrosis of the breast tissue, bleeding, infection, healing delay.  There are risks of anesthesia, changes to skin sensation and injury to nerves or blood vessels.  The muscle can be temporarily or permanently injured.  You may have an allergic reaction to tape, suture, glue, blood products which can result in skin discoloration, swelling, pain, skin lesions, poor healing.  Any of these can lead to the need for revisonal surgery or stage procedures.  A reduction has potential to interfere with diagnostic procedures.  Nipple or breast piercing can increase risks of infection.  This procedure is best done when the breast is fully developed.  Changes in the breast will continue to occur over time.  Pregnancy can alter the outcomes of previous breast reduction surgery, weight gain and weigh loss can also effect the long term appearance.   I discussed with the patient that she is at increased risk of complications postoperatively given her medical history.  Patient expressed understanding    Electronically signed by: Clance Boll, PA-C 12/14/2021 12:36 PM

## 2021-12-14 NOTE — Telephone Encounter (Signed)
Faxed Dr. Deniece Ree and Dr. Radford Pax (hold Losartan) Surgery clearance for 01/04/2022 BL breast reduction with possible free nipple graft.

## 2021-12-14 NOTE — Progress Notes (Signed)
Patient ID: Jamie [name], female    DOB: 29-Nov-1960, 61 y.o.   MRN: [id number]  Chief Complaint  Patient presents with   Pre-op Exam      ICD-10-CM   1. Symptomatic mammary hypertrophy  N62        History of Present Illness: Jamie [name] is a 61 y.o.  female  with a history of macromastia.  She presents for preoperative evaluation for upcoming procedure, Bilateral Breast Reduction with possible free nipple graft, scheduled for 01/04/2022 with Dr.  Marla Roe  The patient is accompanied by her husband today.  Patient reports she had back surgery approximately 10 to 12 years ago.  She states that she has not had issues with anesthesia in the past.  Patient reports that she had a mammogram completed last November December.  She states it was negative.  Patient denies any personal or family history of breast cancer.  She reports she has history of mitral valve prolapse and congestive heart failure.  She states that she has not followed up with her cardiologist in over a year.  She states that her last visit, everything was normal and there were no issues.  Patient denies taking any birth control or hormone replacement.  She denies any history of miscarriages.  She denies any personal history of blood clots.  She reports that she has had aunts and uncles who have had blood clots before.  She denies any personal or family history of clotting diseases.  She denies any recent surgeries, traumas, infections or hospitalizations.  She denies any history of stroke or heart attack.  She denies any history of Crohn's disease or ulcerative colitis.  She denies any history of COPD or asthma.  Patient reports she does wear a CPAP.  She denies any history of cancer.  Patient denies any recent fevers, chills or changes in her health.  Patient reports that she is currently a G cup and would like to be a D cup.  I discussed the limitations of how much tissue can be removed during surgery.  Patient  expressed understanding.  Summary of Previous Visit: Patient was initially seen in the clinic on 07/14/2021 by Dr. Marla Roe.  At this visit, patient complained of back pain and neck pain.  She also complained of shoulder strap grooving.  Her STN bilaterally was 43 cm.  Her BMI was 42.5 kg/m.  Her preoperative bra size was a G cup.  Patient underwent physical therapy to see if this would help with her symptoms.  Patient later did physical therapy, but reported she was still experiencing upper back, neck and lower/mid back pain.  She reported she was still interested in pursuing surgical intervention.  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  States that she can return to work after 2 weeks doing light/desk duty.  PMH Significant for: Hypertension, CHF, mitral valve prolapse, type 2 diabetes  Patient states that she had her A1c completed 1 week ago.  She is unsure with the numbers.  Per chart review, it appears her A1c was 5.7.  Patient states that she does not take any medications for her type 2 diabetes.   Past Medical History: Allergies: No Known Allergies  Current Medications:  Current Outpatient Medications:    atorvastatin (LIPITOR) 80 MG tablet, Take 1 tablet (80 mg total) by mouth daily., Disp: 90 tablet, Rfl:  1   carvedilol (COREG) 12.5 MG tablet, Take 1 tablet (12.5 mg total) by mouth 2 (two) times daily with a meal., Disp: 180 tablet, Rfl: 1   hydrALAZINE (APRESOLINE) 100 MG tablet, Take 1 tablet (100 mg total) by mouth 3 (three) times daily., Disp: 270 tablet, Rfl: 1   losartan (COZAAR) 100 MG tablet, Take 1 tablet (100 mg total) by mouth daily., Disp: 90 tablet, Rfl: 1   meloxicam (MOBIC) 7.5 MG tablet, TAKE 1 TABLET BY MOUTH EVERY DAY, Disp: 90 tablet, Rfl: 0   Multiple Vitamin (MULTIVITAMIN WITH MINERALS) TABS tablet, Take 1 tablet by mouth daily., Disp: ,  Rfl:    rizatriptan (MAXALT-MLT) 10 MG disintegrating tablet, Take 1 tablet earliest onset of migraine.  May repeat in 2 hours if needed.  Maximum 2 tablets in 24 hours, Disp: 10 tablet, Rfl: 5   topiramate (TOPAMAX) 25 MG tablet, Take 1 tablet (25 mg total) by mouth at bedtime., Disp: 30 tablet, Rfl: 5  Past Medical Problems: Past Medical History:  Diagnosis Date   Acquired dilation of ascending aorta and aortic root ([place])    86m by 2D echo 03/2019   Allergy    Aortic valve disease    functionally bicuspid AV with fusion of the right and left coronary cusps with no AS by echo 03/2020   Arthritis    in back   Chest pain    neg cath 2016 after false positive myoview   Depression    GERD (gastroesophageal reflux disease)    Hyperglycemia    Hyperlipidemia    Hypertension    Joint pain    Knee pain    Low back pain    Migraine    Mitral valve prolapse    Muscle cramps    Prediabetes    Pulmonary HTN ([place]    mild with PASP 311mg by echo 03/2019 but 2618m on echo 03/2020    Past Surgical History: Past Surgical History:  Procedure Laterality Date   BACK SURGERY     CESAREAN SECTION     LEFT HEART CATHETERIZATION WITH CORONARY ANGIOGRAM N/A 07/08/2013   Procedure: LEFT HEART CATHETERIZATION WITH CORONARY ANGIOGRAM;  Surgeon: PetJosue HectorD;  [facility];  Service: Cardiovascular;  Laterality: N/A;   TUBAL LIGATION      Social History: Social History   Socioeconomic History   Marital status: Married    Spouse name: Al Conservation officer, historic buildingsNumber of children: 1   Years of education: Not on file   Highest education level: Not on file  Occupational History    Employer: [employer]  Tobacco Use   Smoking status: Former    Types: Cigarettes    Quit date: 07/31/1996    Years since quitting: 25.3    Passive exposure: Past   Smokeless tobacco: Never  Vaping Use   Vaping Use: Never used  Substance and Sexual Activity   Alcohol use: Yes    Comment: very little    Drug use: No   Sexual activity: Not on file  Other Topics Concern   Not on file  Social History Narrative   Right handed   Drinks caffeine   One floor home   Currently working   Lives with husband   Social Determinants of Health   Financial Resource Strain: Not on file  Food Insecurity: Not on file  Transportation Needs: Not on file  Physical Activity: Not on file  Stress: Not on file  Social Connections:  Not on file  Intimate Partner Violence: Not on file    Family History: Family History  Problem Relation Age of Onset   Hyperlipidemia Mother    Hypertension Mother    AAA (abdominal aortic aneurysm) Mother    Stroke Father    Hypertension Father    Hyperlipidemia Brother        x2   Hypertension Brother        x2   Liver cancer Maternal Aunt    Breast cancer Cousin    Colon cancer Cousin    Colon polyps Neg Hx    Esophageal cancer Neg Hx    Stomach cancer Neg Hx    Rectal cancer Neg Hx     Review of Systems: Denies any fevers, chills or recent changes in her health  Physical Exam: Vital Signs BP (!) 162/89 (BP Location: Left Arm, Patient Position: Sitting, Cuff Size: Large)   Pulse 73   Temp 98.1 F (36.7 C) (Oral)   Resp 18   Ht '5\' 11"'$  (1.803 m)   LMP 05/16/2021 (Approximate) Comment: pt states it wasn't heavy but painful  SpO2 99%   BMI 39.46 kg/m   Physical Exam  Constitutional:      General: Not in acute distress.    Appearance: Normal appearance. Not ill-appearing.  HENT:     Head: Normocephalic and atraumatic.  Neck:     Musculoskeletal: Normal range of motion.  Cardiovascular:     Rate and Rhythm: Normal rate Pulmonary:     Effort: Pulmonary effort is normal. No respiratory distress.  Musculoskeletal: Normal range of motion.  Lower Extremities: Varicose veins noted bilaterally, mild swelling noted bilaterally  Skin:    General: Skin is warm and dry.     Findings: No erythema or rash.  Neurological:     Mental Status: Alert and  oriented to person, place, and time. Mental status is at baseline.  Psychiatric:        Mood and Affect: Mood normal.        Behavior: Behavior normal.    Assessment/Plan: The patient is scheduled for bilateral breast reduction with possible free nipple graft with Dr. Marla Roe.  Risks, benefits, and alternatives of procedure discussed, questions answered and consent obtained.    Smoking Status: Nonsmoker; Counseling Given?  N/A Last Mammogram: 12/15/2020; Results: BI-RADS Category 1 negative.  I discussed with the patient that she will not be able to get another mammogram for another 6 months after her surgery.  I encouraged the patient to get her screening mammogram prior to surgery so she does not have to wait 6 months.  Patient expressed understanding and states that she will contact her PCP and the imaging center to arrange a mammogram prior to surgery.  Clearance will be sent to patient's PCP and cardiologist  Caprini Score: 9; Risk Factors include: Age, BMI > 25, family history of thrombosis, Visibile varicose veins, swollen legs, and length of planned surgery. Recommendation for mechanical and possible pharmacological prophylaxis. Encourage early ambulation. I will discuss the possibility of postoperative pharmacological prophylaxis with Dr. Marla Roe.   Pictures obtained: '@consult'$   Post-op Rx sent to pharmacy: Oxycodone, Zofran, Keflex  Patient was provided with the breast reduction and General Surgical Risk consent document and Pain Medication Agreement prior to their appointment.  They had adequate time to read through the risk consent documents and Pain Medication Agreement. We also discussed them in person together during this preop appointment. All of their questions were answered to their satisfaction.  Recommended calling if they have any further questions.  Risk consent form and Pain Medication Agreement to be scanned into patient's chart.  The risk that can be encountered  with breast reduction were discussed and include the following but not limited to these:  Breast asymmetry, fluid accumulation, firmness of the breast, inability to breast feed, loss of nipple or areola, skin loss, decrease or no nipple sensation, fat necrosis of the breast tissue, bleeding, infection, healing delay.  There are risks of anesthesia, changes to skin sensation and injury to nerves or blood vessels.  The muscle can be temporarily or permanently injured.  You may have an allergic reaction to tape, suture, glue, blood products which can result in skin discoloration, swelling, pain, skin lesions, poor healing.  Any of these can lead to the need for revisonal surgery or stage procedures.  A reduction has potential to interfere with diagnostic procedures.  Nipple or breast piercing can increase risks of infection.  This procedure is best done when the breast is fully developed.  Changes in the breast will continue to occur over time.  Pregnancy can alter the outcomes of previous breast reduction surgery, weight gain and weigh loss can also effect the long term appearance.   I discussed with the patient that she is at increased risk of complications postoperatively given her medical history.  Patient expressed understanding    Electronically signed by: Clance Boll, PA-C 12/14/2021 12:36 PM

## 2021-12-15 ENCOUNTER — Telehealth: Payer: Self-pay

## 2021-12-15 NOTE — Telephone Encounter (Signed)
..     Pre-operative Risk Assessment    Patient Name: Jamie [name]  DOB: 14-Nov-1960 MRN: [id number]      Request for Surgical Clearance    Procedure:   BL REDUCTION WITH POSSIBLE FREE NIPPLE GRAFT  Date of Surgery:  Clearance 01/04/22                                 Surgeon:  DR Audelia Hives Surgeon's Group or Practice Name:  [facility] Phone number:  [phone] Fax number:  [phone]   Type of Clearance Requested:   - Medical    Type of Anesthesia:  General    Additional requests/questions:    Gwenlyn Found   12/15/2021, 9:17 AM

## 2021-12-15 NOTE — Telephone Encounter (Signed)
Pt agreeable to plan of care for in office appt 12/26/21 @ 8:50 am with Ambrose Pancoast, NP.

## 2021-12-15 NOTE — Telephone Encounter (Signed)
Primary Cardiologist:Traci Turner, MD  Chart reviewed as part of pre-operative protocol coverage. Because of Jamie [name]'s past medical history and time since last visit, he/she will require a follow-up visit in order to better assess preoperative cardiovascular risk.  Pre-op covering staff: - Please schedule appointment and call patient to inform them. - Please contact requesting surgeon's office via preferred method (i.e, phone, fax) to inform them of need for appointment prior to surgery.   Emmaline Life, NP-C  12/15/2021, 11:03 AM [address], [address] Office [phone] Fax [phone]

## 2021-12-18 ENCOUNTER — Telehealth: Payer: Self-pay

## 2021-12-18 NOTE — Telephone Encounter (Signed)
Form received on 12/14/21. Asking for completion by 12/27/21 for 01/01/22 surgery date.  Last OV 12/07/21  Form placed in provider's red folder in office.

## 2021-12-21 ENCOUNTER — Other Ambulatory Visit: Payer: Self-pay | Admitting: Student

## 2021-12-21 MED ORDER — ENOXAPARIN SODIUM 40 MG/0.4ML IJ SOSY: 40.0000 mg | PREFILLED_SYRINGE | INTRAMUSCULAR | 0 refills | Status: DC

## 2021-12-21 NOTE — Telephone Encounter (Signed)
Form faxed

## 2021-12-21 NOTE — Progress Notes (Signed)
Elevated caprini score was discussed with Dr. Marla Roe, postoperative lovenox was recommended. I discussed with the patient I will prescribe her Lovenox which is an injection that she will give herself for 7 days postoperatively.  I discussed with the patient that her first dose should be 24 hours after surgery.  Patient expressed understanding.

## 2021-12-25 NOTE — Progress Notes (Unsigned)
Office Visit    Patient Name: Jamie [name] Date of Encounter: 12/25/2021  Primary Care Provider:  Isaac Bliss, Rayford Halsted, MD Primary Cardiologist:  Fransico Him, MD Primary Electrophysiologist: None  Chief Complaint    Jamie [name] is a 61 y.o. female with PMH of HFpEF, bicuspid aortic valve, HTN, HLD, MVP, GERD, OSA (on CPAP), ascending aneurysm (38 mm) who presents today for preoperative clearance.  Past Medical History    Past Medical History:  Diagnosis Date   Acquired dilation of ascending aorta and aortic root ([place])    56m by 2D echo 03/2019   Allergy    Aortic valve disease    functionally bicuspid AV with fusion of the right and left coronary cusps with no AS by echo 03/2020   Arthritis    in back   Chest pain    neg cath 2016 after false positive myoview   Depression    GERD (gastroesophageal reflux disease)    Hyperglycemia    Hyperlipidemia    Hypertension    Joint pain    Knee pain    Low back pain    Migraine    Mitral valve prolapse    Muscle cramps    Prediabetes    Pulmonary HTN ([place]    mild with PASP 361mg by echo 03/2019 but 2658m on echo 03/2020   Past Surgical History:  Procedure Laterality Date   BACK SURGERY     CESAREAN SECTION     LEFT HEART CATHETERIZATION WITH CORONARY ANGIOGRAM N/A 07/08/2013   Procedure: LEFT HEART CATHETERIZATION WITH CORONARY ANGIOGRAM;  Surgeon: PetJosue HectorD;  [facility];  Service: Cardiovascular;  Laterality: N/A;   TUBAL LIGATION      Allergies  No Known Allergies  History of Present Illness    Jamie [name] a 61 45ar old female with the above mention past medical history who presents today for preoperative clearance of breast reduction.  [name] initially seen in 2012 for evaluation of chest pain.  She underwent 2-day stress Myoview that was normal.  She continued to have exertional symptoms and underwent subsequent Myoview 06/2013 that was abnormal. She  underwent  LHC 07/2013 with normal coronaries and EF of 55-60%.  2D echo was completed 08/2018 with grade 1 DD and normal EF.  She was lost to follow-up until 2021 when she was seen by Dr. TurRadford Paxr chest pain and shortness of breath.  She reported nonexertional discomfort with shortness of breath.  She underwent coronary CTA with calcium score of 0 and no CAD.  Repeat 2D echo was also performed showing grade 2 DD and mild pulmonary HTN with PASP of 40 mmHg and 38 mm dilated ascending aorta.  She completed a home sleep study that showed severe OSA and CPAP therapy was initiated.  She was last seen by Dr. TurRadford Pax 05/2020 follow-up and reported doing well.  Her blood pressure was well-controlled and she denied shortness of breath with exertion.  Since last being seen in the office patient reports***.  Patient denies chest pain, palpitations, dyspnea, PND, orthopnea, nausea, vomiting, dizziness, syncope, edema, weight gain, or early satiety.     ***Notes: -Scheduled breast reduction 01/04/2021 Home Medications    Current Outpatient Medications  Medication Sig Dispense Refill   atorvastatin (LIPITOR) 80 MG tablet Take 1 tablet (80 mg total) by mouth daily. 90 tablet 1   carvedilol (COREG) 12.5 MG tablet Take 1 tablet (12.5 mg  total) by mouth 2 (two) times daily with a meal. 180 tablet 1   enoxaparin (LOVENOX) 40 MG/0.4ML injection Inject 0.4 mLs (40 mg total) into the skin daily for 7 days. 2.8 mL 0   hydrALAZINE (APRESOLINE) 100 MG tablet Take 1 tablet (100 mg total) by mouth 3 (three) times daily. 270 tablet 1   losartan (COZAAR) 100 MG tablet Take 1 tablet (100 mg total) by mouth daily. 90 tablet 1   meloxicam (MOBIC) 7.5 MG tablet TAKE 1 TABLET BY MOUTH EVERY DAY 90 tablet 0   Multiple Vitamin (MULTIVITAMIN WITH MINERALS) TABS tablet Take 1 tablet by mouth daily.     ondansetron (ZOFRAN) 4 MG tablet Take 1 tablet (4 mg total) by mouth every 8 (eight) hours as needed for up to 20 doses for nausea or  vomiting. 20 tablet 0   oxyCODONE (ROXICODONE) 5 MG immediate release tablet Take 1 tablet (5 mg total) by mouth every 8 (eight) hours as needed for up to 20 doses for severe pain. 20 tablet 0   rizatriptan (MAXALT-MLT) 10 MG disintegrating tablet Take 1 tablet earliest onset of migraine.  May repeat in 2 hours if needed.  Maximum 2 tablets in 24 hours 10 tablet 5   topiramate (TOPAMAX) 25 MG tablet Take 1 tablet (25 mg total) by mouth at bedtime. 30 tablet 5   No current facility-administered medications for this visit.     Review of Systems  Please see the history of present illness.    (+)*** (+)***  All other systems reviewed and are otherwise negative except as noted above.  Physical Exam    Wt Readings from Last 3 Encounters:  12/07/21 282 lb 14.4 oz (128.3 kg)  11/29/21 288 lb (130.6 kg)  09/06/21 286 lb 6.4 oz (129.9 kg)   DU:KGURK were no vitals filed for this visit.,There is no height or weight on file to calculate BMI.  Constitutional:      Appearance: Healthy appearance. Not in distress.  Neck:     Vascular: JVD normal.  Pulmonary:     Effort: Pulmonary effort is normal.     Breath sounds: No wheezing. No rales. Diminished in the bases Cardiovascular:     Normal rate. Regular rhythm. Normal S1. Normal S2.      Murmurs: There is no murmur.  Edema:    Peripheral edema absent.  Abdominal:     Palpations: Abdomen is soft non tender. There is no hepatomegaly.  Skin:    General: Skin is warm and dry.  Neurological:     General: No focal deficit present.     Mental Status: Alert and oriented to person, place and time.     Cranial Nerves: Cranial nerves are intact.  EKG/LABS/Other Studies Reviewed    ECG personally reviewed by me today - ***  Risk Assessment/Calculations:   {Does this patient have ATRIAL FIBRILLATION?[phone]}        Lab Results  Component Value Date   WBC 4.2 06/14/2021   HGB 11.3 (L) 06/14/2021   HCT 35.9 (L) 06/14/2021   MCV 90.9  06/14/2021   PLT 184 06/14/2021   Lab Results  Component Value Date   CREATININE 0.86 09/06/2021   BUN 9 09/06/2021   NA 141 09/06/2021   K 3.8 09/06/2021   CL 106 09/06/2021   CO2 29 09/06/2021   Lab Results  Component Value Date   ALT 16 09/06/2021   AST 14 09/06/2021   ALKPHOS 56 09/06/2021   BILITOT 0.5  09/06/2021   Lab Results  Component Value Date   CHOL 133 05/31/2021   HDL 59.40 05/31/2021   LDLCALC 59 05/31/2021   TRIG 74.0 05/31/2021   CHOLHDL 2 05/31/2021    Lab Results  Component Value Date   HGBA1C 5.7 (A) 12/07/2021    Assessment & Plan    1.{Click Here to Calculate RCRI      :409811914}  { Click Here to Calculate DASI      :782956213} {Select to add RCRI Risk (<1%=LOW; >/=1%=HIGH) (Optional):21036017}  {Select if HIGH (RCRI >/=1%) Risk (Optional):21036030} Recommendations: {2014 ACC/AHA Perioperative Guidelines  :21036001} Antiplatelet and/or Anticoagulation Recommendations: {Antiplatelet Recommendations                  :21036016} {Anticoagulation Recommendations           :21036019}    2.  HFpEF: -2D echo completed on 03/2020 with EF of 50-55%, no RWMA, with no AV/MV regurgitation present -Patient's volume status today is*** -Low sodium diet, fluid restriction <2L, and daily weights encouraged. Educated to contact our office for weight gain of 2 lbs overnight or 5 lbs in one week.   3.  Essential hypertension: -Patient's blood pressure today was*** -Continue carvedilol 12.5 mg twice daily, hydralazine 100 mg 3 times daily, losartan 100 mg daily  4.  Hyperlipidemia: -Patient's last LDL was*** -Continue atorvastatin 80 mg daily  5.  Obstructive sleep apnea: -Patient reports***      Disposition: Follow-up with Fransico Him, MD or APP in *** months {Are you ordering a CV Procedure (e.g. stress test, cath, DCCV, TEE, etc)?   Press F2        :[id number]}   Medication Adjustments/Labs and Tests Ordered: Current medicines are reviewed at length  with the patient today.  Concerns regarding medicines are outlined above.   Signed, Mable Fill, Marissa Nestle, NP 12/25/2021, 10:29 AM [facility]  Note:  This document was prepared using Dragon voice recognition software and may include unintentional dictation errors.

## 2021-12-26 ENCOUNTER — Encounter: Payer: Self-pay | Admitting: Nurse Practitioner

## 2021-12-26 ENCOUNTER — Ambulatory Visit: Payer: BC Managed Care – PPO | Attending: Nurse Practitioner | Admitting: Nurse Practitioner

## 2021-12-26 VITALS — BP 134/86 | HR 74 | Ht 71.0 in | Wt 285.2 lb

## 2021-12-26 DIAGNOSIS — I5032 Chronic diastolic (congestive) heart failure: Secondary | ICD-10-CM | POA: Diagnosis not present

## 2021-12-26 DIAGNOSIS — Z01810 Encounter for preprocedural cardiovascular examination: Secondary | ICD-10-CM

## 2021-12-26 DIAGNOSIS — G4733 Obstructive sleep apnea (adult) (pediatric): Secondary | ICD-10-CM | POA: Diagnosis not present

## 2021-12-26 DIAGNOSIS — I10 Essential (primary) hypertension: Secondary | ICD-10-CM | POA: Diagnosis not present

## 2021-12-26 DIAGNOSIS — E785 Hyperlipidemia, unspecified: Secondary | ICD-10-CM

## 2021-12-26 NOTE — Patient Instructions (Signed)
Medication Instructions:  Your physician recommends that you continue on your current medications as directed. Please refer to the Current Medication list given to you today.  *If you need a refill on your cardiac medications before your next appointment, please call your pharmacy*  Follow-Up: At [facility], you and your health needs are our priority.  As part of our continuing mission to provide you with exceptional heart care, we have created designated Provider Care Teams.  These Care Teams include your primary Cardiologist (physician) and Advanced Practice Providers (APPs -  Physician Assistants and Nurse Practitioners) who all work together to provide you with the care you need, when you need it.  Your next appointment:   1 year(s)  The format for your next appointment:   In Person  Provider:   Traci Turner, MD     Important Information About Sugar       

## 2021-12-27 ENCOUNTER — Telehealth: Payer: Self-pay | Admitting: Internal Medicine

## 2021-12-27 ENCOUNTER — Telehealth: Payer: Self-pay

## 2021-12-27 NOTE — Telephone Encounter (Signed)
Error

## 2021-12-27 NOTE — Telephone Encounter (Signed)
Patient is scheduled for 01/03/22 at 3:30 for follow up appointment for SX clearance. Called patient, LMVM with date and time of arrival. Date of surgery is 01/04/2022. Patient has been cleared by her cardiologist.

## 2021-12-27 NOTE — Telephone Encounter (Signed)
Ms. Jamie [name] (Plastic Surgery Office) called to FU on Pt's BP.  [phone]  Pt already cleared by Cardio Doctor. Pt must be seen by PCP before that 01/04/22 surgery date, for surgical clearance. Pt scheduled for 01/03/22 at 3 pm. (BP check/Surgical Clearance)

## 2021-12-28 ENCOUNTER — Encounter: Payer: Self-pay | Admitting: Student

## 2021-12-28 ENCOUNTER — Telehealth: Payer: Self-pay | Admitting: Plastic Surgery

## 2021-12-28 NOTE — Progress Notes (Signed)
Patient has been cleared by her cardiology provider, Ambrose Pancoast, NP in regards to the patient's upcoming surgery with Dr. Marla Roe.  Per Ambrose Pancoast, NP, patient is an acceptable risk for the planned procedure without further cardiovascular testing.  She is at low risk for perioperative complications and the patient may proceed to surgery at an acceptable risk.

## 2021-12-28 NOTE — Telephone Encounter (Signed)
Pt called to let us know she sees her primary on 1.3.24 at 3:15 for sx clearance, she stated she would call us after her apt.

## 2021-12-29 ENCOUNTER — Encounter: Payer: Self-pay | Admitting: Student

## 2021-12-29 NOTE — Progress Notes (Signed)
Received surgical clearance sheet back from Jamie [name]'s PCP, Dr. Isaac Bliss.  Per Jamie [name]'s PCP, Jamie [name] is not medically cleared for surgery as her blood pressure was 181/103 at her last visit with her.  Per Jamie [name]'s PCP, Dr. Isaac Bliss, Jamie [name] needs to follow-up with her prior to surgery.  Medication changes were made at her appointment with her PCP on 12/07/2021 per Jamie [name]'s PCP.  I called the Jamie [name] to discuss this with her. Jamie [name] reports her blood pressure has been improving. Jamie [name] states that she has an appointment with her PCP on 01/03/2022.  Jamie [name] states that she would like to keep her surgery date.  We will plan on having the Jamie [name] call our clinic after she sees her PCP to make sure that she is still okay moving forward with surgery.  Jamie [name] was in agreement with this plan and will call us at that time.  Dr. Marla Roe has been made aware.

## 2021-12-29 NOTE — H&P (View-Only) (Signed)
Received surgical clearance sheet back from patient's PCP, Dr. Isaac Bliss.  Per patient's PCP, patient is not medically cleared for surgery as her blood pressure was 181/103 at her last visit with her.  Per patient's PCP, Dr. Isaac Bliss, patient needs to follow-up with her prior to surgery.  Medication changes were made at her appointment with her PCP on 12/07/2021 per patient's PCP.  I called the patient to discuss this with her. Patient reports her blood pressure has been improving. Patient states that she has an appointment with her PCP on 01/03/2022.  Patient states that she would like to keep her surgery date.  We will plan on having the patient call our clinic after she sees her PCP to make sure that she is still okay moving forward with surgery.  Patient was in agreement with this plan and will call us at that time.  Dr. Marla Roe has been made aware.

## 2022-01-03 ENCOUNTER — Encounter (HOSPITAL_COMMUNITY): Payer: Self-pay | Admitting: Plastic Surgery

## 2022-01-03 ENCOUNTER — Encounter: Payer: Self-pay | Admitting: Internal Medicine

## 2022-01-03 ENCOUNTER — Ambulatory Visit (INDEPENDENT_AMBULATORY_CARE_PROVIDER_SITE_OTHER): Payer: BC Managed Care – PPO | Admitting: Internal Medicine

## 2022-01-03 ENCOUNTER — Telehealth: Payer: Self-pay | Admitting: Plastic Surgery

## 2022-01-03 VITALS — BP 130/79 | HR 73 | Temp 98.3°F | Wt 282.3 lb

## 2022-01-03 DIAGNOSIS — I10 Essential (primary) hypertension: Secondary | ICD-10-CM | POA: Diagnosis not present

## 2022-01-03 DIAGNOSIS — Z0289 Encounter for other administrative examinations: Secondary | ICD-10-CM

## 2022-01-03 NOTE — Progress Notes (Signed)
PCP - Dr Isaac Bliss Cardiologist - Dr Fransico Him  Chest x-ray - 06/14/21 (2V) EKG - 06/14/21 Stress Test - 12/09/19 ECHO - 03/03/20 Cardiac Cath - 07/08/13  ICD Pacemaker/Loop - n/a  Sleep Study -  Yes CPAP - uses CPAP   Diabetes Type 2 -  diet controlled, no meds  Anesthesia review: Yes  STOP now taking any Aspirin (unless otherwise instructed by your surgeon), Aleve, Naproxen, Ibuprofen, Motrin, Advil, Goody's, BC's, all herbal medications, fish oil, and all vitamins.   Coronavirus Screening Do you have any of the following symptoms:  Cough yes/no: No Fever (>100.20F)  yes/no: No Runny nose yes/no: No Congestion - Yes Sore throat yes/no: No Difficulty breathing/shortness of breath  yes/no: No  Have you traveled in the last 14 days and where? Yes - went on cruise left on 12/16/21 and returned on 12/21/21 to [place].  Patient verbalized understanding of instructions that were given via phone. Patient is checking her BPs at home with BP ranging in the 140s/90s.  Patient has appt. at 3:15 PM today with PCP for surgery clearance.

## 2022-01-03 NOTE — Progress Notes (Signed)
Established Patient Office Visit     CC/Reason for Visit: Blood pressure follow-up  HPI: Jamie [name] is a 62 y.o. female who is coming in today for the above mentioned reasons.  She was seen on 12/7 for preoperative clearance in order to have breast reduction.  Unfortunately I was unable to clear at that time due to markedly elevated blood pressure.  After reviewing medication list she was not taking them appropriately.  She is here today for follow-up.  Her breast reduction surgery is scheduled for tomorrow 01/04/2022.  She has been adherent to medical therapy in the last 3 weeks.  She was seen by cardiology on 12/26 and by then blood pressure had already improved.  She was cleared by them to proceed with surgery.   Past Medical/Surgical History: Past Medical History:  Diagnosis Date   Acquired dilation of ascending aorta and aortic root ([place])    19m by 2D echo 03/2019   Allergy    Aortic valve disease    functionally bicuspid AV with fusion of the right and left coronary cusps with no AS by echo 03/2020   Arthritis    in back   Chest pain    neg cath 2016 after false positive myoview   Depression    Diabetes mellitus without complication (HCC)    type 2, no meds   Dyspnea    with exertion   GERD (gastroesophageal reflux disease)    Hyperglycemia    Hyperlipidemia    Hypertension    Joint pain    Knee pain    Low back pain    Migraine    Mitral valve prolapse    Muscle cramps    Pulmonary HTN ([place]    mild with PASP 34mg by echo 03/2019 but 2658m on echo 03/2020   Sleep apnea    uses cpap    Past Surgical History:  Procedure Laterality Date   BACK SURGERY     BREAST BIOPSY     CESAREAN SECTION     x 1   COLONOSCOPY     LEFT HEART CATHETERIZATION WITH CORONARY ANGIOGRAM N/A 07/08/2013   Procedure: LEFT HEART CATHETERIZATION WITH CORONARY ANGIOGRAM;  Surgeon: PetJosue HectorD;  [facility];  Service: Cardiovascular;  Laterality: N/A;   TUBAL  LIGATION      Social History:  reports that she quit smoking about 25 years ago. Her smoking use included cigarettes. She has been exposed to tobacco smoke. She has never used smokeless tobacco. She reports current alcohol use. She reports that she does not use drugs.  Allergies: No Known Allergies  Family History:  Family History  Problem Relation Age of Onset   Hyperlipidemia Mother    Hypertension Mother    AAA (abdominal aortic aneurysm) Mother    Stroke Father    Hypertension Father    Hyperlipidemia Brother        x2   Hypertension Brother        x2   Liver cancer Maternal Aunt    Breast cancer Cousin    Colon cancer Cousin    Colon polyps Neg Hx    Esophageal cancer Neg Hx    Stomach cancer Neg Hx    Rectal cancer Neg Hx      Current Outpatient Medications:    acetaminophen (TYLENOL) 500 MG tablet, Take 1,000 mg by mouth every 6 (six) hours as needed for moderate pain., Disp: , Rfl:    Aromatic  Inhalants (VICKS VAPOR INHALER IN), Inhale 1 puff into the lungs daily as needed (congestion)., Disp: , Rfl:    atorvastatin (LIPITOR) 80 MG tablet, Take 1 tablet (80 mg total) by mouth daily., Disp: 90 tablet, Rfl: 1   bismuth subsalicylate (PEPTO BISMOL) 262 MG/15ML suspension, Take 30 mLs by mouth every 6 (six) hours as needed for diarrhea or loose stools or indigestion., Disp: , Rfl:    Camphor-Eucalyptus-Menthol (VICKS VAPORUB EX), Apply 1 Application topically at bedtime as needed (congestion)., Disp: , Rfl:    carvedilol (COREG) 12.5 MG tablet, Take 1 tablet (12.5 mg total) by mouth 2 (two) times daily with a meal., Disp: 180 tablet, Rfl: 1   cephALEXin (KEFLEX) 500 MG capsule, Take 500 mg by mouth 4 (four) times daily., Disp: , Rfl:    Dextromethorphan-GG-APAP (CORICIDIN HBP COLD/COUGH/FLU) 10-200-325 MG/15ML LIQD, Take 1 Dose by mouth daily as needed (congestion / cold symptoms)., Disp: , Rfl:    DM-Doxylamine-Acetaminophen (CORICIDIN HBP NIGHTTIME COLD PO), Take 2  tablets by mouth daily as needed (congestion)., Disp: , Rfl:    furosemide (LASIX) 20 MG tablet, Take 20 mg by mouth daily., Disp: , Rfl:    gabapentin (NEURONTIN) 300 MG capsule, Take 300 mg by mouth daily., Disp: , Rfl:    hydrALAZINE (APRESOLINE) 100 MG tablet, Take 1 tablet (100 mg total) by mouth 3 (three) times daily., Disp: 270 tablet, Rfl: 1   losartan (COZAAR) 100 MG tablet, Take 1 tablet (100 mg total) by mouth daily., Disp: 90 tablet, Rfl: 1   meloxicam (MOBIC) 15 MG tablet, Take 15 mg by mouth daily., Disp: , Rfl:    Multiple Vitamin (MULTIVITAMIN WITH MINERALS) TABS tablet, Take 1 tablet by mouth daily., Disp: , Rfl:    ondansetron (ZOFRAN) 4 MG tablet, Take 1 tablet (4 mg total) by mouth every 8 (eight) hours as needed for up to 20 doses for nausea or vomiting., Disp: 20 tablet, Rfl: 0   ondansetron (ZOFRAN-ODT) 4 MG disintegrating tablet, Take 4 mg by mouth every 8 (eight) hours as needed for nausea or vomiting., Disp: , Rfl:    oxyCODONE (ROXICODONE) 5 MG immediate release tablet, Take 1 tablet (5 mg total) by mouth every 8 (eight) hours as needed for up to 20 doses for severe pain., Disp: 20 tablet, Rfl: 0   rizatriptan (MAXALT-MLT) 10 MG disintegrating tablet, Take 1 tablet earliest onset of migraine.  May repeat in 2 hours if needed.  Maximum 2 tablets in 24 hours, Disp: 10 tablet, Rfl: 5   topiramate (TOPAMAX) 25 MG tablet, Take 1 tablet (25 mg total) by mouth at bedtime., Disp: 30 tablet, Rfl: 5   enoxaparin (LOVENOX) 40 MG/0.4ML injection, Inject 0.4 mLs (40 mg total) into the skin daily for 7 days., Disp: 2.8 mL, Rfl: 0  Review of Systems:  Constitutional: Denies fever, chills, diaphoresis, appetite change and fatigue.  HEENT: Denies photophobia, eye pain, redness, hearing loss, ear pain, congestion, sore throat, rhinorrhea, sneezing, mouth sores, trouble swallowing, neck pain, neck stiffness and tinnitus.   Respiratory: Denies SOB, DOE, cough, chest tightness,  and wheezing.    Cardiovascular: Denies chest pain, palpitations and leg swelling.  Gastrointestinal: Denies nausea, vomiting, abdominal pain, diarrhea, constipation, blood in stool and abdominal distention.  Genitourinary: Denies dysuria, urgency, frequency, hematuria, flank pain and difficulty urinating.  Endocrine: Denies: hot or cold intolerance, sweats, changes in hair or nails, polyuria, polydipsia. Musculoskeletal: Denies myalgias, back pain, joint swelling, arthralgias and gait problem.  Skin: Denies pallor, rash  and wound.  Neurological: Denies dizziness, seizures, syncope, weakness, light-headedness, numbness and headaches.  Hematological: Denies adenopathy. Easy bruising, personal or family bleeding history  Psychiatric/Behavioral: Denies suicidal ideation, mood changes, confusion, nervousness, sleep disturbance and agitation    Physical Exam: Vitals:   01/03/22 1522 01/03/22 1536  BP: 134/80 130/79  Pulse: 73   Temp: 98.3 F (36.8 C)   TempSrc: Oral   SpO2: 99%   Weight: 282 lb 4.8 oz (128.1 kg)     Body mass index is 39.37 kg/m.   Constitutional: NAD, calm, comfortable Eyes: PERRL, lids and conjunctivae normal ENMT: Mucous membranes are moist.  Respiratory: clear to auscultation bilaterally, no wheezing, no crackles. Normal respiratory effort. No accessory muscle use.  Cardiovascular: Regular rate and rhythm, no murmurs / rubs / gallops. No extremity edema.  Psychiatric: Normal judgment and insight. Alert and oriented x 3. Normal mood.    Impression and Plan:  Primary hypertension  -Now that she is being adherent to medication, blood pressure is much improved. -She is scheduled for breast reduction surgery tomorrow, she will take carvedilol dose in the morning.  Okay to proceed.  Time spent:23 minutes reviewing chart, interviewing and examining patient and formulating plan of care.      Lelon Frohlich, MD [facility]

## 2022-01-03 NOTE — Progress Notes (Signed)
Anesthesia Chart Review: SAME DAY WORK-UP  Case: [id number] Date/Time: 01/04/22 1330   Procedure: BREAST REDUCTION WITH POSSIBLE FREE NIPPLE GRAFT (Bilateral: Breast)   Anesthesia type: General   Pre-op diagnosis: MACROMASTIA   Location: [facility] OR ROOM 09 / [facility]   Surgeons: Wallace Going, DO       DISCUSSION: Patient is a 62 year old female scheduled for the above procedure.  History includes former smoker (quit 07/31/96), HTN, HLD, valvular disease (MVP--not noted on 03/03/20 echo; functional bicuspid AV), ascending aorta dilation (3.8 cm 03/02/19 echo, but no dilatation on 01/27/20 CTA chest), pulmonary hypertension (RVSP 39.8 mmHg 03/02/19 echo), OSA (uses CPAP), DM2 (diet controlled), GERD, exertional dyspnea, migraines, spinal surgery (L5-S1 discectomy 04/19/06). False positive stress test in 2015, and no e evidence of CAD on 03/03/19 CCTA.   Cardiology preoperative risk assessment outlined on 12/26/21 by Rebekah Chesterfield, NP, "Ms. Tandy's perioperative risk of a major cardiac event is 0.9% according to the Revised Cardiac Risk Index (RCRI).  Therefore, she is at low risk for perioperative complications.   Her functional capacity is fair at 7.04 METs according to the Duke Activity Status Index (DASI). Recommendations: According to ACC/AHA guidelines, no further cardiovascular testing needed.  The patient may proceed to surgery at acceptable risk."  She had HTN follow-up with Isaac Bliss, Rayford Halsted, MD on 01/03/22. She had not been initially medically cleared at 12/07/21 visit due to uncontrolled HTN--she had not been taking medications appropriately, but has now been adherent for the past 3 weeks. BP 134/80 and 130/79. She was felt okay to proceed with surgery as planned. She is on losartan 100 mg daily, Lasix 20 mg daily, carvedilol 12.5 mg BID, and hydralazine 100 mg TID. She should take carvedilol and hydralazine on the morning of surgery.    She is a same-day workup, so anesthesia team to  evaluate on the day of surgery.  VS: Ht '5\' 11"'$  (1.803 m)   Wt 128.4 kg   LMP  (LMP Unknown)   BMI 39.47 kg/m  BP Readings from Last 3 Encounters:  01/03/22 130/79  12/26/21 134/86  12/14/21 (!) 162/89   Pulse Readings from Last 3 Encounters:  01/03/22 73  12/26/21 74  12/14/21 73     PROVIDERS: Isaac Bliss, Rayford Halsted, MD is PCP  Fransico Him, MD is cardiologist Metta Clines, DO is neurologist  LABS: For day of surgery as indicated. Last labs in [facility] include: Lab Results  Component Value Date   WBC 4.2 06/14/2021   HGB 11.3 (L) 06/14/2021   HCT 35.9 (L) 06/14/2021   PLT 184 06/14/2021   GLUCOSE 100 (H) 09/06/2021   ALT 16 09/06/2021   AST 14 09/06/2021   NA 141 09/06/2021   K 3.8 09/06/2021   CL 106 09/06/2021   CREATININE 0.86 09/06/2021   BUN 9 09/06/2021   CO2 29 09/06/2021   TSH 2.97 05/31/2021   HGBA1C 5.7 (A) 12/07/2021    Home Sleep Study 03/08/20: IMPRESSIONS - Severe obstructive sleep apnea occurred during this study (AHI = 36.0/h). - Severe oxygen desaturation was noted during this study (Min O2 = 78%). - Patient snored 0.2% during the sleep. RECOMMENDATIONS - Recomend in lab CPAP titration..  IMAGES: CXR 06/14/21: FINDINGS: Normal mediastinum and cardiac silhouette. Normal pulmonary vasculature. No evidence of effusion, infiltrate, or pneumothorax. No acute bony abnormality. Degenerative osteophytosis of the spine. IMPRESSION: No acute cardiopulmonary process.   EKG: 06/14/2021: Sinus bradycardia 59 bpm   CV: Echo 03/03/20: IMPRESSIONS  1. Left ventricular ejection fraction, by estimation, is 50 to 55%. Left  ventricular ejection fraction by 3D volume is 48 %. The left ventricle has  low normal function. The left ventricle has no regional wall motion  abnormalities. Left ventricular  diastolic parameters are indeterminate. Elevated left ventricular  end-diastolic pressure. The average left ventricular global longitudinal  strain is  -15.7 %. The global longitudinal strain is upper normal.   2. Right ventricular systolic function is normal. The right ventricular  size is normal. There is normal pulmonary artery systolic pressure. The  estimated right ventricular systolic pressure is 25.3 mmHg.   3. The mitral valve is normal in structure. No evidence of mitral valve  regurgitation. No evidence of mitral stenosis.   4. The AV is structurally trileaflet but functionally bileaflet with  calcificatoin and fusion of the right and left coronary cusps.. The aortic  valve is abnormal. Aortic valve regurgitation is not visualized. Mild  aortic valve sclerosis is present, with   no evidence of aortic valve stenosis.   5. The inferior vena cava is normal in size with greater than 50%  respiratory variability, suggesting right atrial pressure of 3 mmHg.    US Renal 03/02/20: Summary:  - Largest Aortic Diameter: 2.3 cm  - Renal:  Right: Normal size right kidney. Normal right Resistive Index.         Normal cortical thickness of right kidney. No evidence of         right renal artery stenosis. RRV flow present.  Left:  Normal size of left kidney. Normal left Resistive Index.         Normal cortical thickness of the left kidney. 1-59% stenosis         of the left renal artery. LRV flow present.  - Mesenteric:  Normal Celiac artery and Superior Mesenteric artery findings.  - Patent IVC.    Nuclear stress test 12/09/19: Nuclear stress EF: 68%. T wave inversion was noted during stress in the V4, V5 and V6 leads, and returning to baseline after less than 1 min of recovery. The study is normal. This is a low risk study. The left ventricular ejection fraction is normal (55-65%).    CT Coronary 03/03/19: IMPRESSION: 1. Coronary calcium score of 0. This was 0 percentile for age and sex matched control. 2. Normal coronary origin with right dominance. 3. No evidence of CAD.  CAD-RADS 0. 4. Study was limited by significant noise  artifact but no obvious calcified plaque. Suboptimal study for noncalcified plaque.   Cardiac event monitor 02/01/19-03/02/19: Sinus bradycardia, normal sinus rhythm and sinus tachycardia. The average heart rate was 73bpm and ranged from 50-121bpm    Cardiac cath 07/08/13: Coronary angiography: Coronary dominance: right - Left mainstem:  Normal - Left anterior descending (LAD):  Normal large artery that wraps the apex             D1:  Normal             D2: Normal             D3:  Small and normal - Left circumflex (LCx):  Normal and large             OM1:  Normal             OM2:  Normal - Right coronary artery (RCA):  Small  PDA territory more fed by wrap around LAD  Normal - Left ventriculography: Left ventricular systolic function is normal, LVEF is  estimated at 55-65%, there is no significant mitral regurgitation    Final Conclusions:   False Positive Myovue Recommendations:  Medical Rx Likely non cardiac chest pain    Past Medical History:  Diagnosis Date   Acquired dilation of ascending aorta and aortic root (HCC)    52m by 2D echo 03/2019   Allergy    Aortic valve disease    functionally bicuspid AV with fusion of the right and left coronary cusps with no AS by echo 03/2020   Arthritis    in back   Chest pain    neg cath 2016 after false positive myoview   Depression    Diabetes mellitus without complication (HCC)    type 2, no meds   Dyspnea    with exertion   GERD (gastroesophageal reflux disease)    Hyperglycemia    Hyperlipidemia    Hypertension    Joint pain    Knee pain    Low back pain    Migraine    Mitral valve prolapse    Muscle cramps    [place]    mild with PASP 363mg by echo 03/2019 but 2664m on echo 03/2020   Sleep apnea    uses cpap    Past Surgical History:  Procedure Laterality Date   BACK SURGERY     BREAST BIOPSY     CESAREAN SECTION     x 1   COLONOSCOPY     LEFT HEART CATHETERIZATION WITH CORONARY ANGIOGRAM N/A  07/08/2013   Procedure: LEFT HEART CATHETERIZATION WITH CORONARY ANGIOGRAM;  Surgeon: PetJosue HectorD;  [facility];  Service: Cardiovascular;  Laterality: N/A;   TUBAL LIGATION      MEDICATIONS: No current facility-administered medications for this encounter.    acetaminophen (TYLENOL) 500 MG tablet   Aromatic Inhalants (VICKS VAPOR INHALER IN)   atorvastatin (LIPITOR) 80 MG tablet   bismuth subsalicylate (PEPTO BISMOL) 262 MG/15ML suspension   Camphor-Eucalyptus-Menthol (VICKS VAPORUB EX)   carvedilol (COREG) 12.5 MG tablet   Dextromethorphan-GG-APAP (CORICIDIN HBP COLD/COUGH/FLU) 10-200-325 MG/15ML LIQD   furosemide (LASIX) 20 MG tablet   gabapentin (NEURONTIN) 300 MG capsule   hydrALAZINE (APRESOLINE) 100 MG tablet   losartan (COZAAR) 100 MG tablet   meloxicam (MOBIC) 15 MG tablet   ondansetron (ZOFRAN-ODT) 4 MG disintegrating tablet   rizatriptan (MAXALT-MLT) 10 MG disintegrating tablet   topiramate (TOPAMAX) 25 MG tablet   cephALEXin (KEFLEX) 500 MG capsule   DM-Doxylamine-Acetaminophen (CORICIDIN HBP NIGHTTIME COLD PO)   enoxaparin (LOVENOX) 40 MG/0.4ML injection   Multiple Vitamin (MULTIVITAMIN WITH MINERALS) TABS tablet   ondansetron (ZOFRAN) 4 MG tablet   oxyCODONE (ROXICODONE) 5 MG immediate release tablet   Lovenox ordered post-operatively.   AllMyra GianottiA-C Surgical Short Stay/Anesthesiology [facility] ([phone] ([phone]/2024 5:13 PM

## 2022-01-03 NOTE — Progress Notes (Signed)
Patient called to tell me she was cleared for surgery by her PCP at this afternoon's appt. Note is in EPIC at 1530.

## 2022-01-03 NOTE — Telephone Encounter (Signed)
Pt called to state that Dr. Jerilee Hoh has agreed for her to proceed with her surgery and that his office will call here today to verify.

## 2022-01-03 NOTE — Anesthesia Preprocedure Evaluation (Addendum)
Anesthesia Evaluation  Patient identified by MRN, date of birth, ID band Patient awake    Reviewed: Allergy & Precautions, NPO status , Patient's Chart, lab work & pertinent test results  History of Anesthesia Complications Negative for: history of anesthetic complications  Airway Mallampati: II  TM Distance: >3 FB Neck ROM: Full    Dental  (+) Dental Advisory Given   Pulmonary shortness of breath and with exertion, sleep apnea and Continuous Positive Airway Pressure Ventilation , Recent URI , Residual Cough, former smoker   Pulmonary exam normal breath sounds clear to auscultation       Cardiovascular hypertension (carvedilol, furosemide, hydralazine, losartan), Pt. on home beta blockers and Pt. on medications pulmonary hypertension (mild (normal on last echo))(-) angina +CHF (diastolic)  (-) Past MI, (-) Cardiac Stents and (-) CABG (-) dysrhythmias + Valvular Problems/Murmurs (functionally bicuspid AV) MVP  Rhythm:Regular Rate:Normal  HLD  TTE 03/03/2020: IMPRESSIONS     1. Left ventricular ejection fraction, by estimation, is 50 to 55%. Left  ventricular ejection fraction by 3D volume is 48 %. The left ventricle has  low normal function. The left ventricle has no regional wall motion  abnormalities. Left ventricular  diastolic parameters are indeterminate. Elevated left ventricular  end-diastolic pressure. The average left ventricular global longitudinal  strain is -15.7 %. The global longitudinal strain is upper normal.   2. Right ventricular systolic function is normal. The right ventricular  size is normal. There is normal pulmonary artery systolic pressure. The  estimated right ventricular systolic pressure is 99.2 mmHg.   3. The mitral valve is normal in structure. No evidence of mitral valve  regurgitation. No evidence of mitral stenosis.   4. The AV is structurally trileaflet but functionally bileaflet with   calcificatoin and fusion of the right and left coronary cusps.. The aortic  valve is abnormal. Aortic valve regurgitation is not visualized. Mild  aortic valve sclerosis is present, with   no evidence of aortic valve stenosis.   5. The inferior vena cava is normal in size with greater than 50%  respiratory variability, suggesting right atrial pressure of 3 mmHg.   Nuclear Stress Test 12/08/2019:  Nuclear stress EF: 68%.  T wave inversion was noted during stress in the V4, V5 and V6 leads, and returning to baseline after less than 1 min of recovery.  The study is normal.  This is a low risk study.  The left ventricular ejection fraction is normal (55-65%).    Neuro/Psych  Headaches PSYCHIATRIC DISORDERS  Depression     Neuromuscular disease (chronic back pain)    GI/Hepatic Neg liver ROS,GERD  ,,  Endo/Other  diabetes (Hgb A1c 5.7), Well Controlled, Type 2  Morbid obesity  Renal/GU negative Renal ROS     Musculoskeletal  (+) Arthritis ,    Abdominal  (+) + obese  Peds  Hematology negative hematology ROS (+)   Anesthesia Other Findings   Reproductive/Obstetrics                             Anesthesia Physical Anesthesia Plan  ASA: 3  Anesthesia Plan: General   Post-op Pain Management:    Induction: Intravenous  PONV Risk Score and Plan: 3 and Ondansetron, Dexamethasone and Treatment may vary due to age or medical condition  Airway Management Planned: Oral ETT  Additional Equipment:   Intra-op Plan:   Post-operative Plan: Extubation in OR  Informed Consent: I have reviewed the patients  History and Physical, chart, labs and discussed the procedure including the risks, benefits and alternatives for the proposed anesthesia with the patient or authorized representative who has indicated his/her understanding and acceptance.     Dental advisory given  Plan Discussed with: CRNA and Anesthesiologist  Anesthesia Plan Comments:  (Risks of general anesthesia discussed including, but not limited to, sore throat, hoarse voice, chipped/damaged teeth, injury to vocal cords, nausea and vomiting, allergic reactions, lung infection, heart attack, stroke, and death. All questions answered.   PAT note written 01/03/2022 by Myra Gianotti, PA-C.  )       Anesthesia Quick Evaluation

## 2022-01-03 NOTE — Telephone Encounter (Signed)
Sent message to [facility]

## 2022-01-04 ENCOUNTER — Ambulatory Visit (HOSPITAL_COMMUNITY)
Admission: RE | Admit: 2022-01-04 | Discharge: 2022-01-04 | Disposition: A | Discharge status: Home / Self Care | Payer: BC Managed Care – PPO | Attending: Plastic Surgery | Admitting: Plastic Surgery

## 2022-01-04 ENCOUNTER — Other Ambulatory Visit: Payer: Self-pay

## 2022-01-04 ENCOUNTER — Encounter (HOSPITAL_COMMUNITY)
Admission: RE | Disposition: A | Discharge status: Home / Self Care | Payer: Self-pay | Source: Home / Self Care | Attending: Plastic Surgery

## 2022-01-04 ENCOUNTER — Ambulatory Visit (HOSPITAL_COMMUNITY): Payer: BC Managed Care – PPO | Admitting: Vascular Surgery

## 2022-01-04 ENCOUNTER — Encounter (HOSPITAL_COMMUNITY): Payer: Self-pay | Admitting: Plastic Surgery

## 2022-01-04 DIAGNOSIS — Z87891 Personal history of nicotine dependence: Secondary | ICD-10-CM | POA: Diagnosis not present

## 2022-01-04 DIAGNOSIS — E119 Type 2 diabetes mellitus without complications: Secondary | ICD-10-CM | POA: Insufficient documentation

## 2022-01-04 DIAGNOSIS — I272 Pulmonary hypertension, unspecified: Secondary | ICD-10-CM | POA: Diagnosis not present

## 2022-01-04 DIAGNOSIS — G709 Myoneural disorder, unspecified: Secondary | ICD-10-CM | POA: Diagnosis not present

## 2022-01-04 DIAGNOSIS — M1990 Unspecified osteoarthritis, unspecified site: Secondary | ICD-10-CM | POA: Diagnosis not present

## 2022-01-04 DIAGNOSIS — N6011 Diffuse cystic mastopathy of right breast: Secondary | ICD-10-CM | POA: Diagnosis not present

## 2022-01-04 DIAGNOSIS — N62 Hypertrophy of breast: Secondary | ICD-10-CM | POA: Diagnosis not present

## 2022-01-04 DIAGNOSIS — I509 Heart failure, unspecified: Secondary | ICD-10-CM | POA: Diagnosis not present

## 2022-01-04 DIAGNOSIS — Z6839 Body mass index (BMI) 39.0-39.9, adult: Secondary | ICD-10-CM | POA: Insufficient documentation

## 2022-01-04 DIAGNOSIS — I341 Nonrheumatic mitral (valve) prolapse: Secondary | ICD-10-CM | POA: Insufficient documentation

## 2022-01-04 DIAGNOSIS — M542 Cervicalgia: Secondary | ICD-10-CM | POA: Diagnosis not present

## 2022-01-04 DIAGNOSIS — I5030 Unspecified diastolic (congestive) heart failure: Secondary | ICD-10-CM | POA: Diagnosis not present

## 2022-01-04 DIAGNOSIS — Z79899 Other long term (current) drug therapy: Secondary | ICD-10-CM | POA: Insufficient documentation

## 2022-01-04 DIAGNOSIS — K219 Gastro-esophageal reflux disease without esophagitis: Secondary | ICD-10-CM | POA: Insufficient documentation

## 2022-01-04 DIAGNOSIS — M549 Dorsalgia, unspecified: Secondary | ICD-10-CM | POA: Diagnosis not present

## 2022-01-04 DIAGNOSIS — N6012 Diffuse cystic mastopathy of left breast: Secondary | ICD-10-CM | POA: Diagnosis not present

## 2022-01-04 DIAGNOSIS — G4730 Sleep apnea, unspecified: Secondary | ICD-10-CM | POA: Insufficient documentation

## 2022-01-04 DIAGNOSIS — I110 Hypertensive heart disease with heart failure: Secondary | ICD-10-CM | POA: Insufficient documentation

## 2022-01-04 HISTORY — DX: Sleep apnea, unspecified: G47.30

## 2022-01-04 HISTORY — DX: Dyspnea, unspecified: R06.00

## 2022-01-04 HISTORY — PX: BREAST REDUCTION SURGERY: SHX8

## 2022-01-04 LAB — BASIC METABOLIC PANEL
Anion gap: 8 (ref 5–15)
BUN: 12 mg/dL (ref 6–20)
CO2: 22 mmol/L (ref 22–32)
Calcium: 9.1 mg/dL (ref 8.9–10.3)
Chloride: 110 mmol/L (ref 98–111)
Creatinine, Ser: 0.9 mg/dL (ref 0.44–1.00)
GFR, Estimated: >60 mL/min (ref >60)
Glucose, Bld: 89 mg/dL (ref 70–99)
Potassium: 4 mmol/L (ref 3.5–5.1)
Sodium: 140 mmol/L (ref 135–145)

## 2022-01-04 LAB — CBC
HCT: 39.2 % (ref 36.0–46.0)
Hemoglobin: 12.7 g/dL (ref 12.0–15.0)
MCH: 30.4 pg (ref 26.0–34.0)
MCHC: 32.4 g/dL (ref 30.0–36.0)
MCV: 93.8 fL (ref 80.0–100.0)
Platelets: 217 10*3/uL (ref 150–400)
RBC: 4.18 MIL/uL (ref 3.87–5.11)
RDW: 15.9 % — ABNORMAL HIGH (ref 11.5–15.5)
WBC: 4.7 10*3/uL (ref 4.0–10.5)
nRBC: 0 % (ref 0.0–0.2)

## 2022-01-04 LAB — GLUCOSE, CAPILLARY: Glucose-Capillary: 103 mg/dL — ABNORMAL HIGH (ref 70–99)

## 2022-01-04 SURGERY — BREAST REDUCTION WITH LIPOSUCTION
Anesthesia: General | Site: Breast | Laterality: Bilateral

## 2022-01-04 MED ORDER — ACETAMINOPHEN 500 MG PO TABS
1000.0000 mg | ORAL_TABLET | Freq: Once | ORAL | Status: AC
  Administered 2022-01-04: 1000 mg via ORAL
  Filled 2022-01-04: qty 2

## 2022-01-04 MED ORDER — LACTATED RINGERS IV SOLN: INTRAVENOUS | Status: DC

## 2022-01-04 MED ORDER — DEXAMETHASONE SODIUM PHOSPHATE 10 MG/ML IJ SOLN
INTRAMUSCULAR | Status: AC
  Filled 2022-01-04: qty 1

## 2022-01-04 MED ORDER — FENTANYL CITRATE (PF) 250 MCG/5ML IJ SOLN
INTRAMUSCULAR | Status: AC
  Filled 2022-01-04: qty 5

## 2022-01-04 MED ORDER — SODIUM CHLORIDE 0.9% FLUSH: 3.0000 mL | Freq: Two times a day (BID) | INTRAVENOUS | Status: DC

## 2022-01-04 MED ORDER — EPHEDRINE 5 MG/ML INJ
INTRAVENOUS | Status: AC
  Filled 2022-01-04: qty 5

## 2022-01-04 MED ORDER — LIDOCAINE-EPINEPHRINE 1 %-1:100000 IJ SOLN
INTRAMUSCULAR | Status: AC
  Filled 2022-01-04: qty 1

## 2022-01-04 MED ORDER — PHENYLEPHRINE 80 MCG/ML (10ML) SYRINGE FOR IV PUSH (FOR BLOOD PRESSURE SUPPORT)
PREFILLED_SYRINGE | INTRAVENOUS | Status: DC | PRN
  Administered 2022-01-04: 80 ug via INTRAVENOUS

## 2022-01-04 MED ORDER — OXYCODONE HCL 5 MG PO TABS
ORAL_TABLET | ORAL | Status: AC
  Filled 2022-01-04: qty 1

## 2022-01-04 MED ORDER — PROPOFOL 10 MG/ML IV BOLUS
INTRAVENOUS | Status: AC
  Filled 2022-01-04: qty 20

## 2022-01-04 MED ORDER — ROCURONIUM BROMIDE 10 MG/ML (PF) SYRINGE
PREFILLED_SYRINGE | INTRAVENOUS | Status: DC | PRN
  Administered 2022-01-04: 50 mg via INTRAVENOUS
  Administered 2022-01-04: 10 mg via INTRAVENOUS
  Administered 2022-01-04 (×2): 20 mg via INTRAVENOUS

## 2022-01-04 MED ORDER — BUPIVACAINE LIPOSOME 1.3 % IJ SUSP
INTRAMUSCULAR | Status: AC
  Filled 2022-01-04: qty 20

## 2022-01-04 MED ORDER — SODIUM CHLORIDE 0.9 % IV SOLN: 250.0000 mL | INTRAVENOUS | Status: DC | PRN

## 2022-01-04 MED ORDER — SODIUM CHLORIDE (PF) 0.9 % IJ SOLN
INTRAMUSCULAR | Status: AC
  Filled 2022-01-04: qty 20

## 2022-01-04 MED ORDER — EPHEDRINE SULFATE-NACL 50-0.9 MG/10ML-% IV SOSY
PREFILLED_SYRINGE | INTRAVENOUS | Status: DC | PRN
  Administered 2022-01-04 (×3): 10 mg via INTRAVENOUS

## 2022-01-04 MED ORDER — ONDANSETRON HCL 4 MG/2ML IJ SOLN
INTRAMUSCULAR | Status: DC | PRN
  Administered 2022-01-04: 4 mg via INTRAVENOUS

## 2022-01-04 MED ORDER — CEFAZOLIN IN SODIUM CHLORIDE 3-0.9 GM/100ML-% IV SOLN
3.0000 g | INTRAVENOUS | Status: AC
  Administered 2022-01-04: 3 g via INTRAVENOUS
  Filled 2022-01-04: qty 100

## 2022-01-04 MED ORDER — PROMETHAZINE HCL 25 MG/ML IJ SOLN: 6.2500 mg | INTRAMUSCULAR | Status: DC | PRN

## 2022-01-04 MED ORDER — HYDROMORPHONE HCL 1 MG/ML IJ SOLN
INTRAMUSCULAR | Status: AC
  Filled 2022-01-04: qty 1

## 2022-01-04 MED ORDER — PROPOFOL 10 MG/ML IV BOLUS
INTRAVENOUS | Status: DC | PRN
  Administered 2022-01-04: 200 mg via INTRAVENOUS
  Administered 2022-01-04: 50 mg via INTRAVENOUS

## 2022-01-04 MED ORDER — LIDOCAINE 2% (20 MG/ML) 5 ML SYRINGE
INTRAMUSCULAR | Status: DC | PRN
  Administered 2022-01-04: 200 mg via INTRAVENOUS

## 2022-01-04 MED ORDER — ORAL CARE MOUTH RINSE: 15.0000 mL | Freq: Once | OROMUCOSAL | Status: AC

## 2022-01-04 MED ORDER — OXYCODONE HCL 5 MG PO TABS: 5.0000 mg | ORAL_TABLET | ORAL | Status: DC | PRN

## 2022-01-04 MED ORDER — ONDANSETRON HCL 4 MG/2ML IJ SOLN
INTRAMUSCULAR | Status: AC
  Filled 2022-01-04: qty 4

## 2022-01-04 MED ORDER — ONDANSETRON HCL 4 MG/2ML IJ SOLN
INTRAMUSCULAR | Status: AC
  Filled 2022-01-04: qty 2

## 2022-01-04 MED ORDER — MIDAZOLAM HCL 2 MG/2ML IJ SOLN
INTRAMUSCULAR | Status: AC
  Filled 2022-01-04: qty 2

## 2022-01-04 MED ORDER — BUPIVACAINE LIPOSOME 1.3 % IJ SUSP
INTRAMUSCULAR | Status: DC | PRN
  Administered 2022-01-04: 20 mL

## 2022-01-04 MED ORDER — CHLORHEXIDINE GLUCONATE 0.12 % MT SOLN: 15.0000 mL | Freq: Once | OROMUCOSAL | Status: AC

## 2022-01-04 MED ORDER — HYDROMORPHONE HCL 1 MG/ML IJ SOLN
0.2500 mg | INTRAMUSCULAR | Status: DC | PRN
  Administered 2022-01-04 (×4): 0.5 mg via INTRAVENOUS

## 2022-01-04 MED ORDER — CHLORHEXIDINE GLUCONATE 0.12 % MT SOLN
OROMUCOSAL | Status: AC
  Administered 2022-01-04: 15 mL via OROMUCOSAL
  Filled 2022-01-04: qty 15

## 2022-01-04 MED ORDER — SODIUM CHLORIDE 0.9% FLUSH: 3.0000 mL | INTRAVENOUS | Status: DC | PRN

## 2022-01-04 MED ORDER — ROCURONIUM BROMIDE 10 MG/ML (PF) SYRINGE
PREFILLED_SYRINGE | INTRAVENOUS | Status: AC
  Filled 2022-01-04: qty 20

## 2022-01-04 MED ORDER — SODIUM CHLORIDE 0.9 % IV SOLN
INTRAVENOUS | Status: DC | PRN
  Administered 2022-01-04: 500 mL

## 2022-01-04 MED ORDER — LIDOCAINE-EPINEPHRINE 1 %-1:100000 IJ SOLN
INTRAMUSCULAR | Status: DC | PRN
  Administered 2022-01-04: 40 mL

## 2022-01-04 MED ORDER — LIDOCAINE 2% (20 MG/ML) 5 ML SYRINGE
INTRAMUSCULAR | Status: AC
  Filled 2022-01-04: qty 10

## 2022-01-04 MED ORDER — OXYCODONE HCL 5 MG PO TABS
5.0000 mg | ORAL_TABLET | Freq: Once | ORAL | Status: AC | PRN
  Administered 2022-01-04: 5 mg via ORAL

## 2022-01-04 MED ORDER — MIDAZOLAM HCL 2 MG/2ML IJ SOLN
INTRAMUSCULAR | Status: DC | PRN
  Administered 2022-01-04: 2 mg via INTRAVENOUS

## 2022-01-04 MED ORDER — CHLORHEXIDINE GLUCONATE CLOTH 2 % EX PADS
6.0000 | MEDICATED_PAD | Freq: Once | CUTANEOUS | Status: AC
  Administered 2022-01-04: 6 via TOPICAL

## 2022-01-04 MED ORDER — DIPHENHYDRAMINE HCL 50 MG/ML IJ SOLN
INTRAMUSCULAR | Status: DC | PRN
  Administered 2022-01-04: 12.5 mg via INTRAVENOUS

## 2022-01-04 MED ORDER — PHENYLEPHRINE 80 MCG/ML (10ML) SYRINGE FOR IV PUSH (FOR BLOOD PRESSURE SUPPORT)
PREFILLED_SYRINGE | INTRAVENOUS | Status: AC
  Filled 2022-01-04: qty 20

## 2022-01-04 MED ORDER — BUPIVACAINE HCL (PF) 0.25 % IJ SOLN
INTRAMUSCULAR | Status: AC
  Filled 2022-01-04: qty 30

## 2022-01-04 MED ORDER — ACETAMINOPHEN 325 MG PO TABS: 650.0000 mg | ORAL_TABLET | ORAL | Status: DC | PRN

## 2022-01-04 MED ORDER — DIPHENHYDRAMINE HCL 50 MG/ML IJ SOLN
INTRAMUSCULAR | Status: AC
  Filled 2022-01-04: qty 1

## 2022-01-04 MED ORDER — OXYCODONE HCL 5 MG/5ML PO SOLN: 5.0000 mg | Freq: Once | ORAL | Status: AC | PRN

## 2022-01-04 MED ORDER — FENTANYL CITRATE (PF) 250 MCG/5ML IJ SOLN
INTRAMUSCULAR | Status: DC | PRN
  Administered 2022-01-04 (×2): 100 ug via INTRAVENOUS
  Administered 2022-01-04 (×3): 50 ug via INTRAVENOUS

## 2022-01-04 MED ORDER — LIDOCAINE HCL (PF) 1 % IJ SOLN
INTRAMUSCULAR | Status: AC
  Filled 2022-01-04: qty 30

## 2022-01-04 MED ORDER — SUGAMMADEX SODIUM 200 MG/2ML IV SOLN
INTRAVENOUS | Status: DC | PRN
  Administered 2022-01-04: 254 mg via INTRAVENOUS

## 2022-01-04 MED ORDER — ACETAMINOPHEN 650 MG RE SUPP: 650.0000 mg | RECTAL | Status: DC | PRN

## 2022-01-04 MED ORDER — FENTANYL CITRATE (PF) 100 MCG/2ML IJ SOLN: 25.0000 ug | INTRAMUSCULAR | Status: DC | PRN

## 2022-01-04 MED ORDER — KETOROLAC TROMETHAMINE 30 MG/ML IJ SOLN
INTRAMUSCULAR | Status: AC
  Filled 2022-01-04: qty 1

## 2022-01-04 SURGICAL SUPPLY — 60 items
ADH SKN CLS LQ APL DERMABOND (GAUZE/BANDAGES/DRESSINGS) ×1
AGENT HMST PWDR BTL CLGN 5GM (Miscellaneous) ×1 IMPLANT
BAG COUNTER SPONGE SURGICOUNT (BAG) ×1 IMPLANT
BAG DECANTER FOR FLEXI CONT (MISCELLANEOUS) IMPLANT
BINDER BREAST BLACK XXL (GAUZE/BANDAGES/DRESSINGS) IMPLANT
BINDER BREAST LRG (GAUZE/BANDAGES/DRESSINGS) IMPLANT
BINDER BREAST MEDIUM (GAUZE/BANDAGES/DRESSINGS) IMPLANT
BINDER BREAST XLRG (GAUZE/BANDAGES/DRESSINGS) IMPLANT
BINDER BREAST XXLRG (GAUZE/BANDAGES/DRESSINGS) IMPLANT
BIOPATCH RED 1 DISK 7.0 (GAUZE/BANDAGES/DRESSINGS) IMPLANT
BLADE SURG 10 STRL SS (BLADE) ×1 IMPLANT
BNDG GAUZE DERMACEA FLUFF 4 (GAUZE/BANDAGES/DRESSINGS) IMPLANT
BNDG GZE DERMACEA 4 6PLY (GAUZE/BANDAGES/DRESSINGS)
CANISTER SUCT 3000ML PPV (MISCELLANEOUS) ×1 IMPLANT
CHLORAPREP W/TINT 26 (MISCELLANEOUS) ×1 IMPLANT
COLLAGEN CELLERATERX 5 GRAM (Miscellaneous) IMPLANT
DERMABOND ADVANCED .7 DNX12 (GAUZE/BANDAGES/DRESSINGS) ×3 IMPLANT
DERMABOND ADVANCED .7 DNX6 (GAUZE/BANDAGES/DRESSINGS) IMPLANT
DRAIN CHANNEL 19F RND (DRAIN) IMPLANT
DRAPE ORTHO SPLIT 77X108 STRL (DRAPES) ×2
DRAPE SURG ORHT 6 SPLT 77X108 (DRAPES) ×2 IMPLANT
DRSG MEPILEX POST OP 4X8 (GAUZE/BANDAGES/DRESSINGS) ×2 IMPLANT
ELECT BLADE 4.0 EZ CLEAN MEGAD (MISCELLANEOUS) ×1
ELECT CAUTERY BLADE 6.4 (BLADE) ×1 IMPLANT
ELECT REM PT RETURN 9FT ADLT (ELECTROSURGICAL) ×1
ELECTRODE BLDE 4.0 EZ CLN MEGD (MISCELLANEOUS) ×1 IMPLANT
ELECTRODE REM PT RTRN 9FT ADLT (ELECTROSURGICAL) ×1 IMPLANT
EVACUATOR SILICONE 100CC (DRAIN) IMPLANT
GAUZE PAD ABD 7.5X8 STRL (GAUZE/BANDAGES/DRESSINGS) IMPLANT
GAUZE PAD ABD 8X10 STRL (GAUZE/BANDAGES/DRESSINGS) ×2 IMPLANT
GLOVE BIO SURGEON STRL SZ 6.5 (GLOVE) ×2 IMPLANT
GLOVE SURG ENC TEXT LTX SZ7.5 (GLOVE) IMPLANT
GOWN STRL REUS W/ TWL LRG LVL3 (GOWN DISPOSABLE) ×2 IMPLANT
GOWN STRL REUS W/TWL LRG LVL3 (GOWN DISPOSABLE) ×2
MARKER SKIN DUAL TIP RULER LAB (MISCELLANEOUS) ×1 IMPLANT
NDL HYPO 25GX1X1/2 BEV (NEEDLE) ×1 IMPLANT
NEEDLE HYPO 25GX1X1/2 BEV (NEEDLE) ×1 IMPLANT
NS IRRIG 1000ML POUR BTL (IV SOLUTION) ×1 IMPLANT
PACK GENERAL/GYN (CUSTOM PROCEDURE TRAY) ×1 IMPLANT
SPIKE FLUID TRANSFER (MISCELLANEOUS) IMPLANT
SPONGE T-LAP 18X18 ~~LOC~~+RFID (SPONGE) ×2 IMPLANT
STAPLER VISISTAT 35W (STAPLE) IMPLANT
STRIP CLOSURE SKIN 1/2X4 (GAUZE/BANDAGES/DRESSINGS) ×2 IMPLANT
SUT MNCRL AB 3-0 PS2 27 (SUTURE) IMPLANT
SUT MON AB 3-0 SH 27 (SUTURE) ×4
SUT MON AB 3-0 SH27 (SUTURE) IMPLANT
SUT MON AB 4-0 PS1 27 (SUTURE) IMPLANT
SUT MON AB 5-0 PS2 18 (SUTURE) IMPLANT
SUT PDS AB 2-0 CT2 27 (SUTURE) IMPLANT
SUT PDS AB 3-0 SH 27 (SUTURE) IMPLANT
SUT SILK 3 0 (SUTURE) ×2
SUT SILK 3 0 PS 1 (SUTURE) IMPLANT
SUT SILK 3-0 FS1 18XBRD (SUTURE) IMPLANT
SUT VIC AB 3-0 SH 27 (SUTURE)
SUT VIC AB 3-0 SH 27X BRD (SUTURE) IMPLANT
SUT VICRYL 4-0 PS2 18IN ABS (SUTURE) IMPLANT
SYR 50ML LL SCALE MARK (SYRINGE) IMPLANT
SYR CONTROL 10ML LL (SYRINGE) ×1 IMPLANT
TAPE STRIPS DRAPE STRL (GAUZE/BANDAGES/DRESSINGS) IMPLANT
TOWEL GREEN STERILE FF (TOWEL DISPOSABLE) ×2 IMPLANT

## 2022-01-04 NOTE — Interval H&P Note (Signed)
History and Physical Interval Note:  01/04/2022 1:44 PM  Jamie [name]  has presented today for surgery, with the diagnosis of MACROMASTIA.  The various methods of treatment have been discussed with the patient and family. After consideration of risks, benefits and other options for treatment, the patient has consented to  Procedure(s): BREAST REDUCTION WITH POSSIBLE FREE NIPPLE GRAFT (Bilateral) as a surgical intervention.  The patient's history has been reviewed, patient examined, no change in status, stable for surgery.  I have reviewed the patient's chart and labs.  Questions were answered to the patient's satisfaction.     Loel Lofty Amoni Scallan

## 2022-01-04 NOTE — Discharge Instructions (Signed)
INSTRUCTIONS FOR AFTER BREAST SURGERY   You will likely have some questions about what to expect following your operation.  The following information will help you and your family understand what to expect when you are discharged from the hospital.  It is important to follow these guidelines to help ensure a smooth recovery and reduce complication.  Postoperative instructions include information on: diet, wound care, medications and physical activity.  AFTER SURGERY Expect to go home after the procedure.  In some cases, you may need to spend one night in the hospital for observation.  DIET Breast surgery does not require a specific diet.  However, the healthier you eat the better your body will heal. It is important to increasing your protein intake.  This means limiting the foods with sugar and carbohydrates.  Focus on vegetables and some meat.  If you have liposuction during your procedure be sure to drink water.  If your urine is bright yellow, then it is concentrated, and you need to drink more water.  As a general rule after surgery, you should have 8 ounces of water every hour while awake.  If you find you are persistently nauseated or unable to take in liquids let us know.  NO TOBACCO USE or EXPOSURE.  This will slow your healing process and lead to a wound.  WOUND CARE Leave the binder on for 3 days . Use fragrance free soap like Dial, Dove or Ivory.   After 3 days you can remove the binder to shower. Once dry apply binder or sports bra. If you have liposuction you will have a soft and spongy dressing (Lipofoam) that helps prevent creases in your skin.  Remove before you shower and then replace it.  It is also available on [employer]. If you have steri-strips / tape directly attached to your skin leave them in place. It is OK to get these wet.   No baths, pools or hot tubs for four weeks. We close your incision to leave the smallest and best-looking scar. No ointment or creams on your incisions  for four weeks.  No Neosporin (Too many skin reactions).  A few weeks after surgery you can use Mederma and start massaging the scar. We ask you to wear your binder or sports bra for the first 6 weeks around the clock, including while sleeping. This provides added comfort and helps reduce the fluid accumulation at the surgery site. NO Ice or heating pads to the operative site.  You have a very high risk of a BURN before you feel the temperature change.  ACTIVITY No heavy lifting until cleared by the doctor.  This usually means no more than a half-gallon of milk.  It is OK to walk and climb stairs. Moving your legs is very important to decrease your risk of a blood clot.  It will also help keep you from getting deconditioned.  Every 1 to 2 hours get up and walk for 5 minutes. This will help with a quicker recovery back to normal.  Let pain be your guide so you don't do too much.  This time is for you to recover.  You will be more comfortable if you sleep and rest with your head elevated either with a few pillows under you or in a recliner.  No stomach sleeping for a three months.  WORK Everyone returns to work at different times. As a rough guide, most people take at least 1 - 2 weeks off prior to returning to work. If   you need documentation for your job, give the forms to the front staff at the clinic.  DRIVING Arrange for someone to bring you home from the hospital after your surgery.  You may be able to drive a few days after surgery but not while taking any narcotics or valium.  BOWEL MOVEMENTS Constipation can occur after anesthesia and while taking pain medication.  It is important to stay ahead for your comfort.  We recommend taking Milk of Magnesia (2 tablespoons; twice a day) while taking the pain pills.  MEDICATIONS You may be prescribed should start after surgery At your preoperative visit for you history and physical you may have been given the following medications: An antibiotic: Start  this medication when you get home and take according to the instructions on the bottle. Zofran 4 mg:  This is to treat nausea and vomiting.  You can take this every 6 hours as needed and only if needed. Valium 2 mg for breast cancer patients: This is for muscle tightness if you have an implant or expander. This will help relax your muscle which also helps with pain control.  This can be taken every 12 hours as needed. Don't drive after taking this medication. Norco (hydrocodone/acetaminophen) 5/325 mg:  This is only to be used after you have taken the Motrin or the Tylenol. Every 8 hours as needed.   Over the counter Medication to take: Ibuprofen (Motrin) 600 mg:  Take this every 6 hours.  If you have additional pain then take 500 mg of the Tylenol every 8 hours.  Only take the Norco after you have tried these two. MiraLAX or Milk of Magnesia: Take this according to the bottle if you take the Norco.  WHEN TO CALL Call your surgeon's office if any of the following occur: Fever 101 degrees F or greater Excessive bleeding or fluid from the incision site. Pain that increases over time without aid from the medications Redness, warmth, or pus draining from incision sites Persistent nausea or inability to take in liquids Severe misshapen area that underwent the operation. About my Jackson-Pratt Bulb Drain  What is a Jackson-Pratt bulb? A Jackson-Pratt is a soft, round device used to collect drainage. It is connected to a long, thin drainage catheter, which is held in place by one or two small stiches near your surgical incision site. When the bulb is squeezed, it forms a vacuum, forcing the drainage to empty into the bulb.  Emptying the Jackson-Pratt bulb- To empty the bulb: 1. Release the plug on the top of the bulb. 2. Pour the bulb's contents into a measuring container which your nurse will provide. 3. Record the time emptied and amount of drainage. Empty the drain(s) as often as your      doctor or nurse recommends.  Date                  Time                    Amount (Drain 1)                 Amount (Drain 2)  _____________________________________________________________________  _____________________________________________________________________  _____________________________________________________________________  _____________________________________________________________________  _____________________________________________________________________  _____________________________________________________________________  _____________________________________________________________________  _____________________________________________________________________  Squeezing the Jackson-Pratt Bulb- To squeeze the bulb: 1. Make sure the plug at the top of the bulb is open. 2. Squeeze the bulb tightly in your fist. You will hear air squeezing from the bulb. 3. Replace the plug while the bulb

## 2022-01-04 NOTE — Op Note (Addendum)
Breast Reduction Op note:    DATE OF PROCEDURE: 01/04/2022  LOCATION: [facility]  SURGEON: Audelia Hives, DO  ASSISTANT: Dr. Jeanann Lewandowsky  PREOPERATIVE DIAGNOSIS 1. Macromastia 2. Neck Pain 3. Back Pain  POSTOPERATIVE DIAGNOSIS 1. Macromastia 2. Neck Pain 3. Back Pain  PROCEDURES 1. Bilateral breast reduction.  Right reduction 1680 g, Left reduction 0093 g  COMPLICATIONS: None.  DRAINS: bilateral  INDICATIONS FOR PROCEDURE Jamie [name] is a 62 y.o. year-old female born on 11-Sep-1960,with a history of symptomatic macromastia with concominant back pain, neck pain, shoulder grooving from her bra.   MRN: [id number]  CONSENT Informed consent was obtained directly from the patient. The risks, benefits and alternatives were fully discussed. Specific risks including but not limited to bleeding, infection, hematoma, seroma, scarring, pain, nipple necrosis, asymmetry, poor cosmetic results, and need for further surgery were discussed. The patient had ample opportunity to have her questions answered to her satisfaction.  DESCRIPTION OF PROCEDURE  Patient was brought into the operating room and placed in a supine position.  SCDs were placed and appropriate padding was performed.  Antibiotics were given. The patient underwent general anesthesia and the chest was prepped and draped in a sterile fashion.  A timeout was performed and all information was confirmed to be correct.  Right side: Preoperative markings were confirmed.  Incision lines were injected with local with epinephrine.  After waiting for vasoconstriction, the marked lines were incised.  A Wise-pattern superomedial breast reduction was performed by de-epithelializing the pedicle, using bovie to create the superomedial pedicle, and removing breast tissue from the superior, lateral, and inferior portions of the breast.  Care was taken to not undermine the breast pedicle. Hemostasis was achieved.  The nipple was  gently rotated into position and the soft tissue closed with 4-0 Monocryl.  Experel and cellerate were placed. The pocket was irrigated and hemostasis confirmed.  The deep tissues were approximated with 3-0 PDS sutures and the skin was closed with deep dermal 3-0 Monocryl and subcuticular 4-0 Monocryl sutures.  The nipple and skin flaps had good capillary refill at the end of the procedure.    Left side: Preoperative markings were confirmed.  Incision lines were injected with local with epinephrine.  After waiting for vasoconstriction, the marked lines were incised.  A Wise-pattern superomedial breast reduction was performed by de-epithelializing the pedicle, using bovie to create the superomedial pedicle, and removing breast tissue from the superior, lateral, and inferior portions of the breast.  Care was taken to not undermine the breast pedicle. Hemostasis was achieved.  The nipple was gently rotated into position and the soft tissue was closed with 4-0 Monocryl.  Experel and Cellerate were placed. The patient was sat upright and size and shape symmetry was confirmed.  The pocket was irrigated and hemostasis confirmed.  The deep tissues were approximated with 3-0 PDS sutures and the skin was closed with deep dermal 3-0 Monocryl and subcuticular 4-0 Monocryl sutures.  Dermabond was applied.  A breast binder and ABDs were placed.  The nipple and skin flaps had good capillary refill at the end of the procedure.  The patient tolerated the procedure well. The patient was allowed to wake from anesthesia and taken to the recovery room in satisfactory condition.  Dr. Lovena Le assisted throughout the case.  He was essential in retraction and counter traction when needed to make the case progress smoothly.  This retraction and assistance made it possible to see the tissue plans for the procedure.  The assistance was needed for blood control, tissue re-approximation and assisted with closure of the incision site.

## 2022-01-04 NOTE — Transfer of Care (Signed)
Immediate Anesthesia Transfer of Care Note  Patient: Jamie [name]  Procedure(s) Performed: BILATERAL BREAST REDUCTION (Bilateral: Breast)  Patient Location: PACU  Anesthesia Type:General  Level of Consciousness: awake, alert , and oriented  Airway & Oxygen Therapy: Patient Spontanous Breathing  Post-op Assessment: Report given to RN and Post -op Vital signs reviewed and stable  Post vital signs: Reviewed and stable  Last Vitals:  Vitals Value Taken Time  BP 146/82 01/04/22 1703  Temp 98   Pulse 82 01/04/22 1705  Resp 13 01/04/22 1705  SpO2 96 % 01/04/22 1705  Vitals shown include unvalidated device data.  Last Pain:  Vitals:   01/04/22 1159  TempSrc:   PainSc: 8       Patients Stated Pain Goal: 2 (90/47/53 3917)  Complications: No notable events documented.

## 2022-01-04 NOTE — Anesthesia Postprocedure Evaluation (Signed)
Anesthesia Post Note  Patient: Jamie [name]  Procedure(s) Performed: BILATERAL BREAST REDUCTION (Bilateral: Breast)     Patient location during evaluation: PACU Anesthesia Type: General Level of consciousness: awake Pain management: pain level controlled Vital Signs Assessment: post-procedure vital signs reviewed and stable Respiratory status: spontaneous breathing, nonlabored ventilation and respiratory function stable Cardiovascular status: blood pressure returned to baseline and stable Postop Assessment: no apparent nausea or vomiting Anesthetic complications: no   No notable events documented.  Last Vitals:  Vitals:   01/04/22 1735 01/04/22 1750  BP: (!) 141/87 (!) 152/72  Pulse: 60 60  Resp: 18 16  Temp:  37.2 C  SpO2: 98% 100%    Last Pain:  Vitals:   01/04/22 1750  TempSrc:   PainSc: 4                  Nilda Simmer

## 2022-01-04 NOTE — Anesthesia Procedure Notes (Signed)
Procedure Name: Intubation Date/Time: 01/04/2022 2:05 PM  Performed by: Timoteo Expose, CRNAPre-anesthesia Checklist: Patient identified, Emergency Drugs available, Suction available and Patient being monitored Patient Re-evaluated:Patient Re-evaluated prior to induction Oxygen Delivery Method: Circle system utilized Preoxygenation: Pre-oxygenation with 100% oxygen Induction Type: IV induction Ventilation: Mask ventilation without difficulty Laryngoscope Size: Mac and 3 Grade View: Grade I Tube type: Oral Tube size: 7.0 mm Number of attempts: 1 Airway Equipment and Method: Stylet and Oral airway Placement Confirmation: ETT inserted through vocal cords under direct vision, positive ETCO2 and breath sounds checked- equal and bilateral Secured at: 21 cm Tube secured with: Tape Dental Injury: Teeth and Oropharynx as per pre-operative assessment

## 2022-01-04 NOTE — Interval H&P Note (Signed)
History and Physical Interval Note:  01/04/2022 1:44 PM  Jamie [name]  has presented today for surgery, with the diagnosis of MACROMASTIA.  The various methods of treatment have been discussed with the patient and family. After consideration of risks, benefits and other options for treatment, the patient has consented to  Procedure(s): BREAST REDUCTION WITH POSSIBLE FREE NIPPLE GRAFT (Bilateral) as a surgical intervention.  The patient's history has been reviewed, patient examined, no change in status, stable for surgery.  I have reviewed the patient's chart and labs.  Questions were answered to the patient's satisfaction.     [name]

## 2022-01-05 ENCOUNTER — Ambulatory Visit (INDEPENDENT_AMBULATORY_CARE_PROVIDER_SITE_OTHER): Payer: BC Managed Care – PPO | Admitting: Surgical

## 2022-01-05 ENCOUNTER — Telehealth: Payer: Self-pay | Admitting: Internal Medicine

## 2022-01-05 ENCOUNTER — Encounter (HOSPITAL_COMMUNITY): Payer: Self-pay | Admitting: Plastic Surgery

## 2022-01-05 DIAGNOSIS — G8929 Other chronic pain: Secondary | ICD-10-CM

## 2022-01-05 DIAGNOSIS — N62 Hypertrophy of breast: Secondary | ICD-10-CM

## 2022-01-05 DIAGNOSIS — M545 Low back pain, unspecified: Secondary | ICD-10-CM

## 2022-01-05 DIAGNOSIS — M542 Cervicalgia: Secondary | ICD-10-CM

## 2022-01-05 NOTE — Telephone Encounter (Signed)
FYI pt is calling to let md know her procedure went well

## 2022-01-05 NOTE — Progress Notes (Signed)
62 year old female who underwent bilateral breast reduction via free nipple graft yesterday with Dr. Marla Roe and Dr. Lovena Le.  She reports today that she is overall doing well, has some questions about pain medication, blood thinners and diet restrictions.  She reports she has used oxycodone which has been somewhat helpful, she is going to take some Tylenol or ibuprofen as well.  She reports she has been using the Lovenox which was prescribed for her for 7 days after surgery for VTE prophylaxis.  She has some questions about dieting and specific foods that she should or should not eat.  The patient gave consent to have this visit done by telemedicine / virtual visit, two identifiers were used to identify patient. This is also consent for access the chart and treat the patient via this visit. The patient is located at home.  I, the provider, am at the office.  We spent 5 minutes together for the visit.  Joined by telephone.   Recommend avoiding high sugars or high carb foods, recommend continuing with oxycodone as needed for pain control.  Recommend adding Tylenol or ibuprofen as well.  Recommend calling with any further questions or concerns, we will plan to see her at her 1 week postop.

## 2022-01-08 LAB — SURGICAL PATHOLOGY

## 2022-01-09 ENCOUNTER — Telehealth: Payer: Self-pay | Admitting: *Deleted

## 2022-01-09 NOTE — Telephone Encounter (Signed)
Pt called earlier to report she had accidentally wasted one of her lovenox syringes when attempting to give herself her injection today. She asked if she needed to get another dose to complete 7 days. Discussed with Dr. Marla Roe who states pt does not need to replace the dose, can use what she has and stop after 6 days. Pt verbalized understanding

## 2022-01-11 ENCOUNTER — Telehealth: Payer: Self-pay | Admitting: Plastic Surgery

## 2022-01-11 NOTE — Telephone Encounter (Signed)
LVM for pt that FMLA was successfully sent to appropriate contact that she gave [place] this morning to fax#[phone].

## 2022-01-12 ENCOUNTER — Ambulatory Visit (INDEPENDENT_AMBULATORY_CARE_PROVIDER_SITE_OTHER): Payer: BC Managed Care – PPO | Admitting: Plastic Surgery

## 2022-01-12 ENCOUNTER — Encounter: Payer: Self-pay | Admitting: Plastic Surgery

## 2022-01-12 VITALS — BP 155/88 | HR 63

## 2022-01-12 DIAGNOSIS — N62 Hypertrophy of breast: Secondary | ICD-10-CM

## 2022-01-12 NOTE — Progress Notes (Signed)
The patient is a 62 year old female here for follow-up after undergoing bilateral breast reduction January 4.  She had over 1500 g removed from both breasts.  The nipple areolas are alive.  Overall she is doing really well.  I was able to remove the drains.  I think she still has some swelling and significant bruising.  She should continue with either a sports bra or the breast binder.  I will see her back in a week and a half.  Will get pictures at that time.

## 2022-01-17 ENCOUNTER — Encounter: Payer: Self-pay | Admitting: Physician Assistant

## 2022-01-17 ENCOUNTER — Ambulatory Visit (INDEPENDENT_AMBULATORY_CARE_PROVIDER_SITE_OTHER): Payer: BC Managed Care – PPO | Admitting: Physician Assistant

## 2022-01-17 ENCOUNTER — Telehealth: Payer: Self-pay | Admitting: Plastic Surgery

## 2022-01-17 VITALS — BP 116/73 | HR 61

## 2022-01-17 DIAGNOSIS — Z9889 Other specified postprocedural states: Secondary | ICD-10-CM

## 2022-01-17 NOTE — Progress Notes (Signed)
Patient is a pleasant 62 year old female with PMH of macromastia s/p bilateral breast duction performed 01/04/2022 by Dr. Marla Roe presents to clinic for postoperative follow-up.  She was last seen here in clinic on 01/12/2022.  At that time, NAC's are viable bilaterally and the postoperative drains were removed without complication or difficulty.  There was moderate swelling and bruising noted on exam, but overall reassuring and plan for follow-up in 1 to 2 weeks.    Today, she called the office with concerns about a bump underneath her chin.  She was unsure if this was possibly related to surgery.  She is accompanied by family at bedside.  Patient reports that she just noticed the bump yesterday and she also has an additional area of firmness adjacent to the left clavicle.  She denies any fevers, leg swelling, SOB, breast pain, drainage, or other symptoms.  She reports that her chronic upper back and neck discomfort in the context of her macromastia has already improved considerably since surgery.  While she feels she is still large, she is pleased.  On exam, dressings and Steri-Strips remain firmly intact.  There is some ecchymoses centrally on the breasts bilaterally, but the NAC's appear to be viable.  Warm and sensitive to light touch.  Difficult to assess capillary refill.  Breasts are overall soft, no obvious ballotable fluid collections or concern for seroma/hematoma.  Some overlying ecchymoses centrally bilaterally.  JP drain insertion sites already healed.  Additionally, she does have a 2 x 2 centimeter mobile mass that is nontender underneath the chin, no overlying skin changes.  Patent oropharynx.  No trismus.  No changes in phonation and she denies any sore throat/difficulty swallowing.  She also has ropelike firmness left subclavicular region.  This too is nontender and without any overlying skin changes.  Suspect that the bump underneath her chin is reflective of submental lymphadenopathy.   As for the ropelike discrete mass underneath the left clavicle, felt comparable to fibrocystic breast changes.  Did not appreciate any supraclavicular lymphadenopathy.  She does tell me that she had a URI towards the end of December 2023 after traveling to [place].  Perhaps this is simply lymphadenopathy secondary to URI.  Regardless, will defer to her primary care provider, Dr. Isaac Bliss.  Encouraged close outpatient follow-up for ongoing assessment.  From a breast reduction standpoint, she is healing nicely.  Will leave bandages in place for now as they are firmly in place, plan to remove them as well as her Steri-Strips at subsequent encounter.   Picture(s) obtained of the patient and placed in the chart were with the patient's or guardian's permission. Vitals:   01/17/22 1343  BP: 116/73  Pulse: 61  SpO2: 100%

## 2022-01-17 NOTE — Telephone Encounter (Signed)
Pt called and said she has a bump under neck and unsure if that is fluid build up sx was 1.4.24

## 2022-01-18 ENCOUNTER — Encounter: Payer: Self-pay | Admitting: Internal Medicine

## 2022-01-18 ENCOUNTER — Ambulatory Visit (INDEPENDENT_AMBULATORY_CARE_PROVIDER_SITE_OTHER): Payer: BC Managed Care – PPO | Admitting: Internal Medicine

## 2022-01-18 VITALS — BP 122/80 | HR 79 | Temp 98.3°F | Ht 71.0 in | Wt 277.2 lb

## 2022-01-18 DIAGNOSIS — R590 Localized enlarged lymph nodes: Secondary | ICD-10-CM

## 2022-01-18 NOTE — Progress Notes (Signed)
Established Patient Office Visit     CC/Reason for Visit: "Bump under her chin"  HPI: Jamie [name] is a 62 y.o. female who is coming in today for the above mentioned reasons.  She had breast reduction surgery on January 4.  Earlier this week she noticed a bump under her chin and a bump on her left chest wall.  She followed up with her surgeons and they do not believe this is surgically related.  She did have a URI in December.   Past Medical/Surgical History: Past Medical History:  Diagnosis Date   Acquired dilation of ascending aorta and aortic root ([place])    24m by 2D echo 03/2019   Allergy    Aortic valve disease    functionally bicuspid AV with fusion of the right and left coronary cusps with no AS by echo 03/2020   Arthritis    in back   Chest pain    neg cath 2016 after false positive myoview   Depression    Diabetes mellitus without complication ([place]    type 2, no meds   Dyspnea    with exertion   GERD (gastroesophageal reflux disease)    Hyperglycemia    Hyperlipidemia    Hypertension    Joint pain    Knee pain    Low back pain    Migraine    Mitral valve prolapse    Muscle cramps    [place] HT[place]    mild with PASP 355mg by echo 03/2019 but 2619m on echo 03/2020   Sleep apnea    uses cpap    Past Surgical History:  Procedure Laterality Date   BACK SURGERY     BREAST BIOPSY     BREAST REDUCTION SURGERY Bilateral 01/04/2022   Procedure: BILATERAL BREAST REDUCTION;  Surgeon: DilWallace GoingO;  [facility]  Laterality: Bilateral;   CESAREAN SECTION     x 1   COLONOSCOPY     LEFT HEART CATHETERIZATION WITH CORONARY ANGIOGRAM N/A 07/08/2013   Procedure: LEFT HEART CATHETERIZATION WITH CORONARY ANGIOGRAM;  Surgeon: PetJosue HectorD;  [facility];  Service: Cardiovascular;  Laterality: N/A;   TUBAL LIGATION      Social History:  reports that she quit smoking about 25 years ago. Her smoking use  included cigarettes. She has been exposed to tobacco smoke. She has never used smokeless tobacco. She reports current alcohol use. She reports that she does not use drugs.  Allergies: No Known Allergies  Family History:  Family History  Problem Relation Age of Onset   Hyperlipidemia Mother    Hypertension Mother    AAA (abdominal aortic aneurysm) Mother    Stroke Father    Hypertension Father    Hyperlipidemia Brother        x2   Hypertension Brother        x2   Liver cancer Maternal Aunt    Breast cancer Cousin    Colon cancer Cousin    Colon polyps Neg Hx    Esophageal cancer Neg Hx    Stomach cancer Neg Hx    Rectal cancer Neg Hx      Current Outpatient Medications:    acetaminophen (TYLENOL) 500 MG tablet, Take 1,000 mg by mouth every 6 (six) hours as needed for moderate pain., Disp: , Rfl:    Aromatic Inhalants (VICKS VAPOR INHALER IN), Inhale 1 puff into the lungs daily as needed (congestion)., Disp: ,  Rfl:    atorvastatin (LIPITOR) 80 MG tablet, Take 1 tablet (80 mg total) by mouth daily., Disp: 90 tablet, Rfl: 1   bismuth subsalicylate (PEPTO BISMOL) 262 MG/15ML suspension, Take 30 mLs by mouth every 6 (six) hours as needed for diarrhea or loose stools or indigestion., Disp: , Rfl:    Camphor-Eucalyptus-Menthol (VICKS VAPORUB EX), Apply 1 Application topically at bedtime as needed (congestion)., Disp: , Rfl:    carvedilol (COREG) 12.5 MG tablet, Take 1 tablet (12.5 mg total) by mouth 2 (two) times daily with a meal., Disp: 180 tablet, Rfl: 1   cephALEXin (KEFLEX) 500 MG capsule, Take 500 mg by mouth 4 (four) times daily., Disp: , Rfl:    Dextromethorphan-GG-APAP (CORICIDIN HBP COLD/COUGH/FLU) 10-200-325 MG/15ML LIQD, Take 1 Dose by mouth daily as needed (congestion / cold symptoms)., Disp: , Rfl:    DM-Doxylamine-Acetaminophen (CORICIDIN HBP NIGHTTIME COLD PO), Take 2 tablets by mouth daily as needed (congestion)., Disp: , Rfl:    furosemide (LASIX) 20 MG tablet, Take 20  mg by mouth daily., Disp: , Rfl:    gabapentin (NEURONTIN) 300 MG capsule, Take 300 mg by mouth daily., Disp: , Rfl:    hydrALAZINE (APRESOLINE) 100 MG tablet, Take 1 tablet (100 mg total) by mouth 3 (three) times daily., Disp: 270 tablet, Rfl: 1   losartan (COZAAR) 100 MG tablet, Take 1 tablet (100 mg total) by mouth daily., Disp: 90 tablet, Rfl: 1   meloxicam (MOBIC) 15 MG tablet, Take 15 mg by mouth daily., Disp: , Rfl:    Multiple Vitamin (MULTIVITAMIN WITH MINERALS) TABS tablet, Take 1 tablet by mouth daily., Disp: , Rfl:    ondansetron (ZOFRAN) 4 MG tablet, Take 1 tablet (4 mg total) by mouth every 8 (eight) hours as needed for up to 20 doses for nausea or vomiting., Disp: 20 tablet, Rfl: 0   ondansetron (ZOFRAN-ODT) 4 MG disintegrating tablet, Take 4 mg by mouth every 8 (eight) hours as needed for nausea or vomiting., Disp: , Rfl:    oxyCODONE (ROXICODONE) 5 MG immediate release tablet, Take 1 tablet (5 mg total) by mouth every 8 (eight) hours as needed for up to 20 doses for severe pain., Disp: 20 tablet, Rfl: 0   rizatriptan (MAXALT-MLT) 10 MG disintegrating tablet, Take 1 tablet earliest onset of migraine.  May repeat in 2 hours if needed.  Maximum 2 tablets in 24 hours, Disp: 10 tablet, Rfl: 5   topiramate (TOPAMAX) 25 MG tablet, Take 1 tablet (25 mg total) by mouth at bedtime., Disp: 30 tablet, Rfl: 5   enoxaparin (LOVENOX) 40 MG/0.4ML injection, Inject 0.4 mLs (40 mg total) into the skin daily for 7 days., Disp: 2.8 mL, Rfl: 0  Review of Systems:  Negative unless indicated in HPI.   Physical Exam: Vitals:   01/18/22 1119  BP: 122/80  Pulse: 79  Temp: 98.3 F (36.8 C)  TempSrc: Oral  SpO2: 99%  Weight: 277 lb 3 oz (125.7 kg)  Height: '5\' 11"'$  (1.803 m)    Body mass index is 38.66 kg/m.   Physical Exam Vitals reviewed.  Constitutional:      Appearance: Normal appearance.  HENT:     Head: Normocephalic and atraumatic.  Eyes:     Conjunctiva/sclera: Conjunctivae  normal.     Pupils: Pupils are equal, round, and reactive to light.  Lymphadenopathy:     Head:     Left side of head: Submental adenopathy present.     Upper Body:     Left  upper body: Pectoral adenopathy present.  Skin:    General: Skin is warm and dry.  Neurological:     General: No focal deficit present.     Mental Status: She is alert and oriented to person, place, and time.  Psychiatric:        Mood and Affect: Mood normal.        Behavior: Behavior normal.        Thought Content: Thought content normal.        Judgment: Judgment normal.      Impression and Plan:  LAD (lymphadenopathy), submental  -Likely reactive to recent surgery, for now observation, she will return if still present in 6 to 8 weeks.  Time spent:22 minutes reviewing chart, interviewing and examining patient and formulating plan of care.     Lelon Frohlich, MD [facility]

## 2022-01-26 ENCOUNTER — Ambulatory Visit (INDEPENDENT_AMBULATORY_CARE_PROVIDER_SITE_OTHER): Payer: BC Managed Care – PPO | Admitting: Physician Assistant

## 2022-01-26 ENCOUNTER — Encounter: Payer: BC Managed Care – PPO | Admitting: Student

## 2022-01-26 DIAGNOSIS — Z9889 Other specified postprocedural states: Secondary | ICD-10-CM

## 2022-01-26 NOTE — Progress Notes (Signed)
This is a 62 year old female with a past medical history of macromastia status post bilateral breast reduction on 01/04/2022 by Dr. Marla Roe.    She was last seen in the office on 01/17/2022.  At that time she had been doing well.  She had her drains removed.  She did note some nodule under her chin and along her clavicle which have completely gone away since that time.  Since her last office visit she has been doing well with no significant complaints or concerns.  She notes a mild odor coming from the inframammary incisions but notes that the dressing has been falling off as well as the Steri-Strips.  She denies any fever or infectious signs or symptoms, pain well-controlled.  Chaperone present.  On exam breasts are symmetric, ecchymosis noted bilaterally predominantly on the right breast, no warmth to touch, no overlying redness, no palpable fluid collections or area of firmness.  Dressings were removed revealing intact incisions throughout, some minimal scabbing along the right vertical limb but otherwise no significant wounds or drainage. Bilateral NAC's are viable   Overall the patient is doing well, she has no significant wounds or ongoing issues, no signs of infection.  I have instructed her to use Vaseline along the incisions, she has a scheduled follow-up evaluation in our office.  She will continue wearing the compressive sports bra she will reach out immediately if she develops any new or worsening signs or symptoms.  She verbalized understanding and agreement to today's plan and had no further questions or concerns.

## 2022-01-30 ENCOUNTER — Telehealth: Payer: Self-pay

## 2022-01-30 ENCOUNTER — Telehealth: Payer: Self-pay | Admitting: Plastic Surgery

## 2022-01-30 DIAGNOSIS — Z9012 Acquired absence of left breast and nipple: Secondary | ICD-10-CM | POA: Diagnosis not present

## 2022-01-30 NOTE — Telephone Encounter (Signed)
Thanks

## 2022-01-30 NOTE — Telephone Encounter (Signed)
Pt called asking about a prescription for a bra to [facility]

## 2022-01-30 NOTE — Telephone Encounter (Signed)
I spoke to the patient and made aware insurance may not cover garments from [facility]. She conveyed understanding. I faxed Rx, OV note, Demographics, and Insurance information to [facility] with confirmed receipt.

## 2022-01-30 NOTE — Telephone Encounter (Signed)
Pt called front desk inquiring about a prescription from [facility].I couldn't assist the patient at the time due to chaperoning in an exam room. I didn't see it mentioned in either of your notes. Do you all advise she goes to [facility]? If so, we'll need a signature from one of you or the surgeon to fax over to them.   Thanks!

## 2022-02-02 ENCOUNTER — Telehealth: Payer: Self-pay | Admitting: Plastic Surgery

## 2022-02-02 NOTE — Telephone Encounter (Signed)
Pt called asking what she can take for the itching at the surgical site. Pt reports rubbing so hard she caused bruising. Please call pt to advise: [phone]

## 2022-02-02 NOTE — Telephone Encounter (Signed)
62 year old female status post breast reduction, She reports she is overall doing well, however has had some itching of her bilateral breast.  She does not notice any rash or any other changes to her breast.  She reports she does have a bruise from where she was rubbing the area of concern.  She does not have any other concerns.  Recommend Benadryl at night, Zyrtec or Allegra during the day.  She can also use Vaseline around the incisions to help with dry skin.  Recommend hydrocortisone cream 1% over-the-counter along the area of itching.  She is scheduled to follow-up next week.  Recommend calling if she would like to be seen sooner.

## 2022-02-06 ENCOUNTER — Telehealth: Payer: Self-pay | Admitting: *Deleted

## 2022-02-06 NOTE — Telephone Encounter (Signed)
Received on (01/30/2022) via of fax DME Standard Written Order from [facility] requesting signature and return.  Given to provider to sign and return.    DME Standard Written Order signed,faxed,and confirmation received.  Copy scanned into the chart.//AB/CMA

## 2022-02-09 ENCOUNTER — Encounter: Payer: Self-pay | Admitting: Student

## 2022-02-09 ENCOUNTER — Ambulatory Visit (INDEPENDENT_AMBULATORY_CARE_PROVIDER_SITE_OTHER): Payer: BC Managed Care – PPO | Admitting: Student

## 2022-02-09 VITALS — BP 157/89 | HR 76 | Ht 71.0 in | Wt 280.2 lb

## 2022-02-09 DIAGNOSIS — Z9889 Other specified postprocedural states: Secondary | ICD-10-CM

## 2022-02-09 NOTE — Progress Notes (Signed)
Patient is a 62 year old female who underwent bilateral breast reduction with Dr. Marla Roe on 01/04/2022.  She is approximately 5 weeks postop.  She presents to the clinic for postoperative follow-up.  Patient was last seen in the clinic on 01/26/2022.  At this visit, she reported she was doing well.  She did states she had a mild odor coming from the inframammary incisions.  She denied any infectious symptoms.  On exam, breasts were symmetric.  There is ecchymosis noted bilaterally, more so on the right breast.  Patient's dressings were removed, incisions were intact throughout.  There is some minimal scabbing noted along the right vertical limb.  Bilateral NAC's were viable.  Plan is for patient to apply Vaseline along her incisions and continue to wear compression.  Today, patient reports she is doing well.  She states that she has had some itching diffusely to her breast.  She does state that she has a small bump to the medial aspect of her left breast.  She states that she has had this for about 2 weeks.  She denies any feelings of her throat closing or any shortness of breath.  She denies recently changing her soaps or detergents.  She denies any fevers or chills.  She reports that she feels the closure of the left inframammary incision looks a little different than the right inframammary incision.  She also reports she notices some tissue to the lateral aspects of her breasts more than she did prior to surgery.  Chaperone present on exam.  On exam, patient is sitting upright in no acute distress.  Breasts are soft and symmetric bilaterally.  There is some very mild swelling noted.  There are no significant fluid collections palpated on exam.  To her medial breast bilaterally, the skin appears slightly irritated.  To the medial aspect of the left breast, there is a small 0.5 cm x 0.5 cm bump.  There is no surrounding erythema or drainage from it.  NAC's appear viable bilaterally.  There is some residual  Dermabond versus scabbing noted to the periphery of the NAC's bilaterally.  Incisions appear to be intact and healing well.  To the lateral aspect of the left inframammary incision, there appears to be some mild wrinkling of the skin near the incision.  Patient noted to have some excess axillary tissue bilaterally.  I discussed with the patient that I would like her to continue to apply Vaseline daily to her incisions.  I discussed with her that the Vaseline should help remove the residual Dermabond.  I discussed with the patient that for her itching, she may apply hydrocortisone cream to the areas that are itching.  I discussed with her to not apply the hydrocortisone cortisone cream to her incision.  I also discussed with the patient that she may take an antihistamine such as Claritin or Zyrtec.  Patient states that she does not want to take Benadryl at this time.  I discussed with the patient to continue to monitor the area.  Patient expressed understanding.  I discussed with the patient that she should continue compression at all times.  I discussed with her that she may start very slowly gradually increasing her activities.  Patient expressed understanding.  Patient to follow-up in 1 week.  I instructed the patient to call in the meantime if she has any questions or concerns.  Pictures were obtained of the patient and placed in the chart with the patient's or guardian's permission.

## 2022-02-19 ENCOUNTER — Encounter: Payer: Self-pay | Admitting: Student

## 2022-02-19 ENCOUNTER — Telehealth: Payer: Self-pay | Admitting: Internal Medicine

## 2022-02-19 ENCOUNTER — Ambulatory Visit (INDEPENDENT_AMBULATORY_CARE_PROVIDER_SITE_OTHER): Payer: BC Managed Care – PPO | Admitting: Student

## 2022-02-19 VITALS — BP 164/85 | HR 77

## 2022-02-19 DIAGNOSIS — Z9889 Other specified postprocedural states: Secondary | ICD-10-CM

## 2022-02-19 MED ORDER — FUROSEMIDE 20 MG PO TABS: 20.0000 mg | ORAL_TABLET | Freq: Every day | ORAL | 0 refills | Status: DC

## 2022-02-19 NOTE — Progress Notes (Signed)
Patient is a 62 year old female who underwent bilateral breast reduction with Dr. Marla Roe on 01/04/2022.  She is a little over 6 weeks postop.  She presents to the clinic for postoperative follow-up today.  Patient was last seen in the clinic on 02/09/2022.  She reported that she was having some itching diffusely to her breasts.  She denied any fevers or chills.  On exam, breasts were soft and symmetric bilaterally.  There is some mild swelling noted.  There were no significant fluid collections palpated on exam.  There appeared to be some irritation noted to her breast bilaterally.  To the medial aspect of her left breast, there is a small 0.5 cm x 0.5 cm bump.  There is no surrounding erythema or drainage.  NAC's appear viable bilaterally.  Plan is for patient to apply Vaseline to her incisions daily.  I also discussed with her that she may apply some hydrocortisone cream to the areas itching and that she may take an antihistamine such as Claritin or Zyrtec.  Today, patient reports she is doing well.  She states that she still experiencing itching to the medial aspects of her breast bilaterally.  She states that she has not applied any hydrocortisone cream to the area.  She states she has not taken any antihistamines.  She states that the small bump that was present at her previous visit is still present.  She denies any drainage from it.  She denies it growing in size.  She denies any redness around it.  She states that she feels the irritation to her skin to the needle aspects of her breast has lightened up a little bit.  Patient also reports she is a little bothered by the wrinkling of the skin to the left inframammary incision.  She is also inquiring about what she can do in her lifestyle to improve her lateral axillary tissue.  Chaperone present on exam.  On exam, patient is sitting upright in no acute distress.  Breasts are soft.  There is some irritation noted to the skin to the medial aspects of the  breast bilaterally.  There is a small wheal that is 0.5 cm x 0.5 cm noted to the medial aspect of the left breast.  It appears similar to previous exam.  There is no drainage noted to it.  There is some firmness noted bilaterally that is consistent with some scarring versus fat necrosis.  NAC's appear viable bilaterally.  Incisions are intact and healing well.  There is a little bit of wrinkling to the skin noted to the lateral aspect of the inframammary incision.  I instructed the patient to apply hydrocortisone cream to the medial aspects of her breast bilaterally.  I told her to avoid the incisions with the hydrocortisone cream.  I also recommend she take an antihistamine such as Zyrtec or Claritin if she still experiences itching.  I discussed with the patient to continue to monitor her symptoms and the small wheal to her left breast.  Discussed with her that if her symptoms worsens or the we will changes in size or character to let us know.  Patient expressed understanding.  I discussed with the patient that if she is still continued having irritation and itching to the skin that lasts another few weeks or months, I would like her to follow back up with [place] in regards to that.  Patient expressed understanding.  I discussed with the patient that I would like her to continue to apply Vaseline to  the incisions for another 2 to 3 weeks.  I discussed with her she may start using scar creams after that.  Patient expressed understanding.  I discussed with the patient that the wrinkling and rippling to the lateral aspect of her incision may settle as she continues to heal.  We will have her follow-up with Dr. Marla Roe for evaluation of this as well.  I would also like patient to follow-up via telephone visit in a few months to check in to see if she is still experiencing irritation or has any other issues at that time.  Patient was in agreement with this.  Pictures were obtained of the patient and placed  in the chart with the patient's or guardian's permission.  I instructed the patient to call in the meantime if she has any questions or concerns.

## 2022-02-19 NOTE — Telephone Encounter (Signed)
Refill sent.

## 2022-02-19 NOTE — Telephone Encounter (Signed)
Prescription Request  02/19/2022  Is this a "Controlled Substance" medicine? No  LOV: 01/18/2022  What is the name of the medication or equipment? furosemide (LASIX) 20 MG tablet  Have you contacted your pharmacy to request a refill? Yes   Which pharmacy would you like this sent to?  [id number] [id number]: [phone]: [phone] Patient notified that their request is being sent to the clinical staff for review and that they should receive a response within 2 business days.   Please advise at Mobile [phone](mobile)

## 2022-03-04 ENCOUNTER — Other Ambulatory Visit: Payer: Self-pay | Admitting: Internal Medicine

## 2022-03-04 DIAGNOSIS — E785 Hyperlipidemia, unspecified: Secondary | ICD-10-CM

## 2022-03-05 ENCOUNTER — Other Ambulatory Visit: Payer: Self-pay | Admitting: Neurology

## 2022-03-05 ENCOUNTER — Other Ambulatory Visit: Payer: Self-pay | Admitting: Cardiology

## 2022-03-05 ENCOUNTER — Other Ambulatory Visit: Payer: Self-pay | Admitting: Internal Medicine

## 2022-03-05 DIAGNOSIS — I5032 Chronic diastolic (congestive) heart failure: Secondary | ICD-10-CM

## 2022-03-05 DIAGNOSIS — I10 Essential (primary) hypertension: Secondary | ICD-10-CM

## 2022-03-07 NOTE — Telephone Encounter (Signed)
No further refills until seen 04/02/2022 by [facility]

## 2022-03-13 ENCOUNTER — Ambulatory Visit (INDEPENDENT_AMBULATORY_CARE_PROVIDER_SITE_OTHER): Payer: BC Managed Care – PPO | Admitting: Plastic Surgery

## 2022-03-13 DIAGNOSIS — N62 Hypertrophy of breast: Secondary | ICD-10-CM

## 2022-03-13 NOTE — Progress Notes (Unsigned)
The patient is a 62 year old female here for follow-up after undergoing a bilateral breast reduction.  She had over 1500 g removed from both breasts on January 4.  She thinks there might be a little bit of a stitch on the left inframammary fold.  I think this is likely PDS.  I offered to numb her and remove it but also encouraged her that it will absorb.  She wants to wait.  She has an appointment for 6 weeks and we will see her back at that time.  She can increase activity.  We got pictures today and they are in the chart with the patient's permission.  She is thrilled with her results and has done a wonderful job.

## 2022-03-16 DIAGNOSIS — G4733 Obstructive sleep apnea (adult) (pediatric): Secondary | ICD-10-CM | POA: Diagnosis not present

## 2022-03-19 ENCOUNTER — Telehealth: Payer: Self-pay | Admitting: Plastic Surgery

## 2022-03-19 NOTE — Telephone Encounter (Signed)
Pt called and asked if she still needed to sleep with a bra, please advise

## 2022-03-22 ENCOUNTER — Telehealth: Payer: Self-pay | Admitting: Plastic Surgery

## 2022-03-22 NOTE — Telephone Encounter (Signed)
Pt called checking to see if she still had to wear a bra at night and per Donnamarie Rossetti, PA, pt is ok to NOT wear a bra at night.  Pt very excited.

## 2022-03-28 NOTE — Progress Notes (Signed)
NEUROLOGY FOLLOW UP OFFICE NOTE  [name] [id number]  Assessment/Plan:   Chronic migraine without aura, with status migrainosus, intractable OSA Hypertension - did not take her morning BP medication      Migraine prevention:  increase topiramate to 50mg  at bedtime. We can increase dose to 75mg  at bedtime in 2 months if needed Migraine rescue:  rizatriptan 10mg .  If still cannot tolerate, she will contact me and we will change to another acute medication.  Zofran 4mg   Limit use of pain relievers to no more than 2 days out of week to prevent risk of rebound or medication-overuse headache. Keep headache diary Continue use of CPAP Advised to take AM BP medications  Follow up in 6 months.     Subjective:  Jamie [name] is a 62 year old female with CHF, aortic valve disease, MVP, HTN, HLD, pulmonary HTN who follows up for migraines.   UPDATE: Started topiramate. Never really took rizatriptan.  She took one time and caused upset stomach.  Headaches had mostly resolved but then started having daily headaches 2 weeks ago (she attributes to seasonal allergies) Frequency of abortive medication: Tylenol daily (but takes for other pain as well) Current NSAIDS/analgesics: Tylenol Current triptans:  rizatriptan 10mg  Current ergotamine:  none Current anti-emetic:  Zofran 4mg  Current muscle relaxants:  none Current Antihypertensive medications:  metoprolol tartrate, carvedilol, furosemide, hydralazine,irbesartan-HCTZ, losartan Current Antidepressant medications:  none Current Anticonvulsant medications:  topiramate 25mg  QHS, gabapentin 300-600mg  PRN back pain) Current anti-CGRP:  none Current Vitamins/Herbal/Supplements:  Mg, melatonin, elderberry Current Antihistamines/Decongestants:  none Other therapy:  none Hormone/birth control:  none   Caffeine:  1 cup of coffee daily.  Sometimes soda and tea Diet:  Sometimes soda.  3 to 5 bottles water daily.  Skips meals Exercise:   was exercising but had to stop 6 weeks ago due to injury Depression:  no; Anxiety:  no Other pain:  back and knee pain Sleep hygiene:  Has OSA - uses CPAP at least 4 hours a night.   HISTORY:  She started having these intractable headaches in her 24s.  They usually occur once a year but not necessarily same time of year.  Most recent episode started in September and lasted 2 months.  She describes a daily headache.  A persistent dull headache with severe episodes occurring 15 days out of the month.  It is a holocephalic pressure/pounding (sometimes right sided) with photophobia and osmophobia but no nausea, phonophobia, autonomic symptoms, numbness or weakness.  She sees spots, but she has that all of the time.  She treated with Tylenol, which she has been taking daily for years to treat her arthritis.  Her PCP changed it to meloxicam which she continues to take daily.  No known triggers.  She has these headaches once a year but not necessarily same time of year.  She can't recall if they only occur with change in seasons.     She also has history of migraines since her 38s.  The are severe pounding pain involving frontal region/occipital region and right side of face.  Associated with nausea, photophobia, phonophobia and osmophobia.  They last 2 to 3 days and occur 6 times a year.   Past Imaging: 10/06/2017 CT HEAD WO:  Normal       Past NSAIDS/analgesics:  acetaminophen, ibuprofen, acetaminophen, tramadol Past abortive triptans:  none Past abortive ergotamine:  none Past muscle relaxants:  cyclobenzaprine, tizanidine Past anti-emetic:  none Past antihypertensive medications:  amlodipine Past  antidepressant medications:  none Past anticonvulsant medications:  none Past anti-CGRP:  none Past vitamins/Herbal/Supplements:  none Past antihistamines/decongestants:  none Other past therapies:  none     Family history of headache:  Brother.  No family history of aneurysm.   PAST MEDICAL  HISTORY: Past Medical History:  Diagnosis Date   Acquired dilation of ascending aorta and aortic root ([place])    24mm by 2D echo 03/2019   Allergy    Aortic valve disease    functionally bicuspid AV with fusion of the right and left coronary cusps with no AS by echo 03/2020   Arthritis    in back   Chest pain    neg cath 2016 after false positive myoview   Depression    Diabetes mellitus without complication (HCC)    type 2, no meds   Dyspnea    with exertion   GERD (gastroesophageal reflux disease)    Hyperglycemia    Hyperlipidemia    Hypertension    Joint pain    Knee pain    Low back pain    Migraine    Mitral valve prolapse    Muscle cramps    Pulmonary HTN ([place])    mild with PASP 74mmHg by echo 03/2019 but 59mmHg on echo 03/2020   Sleep apnea    uses cpap    MEDICATIONS: Current Outpatient Medications on File Prior to Visit  Medication Sig Dispense Refill   acetaminophen (TYLENOL) 500 MG tablet Take 1,000 mg by mouth every 6 (six) hours as needed for moderate pain.     atorvastatin (LIPITOR) 80 MG tablet TAKE 1 TABLET BY MOUTH EVERY DAY 90 tablet 1   bismuth subsalicylate (PEPTO BISMOL) 262 MG/15ML suspension Take 30 mLs by mouth every 6 (six) hours as needed for diarrhea or loose stools or indigestion.     Camphor-Eucalyptus-Menthol (VICKS VAPORUB EX) Apply 1 Application topically at bedtime as needed (congestion).     carvedilol (COREG) 12.5 MG tablet Take 1 tablet (12.5 mg total) by mouth 2 (two) times daily with a meal. 180 tablet 1   cephALEXin (KEFLEX) 500 MG capsule Take 500 mg by mouth 4 (four) times daily.     Dextromethorphan-GG-APAP (CORICIDIN HBP COLD/COUGH/FLU) 10-200-325 MG/15ML LIQD Take 1 Dose by mouth daily as needed (congestion / cold symptoms).     DM-Doxylamine-Acetaminophen (CORICIDIN HBP NIGHTTIME COLD PO) Take 2 tablets by mouth daily as needed (congestion).     enoxaparin (LOVENOX) 40 MG/0.4ML injection Inject 0.4 mLs (40 mg total) into the skin  daily for 7 days. 2.8 mL 0   furosemide (LASIX) 20 MG tablet TAKE 1 TABLET BY MOUTH EVERY DAY 90 tablet 0   gabapentin (NEURONTIN) 300 MG capsule Take 300 mg by mouth daily.     hydrALAZINE (APRESOLINE) 100 MG tablet Take 1 tablet (100 mg total) by mouth 3 (three) times daily. 270 tablet 1   losartan (COZAAR) 100 MG tablet Take 1 tablet (100 mg total) by mouth daily. 90 tablet 1   meloxicam (MOBIC) 15 MG tablet Take 15 mg by mouth daily.     Multiple Vitamin (MULTIVITAMIN WITH MINERALS) TABS tablet Take 1 tablet by mouth daily.     ondansetron (ZOFRAN) 4 MG tablet Take 1 tablet (4 mg total) by mouth every 8 (eight) hours as needed for up to 20 doses for nausea or vomiting. 20 tablet 0   ondansetron (ZOFRAN-ODT) 4 MG disintegrating tablet Take 4 mg by mouth every 8 (eight) hours as needed for  nausea or vomiting.     oxyCODONE (ROXICODONE) 5 MG immediate release tablet Take 1 tablet (5 mg total) by mouth every 8 (eight) hours as needed for up to 20 doses for severe pain. 20 tablet 0   rizatriptan (MAXALT-MLT) 10 MG disintegrating tablet Take 1 tablet earliest onset of migraine.  May repeat in 2 hours if needed.  Maximum 2 tablets in 24 hours 10 tablet 5   topiramate (TOPAMAX) 25 MG tablet TAKE 1 TABLET BY MOUTH EVERYDAY AT BEDTIME 90 tablet 0   No current facility-administered medications on file prior to visit.    ALLERGIES: No Known Allergies  FAMILY HISTORY: Family History  Problem Relation Age of Onset   Hyperlipidemia Mother    Hypertension Mother    AAA (abdominal aortic aneurysm) Mother    Stroke Father    Hypertension Father    Hyperlipidemia Brother        x2   Hypertension Brother        x2   Liver cancer Maternal Aunt    Breast cancer Cousin    Colon cancer Cousin    Colon polyps Neg Hx    Esophageal cancer Neg Hx    Stomach cancer Neg Hx    Rectal cancer Neg Hx       Objective:  Blood pressure (!) 162/92, pulse 69, height 5\' 11"  (1.803 m), weight 277 lb (125.6 kg),  SpO2 99 %. General: No acute distress.  Patient appears well-groomed.      Metta Clines, DO  CC: Matthew Saras, MD

## 2022-04-02 ENCOUNTER — Ambulatory Visit (INDEPENDENT_AMBULATORY_CARE_PROVIDER_SITE_OTHER): Payer: BC Managed Care – PPO | Admitting: Neurology

## 2022-04-02 ENCOUNTER — Encounter: Payer: Self-pay | Admitting: Neurology

## 2022-04-02 VITALS — BP 162/92 | HR 69 | Ht 71.0 in | Wt 277.0 lb

## 2022-04-02 DIAGNOSIS — I10 Essential (primary) hypertension: Secondary | ICD-10-CM | POA: Diagnosis not present

## 2022-04-02 DIAGNOSIS — G4733 Obstructive sleep apnea (adult) (pediatric): Secondary | ICD-10-CM

## 2022-04-02 DIAGNOSIS — G43009 Migraine without aura, not intractable, without status migrainosus: Secondary | ICD-10-CM

## 2022-04-02 MED ORDER — TOPIRAMATE 50 MG PO TABS: 50.0000 mg | ORAL_TABLET | Freq: Every day | ORAL | 5 refills | Status: DC

## 2022-04-02 NOTE — Patient Instructions (Signed)
Increase topiramate to 50mg  at bedtime.  If no improvement in 2 months, contact me and we can still increase dose If rizatriptan ineffective or continues to cause side effects, contact me and we can change medication Limit use of pain relievers to no more than 2 days out of week to prevent risk of rebound or medication-overuse headache. Keep headache diary Follow up 6 months.

## 2022-04-16 DIAGNOSIS — G4733 Obstructive sleep apnea (adult) (pediatric): Secondary | ICD-10-CM | POA: Diagnosis not present

## 2022-05-01 DIAGNOSIS — Z780 Asymptomatic menopausal state: Secondary | ICD-10-CM | POA: Diagnosis not present

## 2022-05-01 DIAGNOSIS — Z01419 Encounter for gynecological examination (general) (routine) without abnormal findings: Secondary | ICD-10-CM | POA: Diagnosis not present

## 2022-05-01 LAB — HM PAP SMEAR

## 2022-05-03 ENCOUNTER — Encounter: Payer: Self-pay | Admitting: Internal Medicine

## 2022-05-16 ENCOUNTER — Encounter: Payer: Self-pay | Admitting: Student

## 2022-05-16 ENCOUNTER — Ambulatory Visit (INDEPENDENT_AMBULATORY_CARE_PROVIDER_SITE_OTHER): Payer: BC Managed Care – PPO | Admitting: Student

## 2022-05-16 VITALS — BP 169/89 | HR 74

## 2022-05-16 DIAGNOSIS — N6459 Other signs and symptoms in breast: Secondary | ICD-10-CM | POA: Diagnosis not present

## 2022-05-16 DIAGNOSIS — G4733 Obstructive sleep apnea (adult) (pediatric): Secondary | ICD-10-CM | POA: Diagnosis not present

## 2022-05-16 DIAGNOSIS — Z9889 Other specified postprocedural states: Secondary | ICD-10-CM

## 2022-05-16 DIAGNOSIS — Z719 Counseling, unspecified: Secondary | ICD-10-CM

## 2022-05-16 NOTE — Progress Notes (Signed)
Referring Provider Philip Aspen, Limmie Patricia, MD [address] [place],  [place] [address]   CC:  Chief Complaint  Patient presents with   Post-op Follow-up      Jamie [name] is an 62 y.o. female.  HPI: Patient is a 62 year old female who underwent bilateral breast reduction with Dr. Ulice Bold on 01/04/2022.  She presents to the clinic today for follow-up.  Patient was last seen in the clinic on 03/13/2022.  At this visit, there is a little bit of stitch left in the inframammary fold which was most likely found to be PDS.  It was offered to the patient to have it removed, but patient wanted to wait until it absorbed.  Today, patient reports she is doing well.  She states that she still sometimes feels the stitch, but it is not really bothersome to her.  Patient also reports she still has some firmness to her right breast that she has been massaging.  She states that some days she notices it, and other days she does not.  She denies any other issues or concerns at this time.  Patient's blood pressure is elevated at today's exam.  Patient states that she has not taken her blood pressure medications for the past few days.  Review of Systems General: No changes to health.  Physical Exam    05/16/2022    8:41 AM 04/02/2022    9:11 AM 04/02/2022    8:40 AM  Vitals with BMI  Systolic 180 162 604  Diastolic 95 92 92  Pulse 65      General:  No acute distress,  Alert and oriented, Non-Toxic, Normal speech and affect Chaperone present on exam.  On exam, patient is sitting upright in no acute distress.  Breasts are overall soft bilaterally and are symmetric.  There is an area of firmness just superior/lateral to the NAC.  It appears to be consistent with fat necrosis.  There is no overlying erythema.  Left breast is soft.  There is no overlying erythema.  All incisions are intact.  There is no suture felt on exam at this time.  There is a little bit of what appears to be with  hypertrophic scarring to the superior aspect of the left NAC and the medial aspects of the inframammary incisions bilaterally.  Assessment/Plan  S/P bilateral breast reduction   I discussed with the patient that I would like her to continue to massage the area of firmness to her right breast.  I discussed with her that we will have her follow-up with Dr. Ulice Bold to see if any further procedures are warranted for this area of firmness.  Patient expressed understanding and was in agreement with this.  Patient states that she had 2 mammograms canceled prior to surgery.  I discussed with her that she may start getting mammograms again 6 months after surgery, so around July.  Patient states she is going to make an appointment for that time.  I also discussed with the patient that I would like her to apply a silicone-based scar cream or silicone tapes to the areas of what appears to be hypertrophic scarring.  I discussed with her that I like her to also massage these areas.  She states that these areas do not bother her at this time.   Patient to follow-up with Dr. Ulice Bold in 1 month.  I instructed the patient to call in the meantime if she has any questions or concerns.  I also recommended the  patient follow-up with her primary care provider if her blood pressure remains elevated.  Patient expressed understanding.  Jamie [name] 05/16/2022, 8:42 AM

## 2022-06-26 ENCOUNTER — Ambulatory Visit: Payer: BC Managed Care – PPO | Admitting: Plastic Surgery

## 2022-07-04 ENCOUNTER — Ambulatory Visit: Payer: BC Managed Care – PPO

## 2022-07-10 ENCOUNTER — Encounter: Payer: Self-pay | Admitting: Plastic Surgery

## 2022-07-10 ENCOUNTER — Ambulatory Visit (INDEPENDENT_AMBULATORY_CARE_PROVIDER_SITE_OTHER): Payer: 59 | Admitting: Plastic Surgery

## 2022-07-10 VITALS — BP 156/90 | HR 74 | Ht 70.5 in | Wt 269.2 lb

## 2022-07-10 DIAGNOSIS — N62 Hypertrophy of breast: Secondary | ICD-10-CM

## 2022-07-10 NOTE — Progress Notes (Signed)
   Subjective:    Patient ID: Jamie [name], female    DOB: Jul 21, 1960, 62 y.o.   MRN: [id number]  The patient is a 62 year old female here for follow-up after undergoing a breast reduction January 2024 when she had over 1500 g removed from both breasts.  She is very pleased with her results and has continued to lose some weight she is 269 pounds today.  He had a mammogram 2 weeks ago which is showing that she had it in the media section but the results are not showing up yet.  She did in [place] at a mobile unit.  She will let us know when she gets the results.  She is going to continue to try and lose weight as well.  She asked about some excess tissue in the lateral aspect of the breast.     Review of Systems  Constitutional: Negative.   HENT: Negative.    Eyes: Negative.   Respiratory: Negative.    Cardiovascular: Negative.   Gastrointestinal: Negative.   Endocrine: Negative.   Genitourinary: Negative.        Objective:   Physical Exam Constitutional:      Appearance: Normal appearance.  HENT:     Head: Normocephalic and atraumatic.  Cardiovascular:     Rate and Rhythm: Normal rate.     Pulses: Normal pulses.  Pulmonary:     Effort: Pulmonary effort is normal.  Musculoskeletal:        General: No swelling, tenderness or deformity.  Skin:    General: Skin is warm.     Capillary Refill: Capillary refill takes less than 2 seconds.     Coloration: Skin is not jaundiced or pale.     Findings: No bruising.  Neurological:     Mental Status: She is alert and oriented to person, place, and time.  Psychiatric:        Mood and Affect: Mood normal.        Behavior: Behavior normal.        Thought Content: Thought content normal.        Judgment: Judgment normal.         Assessment & Plan:     ICD-10-CM   1. Symptomatic mammary hypertrophy  N62        The excess tissue can be modified and removed but I recommend she wait until she is closer to her ideal weight.   The overhang may get worse with weight loss.  Patient is in agreement and we will see her back at that time.  Pictures were obtained of the patient and placed in the chart with the patient's or guardian's permission.

## 2022-08-08 ENCOUNTER — Ambulatory Visit (INDEPENDENT_AMBULATORY_CARE_PROVIDER_SITE_OTHER): Payer: 59

## 2022-08-08 ENCOUNTER — Telehealth (INDEPENDENT_AMBULATORY_CARE_PROVIDER_SITE_OTHER): Payer: 59 | Admitting: Family Medicine

## 2022-08-08 ENCOUNTER — Encounter: Payer: Self-pay | Admitting: Family Medicine

## 2022-08-08 VITALS — Ht 71.0 in | Wt 270.0 lb

## 2022-08-08 DIAGNOSIS — R6889 Other general symptoms and signs: Secondary | ICD-10-CM

## 2022-08-08 DIAGNOSIS — U071 COVID-19: Secondary | ICD-10-CM

## 2022-08-08 LAB — POCT RAPID STREP A (OFFICE): Rapid Strep A Screen: NEGATIVE

## 2022-08-08 LAB — POC COVID19 BINAXNOW: SARS Coronavirus 2 Ag: POSITIVE — AB

## 2022-08-08 NOTE — Progress Notes (Signed)
 Per patient no change in vitals since last visit, unable to obtain new vitals due to telehealth visit

## 2022-08-08 NOTE — Progress Notes (Signed)
Patient ID: Jamie [name], female   DOB: Oct 14, 1960, 62 y.o.   MRN: [id number]   Virtual Visit via Video Note  I connected with Herminio Heads on 08/08/22 at 10:15 AM EDT by a video enabled telemedicine application and verified that I am speaking with the correct person using two identifiers.  Location patient: home Location provider:work or home office Persons participating in the virtual visit: patient, provider  I discussed the limitations of evaluation and management by telemedicine and the availability of in person appointments. The patient expressed understanding and agreed to proceed.   HPI: Santeria has history of congestive heart failure with preserved ejection fraction, hypertension, type 2 diabetes currently managed without medication.  She is seen with onset yesterday of some nasal congestion, minimal cough, body aches, sore throat, headache.  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  Denies any nausea, vomiting, or diarrhea.  No dyspnea.  She was suspicious of COVID.   ROS: See pertinent positives and negatives per HPI.  Past Medical History:  Diagnosis Date   Acquired dilation of ascending aorta and aortic root (HCC)    38mm by 2D echo 03/2019   Allergy    Aortic valve disease    functionally bicuspid AV with fusion of the right and left coronary cusps with no AS by echo 03/2020   Arthritis    in back   Chest pain    neg cath 2016 after false positive myoview   Depression    Diabetes mellitus without complication (HCC)    type 2, no meds   Dyspnea    with exertion   GERD (gastroesophageal reflux disease)    Hyperglycemia    Hyperlipidemia    Hypertension    Joint pain    Knee pain    Low back pain    Migraine    Mitral valve prolapse    Muscle cramps    Pulmonary HTN (HCC)    mild with PASP by echo 03/2019 but on echo 03/2020   Sleep apnea    uses cpap    Past Surgical History:  Procedure  Laterality Date   BACK SURGERY     BREAST BIOPSY     BREAST REDUCTION SURGERY Bilateral 01/04/2022   Procedure: BILATERAL BREAST REDUCTION;  Surgeon: Peggye Form, DO;  Location: [facility] OR;  Service: Plastics;  Laterality: Bilateral;   CESAREAN SECTION     x 1   COLONOSCOPY     LEFT HEART CATHETERIZATION WITH CORONARY ANGIOGRAM N/A 07/08/2013   Procedure: LEFT HEART CATHETERIZATION WITH CORONARY ANGIOGRAM;  Surgeon: Wendall Stade, MD;  Location: [facility] CATH LAB;  Service: Cardiovascular;  Laterality: N/A;   TUBAL LIGATION      Family History  Problem Relation Age of Onset   Hyperlipidemia Mother    Hypertension Mother    AAA (abdominal aortic aneurysm) Mother    Stroke Father    Hypertension Father    Hyperlipidemia Brother        x2   Hypertension Brother        x2   Liver cancer Maternal Aunt    Breast cancer Cousin    Colon cancer Cousin    Colon polyps Neg Hx    Esophageal cancer Neg Hx    Stomach cancer Neg Hx    Rectal cancer Neg Hx     SOCIAL HX: Quit smoking around 1998   Current Outpatient Medications:    acetaminophen (  TYLENOL) 500 MG tablet, Take 1,000 mg by mouth every 6 (six) hours as needed for moderate pain., Disp: , Rfl:    atorvastatin (LIPITOR) 80 MG tablet, TAKE 1 TABLET BY MOUTH EVERY DAY, Disp: 90 tablet, Rfl: 1   bismuth subsalicylate (PEPTO BISMOL) 262 MG/15ML suspension, Take 30 mLs by mouth every 6 (six) hours as needed for diarrhea or loose stools or indigestion., Disp: , Rfl:    Camphor-Eucalyptus-Menthol (VICKS VAPORUB EX), Apply 1 Application topically at bedtime as needed (congestion)., Disp: , Rfl:    carvedilol (COREG) 12.5 MG tablet, Take 1 tablet (12.5 mg total) by mouth 2 (two) times daily with a meal., Disp: 180 tablet, Rfl: 1   Dextromethorphan-GG-APAP (CORICIDIN HBP COLD/COUGH/FLU) 10-200-325 MG/15ML LIQD, Take 1 Dose by mouth daily as needed (congestion / cold symptoms)., Disp: , Rfl:    DM-Doxylamine-Acetaminophen (CORICIDIN HBP  NIGHTTIME COLD PO), Take 2 tablets by mouth daily as needed (congestion)., Disp: , Rfl:    furosemide (LASIX) 20 MG tablet, TAKE 1 TABLET BY MOUTH EVERY DAY, Disp: 90 tablet, Rfl: 0   gabapentin (NEURONTIN) 300 MG capsule, Take 300 mg by mouth daily., Disp: , Rfl:    hydrALAZINE (APRESOLINE) 100 MG tablet, Take 1 tablet (100 mg total) by mouth 3 (three) times daily., Disp: 270 tablet, Rfl: 1   losartan (COZAAR) 100 MG tablet, Take 1 tablet (100 mg total) by mouth daily., Disp: 90 tablet, Rfl: 1   meloxicam (MOBIC) 15 MG tablet, Take 15 mg by mouth daily., Disp: , Rfl:    Multiple Vitamin (MULTIVITAMIN WITH MINERALS) TABS tablet, Take 1 tablet by mouth daily., Disp: , Rfl:    ondansetron (ZOFRAN-ODT) 4 MG disintegrating tablet, Take 4 mg by mouth every 8 (eight) hours as needed for nausea or vomiting., Disp: , Rfl:    rizatriptan (MAXALT-MLT) 10 MG disintegrating tablet, Take 1 tablet earliest onset of migraine.  May repeat in 2 hours if needed.  Maximum 2 tablets in 24 hours, Disp: 10 tablet, Rfl: 5   topiramate (TOPAMAX) 50 MG tablet, Take 1 tablet (50 mg total) by mouth at bedtime., Disp: 30 tablet, Rfl: 5  EXAM:  VITALS per patient if applicable:  GENERAL: alert, oriented, appears well and in no acute distress  HEENT: atraumatic, conjunttiva clear, no obvious abnormalities on inspection of external nose and ears  NECK: normal movements of the head and neck  LUNGS: on inspection no signs of respiratory distress, breathing rate appears normal, no obvious gross SOB, gasping or wheezing  CV: no obvious cyanosis  MS: moves all visible extremities without noticeable abnormality  PSYCH/NEURO: pleasant and cooperative, no obvious depression or anxiety, speech and thought processing grossly intact  ASSESSMENT AND PLAN:  Discussed the following assessment and plan:  COVID-patient had onset of symptoms yesterday.  Symptoms relatively mild thus far.  We did discuss antivirals but she has  gotten over COVID without difficulty in the past without medication and declines at this time.  We have written a work note for her to be out seventh through the ninth.  Discussed isolation recommendations.  Also recommend mask use for additional 5 days after the first 5 days Follow-up for any persistent or worsening symptoms     I discussed the assessment and treatment plan with the patient. The patient was provided an opportunity to ask questions and all were answered. The patient agreed with the plan and demonstrated an understanding of the instructions.   The patient was advised to call back or seek an  in-person evaluation if the symptoms worsen or if the condition fails to improve as anticipated.     Evelena Peat, MD

## 2022-08-24 ENCOUNTER — Other Ambulatory Visit: Payer: Self-pay | Admitting: Neurology

## 2022-08-29 ENCOUNTER — Other Ambulatory Visit: Payer: Self-pay | Admitting: Internal Medicine

## 2022-08-29 DIAGNOSIS — E785 Hyperlipidemia, unspecified: Secondary | ICD-10-CM

## 2022-08-29 DIAGNOSIS — I5032 Chronic diastolic (congestive) heart failure: Secondary | ICD-10-CM

## 2022-08-29 DIAGNOSIS — I10 Essential (primary) hypertension: Secondary | ICD-10-CM

## 2022-09-21 ENCOUNTER — Other Ambulatory Visit: Payer: Self-pay | Admitting: Internal Medicine

## 2022-09-21 DIAGNOSIS — Z0000 Encounter for general adult medical examination without abnormal findings: Secondary | ICD-10-CM

## 2022-10-02 NOTE — Progress Notes (Unsigned)
NEUROLOGY FOLLOW UP OFFICE NOTE  Jamie [name] [id number]  Assessment/Plan:   Chronic migraine without aura, with status migrainosus, intractable Obstructive sleep apnea       Migraine prevention:  topiramate 50mg  at bedtime Migraine rescue:  rizatriptan 10mg .  If still cannot tolerate, she will contact me and we will change to another acute medication.  Zofran 4mg   Limit use of pain relievers to no more than 2 days out of week to prevent risk of rebound or medication-overuse headache. Keep headache diary Continue use of CPAP ***.     Subjective:  Jamie [name] is a 62 year old female with CHF, aortic valve disease, MVP, HTN, HLD, pulmonary HTN who follows up for migraines.   UPDATE: Increased topiramate in April. *** Current NSAIDS/analgesics: Tylenol Current triptans:  rizatriptan 10mg  Current ergotamine:  none Current anti-emetic:  Zofran 4mg  Current muscle relaxants:  none Current Antihypertensive medications:  metoprolol tartrate, carvedilol, furosemide, hydralazine,irbesartan-HCTZ, losartan Current Antidepressant medications:  none Current Anticonvulsant medications:  topiramate 50mg  QHS, gabapentin 300-600mg  PRN back pain) Current anti-CGRP:  none Current Vitamins/Herbal/Supplements:  Mg, melatonin, elderberry Current Antihistamines/Decongestants:  none Other therapy:  none Hormone/birth control:  none   Caffeine:  1 cup of coffee daily.  Sometimes soda and tea Diet:  Sometimes soda.  3 to 5 bottles water daily.  Skips meals Exercise:  was exercising but had to stop 6 weeks ago due to injury Depression:  no; Anxiety:  no Other pain:  back and knee pain Sleep hygiene:  Has OSA - uses CPAP at least 4 hours a night.   HISTORY:  She started having these intractable headaches in her 80s.  They usually occur once a year but not necessarily same time of year.  Most recent episode started in September and lasted 2 months.  She describes a daily headache.  A  persistent dull headache with severe episodes occurring 15 days out of the month.  It is a holocephalic pressure/pounding (sometimes right sided) with photophobia and osmophobia but no nausea, phonophobia, autonomic symptoms, numbness or weakness.  She sees spots, but she has that all of the time.  She treated with Tylenol, which she has been taking daily for years to treat her arthritis.  Her PCP changed it to meloxicam which she continues to take daily.  No known triggers.  She has these headaches once a year but not necessarily same time of year.  She can't recall if they only occur with change in seasons.     She also has history of migraines since her 2s.  The are severe pounding pain involving frontal region/occipital region and right side of face.  Associated with nausea, photophobia, phonophobia and osmophobia.  They last 2 to 3 days and occur 6 times a year.   Past Imaging: 10/06/2017 CT HEAD WO:  Normal       Past NSAIDS/analgesics:  acetaminophen, ibuprofen, acetaminophen, tramadol Past abortive triptans:  none Past abortive ergotamine:  none Past muscle relaxants:  cyclobenzaprine, tizanidine Past anti-emetic:  none Past antihypertensive medications:  amlodipine Past antidepressant medications:  none Past anticonvulsant medications:  none Past anti-CGRP:  none Past vitamins/Herbal/Supplements:  none Past antihistamines/decongestants:  none Other past therapies:  none     Family history of headache:  Brother.  No family history of aneurysm.   PAST MEDICAL HISTORY: Past Medical History:  Diagnosis Date   Acquired dilation of ascending aorta and aortic root (HCC)    38mm by 2D echo 03/2019  Allergy    Aortic valve disease    functionally bicuspid AV with fusion of the right and left coronary cusps with no AS by echo 03/2020   Arthritis    in back   Chest pain    neg cath 2016 after false positive myoview   Depression    Diabetes mellitus without complication (HCC)     type 2, no meds   Dyspnea    with exertion   GERD (gastroesophageal reflux disease)    Hyperglycemia    Hyperlipidemia    Hypertension    Joint pain    Knee pain    Low back pain    Migraine    Mitral valve prolapse    Muscle cramps    Pulmonary HTN (HCC)    mild with PASP by echo 03/2019 but on echo 03/2020   Sleep apnea    uses cpap    MEDICATIONS: Current Outpatient Medications on File Prior to Visit  Medication Sig Dispense Refill   acetaminophen (TYLENOL) 500 MG tablet Take 1,000 mg by mouth every 6 (six) hours as needed for moderate pain.     atorvastatin (LIPITOR) 80 MG tablet TAKE 1 TABLET BY MOUTH EVERY DAY 90 tablet 0   bismuth subsalicylate (PEPTO BISMOL) 262 MG/15ML suspension Take 30 mLs by mouth every 6 (six) hours as needed for diarrhea or loose stools or indigestion.     Camphor-Eucalyptus-Menthol (VICKS VAPORUB EX) Apply 1 Application topically at bedtime as needed (congestion).     carvedilol (COREG) 12.5 MG tablet TAKE 1 TABLET (12.5MG  TOTAL) BY MOUTH TWICE A DAY WITH MEALS 180 tablet 0   Dextromethorphan-GG-APAP (CORICIDIN HBP COLD/COUGH/FLU) 10-200-325 MG/15ML LIQD Take 1 Dose by mouth daily as needed (congestion / cold symptoms).     DM-Doxylamine-Acetaminophen (CORICIDIN HBP NIGHTTIME COLD PO) Take 2 tablets by mouth daily as needed (congestion).     furosemide (LASIX) 20 MG tablet TAKE 1 TABLET BY MOUTH EVERY DAY 90 tablet 0   gabapentin (NEURONTIN) 300 MG capsule Take 300 mg by mouth daily.     hydrALAZINE (APRESOLINE) 100 MG tablet TAKE 1 TABLET BY MOUTH 3 TIMES DAILY. 270 tablet 0   losartan (COZAAR) 100 MG tablet TAKE 1 TABLET BY MOUTH EVERY DAY 90 tablet 0   meloxicam (MOBIC) 15 MG tablet Take 15 mg by mouth daily.     Multiple Vitamin (MULTIVITAMIN WITH MINERALS) TABS tablet Take 1 tablet by mouth daily.     ondansetron (ZOFRAN-ODT) 4 MG disintegrating tablet Take 4 mg by mouth every 8 (eight) hours as needed for nausea or vomiting.      rizatriptan (MAXALT-MLT) 10 MG disintegrating tablet Take 1 tablet earliest onset of migraine.  May repeat in 2 hours if needed.  Maximum 2 tablets in 24 hours 10 tablet 5   topiramate (TOPAMAX) 50 MG tablet Take 1 tablet (50 mg total) by mouth at bedtime. 30 tablet 5   No current facility-administered medications on file prior to visit.    ALLERGIES: No Known Allergies  FAMILY HISTORY: Family History  Problem Relation Age of Onset   Hyperlipidemia Mother    Hypertension Mother    AAA (abdominal aortic aneurysm) Mother    Stroke Father    Hypertension Father    Hyperlipidemia Brother        x2   Hypertension Brother        x2   Liver cancer Maternal Aunt    Breast cancer Cousin    Colon cancer Cousin  Colon polyps Neg Hx    Esophageal cancer Neg Hx    Stomach cancer Neg Hx    Rectal cancer Neg Hx       Objective:  *** General: No acute distress.  Patient appears well-groomed.   Head:  Normocephalic/atraumatic Neck:  Supple.  No paraspinal tenderness.  Full range of motion. Heart:  Regular rate and rhythm. Neuro:  Alert and oriented.  Speech fluent and not dysarthric.  Language intact.  CN II-XII intact.  Bulk and tone normal.  Muscle strength 5/5 throughout.  Deep tendon reflexes 2+ throughout.  Gait normal.  Romberg negative.    Shon Millet, DO  CC: Teodoro Kil, MD

## 2022-10-03 ENCOUNTER — Encounter: Payer: Self-pay | Admitting: Neurology

## 2022-10-03 ENCOUNTER — Ambulatory Visit (INDEPENDENT_AMBULATORY_CARE_PROVIDER_SITE_OTHER): Payer: 59 | Admitting: Neurology

## 2022-10-03 VITALS — BP 151/91 | HR 64 | Ht 69.0 in | Wt 272.6 lb

## 2022-10-03 DIAGNOSIS — G43009 Migraine without aura, not intractable, without status migrainosus: Secondary | ICD-10-CM

## 2022-10-03 DIAGNOSIS — G4733 Obstructive sleep apnea (adult) (pediatric): Secondary | ICD-10-CM | POA: Diagnosis not present

## 2022-10-03 MED ORDER — TOPIRAMATE 50 MG PO TABS: 50.0000 mg | ORAL_TABLET | Freq: Every day | ORAL | 5 refills | Status: DC

## 2022-10-03 NOTE — Patient Instructions (Addendum)
Continue topiramate 50mg  at bedtime At earliest onset of migraine, take Nurtec (maximum 1 tablet in 24 hours).  Iet me know how it works for you.   Limit use of pain relievers to no more than 2 days out of week to prevent risk of rebound or medication-overuse headache. Keep headache diary Use CPAP Follow up 6 months.

## 2022-10-10 ENCOUNTER — Ambulatory Visit
Admission: RE | Admit: 2022-10-10 | Discharge: 2022-10-10 | Disposition: A | Discharge status: Home / Self Care | Payer: 59 | Source: Ambulatory Visit | Attending: Internal Medicine | Admitting: Internal Medicine

## 2022-10-10 ENCOUNTER — Telehealth: Payer: Self-pay | Admitting: Internal Medicine

## 2022-10-10 DIAGNOSIS — Z0000 Encounter for general adult medical examination without abnormal findings: Secondary | ICD-10-CM

## 2022-10-10 NOTE — Telephone Encounter (Signed)
Did a mobile mammogram 06/23/22 and was wondering if they sent the results here.

## 2022-10-16 NOTE — Telephone Encounter (Signed)
Left detailed message on machine for patient that we have not yet received the mammogram results.

## 2022-10-24 ENCOUNTER — Encounter: Payer: Self-pay | Admitting: Gastroenterology

## 2022-11-21 ENCOUNTER — Ambulatory Visit (AMBULATORY_SURGERY_CENTER): Payer: 59

## 2022-11-21 ENCOUNTER — Emergency Department (HOSPITAL_BASED_OUTPATIENT_CLINIC_OR_DEPARTMENT_OTHER)
Admission: EM | Admit: 2022-11-21 | Discharge: 2022-11-21 | Disposition: A | Discharge status: Home / Self Care | Payer: 59 | Attending: Emergency Medicine | Admitting: Emergency Medicine

## 2022-11-21 ENCOUNTER — Other Ambulatory Visit: Payer: Self-pay

## 2022-11-21 ENCOUNTER — Emergency Department (HOSPITAL_BASED_OUTPATIENT_CLINIC_OR_DEPARTMENT_OTHER): Payer: 59 | Admitting: Radiology

## 2022-11-21 ENCOUNTER — Encounter: Payer: Self-pay | Admitting: Gastroenterology

## 2022-11-21 ENCOUNTER — Encounter (HOSPITAL_BASED_OUTPATIENT_CLINIC_OR_DEPARTMENT_OTHER): Payer: Self-pay | Admitting: Emergency Medicine

## 2022-11-21 VITALS — Ht 70.5 in | Wt 277.0 lb

## 2022-11-21 DIAGNOSIS — R0789 Other chest pain: Secondary | ICD-10-CM | POA: Insufficient documentation

## 2022-11-21 DIAGNOSIS — Z79899 Other long term (current) drug therapy: Secondary | ICD-10-CM | POA: Insufficient documentation

## 2022-11-21 DIAGNOSIS — Z1152 Encounter for screening for COVID-19: Secondary | ICD-10-CM | POA: Diagnosis not present

## 2022-11-21 DIAGNOSIS — I509 Heart failure, unspecified: Secondary | ICD-10-CM | POA: Diagnosis not present

## 2022-11-21 DIAGNOSIS — B349 Viral infection, unspecified: Secondary | ICD-10-CM | POA: Insufficient documentation

## 2022-11-21 DIAGNOSIS — I110 Hypertensive heart disease with heart failure: Secondary | ICD-10-CM | POA: Insufficient documentation

## 2022-11-21 DIAGNOSIS — R6889 Other general symptoms and signs: Secondary | ICD-10-CM

## 2022-11-21 DIAGNOSIS — R079 Chest pain, unspecified: Secondary | ICD-10-CM | POA: Diagnosis present

## 2022-11-21 DIAGNOSIS — Z860100 Personal history of colon polyps, unspecified: Secondary | ICD-10-CM

## 2022-11-21 LAB — URINALYSIS, W/ REFLEX TO CULTURE (INFECTION SUSPECTED)
Bacteria, UA: NONE SEEN
Bilirubin Urine: NEGATIVE
Glucose, UA: NEGATIVE mg/dL
Hgb urine dipstick: NEGATIVE
Ketones, ur: NEGATIVE mg/dL
Nitrite: NEGATIVE
Protein, ur: NEGATIVE mg/dL
Specific Gravity, Urine: 1.01 (ref 1.005–1.030)
pH: 8 (ref 5.0–8.0)

## 2022-11-21 LAB — BASIC METABOLIC PANEL
Anion gap: 8 (ref 5–15)
BUN: 14 mg/dL (ref 6–20)
CO2: 27 mmol/L (ref 22–32)
Calcium: 9.3 mg/dL (ref 8.9–10.3)
Chloride: 105 mmol/L (ref 98–111)
Creatinine, Ser: 0.77 mg/dL (ref 0.44–1.00)
GFR, Estimated: >60 mL/min (ref >60)
Glucose, Bld: 123 mg/dL — ABNORMAL HIGH (ref 70–99)
Potassium: 3.6 mmol/L (ref 3.5–5.1)
Sodium: 140 mmol/L (ref 135–145)

## 2022-11-21 LAB — TROPONIN I (HIGH SENSITIVITY)
Troponin I (High Sensitivity): 3 ng/L (ref <18)
Troponin I (High Sensitivity): 4 ng/L (ref <18)

## 2022-11-21 LAB — CBC
HCT: 41.1 % (ref 36.0–46.0)
Hemoglobin: 13.5 g/dL (ref 12.0–15.0)
MCH: 30.1 pg (ref 26.0–34.0)
MCHC: 32.8 g/dL (ref 30.0–36.0)
MCV: 91.5 fL (ref 80.0–100.0)
Platelets: 238 10*3/uL (ref 150–400)
RBC: 4.49 MIL/uL (ref 3.87–5.11)
RDW: 15.3 % (ref 11.5–15.5)
WBC: 5.1 10*3/uL (ref 4.0–10.5)
nRBC: 0 % (ref 0.0–0.2)

## 2022-11-21 LAB — RESP PANEL BY RT-PCR (RSV, FLU A&B, COVID)  RVPGX2
Influenza A by PCR: NEGATIVE
Influenza B by PCR: NEGATIVE
Resp Syncytial Virus by PCR: NEGATIVE
SARS Coronavirus 2 by RT PCR: NEGATIVE

## 2022-11-21 LAB — BRAIN NATRIURETIC PEPTIDE: B Natriuretic Peptide: 57.3 pg/mL (ref 0.0–100.0)

## 2022-11-21 MED ORDER — NA SULFATE-K SULFATE-MG SULF 17.5-3.13-1.6 GM/177ML PO SOLN
1.0000 | Freq: Once | ORAL | 0 refills | Status: AC
Start: 2022-11-21 — End: 2022-11-21

## 2022-11-21 NOTE — Discharge Instructions (Signed)
Thank you for allowing [place] to be a part of your care today.  Your workup is overall reassuring. You tested negative for Covid, flu, RSV.  Your lab work looks good and does not indicate damage to your heart or acute heart failure exacerbation.  Your chest x-ray was also negative for abnormalities.   Your symptoms may be related to a viral syndrome.  I recommend taking Tylenol as needed for body aches, headache, fever, etc.    Return to the ED if you develop sudden worsening of your symptoms or if you have new concerns.

## 2022-11-21 NOTE — ED Triage Notes (Signed)
Sob , chest tightness since 11AM Hx: chf Dizziness, palpitation. Denies URI symptoms

## 2022-11-21 NOTE — Progress Notes (Signed)
No egg or soy allergy known to patient  No issues known to pt with past sedation with any surgeries or procedures Patient denies ever being told they had issues or difficulty with intubation  No FH of Malignant Hyperthermia Pt is not on diet pills Pt is not on  home 02  Pt is not on blood thinners  Pt denies issues with constipation  No A fib or A flutter Have any cardiac testing pending--NO Pt can ambulate indepenedently Pt denies use of chewing tobacco Discussed diabetic I weight loss medication holds Discussed NSAID holds Checked BMI Pt instructed to use Singlecare[employer]com or GoodRx for a price reduction on prep  Patient's chart reviewed by Cathlyn Parsons CNRA prior to previsit and patient appropriate for the [facility].  Pre visit completed and red dot placed by patient's name on their procedure day (on provider's schedule).

## 2022-11-21 NOTE — ED Provider Notes (Signed)
[place] EMERGENCY DEPARTMENT AT [facility] Provider Note   CSN: [id number] Arrival date & time: 11/21/22  1222     History  Chief Complaint  Patient presents with   Chest Pain    Jamie [name] is a 62 y.o. female with past medical history significant for GERD, hypertension, hyperlipidemia, pulmonary hypertension, CHF, pre-diabetes presents to the ED complaining of chest tightness that began around 11AM.  Patient reports she was at work eating caramel candy when her symptoms began.  Endorses chest heaviness, "lump in throat", congestion, and headache.  Denies cough, shortness of breath, palpitations, syncope, weakness.         Home Medications Prior to Admission medications   Medication Sig Start Date End Date Taking? Authorizing Provider  acetaminophen (TYLENOL) 500 MG tablet Take 1,000 mg by mouth every 6 (six) hours as needed for moderate pain.    [provider]  atorvastatin (LIPITOR) 80 MG tablet TAKE 1 TABLET BY MOUTH EVERY DAY 08/29/22   Philip Aspen, Limmie Patricia, MD  bismuth subsalicylate (PEPTO BISMOL) 262 MG/15ML suspension Take 30 mLs by mouth every 6 (six) hours as needed for diarrhea or loose stools or indigestion.    [provider]  Camphor-Eucalyptus-Menthol (VICKS VAPORUB EX) Apply 1 Application topically at bedtime as needed (congestion).    [provider]  carvedilol (COREG) 12.5 MG tablet TAKE 1 TABLET (12.5MG  TOTAL) BY MOUTH TWICE A DAY WITH MEALS 08/29/22   Philip Aspen, Limmie Patricia, MD  Cholecalciferol (VITAMIN D3) 50 MCG (2000 UT) TABS Take by mouth.    [provider]  Dextromethorphan-GG-APAP (CORICIDIN HBP COLD/COUGH/FLU) 40-981-191 MG/15ML LIQD Take 1 Dose by mouth daily as needed (congestion / cold symptoms).    [provider]  DM-Doxylamine-Acetaminophen (CORICIDIN HBP NIGHTTIME COLD PO) Take 2 tablets by mouth daily as needed (congestion).    [provider]  furosemide (LASIX) 20  MG tablet TAKE 1 TABLET BY MOUTH EVERY DAY 03/06/22   Philip Aspen, Limmie Patricia, MD  gabapentin (NEURONTIN) 300 MG capsule Take 300 mg by mouth daily.    [provider]  glucosamine-chondroitin 500-400 MG tablet Take 1 tablet by mouth 3 (three) times daily.    [provider]  hydrALAZINE (APRESOLINE) 100 MG tablet TAKE 1 TABLET BY MOUTH 3 TIMES DAILY. 08/29/22   Philip Aspen, Limmie Patricia, MD  losartan (COZAAR) 100 MG tablet TAKE 1 TABLET BY MOUTH EVERY DAY 08/29/22   Philip Aspen, Limmie Patricia, MD  meloxicam (MOBIC) 15 MG tablet Take 15 mg by mouth daily.    [provider]  Multiple Vitamin (MULTIVITAMIN WITH MINERALS) TABS tablet Take 1 tablet by mouth daily.    [provider]  Na Sulfate-K Sulfate-Mg Sulf 17.5-3.13-1.6 GM/177ML SOLN Take 1 kit by mouth once for 1 dose. May use generic Suprep, no Prior Authorization; Use Singlecare or Good RX. 11/21/22 11/21/22  Meryl Dare, MD  ondansetron (ZOFRAN-ODT) 4 MG disintegrating tablet Take 4 mg by mouth every 8 (eight) hours as needed for nausea or vomiting. Patient not taking: Reported on 10/03/2022    [provider]  topiramate (TOPAMAX) 50 MG tablet Take 1 tablet (50 mg total) by mouth at bedtime. 10/03/22   Drema Dallas, DO      Allergies    Patient has no known allergies.    Review of Systems   Review of Systems  Constitutional:  Negative for fever.  HENT:  Positive for congestion.   Respiratory:  Negative for cough and  shortness of breath.   Cardiovascular:  Positive for chest pain. Negative for palpitations.  Genitourinary:  Positive for frequency.  Neurological:  Negative for syncope and weakness.    Physical Exam Updated Vital Signs BP (!) 150/87   Pulse 81   Temp 98.2 F (36.8 C) (Oral)   Resp (!) 23   LMP  (LMP Unknown)   SpO2 100%  Physical Exam  ED Results / Procedures / Treatments   Labs (all labs ordered are listed, but only abnormal results are displayed) Labs  Reviewed  BASIC METABOLIC PANEL - Abnormal; Notable for the following components:      Result Value   Glucose, Bld 123 (*)    All other components within normal limits  URINALYSIS, W/ REFLEX TO CULTURE (INFECTION SUSPECTED) - Abnormal; Notable for the following components:   Color, Urine COLORLESS (*)    Leukocytes,Ua TRACE (*)    All other components within normal limits  RESP PANEL BY RT-PCR (RSV, FLU A&B, COVID)  RVPGX2  CBC  BRAIN NATRIURETIC PEPTIDE  TROPONIN I (HIGH SENSITIVITY)  TROPONIN I (HIGH SENSITIVITY)    EKG None  Radiology DG Chest 2 View  Result Date: 11/21/2022 CLINICAL DATA:  Chest pain.  Shortness of breath. EXAM: CHEST - 2 VIEW COMPARISON:  06/14/2021. FINDINGS: Bilateral lung fields are clear. Bilateral costophrenic angles are clear. Normal cardio-mediastinal silhouette. No acute osseous abnormalities. The soft tissues are within normal limits. IMPRESSION: No active cardiopulmonary disease. Electronically Signed   By: Jules Schick M.D.   On: 11/21/2022 16:35    Procedures Procedures    Medications Ordered in ED Medications - No data to display  ED Course/ Medical Decision Making/ A&P                                 Medical Decision Making Amount and/or Complexity of Data Reviewed Labs: ordered. Radiology: ordered.   This patient presents to the ED with chief complaint(s) of multiple symptoms with pertinent past medical history of CHF, HTN, HLD.  The complaint involves an extensive differential diagnosis and also carries with it a high risk of complications and morbidity.    The differential diagnosis includes ACS, CHF exacerbation, viral syndrome, metabolic derangement   The initial plan is to obtain labs, swab, chest x-ray  Initial Assessment:   On exam, patient is resting comfortably in bed and does not appear to be in acute respiratory distress.  Lungs are clear to auscultation bilaterally with adequate tidal volume.  Heart rate is normal  around 80 with regular rhythm.  Skin is warm and dry.  Patient has congestion, no post nasal drip or rhinorrhea.  Vitals are stable and patient is afebrile.    Independent ECG/labs interpretation:  The following labs were independently interpreted:  ECG demonstrates sinus rhythm without ectopy, ischemia, or infarction. Negative for Covid, influenza, RSV.  Troponin negative.  CBC without leukocytosis or anemia.   Metabolic panel with mildly elevated glucose, otherwise no other abnormality.  BNP within normal limits.  Urine negative for infection.    Independent visualization and interpretation of imaging: I independently visualized the following imaging with scope of interpretation limited to determining acute life threatening conditions related to emergency care: chest x-ray, which revealed no acute cardiopulmonary process.    Treatment and Reassessment: Upon reassessment, patient reports she is "ready to go".  Will need to get 2nd troponin.  During reassessment, patient also reports urinary frequency,  will check urine.   Patient given water.  She tolerated this without difficulty.   Disposition:   Patient's workup is overall reassuring.  Suspect her symptoms may be related to a viral syndrome as she has multiple symptoms with sudden onset this morning (congestion, chest heaviness, throat irritation, headache).  Recommended Tylenol as needed.    The patient has been appropriately medically screened and/or stabilized in the ED. I have low suspicion for any other emergent medical condition which would require further screening, evaluation or treatment in the ED or require inpatient management. At time of discharge the patient is hemodynamically stable and in no acute distress. I have discussed work-up results and diagnosis with patient and answered all questions. Patient is agreeable with discharge plan. We discussed strict return precautions for returning to the emergency department and they  verbalized understanding.           Final Clinical Impression(s) / ED Diagnoses Final diagnoses:  Multiple complaints  Chest heaviness  Acute viral syndrome    Rx / DC Orders ED Discharge Orders     None         Lenard Simmer, PA-C 11/21/22 1644    Margarita Grizzle, MD 11/22/22 1152

## 2022-11-21 NOTE — ED Notes (Signed)
Out to XR 

## 2022-11-21 NOTE — ED Notes (Signed)
Returns from XR. Family assists to restroom.

## 2022-11-26 ENCOUNTER — Other Ambulatory Visit: Payer: Self-pay | Admitting: Internal Medicine

## 2022-11-26 DIAGNOSIS — I10 Essential (primary) hypertension: Secondary | ICD-10-CM

## 2022-11-30 ENCOUNTER — Other Ambulatory Visit: Payer: Self-pay | Admitting: Internal Medicine

## 2022-11-30 ENCOUNTER — Telehealth: Payer: Self-pay | Admitting: Internal Medicine

## 2022-11-30 DIAGNOSIS — I10 Essential (primary) hypertension: Secondary | ICD-10-CM

## 2022-11-30 DIAGNOSIS — I5032 Chronic diastolic (congestive) heart failure: Secondary | ICD-10-CM

## 2022-11-30 DIAGNOSIS — E785 Hyperlipidemia, unspecified: Secondary | ICD-10-CM

## 2022-11-30 MED ORDER — NA SULFATE-K SULFATE-MG SULF 17.5-3.13-1.6 GM/177ML PO SOLN: ORAL | 0 refills | Status: DC

## 2022-11-30 NOTE — Telephone Encounter (Signed)
Prep not at pharmacy though I can see on outside med list  Resent  Meds ordered this encounter  Medications   Na Sulfate-K Sulfate-Mg Sulf 17.5-3.13-1.6 GM/177ML SOLN    Sig: Take as directed for colonoscopy    Dispense:  354 mL    Refill:  0

## 2022-12-02 ENCOUNTER — Encounter: Payer: Self-pay | Admitting: Certified Registered Nurse Anesthetist

## 2022-12-03 ENCOUNTER — Encounter: Payer: Self-pay | Admitting: Gastroenterology

## 2022-12-03 ENCOUNTER — Ambulatory Visit: Payer: 59 | Admitting: Gastroenterology

## 2022-12-03 VITALS — BP 123/75 | HR 59 | Temp 97.3°F | Resp 18 | Ht 70.0 in | Wt 277.0 lb

## 2022-12-03 DIAGNOSIS — D122 Benign neoplasm of ascending colon: Secondary | ICD-10-CM

## 2022-12-03 DIAGNOSIS — K640 First degree hemorrhoids: Secondary | ICD-10-CM

## 2022-12-03 DIAGNOSIS — D120 Benign neoplasm of cecum: Secondary | ICD-10-CM | POA: Diagnosis not present

## 2022-12-03 DIAGNOSIS — Z1211 Encounter for screening for malignant neoplasm of colon: Secondary | ICD-10-CM | POA: Diagnosis present

## 2022-12-03 DIAGNOSIS — Z860101 Personal history of adenomatous and serrated colon polyps: Secondary | ICD-10-CM

## 2022-12-03 MED ORDER — SODIUM CHLORIDE 0.9 % IV SOLN: 500.0000 mL | Freq: Once | INTRAVENOUS | Status: AC

## 2022-12-03 NOTE — Progress Notes (Signed)
Recent ED visit noted, Anesthesia and Dr. Russella Dar are ok to proceed with procedure today.

## 2022-12-03 NOTE — Op Note (Signed)
[facility] Patient Name: Jamie [name] Procedure Date: 12/03/2022 10:46 AM MRN: [id number] Endoscopist: Meryl Dare , MD, [phone] Age: 62 Referring MD:  Date of Birth: February 08, 1960 Gender: Female Account #: [id number] Procedure:                Colonoscopy Indications:              Surveillance: Personal history of adenomatous                            polyps on last colonoscopy 5 years ago Medicines:                Monitored Anesthesia Care Procedure:                Pre-Anesthesia Assessment:                           - Prior to the procedure, a History and Physical                            was performed, and patient medications and                            allergies were reviewed. The patient's tolerance of                            previous anesthesia was also reviewed. The risks                            and benefits of the procedure and the sedation                            options and risks were discussed with the patient.                            All questions were answered, and informed consent                            was obtained. Prior Anticoagulants: The patient has                            taken no anticoagulant or antiplatelet agents. ASA                            Grade Assessment: III - A patient with severe                            systemic disease. After reviewing the risks and                            benefits, the patient was deemed in satisfactory                            condition to undergo the procedure.  After obtaining informed consent, the colonoscope                            was passed under direct vision. Throughout the                            procedure, the patient's blood pressure, pulse, and                            oxygen saturations were monitored continuously. The                            Olympus Scope SN: [id number] was introduced through                            the anus and advanced  to the the cecum, identified                            by appendiceal orifice and ileocecal valve. The                            ileocecal valve, appendiceal orifice, and rectum                            were photographed. The quality of the bowel                            preparation was good. The colonoscopy was performed                            without difficulty. The patient tolerated the                            procedure well. Scope In: 10:51:29 AM Scope Out: 11:11:17 AM Scope Withdrawal Time: 0 hours 15 minutes 3 seconds  Total Procedure Duration: 0 hours 19 minutes 48 seconds  Findings:                 The perianal and digital rectal examinations were                            normal.                           A 6 mm polyp was found in the ascending colon. The                            polyp was sessile. The polyp was removed with a                            cold snare. Resection and retrieval were complete.                           Two sessile polyps were found in the ascending  colon and cecum. The polyps were 3 mm in size.                            These polyps were removed with a cold biopsy                            forceps. Resection and retrieval were complete.                           External and internal hemorrhoids were found during                            retroflexion. The hemorrhoids were small and Grade                            I (internal hemorrhoids that do not prolapse).                           The exam was otherwise without abnormality on                            direct and retroflexion views. Complications:            No immediate complications. Estimated blood loss:                            None. Estimated Blood Loss:     Estimated blood loss: none. Impression:               - One 6 mm polyp in the ascending colon, removed                            with a cold snare. Resected and retrieved.                            - Two 3 mm polyps in the ascending colon and in the                            cecum, removed with a cold biopsy forceps. Resected                            and retrieved.                           - External and internal hemorrhoids.                           - The examination was otherwise normal on direct                            and retroflexion views. Recommendation:           - Repeat colonoscopy after studies are complete for  surveillance based on pathology results.                           - Patient has a contact number available for                            emergencies. The signs and symptoms of potential                            delayed complications were discussed with the                            patient. Return to normal activities tomorrow.                            Written discharge instructions were provided to the                            patient.                           - Resume previous diet.                           - Continue present medications.                           - Await pathology results. Meryl Dare, MD 12/03/2022 11:14:15 AM This report has been signed electronically.

## 2022-12-03 NOTE — Patient Instructions (Signed)
Educational handout provided to patient related to Hemorrhoids and Polyps  Resume previous diet  Continue present medications  Awaiting pathology results   YOU HAD AN ENDOSCOPIC PROCEDURE TODAY AT THE [facility]:   Refer to the procedure report that was given to you for any specific questions about what was found during the examination.  If the procedure report does not answer your questions, please call your gastroenterologist to clarify.  If you requested that your care partner not be given the details of your procedure findings, then the procedure report has been included in a sealed envelope for you to review at your convenience later.  YOU SHOULD EXPECT: Some feelings of bloating in the abdomen. Passage of more gas than usual.  Walking can help get rid of the air that was put into your GI tract during the procedure and reduce the bloating. If you had a lower endoscopy (such as a colonoscopy or flexible sigmoidoscopy) you may notice spotting of blood in your stool or on the toilet paper. If you underwent a bowel prep for your procedure, you may not have a normal bowel movement for a few days.  Please Note:  You might notice some irritation and congestion in your nose or some drainage.  This is from the oxygen used during your procedure.  There is no need for concern and it should clear up in a day or so.  SYMPTOMS TO REPORT IMMEDIATELY:  Following lower endoscopy (colonoscopy or flexible sigmoidoscopy):  Excessive amounts of blood in the stool  Significant tenderness or worsening of abdominal pains  Swelling of the abdomen that is new, acute  Fever of 100F or higher  For urgent or emergent issues, a gastroenterologist can be reached at any hour by calling [phone]. Do not use MyChart messaging for urgent concerns.    DIET:  We do recommend a small meal at first, but then you may proceed to your regular diet.  Drink plenty of fluids but you should avoid alcoholic  beverages for 24 hours.  ACTIVITY:  You should plan to take it easy for the rest of today and you should NOT DRIVE or use heavy machinery until tomorrow (because of the sedation medicines used during the test).    FOLLOW UP: Our staff will call the number listed on your records the next business day following your procedure.  We will call around 7:15- 8:00 am to check on you and address any questions or concerns that you may have regarding the information given to you following your procedure. If we do not reach you, we will leave a message.     If any biopsies were taken you will be contacted by phone or by letter within the next 1-3 weeks.  Please call us at [phone] if you have not heard about the biopsies in 3 weeks.    SIGNATURES/CONFIDENTIALITY: You and/or your care partner have signed paperwork which will be entered into your electronic medical record.  These signatures attest to the fact that that the information above on your After Visit Summary has been reviewed and is understood.  Full responsibility of the confidentiality of this discharge information lies with you and/or your care-partner.

## 2022-12-03 NOTE — Progress Notes (Signed)
1055 BP  163/107, Labetalol given IV, MD update, vss

## 2022-12-03 NOTE — Progress Notes (Signed)
See 11/21/2022 H&P no changes

## 2022-12-03 NOTE — Progress Notes (Signed)
Called to room to assist during endoscopic procedure.  Patient ID and intended procedure confirmed with present staff. Received instructions for my participation in the procedure from the performing physician.  

## 2022-12-03 NOTE — Progress Notes (Signed)
1047 BP 208/110, Labetalol given IV, MD update, vss

## 2022-12-03 NOTE — Progress Notes (Signed)
Report given to PACU, vss 

## 2022-12-04 ENCOUNTER — Telehealth: Payer: Self-pay | Admitting: *Deleted

## 2022-12-04 NOTE — Telephone Encounter (Signed)
  Follow up Call-     12/03/2022   10:28 AM  Call back number  Post procedure Call Back phone  # [phone]  Permission to leave phone message Yes     Patient questions:  Do you have a fever, pain , or abdominal swelling? No. Pain Score  0 *  Have you tolerated food without any problems? Yes.    Have you been able to return to your normal activities? Yes.    Do you have any questions about your discharge instructions: Diet   No. Medications  No. Follow up visit  No.  Do you have questions or concerns about your Care? No.  Actions: * If pain score is 4 or above: No action needed, pain <4.

## 2022-12-05 LAB — SURGICAL PATHOLOGY

## 2022-12-10 ENCOUNTER — Encounter: Payer: Self-pay | Admitting: Gastroenterology

## 2022-12-19 ENCOUNTER — Ambulatory Visit: Payer: 59 | Attending: Cardiology | Admitting: Cardiology

## 2022-12-19 ENCOUNTER — Ambulatory Visit (INDEPENDENT_AMBULATORY_CARE_PROVIDER_SITE_OTHER): Payer: 59

## 2022-12-19 ENCOUNTER — Other Ambulatory Visit: Payer: Self-pay | Admitting: Cardiology

## 2022-12-19 ENCOUNTER — Encounter: Payer: Self-pay | Admitting: Cardiology

## 2022-12-19 VITALS — BP 158/88 | HR 67 | Ht 70.5 in | Wt 280.0 lb

## 2022-12-19 DIAGNOSIS — G4733 Obstructive sleep apnea (adult) (pediatric): Secondary | ICD-10-CM

## 2022-12-19 DIAGNOSIS — I10 Essential (primary) hypertension: Secondary | ICD-10-CM

## 2022-12-19 DIAGNOSIS — I5032 Chronic diastolic (congestive) heart failure: Secondary | ICD-10-CM | POA: Diagnosis not present

## 2022-12-19 DIAGNOSIS — R002 Palpitations: Secondary | ICD-10-CM

## 2022-12-19 DIAGNOSIS — I701 Atherosclerosis of renal artery: Secondary | ICD-10-CM | POA: Diagnosis not present

## 2022-12-19 DIAGNOSIS — E7800 Pure hypercholesterolemia, unspecified: Secondary | ICD-10-CM

## 2022-12-19 DIAGNOSIS — I272 Pulmonary hypertension, unspecified: Secondary | ICD-10-CM

## 2022-12-19 MED ORDER — FUROSEMIDE 20 MG PO TABS: ORAL_TABLET | ORAL | 3 refills | Status: DC

## 2022-12-19 MED ORDER — PANTOPRAZOLE SODIUM 40 MG PO TBEC: 40.0000 mg | DELAYED_RELEASE_TABLET | Freq: Every day | ORAL | 3 refills | Status: AC

## 2022-12-19 NOTE — Addendum Note (Signed)
Addended by: Sherle Poe R on: 12/19/2022 09:39 AM   Modules accepted: Orders

## 2022-12-19 NOTE — Patient Instructions (Addendum)
Medication Instructions:  START Protonix (pantoprazole) 40 mg once daily RE-START Lasix (furosemide) 20 mg once daily, may take an additional 20 mg tablet daily as needed for lower extremity edema  *If you need a refill on your cardiac medications before your next appointment, please call your pharmacy*   Lab Work: CMET and Lipids on Monday - this can be completed at any [facility], no appointment required but does need to be fasting - there is a Chief Operating Officer on the first floor of our [address] building on [employer] If you have labs (blood work) drawn today and your tests are completely normal, you will receive your results only by: [employer] (if you have MyChart) OR A paper copy in the mail If you have any lab test that is abnormal or we need to change your treatment, we will call you to review the results.   Testing/Procedures: Zio Cardiac Event Monitor - wear for 14 days Your physician has recommended that you wear an event monitor. Event monitors are medical devices that record the heart's electrical activity. Doctors most often [place] these monitors to diagnose arrhythmias. Arrhythmias are problems with the speed or rhythm of the heartbeat. The monitor is a small, portable device. You can wear one while you do your normal daily activities. This is usually used to diagnose what is causing palpitations/syncope (passing out).  Renal Artery Duplex Your physician has requested that you have a renal artery duplex. During this test, an ultrasound is used to evaluate blood flow to the kidneys. Allow one hour for this exam. Do not eat after midnight the day before and avoid carbonated beverages. Take your medications as you usually do.    Follow-Up: At [facility], you and your health needs are our priority.  As part of our continuing mission to provide you with exceptional heart care, we have created designated Provider Care Teams.  These Care Teams include your primary Cardiologist  (physician) and Advanced Practice Providers (APPs -  Physician Assistants and Nurse Practitioners) who all work together to provide you with the care you need, when you need it.  We recommend signing up for the patient portal called "MyChart".  Sign up information is provided on this After Visit Summary.  MyChart is used to connect with patients for Virtual Visits (Telemedicine).  Patients are able to view lab/test results, encounter notes, upcoming appointments, etc.  Non-urgent messages can be sent to your provider as well.   To learn more about what you can do with MyChart, go to [email or web address].    Your next appointment:   6 week(s)  Provider:   Jari Favre, PA-C, Ronie Spies, PA-C, Robin Searing, NP, Jacolyn Reedy, PA-C, Eligha Bridegroom, NP, Tereso Newcomer, PA-C, or Perlie Gold, PA-C     Then, Armanda Magic, MD will plan to see you again in 1 year(s).     ZIO XT- Long Term Monitor Instructions  Your physician has requested you wear a ZIO patch monitor for 14 days.  This is a single patch monitor. [employer] supplies one patch monitor per enrollment. Additional stickers are not available. Please do not apply patch if you will be having a Nuclear Stress Test,  Echocardiogram, Cardiac CT, MRI, or Chest Xray during the period you would be wearing the  monitor. The patch cannot be worn during these tests. You cannot remove and re-apply the  ZIO XT patch monitor.  Your ZIO patch monitor will be mailed 3 day USPS to your address on file. It may  take 3-5 days  to receive your monitor after you have been enrolled.  Once you have received your monitor, please review the enclosed instructions. Your monitor  has already been registered assigning a specific monitor serial # to you.  Billing and Patient Assistance Program Information  We have supplied [employer] with any of your insurance information on file for billing purposes. [employer] offers a sliding scale Patient Assistance Program for  patients that do not have  insurance, or whose insurance does not completely cover the cost of the ZIO monitor.  You must apply for the Patient Assistance Program to qualify for this discounted rate.  To apply, please call [employer] at [phone], select option 4, select option 2, ask to apply for  Patient Assistance Program. Meredeth Ide will ask your household income, and how many people  are in your household. They will quote your out-of-pocket cost based on that information.  [employer] will also be able to set up a 68-month, interest-free payment plan if needed.  Applying the monitor   Shave hair from upper left chest.  Hold abrader disc by orange tab. Rub abrader in 40 strokes over the upper left chest as  indicated in your monitor instructions.  Clean area with 4 enclosed alcohol pads. Let dry.  Apply patch as indicated in monitor instructions. Patch will be placed under collarbone on left  side of chest with arrow pointing upward.  Rub patch adhesive wings for 2 minutes. Remove white label marked "1". Remove the white  label marked "2". Rub patch adhesive wings for 2 additional minutes.  While looking in a mirror, press and release button in center of patch. A small green light will  flash 3-4 times. This will be your only indicator that the monitor has been turned on.  Do not shower for the first 24 hours. You may shower after the first 24 hours.  Press the button if you feel a symptom. You will hear a small click. Record Date, Time and  Symptom in the Patient Logbook.  When you are ready to remove the patch, follow instructions on the last 2 pages of Patient  Logbook. Stick patch monitor onto the last page of Patient Logbook.  Place Patient Logbook in the blue and white box. Use locking tab on box and tape box closed  securely. The blue and white box has prepaid postage on it. Please place it in the mailbox as  soon as possible. Your physician should have your test results approximately  7 days after the  monitor has been mailed back to [facility].  Call [facility] Customer Care at [phone] if you have questions regarding  your ZIO XT patch monitor. Call them immediately if you see an orange light blinking on your  monitor.  If your monitor falls off in less than 4 days, contact our Monitor department at [phone].  If your monitor becomes loose or falls off after 4 days call [employer] at [phone] for  suggestions on securing your monitor

## 2022-12-19 NOTE — Progress Notes (Signed)
Date:  12/19/2022   ID:  Jamie [name], DOB Jun 12, 1960, MRN [id number]   PCP:  Philip Aspen, Limmie Patricia, MD  Cardiologist:  Armanda Magic, MD  Electrophysiologist:  None   Chief Complaint:  Chest pain and SOB  History of Present Illness:    Jamie [name] is a 62 y.o. female  with a hx of CP in the past with false positive myoview and normal coronary arteries in 2016.  She has a hx of GERD, HTN, HLD and MVP by remote echo.  She saw me in Jan 2021 with recurrent CP and DOE.  2D echo Aug 2020 was normal with G1DD.  CP was atypical (sharp and stabbing) and belching improved her sx but sometimes would radiate into her left arm with tingling.  She also complained of some palpitations.  She underwent coronary CTA showing a Ca++ score of 0 and no CAD.  Repeat 2D echo showed normal LVF with G2DD and mild pulmonary HTN with PASP and mildly dilated ascending aorta at 38mm.   Pro-BNP was normal. Her renal dopplers in 03/2020 showed 1-59% left RAS.  She also has OSA and a sleep study showed severe OSA with an AHI of 72/hr and was started on auto CPAP.  Unfortunately she stopped using her device at last OV because she said it hurt her teeth.  Unfortunately she never followup with getting a new mask and due to noncompliance her insurance company made her hand it back in.    She underwent repeat sleep study to get a new device as she lost her other one due to noncompliance. Sleep study showed severe OSA with an AHI of 36/hr with nocturnal hypoxemia with lowest O2sat 78%.  She was placed back on CPAP.    She is here today for followup and is doing well.  She had breast reduction surgery in Jan 2024 and over the past few months has had some tightness in her chest that she has a very hard time describing.  It is nonexertional.  She has had a lot more heartburn lately.  She takes Meloxicam daily for arthritis. She denies any SOB, DOE, PND, orthopnea or syncope. She has chronic LE edema that she thinks  is slightly worse after stopping her water pill.  She has been traveling a lot which seems to exacerbate it.  She has been having palpitations that occur 2 times weekly lasting a few minutes at a time. She is compliant with her meds and is tolerating meds with no SE.    She is doing well with her PAP device and thinks that she has gotten used to it.  She tolerates the nasal pillow mask and feels the pressure is adequate.  Since going on PAP she feels rested in the am and has no significant daytime sleepiness.  She denies any significant mouth or nasal dryness or nasal congestion.  She does not think that he snores.    Prior CV studies:   The following studies were reviewed today:  2D echo, sleep study, PAP compliance download  Past Medical History:  Diagnosis Date   Acquired dilation of ascending aorta and aortic root (HCC)    38mm by 2D echo 03/2019   Allergy    Anxiety    Aortic valve disease    functionally bicuspid AV with fusion of the right and left coronary cusps with no AS by echo 03/2020   Arthritis    in back   Chest pain  neg cath 2016 after false positive myoview   CHF (congestive heart failure) (HCC)    Depression    Diabetes mellitus without complication (HCC)    type 2, no meds   Dyspnea    with exertion   GERD (gastroesophageal reflux disease)    Hyperglycemia    Hyperlipidemia    Hypertension    Joint pain    Knee pain    Low back pain    Migraine    Mitral valve prolapse    Muscle cramps    Pulmonary HTN (HCC)    mild with PASP by echo 03/2019 but on echo 03/2020   Sleep apnea    uses cpap   Past Surgical History:  Procedure Laterality Date   BACK SURGERY     BREAST BIOPSY     BREAST REDUCTION SURGERY Bilateral 01/04/2022   Procedure: BILATERAL BREAST REDUCTION;  Surgeon: Peggye Form, DO;  Location: [facility] OR;  Service: Plastics;  Laterality: Bilateral;   CESAREAN SECTION     x 1   COLONOSCOPY     LEFT HEART CATHETERIZATION WITH  CORONARY ANGIOGRAM N/A 07/08/2013   Procedure: LEFT HEART CATHETERIZATION WITH CORONARY ANGIOGRAM;  Surgeon: Wendall Stade, MD;  Location: [facility] CATH LAB;  Service: Cardiovascular;  Laterality: N/A;   TUBAL LIGATION       Current Meds  Medication Sig   acetaminophen (TYLENOL) 500 MG tablet Take 1,000 mg by mouth every 6 (six) hours as needed for moderate pain.   atorvastatin (LIPITOR) 80 MG tablet TAKE 1 TABLET BY MOUTH EVERY DAY   bismuth subsalicylate (PEPTO BISMOL) 262 MG/15ML suspension Take 30 mLs by mouth every 6 (six) hours as needed for diarrhea or loose stools or indigestion.   Camphor-Eucalyptus-Menthol (VICKS VAPORUB EX) Apply 1 Application topically at bedtime as needed (congestion).   carvedilol (COREG) 12.5 MG tablet TAKE 1 TABLET (12.5MG  TOTAL) BY MOUTH TWICE A DAY WITH MEALS   Cholecalciferol (VITAMIN D3) 50 MCG (2000 UT) TABS Take by mouth.   Dextromethorphan-GG-APAP (CORICIDIN HBP COLD/COUGH/FLU) 10-200-325 MG/15ML LIQD Take 1 Dose by mouth daily as needed (congestion / cold symptoms).   DM-Doxylamine-Acetaminophen (CORICIDIN HBP NIGHTTIME COLD PO) Take 2 tablets by mouth daily as needed (congestion).   furosemide (LASIX) 20 MG tablet TAKE 1 TABLET BY MOUTH EVERY DAY   gabapentin (NEURONTIN) 300 MG capsule Take 300 mg by mouth daily.   glucosamine-chondroitin 500-400 MG tablet Take 1 tablet by mouth 3 (three) times daily.   hydrALAZINE (APRESOLINE) 100 MG tablet TAKE 1 TABLET BY MOUTH THREE TIMES A DAY   losartan (COZAAR) 100 MG tablet TAKE 1 TABLET BY MOUTH EVERY DAY   meloxicam (MOBIC) 15 MG tablet Take 15 mg by mouth daily.   Multiple Vitamin (MULTIVITAMIN WITH MINERALS) TABS tablet Take 1 tablet by mouth daily.   Current Facility-Administered Medications for the 12/19/22 encounter (Office Visit) with Quintella Reichert, MD  Medication   0.9 %  sodium chloride infusion     Allergies:   Patient has no known allergies.   Social History   Tobacco Use   Smoking status:  Former    Current packs/day: 0.00    Types: Cigarettes    Quit date: 07/31/1996    Years since quitting: 26.4    Passive exposure: Past   Smokeless tobacco: Never  Vaping Use   Vaping status: Never Used  Substance Use Topics   Alcohol use: Yes    Comment: very little   Drug use: No  Family Hx: The patient's family history includes AAA (abdominal aortic aneurysm) in her mother; Breast cancer in her cousin; Colon cancer in her cousin; Hyperlipidemia in her brother and mother; Hypertension in her brother, father, and mother; Liver cancer in her maternal aunt; Stroke in her father. There is no history of Colon polyps, Esophageal cancer, Stomach cancer, or Rectal cancer.  ROS:   Please see the history of present illness.     All other systems reviewed and are negative.   Labs/Other Tests and Data Reviewed:    Recent Labs: 11/21/2022: B Natriuretic Peptide 57.3; BUN 14; Creatinine, Ser 0.77; Hemoglobin 13.5; Platelets 238; Potassium 3.6; Sodium 140   Recent Lipid Panel Lab Results  Component Value Date/Time   CHOL 133 05/31/2021 08:19 AM   CHOL 193 10/01/2017 11:50 AM   TRIG 74.0 05/31/2021 08:19 AM   HDL 59.40 05/31/2021 08:19 AM   HDL 51 10/01/2017 11:50 AM   CHOLHDL 2 05/31/2021 08:19 AM   LDLCALC 59 05/31/2021 08:19 AM   LDLCALC 47 09/04/2019 08:03 AM    Wt Readings from Last 3 Encounters:  12/19/22 280 lb (127 kg)  12/03/22 277 lb (125.6 kg)  11/21/22 277 lb (125.6 kg)     Objective:    Vital Signs:  BP (!) 158/88   Pulse 67   Ht 5' 10.5" (1.791 m)   Wt 280 lb (127 kg)   LMP  (LMP Unknown)   SpO2 96%   BMI 39.61 kg/m    GEN: Well nourished, well developed in no acute distress HEENT: Normal NECK: No JVD; No carotid bruits LYMPHATICS: No lymphadenopathy CARDIAC:RRR, no murmurs, rubs, gallops RESPIRATORY:  Clear to auscultation without rales, wheezing or rhonchi  ABDOMEN: Soft, non-tender, non-distended MUSCULOSKELETAL:  No edema; No deformity  SKIN:  Warm and dry NEUROLOGIC:  Alert and oriented x 3 PSYCHIATRIC:  Normal affect  ASSESSMENT & PLAN:    OSA - The patient is tolerating PAP therapy well without any problems. The PAP download performed by his DME was personally reviewed and interpreted by me today and showed an AHI of 0.3/hr on 10 cm H2O with 100% compliance in using more than 4 hours nightly.  The patient has been using and benefiting from PAP use and will continue to benefit from therapy.   Chronic diastolic CHF -she has chronic LE edema that is stable and worse with sitting and traveling>>she has had problems with low Na compliance in the past and she also ran out of her diuretic -encouraged her to use compression hose to help with LE edema -I encouraged her to avoid any added salt and follow a strict <2gm Na diet -restart Lasix 20mg  daily with an additional 20mg  PRN for increased edema -I have personally reviewed and interpreted outside labs performed by patient's PCP which showed SCr 0.77 and K+ 3.6 -check BMET in 1 week  HTN/Left renal artery stenosis -BP borderline controlled today but has been off her diuretic  -renal dopplers showed 1-59% left non obstructing renal artery stenosis -continue prescription drug management with  Carvedilol 12.5mg  BID, Hydralazine 100mg  TID and Losartan 100mg  daily with PRN refills -restart Lasix 20mg  daily -repeat renal doppler to assess for progression of RAS -I have asked her to check her BP daily for a week and call with results  Palpitations -Event monitor was normal -she has a [employer] device and I encouraged her to send me the strips to review -I will get a 2 week ziopatch to assess for arrhythmias  Pulmonary HTN -mildly elevated and likely related to G2DD/OSA -repeat echo 03/2020 showed normal PAP -home sleep study showed severe OSA -continue CPAP and diuretics  HLD -LDL goal < 70 due to renal artery stenosis -check FLP and ALT -continue prescription drug management  with atorvastatin 80mg  daily with PRN refills  Atypical CP -suspect it may be GERD as her heartburn has been worse recently -no CAD on coronary CTA in 2021 -will start Protonix 40mg  daily and followup with my extender in 4 weeks  Medication Adjustments/Labs and Tests Ordered: Current medicines are reviewed at length with the patient today.  Concerns regarding medicines are outlined above.  Tests Ordered: No orders of the defined types were placed in this encounter.  Medication Changes: No orders of the defined types were placed in this encounter.   Disposition:  Follow up 1 year  Signed, Armanda Magic, MD  12/19/2022 9:16 AM    [facility]

## 2022-12-19 NOTE — Progress Notes (Unsigned)
Enrolled for [employer] to mail a ZIO XT long term holter monitor to the patients address on file.  

## 2022-12-24 LAB — COMPREHENSIVE METABOLIC PANEL
ALT: 17 [IU]/L (ref 0–32)
AST: 13 [IU]/L (ref 0–40)
Albumin: 4.3 g/dL (ref 3.8–4.9)
Alkaline Phosphatase: 81 [IU]/L (ref 44–121)
BUN/Creatinine Ratio: 19 (ref 12–28)
BUN: 17 mg/dL (ref 8–27)
Bilirubin Total: 0.4 mg/dL (ref 0.0–1.2)
CO2: 24 mmol/L (ref 20–29)
Calcium: 9.4 mg/dL (ref 8.7–10.3)
Chloride: 105 mmol/L (ref 96–106)
Creatinine, Ser: 0.91 mg/dL (ref 0.57–1.00)
Globulin, Total: 2.6 g/dL (ref 1.5–4.5)
Glucose: 93 mg/dL (ref 70–99)
Potassium: 3.9 mmol/L (ref 3.5–5.2)
Sodium: 144 mmol/L (ref 134–144)
Total Protein: 6.9 g/dL (ref 6.0–8.5)
eGFR: 72 mL/min/{1.73_m2} (ref >59)

## 2022-12-24 LAB — LIPID PANEL
Chol/HDL Ratio: 2.3 {ratio} (ref 0.0–4.4)
Cholesterol, Total: 119 mg/dL (ref 100–199)
HDL: 52 mg/dL (ref >39)
LDL Chol Calc (NIH): 51 mg/dL (ref 0–99)
Triglycerides: 84 mg/dL (ref 0–149)
VLDL Cholesterol Cal: 16 mg/dL (ref 5–40)

## 2022-12-27 ENCOUNTER — Telehealth: Payer: Self-pay

## 2022-12-27 NOTE — Telephone Encounter (Signed)
-----   Message from Armanda Magic sent at 12/25/2022  6:47 AM EST ----- Please let patient know that labs were normal.  Continue current medical therapy.

## 2022-12-27 NOTE — Telephone Encounter (Signed)
Call to patient to advise of normal labs, patient verbalizes understanding.

## 2023-01-13 DIAGNOSIS — R002 Palpitations: Secondary | ICD-10-CM | POA: Diagnosis not present

## 2023-01-13 DIAGNOSIS — I5032 Chronic diastolic (congestive) heart failure: Secondary | ICD-10-CM

## 2023-01-13 DIAGNOSIS — G4733 Obstructive sleep apnea (adult) (pediatric): Secondary | ICD-10-CM | POA: Diagnosis not present

## 2023-01-18 ENCOUNTER — Ambulatory Visit (HOSPITAL_COMMUNITY)
Admission: RE | Admit: 2023-01-18 | Discharge: 2023-01-18 | Disposition: A | Discharge status: Home / Self Care | Payer: 59 | Source: Ambulatory Visit | Attending: Cardiology | Admitting: Cardiology

## 2023-01-18 DIAGNOSIS — I701 Atherosclerosis of renal artery: Secondary | ICD-10-CM | POA: Insufficient documentation

## 2023-01-18 DIAGNOSIS — G4733 Obstructive sleep apnea (adult) (pediatric): Secondary | ICD-10-CM | POA: Insufficient documentation

## 2023-01-18 DIAGNOSIS — I10 Essential (primary) hypertension: Secondary | ICD-10-CM | POA: Insufficient documentation

## 2023-01-18 DIAGNOSIS — I272 Pulmonary hypertension, unspecified: Secondary | ICD-10-CM | POA: Diagnosis present

## 2023-01-18 DIAGNOSIS — R002 Palpitations: Secondary | ICD-10-CM | POA: Diagnosis present

## 2023-01-18 DIAGNOSIS — I5032 Chronic diastolic (congestive) heart failure: Secondary | ICD-10-CM | POA: Insufficient documentation

## 2023-01-18 DIAGNOSIS — E7800 Pure hypercholesterolemia, unspecified: Secondary | ICD-10-CM | POA: Diagnosis present

## 2023-01-22 ENCOUNTER — Telehealth: Payer: Self-pay

## 2023-01-22 NOTE — Telephone Encounter (Signed)
-----   Message from Armanda Magic sent at 01/21/2023  1:57 PM EST ----- Renal Doppler showed no evidence of right renal artery stenosis and 1 to 59% left renal artery stenosis unchanged from 2022

## 2023-01-22 NOTE — Telephone Encounter (Signed)
Call to [employer] to advise of renal ultrasound results. No answer, left detailed message per DPR advising that Renal Doppler showed no evidence of right renal artery stenosis and 1 to 59% left renal artery stenosis unchanged from 2022. Asked patient to call our office if any questions.

## 2023-02-05 ENCOUNTER — Encounter: Payer: Self-pay | Admitting: Cardiology

## 2023-02-05 ENCOUNTER — Telehealth: Payer: Self-pay

## 2023-02-05 DIAGNOSIS — I4719 Other supraventricular tachycardia: Secondary | ICD-10-CM | POA: Insufficient documentation

## 2023-02-05 MED ORDER — METOPROLOL SUCCINATE ER 50 MG PO TB24: 50.0000 mg | ORAL_TABLET | Freq: Every day | ORAL | 3 refills | Status: DC

## 2023-02-05 NOTE — Telephone Encounter (Signed)
-----   Message from Wilbert Bihari sent at 02/05/2023 12:36 PM EST ----- Heart monitor showed 26 episodes of SVT lasting as long as 20 beats in a row with fastest heart rate 190 bpm.  I would like her to stop her carvedilol  and start Toprol -XL 50 mg daily.  Please have her follow-up with extender in 3 to 4 weeks.  Also please get a 2D echo

## 2023-02-05 NOTE — Telephone Encounter (Signed)
 Heart monitor showed 26 episodes of SVT lasting as long as 20 beats in a row with fastest heart rate 190 bpm.  I would like her to stop her carvedilol  and start Toprol -XL 50 mg daily.  Please have her follow-up with extender in 3 to 4 weeks.  Also please get a 2D echo

## 2023-02-07 ENCOUNTER — Ambulatory Visit (HOSPITAL_COMMUNITY): Payer: 59 | Attending: Internal Medicine

## 2023-02-07 ENCOUNTER — Encounter: Payer: Self-pay | Admitting: Cardiology

## 2023-02-07 DIAGNOSIS — I4719 Other supraventricular tachycardia: Secondary | ICD-10-CM | POA: Insufficient documentation

## 2023-02-07 DIAGNOSIS — I340 Nonrheumatic mitral (valve) insufficiency: Secondary | ICD-10-CM

## 2023-02-07 LAB — ECHOCARDIOGRAM COMPLETE
Area-P 1/2: 3.26 cm2
S' Lateral: 2.8 cm

## 2023-02-07 MED ORDER — PERFLUTREN LIPID MICROSPHERE
1.0000 mL | INTRAVENOUS | Status: AC | PRN
  Administered 2023-02-07: 2 mL via INTRAVENOUS

## 2023-02-18 ENCOUNTER — Telehealth: Payer: Self-pay

## 2023-02-18 DIAGNOSIS — I272 Pulmonary hypertension, unspecified: Secondary | ICD-10-CM

## 2023-02-18 DIAGNOSIS — I341 Nonrheumatic mitral (valve) prolapse: Secondary | ICD-10-CM

## 2023-02-18 NOTE — Telephone Encounter (Signed)
-----   Message from Armanda Magic sent at 02/07/2023  9:37 PM EST ----- Echo shows normal heart function with EF 65-70% with increased stiffness of heart muscle and moderately thickened heart muscle, mildly enlarged RV with mildly elevated pressures in the vessels of the lungs called pulmonary HTN, mild to moderately leaky MV>> the MR is new this echo but BP was significantly elevated during the echo. Please have her check her BP twice daily for a week at lunch and dinner and call with results.  Repeat echo in 1 year

## 2023-02-18 NOTE — Telephone Encounter (Signed)
Call to patient to explain echo shows normal heart function with EF 65-70% with increased stiffness of heart muscle and moderately thickened heart muscle, mildly enlarged RV with mildly elevated pressures in the vessels of the lungs called pulmonary HTN. Patient also verbalizes understanding of mild to moderately leaky MV, explained that  the MR is new this echo per Dr. Mayford Knife. Patient agrees to  check her BP twice daily for a week at lunch and dinner and call with results.  Repeat echo in 1 year ordered.

## 2023-02-20 ENCOUNTER — Other Ambulatory Visit: Payer: Self-pay | Admitting: Internal Medicine

## 2023-02-20 DIAGNOSIS — I10 Essential (primary) hypertension: Secondary | ICD-10-CM

## 2023-02-25 ENCOUNTER — Ambulatory Visit: Payer: 59 | Admitting: Physician Assistant

## 2023-02-26 ENCOUNTER — Ambulatory Visit: Payer: 59 | Admitting: Nurse Practitioner

## 2023-02-27 ENCOUNTER — Telehealth: Payer: Self-pay

## 2023-02-27 ENCOUNTER — Ambulatory Visit: Payer: 59 | Admitting: Physician Assistant

## 2023-02-27 NOTE — Telephone Encounter (Signed)
-----   Message from Nurse Alcario Drought E sent at 02/18/2023 10:33 AM EST ----- Regarding: BP log Pt needs to submit BP log as she was recently diagnosed with pulmonary hypertension and has a history of difficulty to control blood pressure.   Please make patient an office appointment if she is having difficulty tracking BP at home, or ask Dr. Mayford Knife if she wants to order 24 hour bP monitor.

## 2023-02-27 NOTE — Telephone Encounter (Signed)
 Left a message for the pt to call back.

## 2023-03-04 ENCOUNTER — Other Ambulatory Visit: Payer: Self-pay | Admitting: Internal Medicine

## 2023-03-04 DIAGNOSIS — E785 Hyperlipidemia, unspecified: Secondary | ICD-10-CM

## 2023-03-05 NOTE — Telephone Encounter (Signed)
 Reached pt, she reports that her BP cuff is not working and she has not been able to keep track.  She says she thinks it is remaining elevated.  Offered options, including a 24 hour bp monitor.  Pt prefers to get a new cuff when she gets pain and track.  Advised pt to call the office/send readings via mychart after monitoring for a week/two so that we can reevaluate tx plan.  Patient verbalized understanding and agreeable to plan.

## 2023-03-08 ENCOUNTER — Other Ambulatory Visit: Payer: Self-pay | Admitting: Internal Medicine

## 2023-03-08 DIAGNOSIS — I5032 Chronic diastolic (congestive) heart failure: Secondary | ICD-10-CM

## 2023-03-08 DIAGNOSIS — I10 Essential (primary) hypertension: Secondary | ICD-10-CM

## 2023-03-18 NOTE — Progress Notes (Signed)
Declined SDOH.

## 2023-03-20 NOTE — Progress Notes (Unsigned)
 Cardiology Office Note    Patient Name: Jamie [name] Date of Encounter: 03/21/2023  Primary Care Provider:  Philip Aspen, Limmie Patricia, MD Primary Cardiologist:  Armanda Magic, MD Primary Electrophysiologist: None   Past Medical History    Past Medical History:  Diagnosis Date   Acquired dilation of ascending aorta and aortic root (HCC)    38mm by 2D echo 03/2019   Allergy    Anxiety    Aortic valve disease    functionally bicuspid AV with fusion of the right and left coronary cusps with no AS by echo 03/2020   Arthritis    in back   Chest pain    neg cath 2016 after false positive myoview   CHF (congestive heart failure) (HCC)    Depression    Diabetes mellitus without complication (HCC)    type 2, no meds   Dyspnea    with exertion   GERD (gastroesophageal reflux disease)    Hyperglycemia    Hyperlipidemia    Hypertension    Joint pain    Knee pain    Low back pain    Migraine    Mitral regurgitation    mild to moderate by echo 02/2023   Mitral valve prolapse    Muscle cramps    PAT (paroxysmal atrial tachycardia) (HCC)    by Ziopatch 02/2023   Pulmonary HTN (HCC)    mild with PASP by echo 03/2019 but on echo 03/2020   Sleep apnea    uses cpap    History of Present Illness  Jamie [name] is a 63 y.o. female with PMH of HFpEF, bicuspid aortic valve, left renal artery stenosis (1-59%) HTN, HLD, moderate MVR, pulmonary hypertension,, GERD, OSA (on CPAP), ascending aneurysm (38 mm) who presents today for 6-week follow-up.  Jamie [name] was seen last on 12/19/2022 for follow-up.  During visit patient reported experiencing palpitations that occur 2 times weekly lasting for few minutes. She reported sodium indiscretions and has chronic lower extremity edema.  She was restarted on Lasix 20 mg daily and underwent repeat renal Doppler with history of stenosis that was unchanged.  She also wore a ZIO monitor to evaluate palpitations that showed 26 beats of  SVT with longest lasting 20 beats.  She had carvedilol discontinued and was started on Toprol XL 50 mg daily.  She completed updated renal Doppler that showed no change from 2022 procedure.  She also underwent an updated 2D echo that showed normal EF with mildly enlarged RV and mildly elevated PASP and moderate MVR with elevated BP during echo.  Jamie [name] presents today for 6-week follow-up of palpitations and blood pressure. She reports experiencing palpitations now three to four times a week, lasting for hours, with symptoms including a racing heart, feeling her heart in her throat, and difficulty breathing. These episodes have worsened from previous occurrences that lasted only a few minutes. Her heart rate remains elevated even at rest. Her blood pressure has been as high as 190/110 mmHg. She is on hydralazine 100 mg daily and losartan but has not been taking hydralazine as prescribed. She also takes meloxicam, which may contribute to elevated blood pressure. She experiences lightheadedness, dizziness, hot flashes, and sweats during episodes. She reports increased fatigue and fluid retention, with a two-pound weight gain since her last visit. She sleeps in a chair due to shortness of breath when lying flat. She manages her medications at home using a system similar to a pill organizer. She avoids  caffeine and alcohol, drinks water regularly, and is mindful of hidden salts in foods.   Review of Systems  Please see the history of present illness.    All other systems reviewed and are otherwise negative except as noted above.  Physical Exam    Wt Readings from Last 3 Encounters:  03/21/23 282 lb 6.4 oz (128.1 kg)  12/19/22 280 lb (127 kg)  12/03/22 277 lb (125.6 kg)   VS: Vitals:   03/21/23 0813  BP: (!) 142/70  Pulse: 63  SpO2: 98%  ,Body mass index is 39.95 kg/m. GEN: Well nourished, well developed in no acute distress Neck: No JVD; No carotid bruits Pulmonary: Clear to auscultation  without rales, wheezing or rhonchi  Cardiovascular: Normal rate. Regular rhythm. Normal S1. Normal S2.   Murmurs: There is no murmur.  ABDOMEN: Soft, non-tender, non-distended EXTREMITIES: Bilateral 2+ lower extremity edema  EKG/LABS/ Recent Cardiac Studies   ECG personally reviewed by me today -none completed today  Risk Assessment/Calculations:          Lab Results  Component Value Date   WBC 5.1 11/21/2022   HGB 13.5 11/21/2022   HCT 41.1 11/21/2022   MCV 91.5 11/21/2022   PLT 238 11/21/2022   Lab Results  Component Value Date   CREATININE 0.91 12/24/2022   BUN 17 12/24/2022   NA 144 12/24/2022   K 3.9 12/24/2022   CL 105 12/24/2022   CO2 24 12/24/2022   Lab Results  Component Value Date   CHOL 119 12/24/2022   HDL 52 12/24/2022   LDLCALC 51 12/24/2022   TRIG 84 12/24/2022   CHOLHDL 2.3 12/24/2022    Lab Results  Component Value Date   HGBA1C 5.7 (A) 12/07/2021   Assessment & Plan    1.HFpEF:  -2D echo completed showing EF of 65-70% and grade 2 DD with moderate concentric LVH and mildly elevated PASP with mildly dilated LA and mild to moderate MVR. -NYHA class II/III symptoms with bilateral lower extremity present -Patient continues to experience lower extremity edema and orthopnea with shortness of breath with exertion. - Increase Lasix to 20 mg twice daily for one week to manage fluid retention. -Patient will take potassium 20 mEq for 1 week - Monitor weight and fluid response. -Continue losartan 100 mg daily and we will increase Toprol-XL to 100 mg daily -Low sodium diet, fluid restriction <2L, and daily weights encouraged. Educated to contact our office for weight gain of 2 lbs overnight or 5 lbs in one week.   2.  PSVT: -Event monitor completed showing 26 runs of SVT with longest being 20 beats -Increased frequency and duration of SVT episodes. Current metoprolol dose insufficient. Potential need for electrophysiologist referral if uncontrolled. -  Increase metoprolol (Toprol XL) to 100 mg daily. Double current 50 mg tablets until new prescription is filled. - Monitor for side effects such as drowsiness and fatigue. Contact office if these occur. - Avoid triggers such as caffeine, alcohol, and stress. - Refer to electrophysiologist if SVT remains uncontrolled.  3.  Essential hypertension: -Poorly controlled blood pressure. Non-adherence to hydralazine and multifactorial causes including SVT and dietary factors. - Ensure adherence to hydralazine three times daily. - Continue losartan as previously prescribed. - Monitor blood pressure at home for two weeks and report readings. - Consider referral to hypertension clinic if uncontrolled. - Educate on DASH diet to reduce sodium intake. - Increase Lasix to 20 mg twice daily for one week, then return to once daily. - Check  blood pressure two hours after medication and in the evening.  4.  Hyperlipidemia: -Patient's last LDL cholesterol was 51 -Continue Lipitor 80 mg daily  5. Back Pain Chronic back pain affects sleep. Meloxicam may elevate blood pressure. - Consider reducing meloxicam use and increasing Tylenol for pain management. - Discuss potential referral to pain management for further evaluation and possible injections.    Disposition: Follow-up with Armanda Magic, MD or APP in 1 months    Signed, Napoleon Form, Leodis Rains, NP 03/21/2023, 8:30 AM [facility]

## 2023-03-21 ENCOUNTER — Encounter: Payer: Self-pay | Admitting: Nurse Practitioner

## 2023-03-21 ENCOUNTER — Ambulatory Visit: Payer: 59 | Attending: Nurse Practitioner | Admitting: Nurse Practitioner

## 2023-03-21 ENCOUNTER — Other Ambulatory Visit: Payer: Self-pay

## 2023-03-21 VITALS — BP 142/70 | HR 63 | Ht 70.5 in | Wt 282.4 lb

## 2023-03-21 DIAGNOSIS — E7800 Pure hypercholesterolemia, unspecified: Secondary | ICD-10-CM

## 2023-03-21 DIAGNOSIS — I10 Essential (primary) hypertension: Secondary | ICD-10-CM | POA: Diagnosis not present

## 2023-03-21 DIAGNOSIS — I4719 Other supraventricular tachycardia: Secondary | ICD-10-CM

## 2023-03-21 DIAGNOSIS — R002 Palpitations: Secondary | ICD-10-CM | POA: Diagnosis not present

## 2023-03-21 DIAGNOSIS — I5032 Chronic diastolic (congestive) heart failure: Secondary | ICD-10-CM | POA: Diagnosis not present

## 2023-03-21 DIAGNOSIS — I341 Nonrheumatic mitral (valve) prolapse: Secondary | ICD-10-CM

## 2023-03-21 LAB — BASIC METABOLIC PANEL
BUN/Creatinine Ratio: 12 (ref 12–28)
BUN: 11 mg/dL (ref 8–27)
CO2: 24 mmol/L (ref 20–29)
Calcium: 9.2 mg/dL (ref 8.7–10.3)
Chloride: 107 mmol/L — ABNORMAL HIGH (ref 96–106)
Creatinine, Ser: 0.9 mg/dL (ref 0.57–1.00)
Glucose: 97 mg/dL (ref 70–99)
Potassium: 3.8 mmol/L (ref 3.5–5.2)
Sodium: 144 mmol/L (ref 134–144)
eGFR: 73 mL/min/{1.73_m2} (ref >59)

## 2023-03-21 LAB — TSH+FREE T4
Free T4: 1.01 ng/dL (ref 0.82–1.77)
TSH: 1.38 u[IU]/mL (ref 0.450–4.500)

## 2023-03-21 MED ORDER — METOPROLOL SUCCINATE ER 100 MG PO TB24: 100.0000 mg | ORAL_TABLET | Freq: Every day | ORAL | 1 refills | Status: DC

## 2023-03-21 MED ORDER — FUROSEMIDE 20 MG PO TABS: 20.0000 mg | ORAL_TABLET | Freq: Every day | ORAL | 0 refills | Status: DC

## 2023-03-21 MED ORDER — FUROSEMIDE 20 MG PO TABS: 20.0000 mg | ORAL_TABLET | Freq: Two times a day (BID) | ORAL | 0 refills | Status: AC

## 2023-03-21 MED ORDER — POTASSIUM CHLORIDE CRYS ER 20 MEQ PO TBCR
20.0000 meq | EXTENDED_RELEASE_TABLET | Freq: Every day | ORAL | 0 refills | Status: DC
Start: 2023-03-21 — End: 2023-04-08

## 2023-03-21 NOTE — Patient Instructions (Addendum)
 Medication Instructions:  INCREASE Toprol XL to 100g Take 1 tablet once a day START Lasix 20mg  Take 1 tablet twice a day for 1 week  START Potassium Take 1 tablet daily for 1 week with Lasix  REMINDER Take Hydralazine 100mg  Take 1 tablet 3 times a day *If you need a refill on your cardiac medications before your next appointment, please call your pharmacy*   Lab Work: TODAY-BMET, TSH & FT4 2 WEEKS BMET If you have labs (blood work) drawn today and your tests are completely normal, you will receive your results only by: MyChart Message (if you have MyChart) OR A paper copy in the mail If you have any lab test that is abnormal or we need to change your treatment, we will call you to review the results.   Testing/Procedures: NONE ORDERED   Follow-Up: At [facility], you and your health needs are our priority.  As part of our continuing mission to provide you with exceptional heart care, we have created designated Provider Care Teams.  These Care Teams include your primary Cardiologist (physician) and Advanced Practice Providers (APPs -  Physician Assistants and Nurse Practitioners) who all work together to provide you with the care you need, when you need it.  We recommend signing up for the patient portal called "MyChart".  Sign up information is provided on this After Visit Summary.  MyChart is used to connect with patients for Virtual Visits (Telemedicine).  Patients are able to view lab/test results, encounter notes, upcoming appointments, etc.  Non-urgent messages can be sent to your provider as well.   To learn more about what you can do with MyChart, go to [email or web address].    Your next appointment:   1 month(s)  Provider:   Armanda Magic, MD  or Robin Searing, NP   Other Instructions Check your blood pressure daily for 2 weeks, then contact the office with your readings.  Contact the office either by phone or MyChart with your readings.  Make sure to check  your blood pressure 2 hours after taking your medications.   AVOID these things for 30 minutes before checking your blood pressure: No Drinking caffeine. No Drinking alcohol. No Eating. No Smoking. No Exercising.  Five minutes before checking your blood pressure: Pee. Sit in a dining chair. Avoid sitting in a soft couch or armchair. Be quiet. Do not talk.

## 2023-04-03 NOTE — Progress Notes (Signed)
 NEUROLOGY FOLLOW UP OFFICE NOTE  Jamie [name] [id number]  Assessment/Plan:   Chronic migraine without aura, with status migrainosus, intractable Obstructive sleep apnea Hypertension       Migraine prevention:  topiramate 50mg  at bedtime Migraine rescue:  Nurtec, Zofran Limit use of pain relievers to no more than 2 days out of week to prevent risk of rebound or medication-overuse headache. Keep headache diary Continue use of CPAP Follow up with PCP regarding blood pressure. Follow up 6 months.     Subjective:  Jamie [name] is a 63 year old female with CHF, aortic valve disease, MVP, HTN, HLD, pulmonary HTN who follows up for migraines.   UPDATE: She has been having some difficulty controlling her blood pressure Migraines overall have been stable. Intensity:  severe Duration:  started to improve in 15-30 minutes with Nurtec samples Frequency:  2 a month  Current NSAIDS/analgesics: Tylenol Current triptans:  none Current ergotamine:  none Current anti-emetic:  Zofran 4mg  Current muscle relaxants:  none Current Antihypertensive medications:  metoprolol tartrate, carvedilol, furosemide, hydralazine,irbesartan-HCTZ, losartan Current Antidepressant medications:  none Current Anticonvulsant medications:  topiramate 50mg  QHS, gabapentin 300-600mg  PRN back pain) Current anti-CGRP:  Nurtec PRN (samples) Current Vitamins/Herbal/Supplements:  Mg, melatonin, elderberry Current Antihistamines/Decongestants:  none Other therapy:  none Hormone/birth control:  none   Caffeine:  1 cup of coffee daily.  Sometimes soda and tea Diet:  Sometimes soda.  3 to 5 bottles water daily.  Skips meals Exercise:  was exercising but had to stop 6 weeks ago due to injury Depression:  no; Anxiety:  no Other pain:  back and knee pain Sleep hygiene:  Has OSA - uses CPAP at least 4 hours a night.   HISTORY:  She started having these intractable headaches in her 10s.  They usually occur once  a year but not necessarily same time of year.  Most recent episode started in September and lasted 2 months.  She describes a daily headache.  A persistent dull headache with severe episodes occurring 15 days out of the month.  It is a holocephalic pressure/pounding (sometimes right sided) with photophobia and osmophobia but no nausea, phonophobia, autonomic symptoms, numbness or weakness.  She sees spots, but she has that all of the time.  She treated with Tylenol, which she has been taking daily for years to treat her arthritis.  Her PCP changed it to meloxicam which she continues to take daily.  No known triggers.  She has these headaches once a year but not necessarily same time of year.  She can't recall if they only occur with change in seasons.     She also has history of migraines since her 70s.  The are severe pounding pain involving frontal region/occipital region and right side of face.  Associated with nausea, photophobia, phonophobia and osmophobia.  They last 2 to 3 days and occur 6 times a year.   Past Imaging: 10/06/2017 CT HEAD WO:  Normal       Past NSAIDS/analgesics:  acetaminophen, ibuprofen, acetaminophen, tramadol Past abortive triptans:  rizatriptan, sumatriptan Past abortive ergotamine:  none Past muscle relaxants:  cyclobenzaprine, tizanidine Past anti-emetic:  none Past antihypertensive medications:  amlodipine Past antidepressant medications:  none Past anticonvulsant medications:  none Past anti-CGRP:  none Past vitamins/Herbal/Supplements:  none Past antihistamines/decongestants:  none Other past therapies:  none     Family history of headache:  Brother.  No family history of aneurysm.   PAST MEDICAL HISTORY: Past Medical History:  Diagnosis  Date   Acquired dilation of ascending aorta and aortic root (HCC)    38mm by 2D echo 03/2019   Allergy    Anxiety    Aortic valve disease    functionally bicuspid AV with fusion of the right and left coronary cusps  with no AS by echo 03/2020   Arthritis    in back   Chest pain    neg cath 2016 after false positive myoview   CHF (congestive heart failure) (HCC)    Depression    Diabetes mellitus without complication (HCC)    type 2, no meds   Dyspnea    with exertion   GERD (gastroesophageal reflux disease)    Hyperglycemia    Hyperlipidemia    Hypertension    Joint pain    Knee pain    Low back pain    Migraine    Mitral regurgitation    mild to moderate by echo 02/2023   Mitral valve prolapse    Muscle cramps    PAT (paroxysmal atrial tachycardia) (HCC)    by Ziopatch 02/2023   Pulmonary HTN (HCC)    mild with PASP by echo 03/2019 but on echo 03/2020   Sleep apnea    uses cpap    MEDICATIONS: Current Outpatient Medications on File Prior to Visit  Medication Sig Dispense Refill   acetaminophen (TYLENOL) 500 MG tablet Take 1,000 mg by mouth every 6 (six) hours as needed for moderate pain.     atorvastatin (LIPITOR) 80 MG tablet TAKE 1 TABLET BY MOUTH EVERY DAY 90 tablet 0   bismuth subsalicylate (PEPTO BISMOL) 262 MG/15ML suspension Take 30 mLs by mouth every 6 (six) hours as needed for diarrhea or loose stools or indigestion.     Camphor-Eucalyptus-Menthol (VICKS VAPORUB EX) Apply 1 Application topically at bedtime as needed (congestion).     Cholecalciferol (VITAMIN D3) 50 MCG (2000 UT) TABS Take by mouth.     Dextromethorphan-GG-APAP (CORICIDIN HBP COLD/COUGH/FLU) 10-200-325 MG/15ML LIQD Take 1 Dose by mouth daily as needed (congestion / cold symptoms).     DM-Doxylamine-Acetaminophen (CORICIDIN HBP NIGHTTIME COLD PO) Take 2 tablets by mouth daily as needed (congestion).     furosemide (LASIX) 20 MG tablet Take 1 tablet (20 mg total) by mouth 2 (two) times daily for 7 days. 15 tablet 0   gabapentin (NEURONTIN) 300 MG capsule Take 300 mg by mouth daily.     glucosamine-chondroitin 500-400 MG tablet Take 1 tablet by mouth 3 (three) times daily.     hydrALAZINE (APRESOLINE)  100 MG tablet TAKE 1 TABLET BY MOUTH THREE TIMES A DAY 270 tablet 0   losartan (COZAAR) 100 MG tablet TAKE 1 TABLET BY MOUTH EVERY DAY 90 tablet 0   meloxicam (MOBIC) 15 MG tablet Take 15 mg by mouth daily.     metoprolol succinate (TOPROL XL) 100 MG 24 hr tablet Take 1 tablet (100 mg total) by mouth daily. Take with or immediately following a meal. 30 tablet 1   Multiple Vitamin (MULTIVITAMIN WITH MINERALS) TABS tablet Take 1 tablet by mouth daily.     pantoprazole (PROTONIX) 40 MG tablet Take 1 tablet (40 mg total) by mouth daily. 90 tablet 3   potassium chloride SA (KLOR-CON M20) 20 MEQ tablet Take 1 tablet (20 mEq total) by mouth daily for 7 days. 10 tablet 0   topiramate (TOPAMAX) 25 MG capsule Take 25 mg by mouth at bedtime.     Current Facility-Administered Medications on File Prior  to Visit  Medication Dose Route Frequency Provider Last Rate Last Admin   0.9 %  sodium chloride infusion  500 mL Intravenous Once Meryl Dare, MD        ALLERGIES: No Known Allergies  FAMILY HISTORY: Family History  Problem Relation Age of Onset   Hyperlipidemia Mother    Hypertension Mother    AAA (abdominal aortic aneurysm) Mother    Stroke Father    Hypertension Father    Hyperlipidemia Brother        x2   Hypertension Brother        x2   Liver cancer Maternal Aunt    Breast cancer Cousin    Colon cancer Cousin    Colon polyps Neg Hx    Esophageal cancer Neg Hx    Stomach cancer Neg Hx    Rectal cancer Neg Hx       Objective:  Blood pressure (!) 161/91, pulse 65, height 5\' 10"  (1.778 m), weight 208 lb (94.3 kg), SpO2 99%. General: No acute distress.  Patient appears well-groomed.       Shon Millet, DO  CC: Teodoro Kil, MD

## 2023-04-08 ENCOUNTER — Encounter: Payer: Self-pay | Admitting: Neurology

## 2023-04-08 ENCOUNTER — Other Ambulatory Visit: Payer: Self-pay | Admitting: Internal Medicine

## 2023-04-08 ENCOUNTER — Ambulatory Visit (INDEPENDENT_AMBULATORY_CARE_PROVIDER_SITE_OTHER): Payer: 59 | Admitting: Neurology

## 2023-04-08 ENCOUNTER — Other Ambulatory Visit: Payer: Self-pay | Admitting: Neurology

## 2023-04-08 ENCOUNTER — Telehealth: Payer: Self-pay | Admitting: Pharmacy Technician

## 2023-04-08 ENCOUNTER — Other Ambulatory Visit (HOSPITAL_COMMUNITY): Payer: Self-pay

## 2023-04-08 VITALS — BP 161/91 | HR 65 | Ht 70.0 in | Wt 208.0 lb

## 2023-04-08 DIAGNOSIS — Z0000 Encounter for general adult medical examination without abnormal findings: Secondary | ICD-10-CM

## 2023-04-08 DIAGNOSIS — G4733 Obstructive sleep apnea (adult) (pediatric): Secondary | ICD-10-CM | POA: Diagnosis not present

## 2023-04-08 DIAGNOSIS — G43009 Migraine without aura, not intractable, without status migrainosus: Secondary | ICD-10-CM

## 2023-04-08 MED ORDER — NURTEC 75 MG PO TBDP: 1.0000 | ORAL_TABLET | Freq: Every day | ORAL | 5 refills | Status: DC | PRN

## 2023-04-08 NOTE — Patient Instructions (Signed)
 Continue topiramate 50mg  at bedtime  Take Nurtec at earliest onset of migraine.  Maximum 1 tablet in 24 hours.

## 2023-04-08 NOTE — Telephone Encounter (Signed)
 PA has been submitted, and telephone encounter has been created. Please see telephone encounter dated 4.7.25.

## 2023-04-08 NOTE — Telephone Encounter (Signed)
 Pharmacy Patient Advocate Encounter   Received notification from RX Request Messages that prior authorization for NURTEC 75MG  is required/requested.   Insurance verification completed.   The patient is insured through [facility] .   Per test claim: PA required; PA submitted to above mentioned insurance via CoverMyMeds Key/confirmation #/EOC [id number] Status is pending

## 2023-04-11 ENCOUNTER — Ambulatory Visit
Admission: RE | Admit: 2023-04-11 | Discharge: 2023-04-11 | Disposition: A | Discharge status: Home / Self Care | Source: Ambulatory Visit | Attending: Internal Medicine | Admitting: Internal Medicine

## 2023-04-11 DIAGNOSIS — Z0000 Encounter for general adult medical examination without abnormal findings: Secondary | ICD-10-CM

## 2023-04-15 ENCOUNTER — Other Ambulatory Visit: Payer: Self-pay | Admitting: Nurse Practitioner

## 2023-04-17 ENCOUNTER — Other Ambulatory Visit (HOSPITAL_COMMUNITY): Payer: Self-pay

## 2023-04-17 NOTE — Telephone Encounter (Signed)
 Pharmacy Patient Advocate Encounter  Received notification from [employer] that Prior Authorization for NURTEC 75MG  has been APPROVED from 4.7.25 to 4.7.26. Ran test claim, Copay is $0. This test claim was processed through [facility]- copay amounts may vary at other pharmacies due to pharmacy/plan contracts, or as the patient moves through the different stages of their insurance plan.   PA #/Case ID/Reference #: [id number]

## 2023-04-23 NOTE — Progress Notes (Unsigned)
 The patient attended a screening event on 03/18/2023, where her blood pressure was measured at 190/103 mmHg. During the event, the patient reported that she does not smoke, has Private/VA insurance, is established with a primary care provider(Dr. Ival Marines?), and she declined completing the SDOH screener.  A chart review confirmed that the patient does have an active PCP. Dr. Raeanne Bull with [facility] [place]. Chart review further indicates that the pt has [employer] as her insurance. Chart review indicates that the patient's most recent office visit was on 01/18/2022. The pt has been in communication with her PCP since then. She does have an upcoming appt with Rejeana Card, NP at [facility] - Cardiologist appt on 04/30/2023 at 2:45 PM but no CH[facility] visible upcoming appts with her PCP. Her last visit with this provider was on 03/21/2023, the provider is addressing the pt's bp.  CHW called pt's PCP office to obtain any upcoming appt. The pt is still being seen by this provider. Her last appt was actually 08/08/2022 which was a telehealth visit and she does not have any upcoming appts for 2025. CHW called pt at her home # to obtain PCP status and to share screening results. CHW was unable to leave vm because of "acces code". CHW called pt's mobile #. CHW asked pt if she has any upcoming appts with her PCP. Pt shared she is currently on different medications for her bp and being monitored by her Cardiologist for it. Pt further shared that she has an upcoming appt this month. She shared that she has not seen her PCP and does not have any upcoming appts but is aware that it is time to set one up. CHW asked pt if she wishes to be connected to PCP to schedule appt. Pt declined to schedule appt with PCP at this time. Pt will reach out to schedule appt with her PCP.   04/24/23: CHW added f/u type in 5 in 5 f/u documentation section  At this time, no  additional support from the Health Equity Team is indicated.

## 2023-04-30 ENCOUNTER — Ambulatory Visit: Admitting: Nurse Practitioner

## 2023-05-21 NOTE — Progress Notes (Signed)
Declined SDOH.

## 2023-06-18 ENCOUNTER — Other Ambulatory Visit: Payer: Self-pay | Admitting: Internal Medicine

## 2023-06-18 DIAGNOSIS — I10 Essential (primary) hypertension: Secondary | ICD-10-CM

## 2023-06-18 DIAGNOSIS — I5032 Chronic diastolic (congestive) heart failure: Secondary | ICD-10-CM

## 2023-06-27 NOTE — Progress Notes (Signed)
 Pt has been told to contact PCP in regards to elevated BP readings..states that she is not compliant with taking her BP meds

## 2023-07-08 NOTE — Progress Notes (Deleted)
 Cardiology Office Note    Patient Name: Jamie [name] Date of Encounter: 07/08/2023  Primary Care Provider:  Theophilus [name], Tully GRADE, MD Primary Cardiologist:  Jamie Bihari, MD Primary Electrophysiologist: None   Past Medical History    Past Medical History:  Diagnosis Date   Acquired dilation of ascending aorta and aortic root (HCC)    38mm by 2D echo 03/2019   Allergy    Anxiety    Aortic valve disease    functionally bicuspid AV with fusion of the right and left coronary cusps with no AS by echo 03/2020   Arthritis    in back   Chest pain    neg cath 2016 after false positive myoview    CHF (congestive heart failure) (HCC)    Depression    Diabetes mellitus without complication (HCC)    type 2, no meds   Dyspnea    with exertion   GERD (gastroesophageal reflux disease)    Hyperglycemia    Hyperlipidemia    Hypertension    Joint pain    Knee pain    Low back pain    Migraine    Mitral regurgitation    mild to moderate by echo 02/2023   Mitral valve prolapse    Muscle cramps    PAT (paroxysmal atrial tachycardia) (HCC)    by Ziopatch 02/2023   Pulmonary HTN (HCC)    mild with PASP by echo 03/2019 but on echo 03/2020   Sleep apnea    uses cpap    History of Present Illness  Jamie [name] is a 63 y.o. 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  Jamie [name] was last seen on 03/21/2023 for 7-month follow-up.  During visit patient was experiencing lower extremity swelling and shortness of breath with exertion.  She had Lasix  adjusted for diuresis.  She also reported palpitations Toprol -XL was increased to 100 mg double her previous dose of 50 mg.  We also discussed referring to EP for evaluation if palpitations remain uncontrolled.  She was also found to have elevated  BP due to taking hydralazine  incorrectly.  She was advised to monitor BP for 2 weeks at home with plan to refer to hypertension clinic if uncontrolled.    Patient denies chest pain, palpitations, dyspnea, PND, orthopnea, nausea, vomiting, dizziness, syncope, edema, weight gain, or early satiety.   Discussed the use of AI scribe software for clinical note transcription with the patient, who gave verbal consent to proceed.  History of Present Illness    ***Notes:   Review of Systems  Please see the history of present illness.    All other systems reviewed and are otherwise negative except as noted above.  Physical Exam    Wt Readings from Last 3 Encounters:  04/08/23 208 lb (94.3 kg)  03/21/23 282 lb 6.4 oz (128.1 kg)  12/19/22 280 lb (127 kg)   CD:Uyzmz were no vitals filed for this visit.,There is no height or weight on file to calculate BMI. GEN: Well nourished, well developed in no acute distress Neck: No JVD; No carotid bruits Pulmonary: Clear to auscultation without rales, wheezing or rhonchi  Cardiovascular: Normal rate. Regular rhythm. Normal S1. Normal S2.   Murmurs: There is no murmur.  ABDOMEN: Soft, non-tender, non-distended EXTREMITIES:  No edema; No deformity   EKG/LABS/ Recent  Cardiac Studies   ECG personally reviewed by me today - ***  Risk Assessment/Calculations:   {Does this patient have ATRIAL FIBRILLATION?:[phone]}      Lab Results  Component Value Date   WBC 5.1 11/21/2022   HGB 13.5 11/21/2022   HCT 41.1 11/21/2022   MCV 91.5 11/21/2022   PLT 238 11/21/2022   Lab Results  Component Value Date   CREATININE 0.90 03/21/2023   BUN 11 03/21/2023   NA 144 03/21/2023   K 3.8 03/21/2023   CL 107 (H) 03/21/2023   CO2 24 03/21/2023   Lab Results  Component Value Date   CHOL 119 12/24/2022   HDL 52 12/24/2022   LDLCALC 51 12/24/2022   TRIG 84 12/24/2022   CHOLHDL 2.3 12/24/2022    Lab Results  Component Value Date   HGBA1C 5.7 (A)  12/07/2021   Assessment & Plan    Assessment and Plan Assessment & Plan     1.  Essential hypertension: - Patient's blood pressure today was***  2.HFpEF:  -2D echo completed showing EF of 65-70% and grade 2 DD with moderate concentric LVH and mildly elevated PASP with mildly dilated LA and mild to moderate MVR. -NYHA class II/III symptoms***  3. PSVT: - Previous event monitor completed showing 26 runs of SVT with longest being 20 beats  4.  Hyperlipidemia:  5.  Pulmonary HTN: - Continue Lasix ***      Disposition: Follow-up with Jamie Bihari, MD or APP in *** months {Are you ordering a CV Procedure (e.g. stress test, cath, DCCV, TEE, etc)?   Press F2        :[id number]}   Signed, Wyn Raddle, Jackee Shove, NP 07/08/2023, 8:38 AM [facility]

## 2023-07-09 ENCOUNTER — Ambulatory Visit: Admitting: Nurse Practitioner

## 2023-07-21 ENCOUNTER — Other Ambulatory Visit: Payer: Self-pay | Admitting: Internal Medicine

## 2023-07-21 DIAGNOSIS — E785 Hyperlipidemia, unspecified: Secondary | ICD-10-CM

## 2023-08-02 ENCOUNTER — Ambulatory Visit: Payer: Self-pay | Admitting: *Deleted

## 2023-08-02 NOTE — Telephone Encounter (Signed)
                    FYI Only or Action Required?: Action required by provider: request for appointment.  Patient was last seen in primary care on 08/08/2022 by Micheal Wolm ORN, MD.  Called Nurse Triage reporting Leg Swelling.  Symptoms began several weeks ago.  Interventions attempted: Nothing.  Symptoms are: gradually worsening.  Triage Disposition: See Physician Within 24 Hours  Patient/caregiver understands and will follow disposition?: Unsure           Copied from CRM #[id number]. Topic: Clinical - Red Word Triage >> Aug 02, 2023  8:07 AM Jamie [name] wrote: Red Word that prompted transfer to Nurse Triage: Patient states she has swelling in feet and legs for about 2.5 weeks. She is still walking but they are painful. Full of fluid. Reason for Disposition  [1] MODERATE leg swelling (e.g., swelling extends up to knees) AND [2] new-onset or getting worse  Answer Assessment - Initial Assessment Questions Offered appt today within 4 hours. Patient requesting to talk with manager at work to check on appt. Recommended to call cardiologist as well.       1. ONSET: When did the swelling start? (e.g., minutes, hours, days)     2 1/2 weeks 2. LOCATION: What part of the leg is swollen?  Are both legs swollen or just one leg?     Up to knees 3. SEVERITY: How bad is the swelling? (e.g., localized; mild, moderate, severe)     Up to knees moderate both legs 4. REDNESS: Is there redness or signs of infection?     Yes and itching  5. PAIN: Is the swelling painful to touch? If Yes, ask: How painful is it?   (Scale 1-10; mild, moderate or severe)     7/10 6. FEVER: Do you have a fever? If Yes, ask: What is it, how was it measured, and when did it start?      na 7. CAUSE: What do you think is causing the leg swelling?     Na 8. MEDICAL HISTORY: Do you have a history of blood clots (e.g., DVT), cancer, heart failure, kidney disease, or liver  failure?     Hx heart issues 9. RECURRENT SYMPTOM: Have you had leg swelling before? If Yes, ask: When was the last time? What happened that time?     Yes  10. OTHER SYMPTOMS: Do you have any other symptoms? (e.g., chest pain, difficulty breathing)       Feet / legs burning ,itching, redness, no weeping. Feels heart flutters more often  11. PREGNANCY: Is there any chance you are pregnant? When was your last menstrual period?       na  Protocols used: Leg Swelling and Edema-A-AH

## 2023-08-02 NOTE — Telephone Encounter (Signed)
 Spoke with the patient and informed her our office does not have any openings today and she should seek treatment at an urgent care.  Patient declined, stated she made an appt on Monday and will be fine until then and does not want to sit up there all day.

## 2023-08-05 ENCOUNTER — Encounter: Payer: Self-pay | Admitting: Family Medicine

## 2023-08-05 ENCOUNTER — Ambulatory Visit (INDEPENDENT_AMBULATORY_CARE_PROVIDER_SITE_OTHER): Admitting: Family Medicine

## 2023-08-05 ENCOUNTER — Ambulatory Visit: Payer: Self-pay | Admitting: Family Medicine

## 2023-08-05 VITALS — BP 168/90 | HR 63 | Temp 98.3°F | Wt 283.2 lb

## 2023-08-05 DIAGNOSIS — I10 Essential (primary) hypertension: Secondary | ICD-10-CM

## 2023-08-05 DIAGNOSIS — R600 Localized edema: Secondary | ICD-10-CM

## 2023-08-05 LAB — BRAIN NATRIURETIC PEPTIDE: Pro B Natriuretic peptide (BNP): 64 pg/mL (ref 0.0–100.0)

## 2023-08-05 LAB — COMPREHENSIVE METABOLIC PANEL WITH GFR
ALT: 20 U/L (ref 0–35)
AST: 15 U/L (ref 0–37)
Albumin: 4.3 g/dL (ref 3.5–5.2)
Alkaline Phosphatase: 63 U/L (ref 39–117)
BUN: 10 mg/dL (ref 6–23)
CO2: 28 meq/L (ref 19–32)
Calcium: 9.1 mg/dL (ref 8.4–10.5)
Chloride: 108 meq/L (ref 96–112)
Creatinine, Ser: 0.77 mg/dL (ref 0.40–1.20)
GFR: 83.16 mL/min (ref >60.00)
Glucose, Bld: 104 mg/dL — ABNORMAL HIGH (ref 70–99)
Potassium: 3.4 meq/L — ABNORMAL LOW (ref 3.5–5.1)
Sodium: 143 meq/L (ref 135–145)
Total Bilirubin: 0.6 mg/dL (ref 0.2–1.2)
Total Protein: 7.1 g/dL (ref 6.0–8.3)

## 2023-08-05 LAB — MICROALBUMIN / CREATININE URINE RATIO
Creatinine,U: 122.3 mg/dL
Microalb Creat Ratio: 26.8 mg/g (ref 0.0–30.0)
Microalb, Ur: 3.3 mg/dL — ABNORMAL HIGH (ref 0.0–1.9)

## 2023-08-05 NOTE — Progress Notes (Signed)
 Established Patient Office Visit  Subjective   Patient ID: Jamie [name], female    DOB: Jun 13, 1960  Age: 63 y.o. MRN: [id number]  Chief Complaint  Patient presents with   Edema    HPI   Jamie [name] is here with bilateral leg edema.  She states she had similar edema in the past but worsened over the past few weeks.  No recent changes other than some recent travel to [place] and perhaps eating out a little more than usual.  She had echo last February which showed EF of 65 to 70%.  Does have history of LVH and grade 2 diastolic dysfunction.  Mild to moderate mitral regurgitation.  No orthopnea.  Right lower extremity pain slightly greater than the left.  She takes Lasix  20 mg daily at baseline.  No significant increased dyspnea with day-to-day activities.  Does have obstructive sleep apnea and uses CPAP nightly.  Medications reviewed.  She does take gabapentin  300 mg nightly for restless legs and also meloxicam  daily both of which could be contributing some to her edema.  Recent TSH normal.  Edema may be somewhat worse late day but does not go down completely at night.  No recent chest pains.  Past Medical History:  Diagnosis Date   Acquired dilation of ascending aorta and aortic root (HCC)    38mm by 2D echo 03/2019   Allergy    Anxiety    Aortic valve disease    functionally bicuspid AV with fusion of the right and left coronary cusps with no AS by echo 03/2020   Arthritis    in back   Chest pain    neg cath 2016 after false positive myoview    CHF (congestive heart failure) (HCC)    Depression    Diabetes mellitus without complication (HCC)    type 2, no meds   Dyspnea    with exertion   GERD (gastroesophageal reflux disease)    Hyperglycemia    Hyperlipidemia    Hypertension    Joint pain    Knee pain    Low back pain    Migraine    Mitral regurgitation    mild to moderate by echo 02/2023   Mitral valve prolapse    Muscle cramps    PAT (paroxysmal atrial  tachycardia) (HCC)    by Ziopatch 02/2023   Pulmonary HTN (HCC)    mild with PASP by echo 03/2019 but on echo 03/2020   Sleep apnea    uses cpap   Past Surgical History:  Procedure Laterality Date   BACK SURGERY     BREAST BIOPSY     BREAST REDUCTION SURGERY Bilateral 01/04/2022   Procedure: BILATERAL BREAST REDUCTION;  Surgeon: Lowery Estefana RAMAN, DO;  Location: [facility] OR;  Service: Plastics;  Laterality: Bilateral;   CESAREAN SECTION     x 1   COLONOSCOPY     LEFT HEART CATHETERIZATION WITH CORONARY ANGIOGRAM N/A 07/08/2013   Procedure: LEFT HEART CATHETERIZATION WITH CORONARY ANGIOGRAM;  Surgeon: Maude JAYSON Emmer, MD;  Location: [facility] CATH LAB;  Service: Cardiovascular;  Laterality: N/A;   TUBAL LIGATION      reports that she quit smoking about 27 years ago. Her smoking use included cigarettes. She has been exposed to tobacco smoke. She has never used smokeless tobacco. She reports current alcohol use. She reports that she does not use drugs. family history includes AAA (abdominal aortic aneurysm) in her mother; Breast cancer in her cousin; Colon cancer in  her cousin; Hyperlipidemia in her brother and mother; Hypertension in her brother, father, and mother; Liver cancer in her maternal aunt; Stroke in her father. No Known Allergies  Review of Systems  Constitutional:  Negative for weight loss.  Respiratory:  Negative for cough, sputum production, shortness of breath and wheezing.   Cardiovascular:  Positive for leg swelling. Negative for chest pain and PND.  Neurological:  Negative for dizziness.      Objective:     BP (!) 168/90 (BP Location: Left Arm, Cuff Size: Large)   Pulse 63   Temp 98.3 F (36.8 C) (Oral)   Wt 283 lb 3.2 oz (128.5 kg)   LMP  (LMP Unknown)   SpO2 95%   BMI 40.63 kg/m  BP Readings from Last 3 Encounters:  08/05/23 (!) 168/90  06/17/23 (!) 173/87  05/20/23 (!) 181/88   Wt Readings from Last 3 Encounters:  08/05/23 283 lb 3.2 oz (128.5 kg)   04/08/23 208 lb (94.3 kg)  03/21/23 282 lb 6.4 oz (128.1 kg)      Physical Exam Vitals reviewed.  Constitutional:      General: She is not in acute distress.    Appearance: She is not ill-appearing.  Cardiovascular:     Rate and Rhythm: Normal rate and regular rhythm.  Pulmonary:     Effort: Pulmonary effort is normal.     Breath sounds: Normal breath sounds. No wheezing or rales.  Musculoskeletal:     Comments: She has some increased edema especially involving feet ankles lower legs bilaterally.  Nonpitting  Neurological:     Mental Status: She is alert.      No results found for any visits on 08/05/23.    The ASCVD Risk score (Arnett DK, et al., 2019) failed to calculate for the following reasons:   The valid total cholesterol range is 130 to 320 mg/dL    Assessment & Plan:   #1 bilateral leg edema.  Somewhat chronic but worsened over the past few weeks.  Probably multifactorial.  She has history of known grade 2 diastolic dysfunction.  Also takes gabapentin  and meloxicam  regularly.  Also has obstructive sleep apnea but on CPAP.  -Try to keep daily sodium intake less than 2500 mg - Continue regular use of CPAP - Elevate legs frequently - Try to use knee-high compression stockings is much as possible - Try to leave off meloxicam  as much as possible - Increase furosemide  20 mg to 2 daily for the next week and then if edema improved try dropping back to 1 daily - Recheck labs today with CMP, BNP, urine microalbumin screen.  She had recent TSH which was normal.  #2 hypertension poorly controlled with today's reading.  She states this is somewhat atypical for her though she has been high for quite some time.  Reiterated the importance of low-sodium.  Continue current medications.  Set up primary follow-up in 1 month if still up at that point consider possible additional medication such as Aldactone especially if she is still having increased edema   No follow-ups on file.     Wolm Scarlet, MD

## 2023-08-05 NOTE — Patient Instructions (Signed)
 Try to keep daily sodium < 2,500 mg  Elevate legs as much as possible  May increase the Furosemide  to two daily for the next week and then drop back to one daily  Try to leave off the Meloxicam  as much as possible  Gabapentin  can also cause swelling  Follow up with Dr Theophilus soon.   May need additional BP medications such as Aldactone.

## 2023-08-06 MED ORDER — POTASSIUM CHLORIDE ER 10 MEQ PO TBCR: 10.0000 meq | EXTENDED_RELEASE_TABLET | Freq: Every day | ORAL | 0 refills | Status: DC

## 2023-08-16 ENCOUNTER — Other Ambulatory Visit: Payer: Self-pay | Admitting: Internal Medicine

## 2023-08-16 DIAGNOSIS — E785 Hyperlipidemia, unspecified: Secondary | ICD-10-CM

## 2023-08-16 NOTE — Progress Notes (Signed)
 The patient attended a screening event on 05/20/23 where her BP screening results was 181/88. At the event the patient noted she doesn't smoke, has insurance, and has a pcp. Patient declined SDOH questionnaire. Pt reports that she is non compliant with medication at the event.Pt was recommended to follow up with pcp about BP.  Per chart review pt does have a pcp and insurance. Chart review does not indicate any future appts. Pt last office visit was on 08/05/23 referred by her pcp. At that appt the pt was given advice and instructions as follows Try to keep daily sodium intake less than 2500 mg and hypertension poorly controlled with today's reading.  She states this is somewhat atypical for her though she has been high for quite some time.  Reiterated the importance of low-sodium.  Continue current medications.  Set up primary follow-up in 1 month if still up at that point consider possible additional medication such as Aldactone especially if she is still having increased edema   [facility] has sent curtesy BP resources. No additional Health equity team support indicated at this time.

## 2023-08-19 NOTE — Progress Notes (Signed)
 Last OV 8/4..elevated BP discussed

## 2023-09-14 ENCOUNTER — Other Ambulatory Visit: Payer: Self-pay | Admitting: Internal Medicine

## 2023-09-14 DIAGNOSIS — E785 Hyperlipidemia, unspecified: Secondary | ICD-10-CM

## 2023-10-14 NOTE — Progress Notes (Signed)
 The patient attended a screening event on 08/19/2023 where her BP screening results was 159/89. At the event the patient noted she has [employer] and does not smoke. Patient did not have any SDOH insecurities. Pt listed pcp as Dr. Tully Delma Nap, MD.At the screening event the clinician documented last office visit elevated BP was discussed by pt care team.   Per chart review pt does have a pcp and insurance. Chart review does not indicate any future appts. Pt last office visit was on 08/05/23 with a care team of the pcp. On 08/05/2023 pt BP was 168/90. At that appt the pt was given advice and instructions as follows Try to keep daily sodium intake less than 2500 mg and hypertension poorly controlled with today's reading.  She states this is somewhat atypical for her though she has been high for quite some time.  Reiterated the importance of low-sodium.  Continue current medications.  Set up primary follow-up in 1 month if still up at that point consider possible additional medication such as Aldactone especially if she is still having increased edema  No additional Health equity team support indicated at this time.

## 2023-11-04 ENCOUNTER — Other Ambulatory Visit: Payer: Self-pay | Admitting: Family Medicine

## 2023-12-12 ENCOUNTER — Ambulatory Visit: Payer: Self-pay

## 2023-12-12 NOTE — Telephone Encounter (Signed)
 FYI Only or Action Required?: Action required by provider: request for appointment.  Patient was last seen in primary care on 08/05/2023 by Micheal Wolm ORN, MD.  Called Nurse Triage reporting Leg Swelling.  Symptoms began about a month ago.  Interventions attempted: Rest, hydration, or home remedies.  Symptoms are: gradually improving.  Triage Disposition: See Physician Within 24 Hours  Patient/caregiver understands and will follow disposition?: No, wishes to speak with PCP  Copied from CRM #[id number]. Topic: Clinical - Red Word Triage >> Dec 12, 2023  4:36 PM Joesph NOVAK wrote: Red Word that prompted transfer to Nurse Triage: legs and feet are swelling Reason for Disposition  [1] MODERATE leg swelling (e.g., swelling extends up to knees) AND [2] new-onset or getting worse  Answer Assessment - Initial Assessment Questions Feet are turning black for months, toes turning colors. No appointments available with Dr Theophilus until Monday 12/15. Patient does not want to see anyone else at practice. Does not want togo to UC. Wants to be scheduled with Dr Theophilus. Unable to given dispo.  1. ONSET: When did the swelling start? (e.g., minutes, hours, days)     Constant swelling. 2. LOCATION: What part of the leg is swollen?  Are both legs swollen or just one leg?     Right leg worse than left, both legs up to knees. 3. SEVERITY: How bad is the swelling? (e.g., localized; mild, moderate, severe)     Moderate has come down a bit 5. PAIN: Is the swelling painful to touch? If Yes, ask: How painful is it?   (Scale 1-10; mild, moderate or severe)     Always pain she says. Mild pain 6. FEVER: Do you have a fever? If Yes, ask: What is it, how was it measured, and when did it start?      denies 7. CAUSE: What do you think is causing the leg swelling?     CHF 8. MEDICAL HISTORY: Do you have a history of blood clots (e.g., DVT), cancer, heart failure, kidney disease, or liver  failure?     CHF, recent 12 hour car ride  9. RECURRENT SYMPTOM: Have you had leg swelling before? If Yes, ask: When was the last time? What happened that time?     Yes constantly has due to CHF 10. OTHER SYMPTOMS: Do you have any other symptoms? (e.g., chest pain, difficulty breathing)       Getting some SOB with exertion. Not a new symptom but is worsening a little. Denies difficulty breathing, chest pain.  Protocols used: Leg Swelling and Edema-A-AH

## 2023-12-12 NOTE — Telephone Encounter (Signed)
 Appointment scheduled for 12/16/23.

## 2023-12-12 NOTE — Progress Notes (Signed)
 Pt attended 10/24/2023 screening event with BP of 159/76 with a elevated blood sugar & A1C, pt is diabetic. Pt noted at event that he does have a PCP. At event pt did not indicate any SDOH needs. Pt also noted that she is a former smoker and listed Private as her insurance at the event.   Per initial f/u pt's PCP was sent courtesy in-basket message.  Per chart review pt does have a PCP (Jamie [name]; [facility] [place]) , insurance, and is not a smoker. Pt is currently being referred to and seen by provider at [facility]. Pt was seen by different provider in PCP office on 08/05/2023. Pt does not indicate any SDOH needs at this time.  No additional pt f/u to be scheduled at this time per health equity protocol.

## 2023-12-16 ENCOUNTER — Encounter: Payer: Self-pay | Admitting: Internal Medicine

## 2023-12-16 ENCOUNTER — Ambulatory Visit: Admitting: Internal Medicine

## 2023-12-16 VITALS — BP 172/96 | HR 62 | Temp 98.0°F | Wt 282.0 lb

## 2023-12-16 DIAGNOSIS — E118 Type 2 diabetes mellitus with unspecified complications: Secondary | ICD-10-CM

## 2023-12-16 DIAGNOSIS — G4733 Obstructive sleep apnea (adult) (pediatric): Secondary | ICD-10-CM

## 2023-12-16 DIAGNOSIS — E782 Mixed hyperlipidemia: Secondary | ICD-10-CM

## 2023-12-16 DIAGNOSIS — I10 Essential (primary) hypertension: Secondary | ICD-10-CM

## 2023-12-16 DIAGNOSIS — I5032 Chronic diastolic (congestive) heart failure: Secondary | ICD-10-CM

## 2023-12-16 LAB — POCT GLYCOSYLATED HEMOGLOBIN (HGB A1C): Hemoglobin A1C: 6.1 % — AB (ref 4.0–5.6)

## 2023-12-16 MED ORDER — VALSARTAN-HYDROCHLOROTHIAZIDE 160-25 MG PO TABS: 1.0000 | ORAL_TABLET | Freq: Every day | ORAL | 1 refills | Status: DC

## 2023-12-16 MED ORDER — MOUNJARO 2.5 MG/0.5ML ~~LOC~~ SOAJ: 2.5000 mg | SUBCUTANEOUS | 0 refills | Status: DC

## 2023-12-16 NOTE — Progress Notes (Signed)
 Established Patient Office Visit     CC/Reason for Visit: Follow-up chronic medical conditions  HPI: Jamie [name] is a 63 y.o. female who is coming in today for the above mentioned reasons. Past Medical History is significant for: Hypertension, hyperlipidemia, morbid obesity, type 2 diabetes, OSA on CPAP, heart failure with preserved ejection fraction.  She continues to have elevated blood pressures.  Has some lower extremity edema.  Does not feel short of breath.  Interested in a GLP-1 for weight loss and diabetic management.   Past Medical/Surgical History: Past Medical History:  Diagnosis Date   Acquired dilation of ascending aorta and aortic root    38mm by 2D echo 03/2019   Allergy    Anxiety    Aortic valve disease    functionally bicuspid AV with fusion of the right and left coronary cusps with no AS by echo 03/2020   Arthritis    in back   Chest pain    neg cath 2016 after false positive myoview    CHF (congestive heart failure) (HCC)    Depression    Diabetes mellitus without complication (HCC)    type 2, no meds   Dyspnea    with exertion   GERD (gastroesophageal reflux disease)    Hyperglycemia    Hyperlipidemia    Hypertension    Joint pain    Knee pain    Low back pain    Migraine    Mitral regurgitation    mild to moderate by echo 02/2023   Mitral valve prolapse    Muscle cramps    PAT (paroxysmal atrial tachycardia)    by Ziopatch 02/2023   Pulmonary HTN (HCC)    mild with PASP by echo 03/2019 but on echo 03/2020   Sleep apnea    uses cpap    Past Surgical History:  Procedure Laterality Date   BACK SURGERY     BREAST BIOPSY     BREAST REDUCTION SURGERY Bilateral 01/04/2022   Procedure: BILATERAL BREAST REDUCTION;  Surgeon: Lowery Estefana RAMAN, DO;  Location: [facility] OR;  Service: Plastics;  Laterality: Bilateral;   CESAREAN SECTION     x 1   COLONOSCOPY     LEFT HEART CATHETERIZATION WITH CORONARY ANGIOGRAM N/A 07/08/2013    Procedure: LEFT HEART CATHETERIZATION WITH CORONARY ANGIOGRAM;  Surgeon: Maude JAYSON Emmer, MD;  Location: [facility] CATH LAB;  Service: Cardiovascular;  Laterality: N/A;   TUBAL LIGATION      Social History:  reports that she quit smoking about 27 years ago. Her smoking use included cigarettes. She has been exposed to tobacco smoke. She has never used smokeless tobacco. She reports current alcohol use. She reports that she does not use drugs.  Allergies: Allergies[1]  Family History:  Family History  Problem Relation Age of Onset   Hyperlipidemia Mother    Hypertension Mother    AAA (abdominal aortic aneurysm) Mother    Stroke Father    Hypertension Father    Hyperlipidemia Brother        x2   Hypertension Brother        x2   Liver cancer Maternal Aunt    Breast cancer Cousin    Colon cancer Cousin    Colon polyps Neg Hx    Esophageal cancer Neg Hx    Stomach cancer Neg Hx    Rectal cancer Neg Hx     Current Medications[2]  Review of Systems:  Negative unless indicated in HPI.  Physical Exam: Vitals:   12/16/23 0708 12/16/23 0718  BP: (!) 140/90 (!) 172/96  Pulse: 62   Temp: 98 F (36.7 C)   TempSrc: Oral   SpO2: 98%   Weight: 282 lb (127.9 kg)     Body mass index is 40.46 kg/m.   Physical Exam Vitals reviewed.  Constitutional:      Appearance: Normal appearance. She is obese.  HENT:     Head: Normocephalic and atraumatic.  Eyes:     Conjunctiva/sclera: Conjunctivae normal.  Cardiovascular:     Rate and Rhythm: Normal rate and regular rhythm.  Pulmonary:     Effort: Pulmonary effort is normal.     Breath sounds: Normal breath sounds.  Skin:    General: Skin is warm and dry.  Neurological:     General: No focal deficit present.     Mental Status: She is alert and oriented to person, place, and time.  Psychiatric:        Mood and Affect: Mood normal.        Behavior: Behavior normal.        Thought Content: Thought content normal.        Judgment:  Judgment normal.      Impression and Plan:  DM (diabetes mellitus) with complications (HCC) -     POCT glycosylated hemoglobin (Hb A1C) -     Mounjaro ; Inject 2.5 mg into the skin once a week.  Dispense: 2 mL; Refill: 0  Morbid obesity (HCC)  Primary hypertension -     Valsartan -hydroCHLOROthiazide ; Take 1 tablet by mouth daily.  Dispense: 90 tablet; Refill: 1  Mixed hyperlipidemia  Chronic diastolic CHF (congestive heart failure) (HCC)  OSA (obstructive sleep apnea)  -Diabetes is well-controlled with an A1c of 6.1. - Blood pressure remains elevated on 2 separate measurements.  Upon review of her medication she is only taking hydralazine  100 mg 3 times a day.  She is not taking losartan  or metoprolol .  I will add Diovan  HCT and she will return in 6 weeks for follow-up. - Start Mounjaro  2.5 mg weekly with plans to increase dose monthly as tolerated to help with diabetes and with weight loss. - Last lipids were at goal, continue atorvastatin  high dose. - Continue CPAP therapy. - She has some slight lower extremity edema have advised use of compression stockings and controlling blood pressure should help as well.   Time spent:33 minutes reviewing chart, interviewing and examining patient and formulating plan of care.     Jamie Theophilus Andrews, MD [facility]     [1] No Known Allergies [2]  Current Outpatient Medications:    acetaminophen  (TYLENOL ) 500 MG tablet, Take 1,000 mg by mouth every 6 (six) hours as needed for moderate pain., Disp: , Rfl:    atorvastatin  (LIPITOR) 80 MG tablet, TAKE 1 TABLET BY MOUTH EVERY DAY, Disp: 90 tablet, Rfl: 0   bismuth subsalicylate (PEPTO BISMOL) 262 MG/15ML suspension, Take 30 mLs by mouth every 6 (six) hours as needed for diarrhea or loose stools or indigestion., Disp: , Rfl:    Camphor-Eucalyptus-Menthol (VICKS VAPORUB EX), Apply 1 Application topically at bedtime as needed (congestion)., Disp: , Rfl:     Cholecalciferol (VITAMIN D3) 50 MCG (2000 UT) TABS, Take by mouth., Disp: , Rfl:    Dextromethorphan-GG-APAP (CORICIDIN HBP COLD/COUGH/FLU) 10-200-325 MG/15ML LIQD, Take 1 Dose by mouth daily as needed (congestion / cold symptoms)., Disp: , Rfl:    DM-Doxylamine-Acetaminophen  (CORICIDIN HBP NIGHTTIME COLD PO), Take 2  tablets by mouth daily as needed (congestion)., Disp: , Rfl:    furosemide  (LASIX ) 20 MG tablet, Take 1 tablet (20 mg total) by mouth 2 (two) times daily for 7 days., Disp: 15 tablet, Rfl: 0   gabapentin  (NEURONTIN ) 300 MG capsule, Take 300 mg by mouth daily., Disp: , Rfl:    glucosamine-chondroitin 500-400 MG tablet, Take 1 tablet by mouth 3 (three) times daily., Disp: , Rfl:    hydrALAZINE  (APRESOLINE ) 100 MG tablet, TAKE 1 TABLET BY MOUTH THREE TIMES A DAY, Disp: 270 tablet, Rfl: 0   meloxicam  (MOBIC ) 15 MG tablet, Take 15 mg by mouth daily., Disp: , Rfl:    Multiple Vitamin (MULTIVITAMIN WITH MINERALS) TABS tablet, Take 1 tablet by mouth daily., Disp: , Rfl:    NURTEC 75 MG TBDP, TAKE 1 TABLET (75 MG TOTAL) BY MOUTH DAILY AS NEEDED, Disp: 8 tablet, Rfl: 5   pantoprazole  (PROTONIX ) 40 MG tablet, Take 1 tablet (40 mg total) by mouth daily., Disp: 90 tablet, Rfl: 3   potassium chloride  (KLOR-CON ) 10 MEQ tablet, TAKE 1 TABLET BY MOUTH EVERY DAY, Disp: 90 tablet, Rfl: 0   tirzepatide  (MOUNJARO ) 2.5 MG/0.5ML Pen, Inject 2.5 mg into the skin once a week., Disp: 2 mL, Rfl: 0   topiramate  (TOPAMAX ) 25 MG capsule, Take 50 mg by mouth at bedtime., Disp: , Rfl:    valsartan -hydrochlorothiazide  (DIOVAN -HCT) 160-25 MG tablet, Take 1 tablet by mouth daily., Disp: 90 tablet, Rfl: 1  Current Facility-Administered Medications:    0.9 %  sodium chloride  infusion, 500 mL, Intravenous, Once, Aneita Gwendlyn DASEN, MD

## 2023-12-19 ENCOUNTER — Other Ambulatory Visit: Payer: Self-pay | Admitting: Internal Medicine

## 2023-12-19 DIAGNOSIS — E785 Hyperlipidemia, unspecified: Secondary | ICD-10-CM

## 2023-12-20 ENCOUNTER — Telehealth: Payer: Self-pay

## 2023-12-20 DIAGNOSIS — I10 Essential (primary) hypertension: Secondary | ICD-10-CM

## 2023-12-20 NOTE — Telephone Encounter (Signed)
 Spoke with the patient and informed her the message will be sent to PCP for review on 12/22.

## 2023-12-20 NOTE — Telephone Encounter (Unsigned)
 Copied from CRM #[id number]. Topic: Clinical - Medical Advice >> Dec 20, 2023  9:04 AM Robinson H wrote: Reason for CRM: Was put on new blood pressure medication valsartan -hydrochlorothiazide  (DIOVAN -HCT) 160-25 MG tablet, started taking yesterday and all night she was up with cramps in both legs from toes to ankles. States she's not going to take medication today, and wants something different if possible. Also wants to know if there are samples she can try before paying for a complete supply of a new medication.  Zamia [phone]

## 2023-12-23 ENCOUNTER — Other Ambulatory Visit

## 2023-12-23 DIAGNOSIS — I10 Essential (primary) hypertension: Secondary | ICD-10-CM | POA: Diagnosis not present

## 2023-12-23 NOTE — Telephone Encounter (Signed)
 Patient returned call to [facility]. Patient would like a return call to explain labwork that she needing.

## 2023-12-23 NOTE — Telephone Encounter (Signed)
Lab appointment and orders placed. 

## 2023-12-23 NOTE — Telephone Encounter (Signed)
 Left message on machine for patient to return our call

## 2023-12-24 LAB — COMPREHENSIVE METABOLIC PANEL WITH GFR
ALT: 124 U/L — ABNORMAL HIGH (ref 3–35)
AST: 60 U/L — ABNORMAL HIGH (ref 5–37)
Albumin: 4.3 g/dL (ref 3.5–5.2)
Alkaline Phosphatase: 96 U/L (ref 39–117)
BUN: 17 mg/dL (ref 6–23)
CO2: 29 meq/L (ref 19–32)
Calcium: 9.3 mg/dL (ref 8.4–10.5)
Chloride: 107 meq/L (ref 96–112)
Creatinine, Ser: 0.77 mg/dL (ref 0.40–1.20)
GFR: 82.93 mL/min
Glucose, Bld: 133 mg/dL — ABNORMAL HIGH (ref 70–99)
Potassium: 3.8 meq/L (ref 3.5–5.1)
Sodium: 143 meq/L (ref 135–145)
Total Bilirubin: 0.5 mg/dL (ref 0.2–1.2)
Total Protein: 7.4 g/dL (ref 6.0–8.3)

## 2023-12-24 LAB — MAGNESIUM: Magnesium: 2.2 mg/dL (ref 1.5–2.5)

## 2023-12-30 NOTE — Progress Notes (Signed)
.  Declined SDOH

## 2023-12-31 ENCOUNTER — Ambulatory Visit: Payer: Self-pay | Admitting: Internal Medicine

## 2024-01-09 NOTE — Progress Notes (Signed)
 The patient attended a screening event on 11/18/2023 and 12/16/2023 where her screening results were a BP of 166/93 on 11/17 and 194/113 . At the events the patient noted Dr. Theophilus as her PCP, has private insurance, and does not smoke. Patient declined SDOH question at the time of the event.  Per chart review patient has Dr. Tully [name]. Jamie [name] as her PCP, has [employer], and does not have any SDOH needs. Chart review also indicates the patient's last appt with her PCP was on 12/16/2023 where her elevated blood pressure was discussed. Patient also had a recent health equity f/u done less than 30 days ago on 12/12/2023. During that f/u patient had a PCP, had insurance, and no SDOH needs. No additional Health equity team support indicated at this time.

## 2024-01-21 ENCOUNTER — Ambulatory Visit: Payer: Self-pay

## 2024-01-21 ENCOUNTER — Telehealth: Payer: Self-pay

## 2024-01-21 ENCOUNTER — Telehealth: Payer: Self-pay | Admitting: Internal Medicine

## 2024-01-21 MED ORDER — MELOXICAM 15 MG PO TABS: 15.0000 mg | ORAL_TABLET | Freq: Every day | ORAL | 0 refills | Status: AC

## 2024-01-21 NOTE — Telephone Encounter (Signed)
 Copied from CRM [id number]. Topic: Clinical - Pink Word Triage >> Jan 21, 2024  8:51 AM Tiffini S wrote: Dawne Word that prompted message to Nurse Triage: patient is having back/ knee pain have been taking Tylenol  and was told to stop by pcp- wants to know what OTC medication can be taken- will be traveling soon and need information for recommendation   Please call the patient back at [phone]  to discuss   Please send message to Nurse Triage by clicking Send Message and Close CRM.

## 2024-01-21 NOTE — Telephone Encounter (Signed)
 FYI Only or Action Required?: Action required by provider: clinical question for provider. Wanting to know what to take for chronic pain instead of tylenol .   Patient was last seen in primary care on 12/16/2023 by Theophilus Andrews, Tully GRADE, MD.  Called Nurse Triage reporting Advice Only.  Symptoms began Years.  Interventions attempted: Rest, hydration, or home remedies.  Symptoms are: unchanged.  Triage Disposition: Call PCP When Office is Open  Patient/caregiver understands and will follow disposition?: Yes    Chronic back and knee and headache pain for years, no changes from baseline. 6/10 chronic pain. Was told by PCP to not take tylenol  and stop drinking d/t elevated liver enzymes on 12/30. Pt wanting to know what she can take. Advised if no kidney issues she could take ibuprofen . Forwarding to office for office for review.    Message from Tiffini S sent at 01/21/2024  8:55 AM EST  Reason for Triage: patient is having back/ knee pain have been taking Tylenol  and was told to stop by pcp- wants to know what OTC medication can be taken- will be traveling soon and need information for recommendation  Please call the patient back at [phone]  to discuss   Reason for Disposition  [1] Caller requesting NON-URGENT health information AND [2] PCP's office is the best resource  Answer Assessment - Initial Assessment Questions 1. REASON FOR CALL: What is the main reason for your call? or How can I best help you?     Was told by PCP to not take tylenol  and stop drinking d/t elevated liver enzymes on 12/30. Pt wanting to know what she can take for chronic back, knee and headache pain.  2. SYMPTOMS : Do you have any symptoms?      Chronic back, knee and headache pain pt has had for years. Unchanged from baseline.  3. OTHER QUESTIONS: Do you have any other questions?     Denies  Protocols used: Information Only Call - No Triage-A-AH

## 2024-01-21 NOTE — Telephone Encounter (Signed)
 Rx sent.

## 2024-01-21 NOTE — Telephone Encounter (Signed)
 Copied from CRM [id number]. Topic: Clinical - Pink [name] Triage >> Jan 21, 2024  8:51 AM Tiffini S wrote: Jamie [name] that prompted message to Nurse Triage: patient is having back/ knee pain have been taking Tylenol  and was told to stop by pcp- wants to know what OTC medication can be taken- will be traveling soon and need information for recommendation   Please call the patient back at [phone]  to discuss   Please send message to Nurse Triage by clicking Send Message and Close CRM. >> Jan 21, 2024  8:55 AM Tiffini S wrote: Reason for Triage: patient is having back/ knee pain have been taking Tylenol  and was told to stop by pcp- wants to know what OTC medication can be taken- will be traveling soon and need information for recommendation   Please call the patient back at [phone]  to discuss

## 2024-01-27 ENCOUNTER — Ambulatory Visit: Admitting: Internal Medicine

## 2024-01-28 ENCOUNTER — Other Ambulatory Visit: Payer: Self-pay | Admitting: Internal Medicine

## 2024-01-28 DIAGNOSIS — E118 Type 2 diabetes mellitus with unspecified complications: Secondary | ICD-10-CM

## 2024-01-28 NOTE — Telephone Encounter (Unsigned)
 Copied from CRM #[id number]. Topic: Clinical - Medication Refill >> Jan 28, 2024  3:36 PM [name] wrote: Medication: tirzepatide  (MOUNJARO ) 2.5 MG/0.5ML Pen   Has the patient contacted their pharmacy? Yes (Agent: If no, request that the patient contact the pharmacy for the refill. If patient does not wish to contact the pharmacy document the reason why and proceed with request.) (Agent: If yes, when and what did the pharmacy advise?)  This is the patient's preferred pharmacy:  [facility] [phone] [address], [place] - [address] [place] [address] Phone: [phone] Fax: [phone]  Is this the correct pharmacy for this prescription? Yes If no, delete pharmacy and type the correct one.   Has the prescription been filled recently? No  Is the patient out of the medication? Yes  Has the patient been seen for an appointment in the last year OR does the patient have an upcoming appointment? Yes  Can we respond through MyChart? No  Agent: Please be advised that Rx refills may take up to 3 business days. We ask that you follow-up with your pharmacy.

## 2024-01-28 NOTE — Telephone Encounter (Signed)
 Patient would like to increase her Mounjaro .  She states that it causes constipation and would like to know what she can do?

## 2024-01-29 ENCOUNTER — Other Ambulatory Visit: Payer: Self-pay | Admitting: Internal Medicine

## 2024-01-29 DIAGNOSIS — E118 Type 2 diabetes mellitus with unspecified complications: Secondary | ICD-10-CM

## 2024-01-30 ENCOUNTER — Ambulatory Visit: Admitting: Internal Medicine

## 2024-01-30 ENCOUNTER — Encounter: Payer: Self-pay | Admitting: Internal Medicine

## 2024-01-30 VITALS — BP 157/98 | HR 65 | Temp 98.3°F | Wt 278.3 lb

## 2024-01-30 DIAGNOSIS — E782 Mixed hyperlipidemia: Secondary | ICD-10-CM | POA: Diagnosis not present

## 2024-01-30 DIAGNOSIS — I10 Essential (primary) hypertension: Secondary | ICD-10-CM | POA: Diagnosis not present

## 2024-01-30 DIAGNOSIS — R748 Abnormal levels of other serum enzymes: Secondary | ICD-10-CM | POA: Diagnosis not present

## 2024-01-30 DIAGNOSIS — E1169 Type 2 diabetes mellitus with other specified complication: Secondary | ICD-10-CM

## 2024-01-30 DIAGNOSIS — Z7985 Long-term (current) use of injectable non-insulin antidiabetic drugs: Secondary | ICD-10-CM | POA: Diagnosis not present

## 2024-01-30 MED ORDER — MOUNJARO 5 MG/0.5ML ~~LOC~~ SOAJ: 5.0000 mg | SUBCUTANEOUS | 0 refills | Status: AC

## 2024-01-30 NOTE — Assessment & Plan Note (Signed)
 Recent A1c was well-controlled at 6.1.  Increase Mounjaro  dosing to potentiate weight loss.

## 2024-01-30 NOTE — Assessment & Plan Note (Signed)
 Check lipids next visit, continue statin.

## 2024-01-30 NOTE — Assessment & Plan Note (Signed)
-  Discussed healthy lifestyle, including increased physical activity and better food choices to promote weight loss. - Mounjaro  should help with weight loss.

## 2024-01-30 NOTE — Assessment & Plan Note (Signed)
 Not well-controlled.  Placed referral to hypertension clinic.

## 2024-01-30 NOTE — Progress Notes (Signed)
 "    Established Patient Office Visit     CC/Reason for Visit: Follow-up blood pressure  HPI: Jamie [name] is a 64 y.o. female who is coming in today for the above mentioned reasons. Past Medical History is significant for: Hypertension, hyperlipidemia, type 2 diabetes, morbid obesity, OSA on CPAP, heart failure with preserved ejection fraction.  We have attempted multiple different antihypertensive regimens and to all of them she has had some issues.  Did not tolerate beta-blocker or ACE inhibitor.  Had lower extremity edema with calcium  channel blocker, had severe cramps after taking only 1 dose of Diovan  HCT.  Currently she is only taking hydralazine  100 mg 3 times a day.  She is requesting an increase in her Mounjaro  dosing.   Past Medical/Surgical History: Past Medical History:  Diagnosis Date   Acquired dilation of ascending aorta and aortic root    38mm by 2D echo 03/2019   Allergy    Anxiety    Aortic valve disease    functionally bicuspid AV with fusion of the right and left coronary cusps with no AS by echo 03/2020   Arthritis    in back   Chest pain    neg cath 2016 after false positive myoview    CHF (congestive heart failure) (HCC)    Depression    Diabetes mellitus without complication (HCC)    type 2, no meds   Dyspnea    with exertion   GERD (gastroesophageal reflux disease)    Hyperglycemia    Hyperlipidemia    Hypertension    Joint pain    Knee pain    Low back pain    Migraine    Mitral regurgitation    mild to moderate by echo 02/2023   Mitral valve prolapse    Muscle cramps    PAT (paroxysmal atrial tachycardia)    by Ziopatch 02/2023   Pulmonary HTN (HCC)    mild with PASP by echo 03/2019 but on echo 03/2020   Sleep apnea    uses cpap    Past Surgical History:  Procedure Laterality Date   BACK SURGERY     BREAST BIOPSY     BREAST REDUCTION SURGERY Bilateral 01/04/2022   Procedure: BILATERAL BREAST REDUCTION;  Surgeon:  Lowery Estefana RAMAN, DO;  Location: [facility] OR;  Service: Plastics;  Laterality: Bilateral;   CESAREAN SECTION     x 1   COLONOSCOPY     LEFT HEART CATHETERIZATION WITH CORONARY ANGIOGRAM N/A 07/08/2013   Procedure: LEFT HEART CATHETERIZATION WITH CORONARY ANGIOGRAM;  Surgeon: Maude JAYSON Emmer, MD;  Location: [facility] CATH LAB;  Service: Cardiovascular;  Laterality: N/A;   TUBAL LIGATION      Social History:  reports that she quit smoking about 27 years ago. Her smoking use included cigarettes. She has been exposed to tobacco smoke. She has never used smokeless tobacco. She reports current alcohol use. She reports that she does not use drugs.  Allergies: Allergies[1]  Family History:  Family History  Problem Relation Age of Onset   Hyperlipidemia Mother    Hypertension Mother    AAA (abdominal aortic aneurysm) Mother    Stroke Father    Hypertension Father    Hyperlipidemia Brother        x2   Hypertension Brother        x2   Liver cancer Maternal Aunt    Breast cancer Cousin    Colon cancer Cousin    Colon polyps Neg Hx  Esophageal cancer Neg Hx    Stomach cancer Neg Hx    Rectal cancer Neg Hx     Current Medications[2]  Review of Systems:  Negative unless indicated in HPI.   Physical Exam: Vitals:   01/30/24 0714 01/30/24 0718  BP: (!) 159/97 (!) 157/98  Pulse: 65   Temp: 98.3 F (36.8 C)   TempSrc: Oral   SpO2: 95%   Weight: 278 lb 4.8 oz (126.2 kg)     Body mass index is 39.93 kg/m.   Physical Exam Vitals reviewed.  Constitutional:      Appearance: Normal appearance. She is obese.  HENT:     Head: Normocephalic and atraumatic.  Eyes:     Conjunctiva/sclera: Conjunctivae normal.  Cardiovascular:     Rate and Rhythm: Normal rate and regular rhythm.  Pulmonary:     Effort: Pulmonary effort is normal.     Breath sounds: Normal breath sounds.  Skin:    General: Skin is warm and dry.  Neurological:     General: No focal deficit present.     Mental  Status: She is alert and oriented to person, place, and time.  Psychiatric:        Mood and Affect: Mood normal.        Behavior: Behavior normal.        Thought Content: Thought content normal.        Judgment: Judgment normal.      Impression and Plan:  Abnormal liver enzymes -     COMPLETE METABOLIC PANEL WITHOUT GFR; Future  Morbid obesity (HCC) Assessment & Plan: -Discussed healthy lifestyle, including increased physical activity and better food choices to promote weight loss. - Mounjaro  should help with weight loss.   Primary hypertension Assessment & Plan: Not well-controlled.  Placed referral to hypertension clinic.  Orders: -     Amb[facility] referral to [facility]  Mixed hyperlipidemia Assessment & Plan: Check lipids next visit, continue statin.   Type 2 diabetes mellitus with other specified complication, without long-term current use of insulin  (HCC) Assessment & Plan: Recent A1c was well-controlled at 6.1.  Increase Mounjaro  dosing to potentiate weight loss.  Orders: -     Mounjaro ; Inject 5 mg into the skin once a week.  Dispense: 2 mL; Refill: 0     Time spent:32 minutes reviewing chart, interviewing and examining patient and formulating plan of care.     [name], MD [facility]     [1] No Known Allergies [2]  Current Outpatient Medications:    acetaminophen  (TYLENOL ) 500 MG tablet, Take 1,000 mg by mouth every 6 (six) hours as needed for moderate pain., Disp: , Rfl:    atorvastatin  (LIPITOR) 80 MG tablet, TAKE 1 TABLET BY MOUTH EVERY DAY, Disp: 90 tablet, Rfl: 0   bismuth subsalicylate (PEPTO BISMOL) 262 MG/15ML suspension, Take 30 mLs by mouth every 6 (six) hours as needed for diarrhea or loose stools or indigestion., Disp: , Rfl:    Camphor-Eucalyptus-Menthol (VICKS VAPORUB EX), Apply 1 Application topically at bedtime as needed (congestion)., Disp: , Rfl:    Cholecalciferol (VITAMIN  D3) 50 MCG (2000 UT) TABS, Take by mouth., Disp: , Rfl:    Dextromethorphan-GG-APAP (CORICIDIN HBP COLD/COUGH/FLU) 10-200-325 MG/15ML LIQD, Take 1 Dose by mouth daily as needed (congestion / cold symptoms)., Disp: , Rfl:    DM-Doxylamine-Acetaminophen  (CORICIDIN HBP NIGHTTIME COLD PO), Take 2 tablets by mouth daily as needed (congestion)., Disp: , Rfl:    furosemide  (  LASIX ) 20 MG tablet, Take 1 tablet (20 mg total) by mouth 2 (two) times daily for 7 days., Disp: 15 tablet, Rfl: 0   gabapentin  (NEURONTIN ) 300 MG capsule, Take 300 mg by mouth daily., Disp: , Rfl:    glucosamine-chondroitin 500-400 MG tablet, Take 1 tablet by mouth 3 (three) times daily., Disp: , Rfl:    hydrALAZINE  (APRESOLINE ) 100 MG tablet, TAKE 1 TABLET BY MOUTH THREE TIMES A DAY, Disp: 270 tablet, Rfl: 0   meloxicam  (MOBIC ) 15 MG tablet, Take 1 tablet (15 mg total) by mouth daily., Disp: 10 tablet, Rfl: 0   Multiple Vitamin (MULTIVITAMIN WITH MINERALS) TABS tablet, Take 1 tablet by mouth daily., Disp: , Rfl:    NURTEC 75 MG TBDP, TAKE 1 TABLET (75 MG TOTAL) BY MOUTH DAILY AS NEEDED, Disp: 8 tablet, Rfl: 5   pantoprazole  (PROTONIX ) 40 MG tablet, Take 1 tablet (40 mg total) by mouth daily., Disp: 90 tablet, Rfl: 3   potassium chloride  (KLOR-CON ) 10 MEQ tablet, TAKE 1 TABLET BY MOUTH EVERY DAY, Disp: 90 tablet, Rfl: 0   tirzepatide  (MOUNJARO ) 5 MG/0.5ML Pen, Inject 5 mg into the skin once a week., Disp: 2 mL, Rfl: 0   topiramate  (TOPAMAX ) 25 MG capsule, Take 50 mg by mouth at bedtime., Disp: , Rfl:   Current Facility-Administered Medications:    0.9 %  sodium chloride  infusion, 500 mL, Intravenous, Once, Aneita Gwendlyn DASEN, MD  "

## 2024-02-05 ENCOUNTER — Ambulatory Visit (HOSPITAL_COMMUNITY)
Admission: RE | Admit: 2024-02-05 | Discharge: 2024-02-05 | Disposition: A | Source: Ambulatory Visit | Attending: Cardiology | Admitting: Cardiology

## 2024-02-05 DIAGNOSIS — I272 Pulmonary hypertension, unspecified: Secondary | ICD-10-CM | POA: Diagnosis not present

## 2024-02-05 DIAGNOSIS — I341 Nonrheumatic mitral (valve) prolapse: Secondary | ICD-10-CM | POA: Diagnosis not present

## 2024-02-05 LAB — ECHOCARDIOGRAM COMPLETE
Area-P 1/2: 3.08 cm2
S' Lateral: 2.93 cm

## 2024-02-05 MED ORDER — PERFLUTREN LIPID MICROSPHERE
1.0000 mL | INTRAVENOUS | Status: AC | PRN
  Administered 2024-02-05: 2 mL via INTRAVENOUS

## 2024-02-07 ENCOUNTER — Other Ambulatory Visit: Payer: Self-pay | Admitting: Internal Medicine

## 2024-02-07 DIAGNOSIS — Z1231 Encounter for screening mammogram for malignant neoplasm of breast: Secondary | ICD-10-CM

## 2024-02-12 ENCOUNTER — Other Ambulatory Visit

## 2024-03-16 ENCOUNTER — Institutional Professional Consult (permissible substitution) (HOSPITAL_BASED_OUTPATIENT_CLINIC_OR_DEPARTMENT_OTHER): Admitting: Family

## 2024-04-14 ENCOUNTER — Ambulatory Visit
